# Patient Record
Sex: Female | Born: 1950
Health system: Southern US, Community
[De-identification: ages and names within clinical notes are randomized; demographics above are authoritative.]

## PROBLEM LIST (undated history)

## (undated) ENCOUNTER — Emergency Department (HOSPITAL_COMMUNITY): Admission: EM | Payer: Self-pay | Source: Home / Self Care

## (undated) DIAGNOSIS — I10 Essential (primary) hypertension: Secondary | ICD-10-CM

## (undated) DIAGNOSIS — H9190 Unspecified hearing loss, unspecified ear: Secondary | ICD-10-CM

## (undated) DIAGNOSIS — E559 Vitamin D deficiency, unspecified: Secondary | ICD-10-CM

## (undated) DIAGNOSIS — F32A Depression, unspecified: Secondary | ICD-10-CM

## (undated) DIAGNOSIS — F329 Major depressive disorder, single episode, unspecified: Secondary | ICD-10-CM

## (undated) DIAGNOSIS — C50919 Malignant neoplasm of unspecified site of unspecified female breast: Secondary | ICD-10-CM

## (undated) DIAGNOSIS — Z923 Personal history of irradiation: Secondary | ICD-10-CM

## (undated) DIAGNOSIS — J449 Chronic obstructive pulmonary disease, unspecified: Secondary | ICD-10-CM

## (undated) DIAGNOSIS — F0390 Unspecified dementia without behavioral disturbance: Secondary | ICD-10-CM

## (undated) DIAGNOSIS — F05 Delirium due to known physiological condition: Secondary | ICD-10-CM

## (undated) DIAGNOSIS — E785 Hyperlipidemia, unspecified: Secondary | ICD-10-CM

## (undated) DIAGNOSIS — F419 Anxiety disorder, unspecified: Secondary | ICD-10-CM

## (undated) DIAGNOSIS — E119 Type 2 diabetes mellitus without complications: Secondary | ICD-10-CM

## (undated) HISTORY — PX: INNER EAR SURGERY: SHX679

## (undated) HISTORY — PX: SPINE SURGERY: SHX786

## (undated) HISTORY — DX: Chronic obstructive pulmonary disease, unspecified: J44.9

## (undated) HISTORY — DX: Essential (primary) hypertension: I10

## (undated) HISTORY — DX: Malignant neoplasm of unspecified site of unspecified female breast: C50.919

## (undated) HISTORY — DX: Type 2 diabetes mellitus without complications: E11.9

## (undated) HISTORY — PX: BACK SURGERY: SHX140

## (undated) HISTORY — DX: Vitamin D deficiency, unspecified: E55.9

## (undated) HISTORY — PX: CARPAL TUNNEL RELEASE: SHX101

## (undated) HISTORY — PX: EYE SURGERY: SHX253

## (undated) HISTORY — PX: GALLBLADDER SURGERY: SHX652

## (undated) HISTORY — PX: BREAST SURGERY: SHX581

## (undated) HISTORY — DX: Hyperlipidemia, unspecified: E78.5

## (undated) HISTORY — PX: CERVICAL SPINE SURGERY: SHX589

---

## 2000-09-30 ENCOUNTER — Emergency Department (HOSPITAL_COMMUNITY): Admission: EM | Admit: 2000-09-30 | Discharge: 2000-10-01 | Payer: Self-pay | Admitting: Emergency Medicine

## 2002-01-24 ENCOUNTER — Emergency Department (HOSPITAL_COMMUNITY): Admission: EM | Admit: 2002-01-24 | Discharge: 2002-01-24 | Payer: Self-pay | Admitting: *Deleted

## 2002-02-26 ENCOUNTER — Inpatient Hospital Stay (HOSPITAL_COMMUNITY): Admission: RE | Admit: 2002-02-26 | Discharge: 2002-02-28 | Payer: Self-pay | Admitting: Neurosurgery

## 2002-02-26 ENCOUNTER — Encounter: Payer: Self-pay | Admitting: Neurosurgery

## 2002-09-19 ENCOUNTER — Emergency Department (HOSPITAL_COMMUNITY): Admission: EM | Admit: 2002-09-19 | Discharge: 2002-09-20 | Payer: Self-pay | Admitting: Internal Medicine

## 2002-09-20 ENCOUNTER — Encounter: Payer: Self-pay | Admitting: Internal Medicine

## 2002-09-21 ENCOUNTER — Inpatient Hospital Stay (HOSPITAL_COMMUNITY): Admission: AD | Admit: 2002-09-21 | Discharge: 2002-09-23 | Payer: Self-pay | Admitting: Emergency Medicine

## 2002-09-21 ENCOUNTER — Encounter: Payer: Self-pay | Admitting: *Deleted

## 2002-09-23 ENCOUNTER — Encounter: Payer: Self-pay | Admitting: *Deleted

## 2004-07-03 ENCOUNTER — Encounter: Admission: RE | Admit: 2004-07-03 | Discharge: 2004-07-03 | Payer: Self-pay | Admitting: General Surgery

## 2004-07-05 ENCOUNTER — Emergency Department (HOSPITAL_COMMUNITY): Admission: EM | Admit: 2004-07-05 | Discharge: 2004-07-05 | Payer: Self-pay | Admitting: Family Medicine

## 2004-08-02 ENCOUNTER — Emergency Department (HOSPITAL_COMMUNITY): Admission: EM | Admit: 2004-08-02 | Discharge: 2004-08-02 | Payer: Self-pay | Admitting: Emergency Medicine

## 2005-01-10 ENCOUNTER — Emergency Department (HOSPITAL_COMMUNITY): Admission: EM | Admit: 2005-01-10 | Discharge: 2005-01-10 | Payer: Self-pay | Admitting: Emergency Medicine

## 2005-03-04 ENCOUNTER — Encounter: Admission: RE | Admit: 2005-03-04 | Discharge: 2005-03-04 | Payer: Self-pay | Admitting: General Surgery

## 2005-03-07 ENCOUNTER — Encounter: Admission: RE | Admit: 2005-03-07 | Discharge: 2005-03-07 | Payer: Self-pay | Admitting: General Surgery

## 2005-03-15 ENCOUNTER — Encounter: Admission: RE | Admit: 2005-03-15 | Discharge: 2005-03-15 | Payer: Self-pay | Admitting: General Surgery

## 2005-03-15 ENCOUNTER — Encounter (INDEPENDENT_AMBULATORY_CARE_PROVIDER_SITE_OTHER): Payer: Self-pay | Admitting: *Deleted

## 2005-03-26 ENCOUNTER — Encounter: Admission: RE | Admit: 2005-03-26 | Discharge: 2005-03-26 | Payer: Self-pay | Admitting: General Surgery

## 2005-03-29 ENCOUNTER — Encounter: Admission: RE | Admit: 2005-03-29 | Discharge: 2005-03-29 | Payer: Self-pay | Admitting: General Surgery

## 2005-04-01 ENCOUNTER — Encounter (INDEPENDENT_AMBULATORY_CARE_PROVIDER_SITE_OTHER): Payer: Self-pay | Admitting: General Surgery

## 2005-04-01 ENCOUNTER — Ambulatory Visit (HOSPITAL_COMMUNITY): Admission: RE | Admit: 2005-04-01 | Discharge: 2005-04-01 | Payer: Self-pay | Admitting: General Surgery

## 2005-04-01 ENCOUNTER — Encounter (INDEPENDENT_AMBULATORY_CARE_PROVIDER_SITE_OTHER): Payer: Self-pay | Admitting: *Deleted

## 2005-04-01 ENCOUNTER — Encounter: Admission: RE | Admit: 2005-04-01 | Discharge: 2005-04-01 | Payer: Self-pay | Admitting: General Surgery

## 2005-04-01 ENCOUNTER — Ambulatory Visit (HOSPITAL_BASED_OUTPATIENT_CLINIC_OR_DEPARTMENT_OTHER): Admission: RE | Admit: 2005-04-01 | Discharge: 2005-04-01 | Payer: Self-pay | Admitting: General Surgery

## 2005-04-17 ENCOUNTER — Encounter (INDEPENDENT_AMBULATORY_CARE_PROVIDER_SITE_OTHER): Payer: Self-pay | Admitting: Specialist

## 2005-04-17 ENCOUNTER — Ambulatory Visit (HOSPITAL_BASED_OUTPATIENT_CLINIC_OR_DEPARTMENT_OTHER): Admission: RE | Admit: 2005-04-17 | Discharge: 2005-04-17 | Payer: Self-pay | Admitting: General Surgery

## 2005-04-17 ENCOUNTER — Ambulatory Visit (HOSPITAL_COMMUNITY): Admission: RE | Admit: 2005-04-17 | Discharge: 2005-04-17 | Payer: Self-pay | Admitting: General Surgery

## 2005-04-25 ENCOUNTER — Ambulatory Visit: Payer: Self-pay | Admitting: Oncology

## 2005-05-17 ENCOUNTER — Ambulatory Visit: Admission: RE | Admit: 2005-05-17 | Discharge: 2005-07-26 | Payer: Self-pay | Admitting: Psychiatry

## 2005-05-21 ENCOUNTER — Encounter: Admission: RE | Admit: 2005-05-21 | Discharge: 2005-05-21 | Payer: Self-pay | Admitting: Radiation Oncology

## 2005-12-26 ENCOUNTER — Ambulatory Visit (HOSPITAL_COMMUNITY): Admission: RE | Admit: 2005-12-26 | Discharge: 2005-12-26 | Payer: Self-pay | Admitting: Family Medicine

## 2005-12-26 ENCOUNTER — Encounter: Admission: RE | Admit: 2005-12-26 | Discharge: 2006-01-28 | Payer: Self-pay

## 2006-12-17 ENCOUNTER — Encounter: Admission: RE | Admit: 2006-12-17 | Discharge: 2006-12-17 | Payer: Self-pay | Admitting: Hematology and Oncology

## 2007-05-06 ENCOUNTER — Ambulatory Visit (HOSPITAL_COMMUNITY): Admission: RE | Admit: 2007-05-06 | Discharge: 2007-05-06 | Payer: Self-pay | Admitting: Neurosurgery

## 2007-06-02 ENCOUNTER — Encounter: Admission: RE | Admit: 2007-06-02 | Discharge: 2007-06-02 | Payer: Self-pay | Admitting: Neurosurgery

## 2007-12-18 ENCOUNTER — Encounter: Admission: RE | Admit: 2007-12-18 | Discharge: 2007-12-18 | Payer: Self-pay

## 2008-12-21 ENCOUNTER — Encounter: Admission: RE | Admit: 2008-12-21 | Discharge: 2008-12-21 | Payer: Self-pay | Admitting: Obstetrics and Gynecology

## 2010-01-10 ENCOUNTER — Encounter: Admission: RE | Admit: 2010-01-10 | Discharge: 2010-01-10 | Payer: Self-pay | Admitting: Obstetrics and Gynecology

## 2010-11-04 ENCOUNTER — Encounter: Payer: Self-pay | Admitting: Hematology and Oncology

## 2011-01-30 ENCOUNTER — Other Ambulatory Visit: Payer: Self-pay | Admitting: Obstetrics and Gynecology

## 2011-01-30 DIAGNOSIS — Z853 Personal history of malignant neoplasm of breast: Secondary | ICD-10-CM

## 2011-02-05 ENCOUNTER — Ambulatory Visit
Admission: RE | Admit: 2011-02-05 | Discharge: 2011-02-05 | Disposition: A | Discharge status: Home / Self Care | Payer: PRIVATE HEALTH INSURANCE | Source: Ambulatory Visit | Attending: Obstetrics and Gynecology | Admitting: Obstetrics and Gynecology

## 2011-02-05 DIAGNOSIS — Z853 Personal history of malignant neoplasm of breast: Secondary | ICD-10-CM

## 2011-03-01 NOTE — Op Note (Signed)
Jamie [name], Jamie [name]                 ACCOUNT NO.:  [id number]   MEDICAL RECORD NO.:  [id number]          PATIENT TYPE:  AMB   LOCATION:  DSC                          FACILITY:  [facility]   PHYSICIAN:  Rose Phi. Maple Hudson, M.D.   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   OPERATION:  Left partial mastectomy with needle localization and specimen  for mammogram.   SURGEON:  Francina Ames, M.D.   ANESTHESIA:  General.   OPERATIVE PROCEDURE:  Prior to coming to the operating room, two localizing  wires, one anterior and one posterior, had been placed to identify the deep  and anterior margins of the calcifications which had been DCIS on biopsy.   After suitable general anesthesia was induced, the patient was placed in the  supine position and the left breast prepped and draped in the usual fashion.  A curved incision between the two localizing wires, which came from a  lateral position, for the left breast was then outlined and then I exposed  both wires into the incision and with retraction excised the wires and  surrounding tissue.  Specimen was oriented for the pathologist.  It was  submitted to the radiologist for specimen mammogram.   The specimen mammogram confirmed the removal of the microcalcifications.   With good hemostasis, incision was closed in a single layer with  subcuticular 4-0 Monocryl and Steri-Strips.  Dressing applied.  The patient  was transferred to the recovery room in satisfactory condition having  tolerated the procedure well.       PRY/MEDQ  D:  04/01/2005  T:  04/01/2005  Job:  [id number]

## 2011-03-01 NOTE — H&P (Signed)
NAME:  Jamie Jamie [name], Jamie Jamie [name]                           ACCOUNT NO.:  [id number]   MEDICAL RECORD NO.:  [id number]                   PATIENT TYPE:  INP   LOCATION:  A321                                 FACILITY:  [facility]   PHYSICIAN:  Sarita Bottom, M.D.                  DATE OF BIRTH:  Aug 24, 1951   DATE OF ADMISSION:  09/21/2002  DATE OF DISCHARGE:                                HISTORY & PHYSICAL   PRIMARY PHYSICIAN:  Paulita Cradle, M.D., [facility]   CHIEF COMPLAINT:  My blood sugar is high.   HISTORY OF PRESENT ILLNESS:  The patient is Jamie [name] 60 year old lady with Jamie [name]  history of hypertension and diabetes mellitus.  She was recently started on  insulin about Jamie [name] week ago.  She said she was apparently well until three days  ago when she developed dysuria and also low-back pain.  She came to the  emergency room and she was given antibiotics for possible urinary tract  infection and also given Vicodin for pain and then discharged home; however,  she was supposed to see her primary M.D. today in his office and his blood  sugar was noted to be very high and his urine was also positive for ketones,  so the patient was therefore referred to [facility] for evaluation  for diabetic ketoacidosis.   REVIEW OF SYSTEMS:  GENERAL:  She admits to weakness.  Denies any fever.  RESPIRATORY:  She had noticed Jamie [name] cough productive of yellowish to whitish  phlegm.  CV:  Denies any chest pain or palpitations.  ABDOMEN:  Admits to  nausea and vomiting this morning.  CNS:  Denies any dizziness.  EXTREMITIES:  Denies any edema.  BACK:  Complains of severe back pain.   PAST MEDICAL HISTORY:  Significant for hypertension and diabetes mellitus.  She was started on insulin over Jamie [name] week ago, Jamie [name] few weeks ago.  She also has Jamie [name]  history of bursitis of the right shoulder involving the skin on the cervical  disk surgery.   MEDICATIONS:  1. Norvasc.  She does not know the dosage.  2. Cardura.  3.  Novolin 70/30 ten units three times Jamie [name] day.   ALLERGIES:  She has no known drug allergies.   FAMILY HISTORY:  Her mother has diabetes mellitus.   SOCIAL HISTORY:  She is married.  She has one child.  She smokes about one  pack of cigarettes per day.   PHYSICAL EXAMINATION:  VITAL SIGNS:  Blood glucose on examination is 366.  Blood pressure 135/79, heart rate 99, temperature 97.3.  GENERAL:  Jamie [name] middle-aged lady not in any apparent distress.  HEENT:  She is not pale.  She is anicteric.  Pupils are equal and reactive  to light and accommodation.  She has moist oral mucosa.  NECK:  Supple.  No lymphadenopathy, no thyromegaly.  CHEST:  She has  Jamie [name] few expiratory wheezes.  No crepitations were heard.  CARDIOVASCULAR:  Heart sounds 1 and 2 are normal with regular rhythm and  rate.  No murmurs were appreciated.  ABDOMEN:  Soft, nontender.  Bowel sounds were present.  No masses or  organomegaly.  CENTRAL NERVOUS SYSTEM:  She is alert and oriented x3.  She has no gross  neurological deficits.  EXTREMITIES:  She has no pedal edema.  BACK:  She has tenderness in the right lumbosacral region.   LABORATORY DATA:  Her accompanying lab tests show Jamie [name] WBC of 10.1, neutrophil  count of 76%, lymphocytes of 1.8, hemoglobin of 11.8, hematocrit of 36.6,  MCV of 85.7, and platelet count of 431.  The UA was positive for glucose and  ketones were positive by dipstick.  __________ was done when she came to the  emergency room on December 9.  It showed Jamie [name] sodium of 131, potassium of 4.0,  chloride of 100, CO2 of 31, BUN of 12, creatinine 11.6, glucose of 301, and  calcium of 8.7.  WBC was 10.8, neutrophil count was 77%, hematocrit of 35%.  The UA was negative for LE and negative for nitrites.   ASSESSMENT:  1. Uncontrolled diabetes mellitus to rule out ketoacidosis.  The patient     will be admitted.  She will be put on intravenous normal saline at 100 cc     per hour.  Jamie [name] chest x-ray, Jamie [name] BMET, Jamie [name] CBC, and UA will  be ordered.  The     patient will be resumed on NPH insulin at 20 units in the morning and 10     units in the evening.  She will also be put on Jamie [name] sliding scale insulin     q.4h. with regular insulin coverage.  She will be put on an 1800 ADA diet     and Jamie [name] hemoglobin A1c will be checked.  2. For the hypertension, the patient will resume her Norvasc at 10 mg p.o.     q.d.  She also will be put on Jamie [name] 2-g sodium diet and her blood pressure     will be monitored regularly.  3. For the low back pain, the patient will be given Demerol intravenously     q.6h. p.r.n.  4. For the nausea and vomiting, the patient will be given Phenergan 25 mg     intravenously q.6h. p.r.n.  5. For the symptoms of weakness and Jamie [name] cough probably due to Jamie [name] viral     syndrome, the patient will be given Tylenol 650 mg q.6h. p.r.n.   Further management and workup will depend on the patient's clinical course.                                               Sarita Bottom, M.D.    DW/MEDQ  D:  09/21/2002  T:  09/21/2002  Job:  [id number]

## 2011-03-01 NOTE — H&P (Signed)
[place]  Patient:    JAIYA, MOORADIAN Visit Number: [id number] MRN: [id number]          Service Type: SUR Location: 3000 3009 01 Attending Physician:  Danella Penton Dictated by:   Tanya Nones. Jeral Fruit, M.D. Admit Date:  02/26/2002                           History and Physical  HISTORY OF PRESENT ILLNESS:  Mrs. Nott is a lady who was seen by me as an emergency in my office because of neck pain with radiation down to both lower extremities and loss of control of both legs.  The patient has had neck pain for several days and she had been treated for some muscle sprain.  Now she realized that she is having pain going to the left upper extremity and when she looks up to the ceiling she feels electricity going down to both legs with numbness and tingling sensation.  The patient had an MRI and was sent to [place] to be seen.  I saw her yesterday afternoon with her husband.  She was crying because of the pain.  PAST MEDICAL HISTORY:  Surgery in both ears.  She is bilaterally deaf.  She also had cholecystectomy and C section.  MEDICATIONS:  She is taking medications for high blood pressure and diabetes.  SOCIAL HISTORY:  Does not drink and smokes a pack a day.  FAMILY HISTORY:  Mother is 28 with hypertension, stroke, and diabetes.  REVIEW OF SYSTEMS:  High blood pressure, diabetes, and bilateral hearing loss.  PHYSICAL EXAMINATION:  GENERAL:  The patient came with her husband.  She has difficulty walking because of the pain.  HEENT:  Normal.  NECK:  The neck flexed to 10 degrees.  She has minimal movement of the neck.  LUNGS:  There are some rhonchi bilaterally.  HEART:  Heart sounds normal.  ABDOMEN:  Normal.  EXTREMITIES:  Normal pulses.  NEUROLOGIC:  Mental status normal.  Cranial nerves normal.  Strength shows she has weakening of the triceps and the rest are normal.  Sensation normal. Reflexes are 2+ with a Babinski in the right  side.  Coordination shows she is unable to walk in a straight line.  LABORATORY DATA:  The MRI and the cervical spine were seen.  The cervical spine shows a step-off between 5-6 but no movement between flexion and extension.  The MRI showed multiple levels of herniated disk, but at the level 6-7 she has a high, large herniated disk with compression of the spinal cord and already changes in the cord itself.  CLINICAL IMPRESSION: 1. Cervical myelopathy secondary to a large herniated disk at the level of    C6-C7. 2. Spondylolisthesis between 5-6 which are normal between flexion-extension. 3. Multiple levels of herniated disk at the level of 3-4, 4-5, 5-6.  RECOMMENDATION:  She is going to be admitted for a one-level anterior cervical diskectomy.  She knows that there is always a high possibility that the other disks will get worse and she will require surgery.  Right now, we know that she is having a quite severe case of myelopathy; decompression of the C6-7 will be the way to help her.  The risks were explained and include paralysis, no improvement whatsoever, need of further surgery, stroke, damage to the vocal cords, damage to the esophagus, and all the risks associated with her diabetes and heavy smoking. Dictated by:  Tanya Nones. Jeral Fruit, M.D. Attending Physician:  Danella Penton DD:  02/26/02 TD:  02/27/02 Job: [id number]

## 2011-03-01 NOTE — Op Note (Signed)
[place]. [facility]  Patient:    Jamie [name], Jamie [name] Visit Number: [id number] MRN: [id number]          Service Type: SUR Location: 3000 3009 01 Attending Physician:  Danella Penton Dictated by:   Tanya Nones. Jeral Fruit, M.D. Proc. Date: 02/26/02 Admit Date:  02/26/2002 Discharge Date: 02/28/2002                             Operative Report  PREOPERATIVE DIAGNOSES: 1. C6-7 herniated disk with myelopathy. 2. Multiple herniated disks, C3-4, C4-5, C5-6.  POSTOPERATIVE DIAGNOSES: 1. C6-7 herniated disk with myelopathy. 2. Multiple herniated disks, C3-4, C4-5, C5-6.  PROCEDURES: 1. Anterior C6-7 diskectomy. 2. Decompression of the spinal cord. 3. Removal of large fragment going to the body of C6. 4. Foraminotomy. 5. Bone bank graft. 6. Plate. 7. Microscope.  SURGEON:  Tanya Nones. Jeral Fruit, M.D.  ASSISTANT:  Stefani Dama, M.D.  CLINICAL HISTORY:  The patient was admitted with neck pain, weakness in the arms, and difficulty with balance.  She has been reflexive with Babinski. X-rays showed multiple-level cervical disease with a large herniated disk at the level of C6-7 with changes of the spinal cord itself.  Surgery was advised, and the risks were explained in the history and physical.  DESCRIPTION OF PROCEDURE:  The patient was taken to the OR and intubation, which was done in a neutral position.  The left side of the neck was prepped with Betadine.  No traction was used.  No pillow under the shoulder.  Then a transverse incision through the skin, platysma, and down to the cervical spine was done.  X-rays showed that indeed we were at the level of C6-7.  Then we brought the microscope into the area and we opened the anterior ligament.  We removed quite a bit of degenerative disk.  The disk was extending beyond about 4 or 5 mm beyond the posterior aspect of the body of 6 and 7.  then we found the posterior ligament, which was opened.  Removal of a  large herniated disk was accomplished.  At the level of C6 we found a large piece, which was into the body of C6.  We had to undermine the body of C6, and we were able to remove a large piece of disk and end plate which was compressing the upper part of the disk space.  Having good decompression, we found that there was already some reddish discoloration in the left paramedian aspect of the dura mater.  Having removed the end plates with the drill, we put a piece of graft of 7 mm height.  This was followed by a plate using four screws.  Lateral C-spine showed good position of the graft and the plate.  From then on the area was irrigated and closed with Vicryl and a Steri-Strip. Dictated by:   Tanya Nones. Jeral Fruit, M.D. Attending Physician:  Danella Penton DD:  02/26/02 TD:  03/01/02 Job: [id number]

## 2011-03-01 NOTE — Discharge Summary (Signed)
NAME:  Jamie Jamie [name], Jamie Jamie [name]                           ACCOUNT NO.:  [id number]   MEDICAL RECORD NO.:  [id number]                   PATIENT TYPE:  INP   LOCATION:  A321                                 FACILITY:  [facility]   PHYSICIAN:  Sarita Bottom, M.D.                  DATE OF BIRTH:  27-Apr-1951   DATE OF ADMISSION:  09/21/2002  DATE OF DISCHARGE:  09/23/2002                                 DISCHARGE SUMMARY   HISTORY OF PRESENT ILLNESS:  The patient is Jamie [name] 60 year old lady with Jamie [name]  history of hypertension and diabetes mellitus.  She was referred by her  primary M.D. for management of diabetes ketoacidosis on the ninth of this  month because the patient's primary M.D. detected Jamie [name] high blood glucose and  ketonuria on clinic visit.  The patient also complained of Jamie [name] cough,  weakness, and back pain on admission.   PHYSICAL EXAMINATION:  GENERAL:  Middle aged lady not in any distress.  She  was not pale.  She was anicteric.  CHEST:  Clear.  HEART:  Sounds 1 and 2 were regular rhythm and rate.  BACK:  She had tenderness in the lumbosacral region.   LABORATORIES:  Her admission laboratory tests showed Jamie [name] sodium of 132,  potassium of 3.0, chloride of 100, CO2 of 25, BUN of 11, creatinine of 0.7,  glucose of 316 with an anion gap of 7.  Hemoglobin A1C was 9.1.   HOSPITAL COURSE:  The patient was admitted with an impression of  uncontrolled diabetes mellitus and she was put on insulin NPH which was  later titrated up to 30 units in the morning and 25 units in the evening.  She was put on Accu-Chek q.6h. with regular insulin coverage.  For the  patient's back pain patient was given IV Demerol p.r.n. 25 mg and she  complained of persistent back pain while she was on the ward so Jamie [name]  lumbosacral x-ray was done which came out as negative.  For the patient's  symptoms of weakness and cough which was probably attributed to viral  syndrome, patient was given Tylenol 650 mg p.r.n. q.6h.  The patient was  seen on  ward rounds today and it has been explained to her that the  lumbosacral x-ray is negative and she agrees to be discharged to go home to  follow up with the primary M.D.   DISCHARGE DIAGNOSES:  Uncontrolled diabetes mellitus which is now being  controlled.  The patient is to be started on NPH 30 units in the morning and  25 units in the evening.  For the patient's hypertension she is being  started on Norvasc 10 mg p.o. and Vasotec 5 mg p.o.  For the low back pain  patient will be put on Jamie [name] short course of Demerol 25 mg p.o. q.6h. for three  days.  For the viral syndrome patient will have Tylenol  650 mg q.6h. p.r.n.   CONDITION ON DISCHARGE:  Stable.    DISPOSITION:  The patient will be discharged home to follow up with primary  M.D., Dr. Birdena Jubilee at [facility].                                               Sarita Bottom, M.D.    DW/MEDQ  D:  09/23/2002  T:  09/24/2002  Job:  [id number]   cc:   Birdena Jubilee, M.D.

## 2011-03-01 NOTE — Op Note (Signed)
NAMESILVANA, Jamie [name]                 ACCOUNT NO.:  [id number]   MEDICAL RECORD NO.:  [id number]          PATIENT TYPE:  AMB   LOCATION:  DSC                          FACILITY:  [facility]   PHYSICIAN:  Rose Phi. Young, M.D.   DATE OF BIRTH:  05-31-1951   DATE OF PROCEDURE:  04/17/2005  DATE OF DISCHARGE:                                 OPERATIVE REPORT   PREOPERATIVE DIAGNOSIS:  Ductal carcinoma in situ of the left breast.   POSTOPERATIVE DIAGNOSIS:  Ductal carcinoma in situ of the left breast.   OPERATION:  Re-excision of left lumpectomy site for margin control.   SURGEON:  Dr. Francina Ames.   ANESTHESIA:  General.   OPERATIVE PROCEDURE:  This patient had undergone a previous left partial  mastectomy for extensive DCIS measuring 3 cm or so of the left breast. We  had a very close margin anteriorly and medially and she is scheduled for re-  excision.   After suitable general anesthesia was induced, the patient was placed in the  supine position with the arms extended on the arm board and the left breast  prepped and draped in the usual fashion.   The curved incision of the previous lumpectomy was in the upper outer  quadrant. I then outlined an incision to excise the scar in the superficial  margin which we then did. This entered the seroma cavity which I emptied. I  then excised the medial margin which was the other one in question. The deep  margin was also closed but we were on the fascia at the time.   We then thoroughly irrigated the cavity with saline and obtained good  hemostasis. I then closed it in a single layer of interrupted 4-0 nylon  sutures and then a dressing was applied and then an Ace wrap placed around  her.   She was then transferred to the recovery room in satisfactory condition  having tolerated the procedure well.       PRY/MEDQ  D:  04/17/2005  T:  04/17/2005  Job:  [id number]

## 2012-01-17 ENCOUNTER — Other Ambulatory Visit: Payer: Self-pay | Admitting: Family Medicine

## 2012-01-17 DIAGNOSIS — Z853 Personal history of malignant neoplasm of breast: Secondary | ICD-10-CM

## 2012-02-07 ENCOUNTER — Ambulatory Visit
Admission: RE | Admit: 2012-02-07 | Discharge: 2012-02-07 | Disposition: A | Discharge status: Home / Self Care | Payer: PRIVATE HEALTH INSURANCE | Source: Ambulatory Visit | Attending: Family Medicine | Admitting: Family Medicine

## 2012-02-07 DIAGNOSIS — Z853 Personal history of malignant neoplasm of breast: Secondary | ICD-10-CM

## 2012-08-26 ENCOUNTER — Other Ambulatory Visit: Payer: Self-pay | Admitting: Neurosurgery

## 2012-08-26 DIAGNOSIS — M5410 Radiculopathy, site unspecified: Secondary | ICD-10-CM

## 2012-08-26 DIAGNOSIS — M549 Dorsalgia, unspecified: Secondary | ICD-10-CM

## 2012-08-27 ENCOUNTER — Ambulatory Visit
Admission: RE | Admit: 2012-08-27 | Discharge: 2012-08-27 | Disposition: A | Discharge status: Home / Self Care | Payer: PRIVATE HEALTH INSURANCE | Source: Ambulatory Visit | Attending: Neurosurgery | Admitting: Neurosurgery

## 2012-08-27 ENCOUNTER — Other Ambulatory Visit: Payer: Self-pay | Admitting: Neurosurgery

## 2012-08-27 DIAGNOSIS — M549 Dorsalgia, unspecified: Secondary | ICD-10-CM

## 2012-08-27 DIAGNOSIS — M5410 Radiculopathy, site unspecified: Secondary | ICD-10-CM

## 2012-08-27 MED ORDER — IOHEXOL 180 MG/ML  SOLN
1.0000 mL | Freq: Once | INTRAMUSCULAR | Status: AC | PRN
  Administered 2012-08-27: 1 mL via EPIDURAL

## 2012-08-27 MED ORDER — METHYLPREDNISOLONE ACETATE 40 MG/ML INJ SUSP (RADIOLOG
120.0000 mg | Freq: Once | INTRAMUSCULAR | Status: AC
  Administered 2012-08-27: 120 mg via EPIDURAL

## 2012-12-28 ENCOUNTER — Other Ambulatory Visit: Payer: Self-pay | Admitting: Family Medicine

## 2012-12-28 DIAGNOSIS — R059 Cough, unspecified: Secondary | ICD-10-CM

## 2012-12-28 DIAGNOSIS — R05 Cough: Secondary | ICD-10-CM

## 2012-12-29 ENCOUNTER — Other Ambulatory Visit (HOSPITAL_COMMUNITY): Payer: PRIVATE HEALTH INSURANCE

## 2012-12-30 ENCOUNTER — Ambulatory Visit (HOSPITAL_COMMUNITY)
Admission: RE | Admit: 2012-12-30 | Discharge: 2012-12-30 | Disposition: A | Discharge status: Home / Self Care | Payer: Medicare Other | Source: Ambulatory Visit | Attending: Family Medicine | Admitting: Family Medicine

## 2012-12-30 DIAGNOSIS — F17200 Nicotine dependence, unspecified, uncomplicated: Secondary | ICD-10-CM | POA: Insufficient documentation

## 2012-12-30 DIAGNOSIS — R059 Cough, unspecified: Secondary | ICD-10-CM

## 2012-12-30 DIAGNOSIS — R05 Cough: Secondary | ICD-10-CM

## 2012-12-30 DIAGNOSIS — Z853 Personal history of malignant neoplasm of breast: Secondary | ICD-10-CM | POA: Insufficient documentation

## 2012-12-30 DIAGNOSIS — R911 Solitary pulmonary nodule: Secondary | ICD-10-CM | POA: Insufficient documentation

## 2012-12-31 ENCOUNTER — Telehealth: Payer: Self-pay | Admitting: Family Medicine

## 2012-12-31 ENCOUNTER — Other Ambulatory Visit: Payer: Self-pay | Admitting: Family Medicine

## 2012-12-31 DIAGNOSIS — R918 Other nonspecific abnormal finding of lung field: Secondary | ICD-10-CM

## 2012-12-31 NOTE — Telephone Encounter (Signed)
Patient is to be referred to pulmonary.

## 2012-12-31 NOTE — Progress Notes (Signed)
Called patient and informed her of CT results with her, findings of pulmonary nodules. Will refer to pulmonary for consultation in view of her being a smoker of many years and having a chronic cough.

## 2013-01-15 ENCOUNTER — Institutional Professional Consult (permissible substitution): Payer: Medicare Other | Admitting: Internal Medicine

## 2013-01-18 ENCOUNTER — Telehealth: Payer: Self-pay | Admitting: Family Medicine

## 2013-01-19 NOTE — Telephone Encounter (Signed)
Form signed by MMM today and faxed to [facility]

## 2013-02-01 ENCOUNTER — Encounter: Payer: Self-pay | Admitting: *Deleted

## 2013-02-02 ENCOUNTER — Encounter: Payer: Self-pay | Admitting: Internal Medicine

## 2013-02-02 ENCOUNTER — Ambulatory Visit (INDEPENDENT_AMBULATORY_CARE_PROVIDER_SITE_OTHER): Payer: Medicare Other | Admitting: Internal Medicine

## 2013-02-02 VITALS — BP 128/64 | HR 90 | Temp 97.9°F | Ht 62.0 in | Wt 117.0 lb

## 2013-02-02 DIAGNOSIS — R062 Wheezing: Secondary | ICD-10-CM

## 2013-02-02 DIAGNOSIS — J449 Chronic obstructive pulmonary disease, unspecified: Secondary | ICD-10-CM

## 2013-02-02 DIAGNOSIS — F17200 Nicotine dependence, unspecified, uncomplicated: Secondary | ICD-10-CM

## 2013-02-02 DIAGNOSIS — R911 Solitary pulmonary nodule: Secondary | ICD-10-CM

## 2013-02-02 MED ORDER — MOMETASONE FURO-FORMOTEROL FUM 100-5 MCG/ACT IN AERO: 2.0000 | INHALATION_SPRAY | Freq: Two times a day (BID) | RESPIRATORY_TRACT | Status: DC

## 2013-02-02 MED ORDER — PREDNISONE 10 MG PO TABS: ORAL_TABLET | ORAL | Status: DC

## 2013-02-02 MED ORDER — TIOTROPIUM BROMIDE MONOHYDRATE 18 MCG IN CAPS: 18.0000 ug | ORAL_CAPSULE | Freq: Every day | RESPIRATORY_TRACT | Status: DC

## 2013-02-02 NOTE — Progress Notes (Signed)
Subjective:    Patient ID: Jamie [name], female    DOB: 1950-11-12, 62 y.o.   MRN: [id number]  PCP is Redmond Baseman, MD Body mass index is 21.39 kg/(m^2).  reports that she has been smoking Cigarettes.  She has a 45 pack-year smoking history. She does not have any smokeless tobacco history on file.   HPI IOV 02/02/13   62 year old smoker with 45 pack smoking history and left breast cancer survivor [status post lumpectomy left breast followed by radiation therapy in 2006 and since then in complete remission with unclear followup details]  She reported to primary care physician Dr. Modesto Charon for routine physical over a month ago. At this point in time she complained of one year of insidious onset of chronic stable dyspnea, cough and associated sputum production that is white in color and wheezing. Symptoms were stable but moderate to severe in intensity and brought on with exertion for 1 flight of stairs and relieved by rest. There is no associated hemoptysis, weight loss, edema, orthopnea, paroxysmal nocturnal dyspnea or chest pain. COPD cat score is extremely high symptom burden at 31 [see below]. According to the history there was audible wheeze and this resulted in a CT scan of the chest that showed new-onset pulmonary nodules [although no prior CT for comparison] on 12/30/2012. The largest nodule is 8 mm in size in the right upper lobe anterior segment close to the midline and just behind the sternum.  she's been referred to pulmonary  Spirometry in our office 02/02/2013: Show very severe obstruction with FEV1 0.77 L/34% predicted and a ratio of 43.  CAT COPD Symptom & Quality of Life Score (GSK trademark) 0 is no burden. 5 is highest burden 02/02/2013 Baseline Untreated   Never Cough -> Cough all the time 5  No phlegm in chest -> Chest is full of phlegm 5  No chest tightness -> Chest feels very tight 5  No dyspnea for 1 flight stairs/hill -> Very dyspneic for 1 flight of stairs 3  No  limitations for ADL at home -> Very limited with ADL at home 0  Confident leaving home -> Not at all confident leaving home 5  Sleep soundly -> Do not sleep soundly because of lung condition 5  Lots of Energy -> No energy at all 3  TOTAL Score (max 40)  31       CT chest 12/30/12  CT CHEST WITHOUT CONTRAST  Technique: Multidetector CT imaging of the chest was performed  following the standard protocol without IV contrast.  Comparison: Chest radiographs dated 12/07/2012  Findings: Scattered bilateral pulmonary nodules, including:  --8 x 5 mm nodule in the medial right upper lobe (series 3/image  19)  --3 mm nodule in the left upper lobe (series 3/image 20)  --4 x 5 mm nodule in the right lower lobe (series 3/image 47)  Additional mild nodularity with scattered areas of bronchiectasis  and tree-in-bud opacity throughout all lobes.  Mild centrilobular emphysematous changes. Radiation changes in the  anterior left upper lobe. No pleural effusion or pneumothorax.  Visualized thyroid is unremarkable.  The heart is normal in size. No pericardial effusion. Coronary  atherosclerosis. Atherosclerotic calcifications of the aortic  arch.  No suspicious mediastinal or axillary lymphadenopathy.  Visualized upper abdomen is grossly unremarkable.  Mild degenerative changes of the visualized thoracolumbar spine.  Cervical spine fixation hardware.    IMPRESSION:  Scattered small bilateral pulmonary nodules measuring up to 8 x 5  mm in the right upper  lobe.  Given high risk for primary bronchogenic neoplasm, initial follow-  up CT chest is suggested in 3-6 months.  This recommendation follows the consensus statement: Guidelines for  Management of Small Pulmonary Nodules Detected on CT Scans: A  Statement from the Fleischner Society as published in Radiology  2005; 237:395-400.  Original Report Authenticated By: Charline Bills, M.D.    Past Medical History  Diagnosis Date  . HTN  (hypertension)   . Diabetes   . Breast cancer      Family History  Problem Relation Age of Onset  . Heart attack Mother   . Stroke Mother   . Stroke Sister   . Diabetes      sister x3, and mother  . Lung disease Father     worked in Primary school teacher     History   Social History  . Marital Status: Married    Spouse Name: N/A    Number of Children: N/A  . Years of Education: N/A   Occupational History  . Not on file.   Social History Main Topics  . Smoking status: Current Every Day Smoker -- 1.00 packs/day for 45 years    Types: Cigarettes  . Smokeless tobacco: Not on file  . Alcohol Use: No  . Drug Use: No  . Sexually Active: Not on file   Other Topics Concern  . Not on file   Social History Narrative  . No narrative on file    No outpatient prescriptions prior to visit.   No facility-administered medications prior to visit.       Review of Systems  Constitutional: Negative for fever and unexpected weight change.  HENT: Positive for ear pain. Negative for nosebleeds, congestion, sore throat, rhinorrhea, sneezing, trouble swallowing, dental problem, postnasal drip and sinus pressure.   Eyes: Negative for redness and itching.  Respiratory: Positive for cough and shortness of breath. Negative for chest tightness and wheezing.   Cardiovascular: Negative for palpitations and leg swelling.  Gastrointestinal: Negative for nausea and vomiting.  Genitourinary: Negative for dysuria.  Musculoskeletal: Negative for joint swelling.  Skin: Negative for rash.  Neurological: Negative for headaches.  Hematological: Does not bruise/bleed easily.  Psychiatric/Behavioral: Positive for dysphoric mood. The patient is nervous/anxious.        Objective:   Physical Exam  Vitals reviewed. Constitutional: She is oriented to person, place, and time. She appears well-developed and well-nourished. No distress.  Body mass index is 21.39 kg/(m^2).   HENT:  Head: Normocephalic and  atraumatic.  Right Ear: External ear normal.  Left Ear: External ear normal.  Mouth/Throat: Oropharynx is clear and moist. No oropharyngeal exudate.  Scar present left side of neck  Eyes: Conjunctivae and EOM are normal. Pupils are equal, round, and reactive to light. Right eye exhibits no discharge. Left eye exhibits no discharge. No scleral icterus.  Neck: Normal range of motion. Neck supple. No JVD present. No tracheal deviation present. No thyromegaly present.  Cardiovascular: Normal rate, regular rhythm, normal heart sounds and intact distal pulses.  Exam reveals no gallop and no friction rub.   No murmur heard. Pulmonary/Chest: Effort normal. No respiratory distress. She has wheezes. She has no rales. She exhibits no tenderness.  Has audible wheeze extensively bilaterally and posterior breathing but no accessory muscle use  Abdominal: Soft. Bowel sounds are normal. She exhibits no distension and no mass. There is no tenderness. There is no rebound and no guarding.  Musculoskeletal: Normal range of motion. She exhibits no edema  and no tenderness.  Lymphadenopathy:    She has no cervical adenopathy.  Neurological: She is alert and oriented to person, place, and time. She has normal reflexes. No cranial nerve deficit. She exhibits normal muscle tone. Coordination normal.  Skin: Skin is warm and dry. No rash noted. She is not diaphoretic. No erythema. No pallor.  Psychiatric: She has a normal mood and affect. Her behavior is normal. Judgment and thought content normal.          Assessment & Plan:

## 2013-02-02 NOTE — Patient Instructions (Addendum)
#  COPD  - you appear to have severe to very severe COPD Take prednisone 40 mg daily x 2 days, then 20mg  daily x 2 days, then 10mg  daily x 2 days, then 5mg  daily x 2 days and stop - Please start spiriva 1 puff daily - take sample, script and show technique - Please start Dulera 100/52 puff 2 times daily - Please start albuterol as needed 2 puff - At followup we'll do full pulmonary function test and discuss the role of pulmonary rehabilitation and alpha 1 genetic test  #Smoking - You absolutely need to quit smoking  # lung nodule - This might or might not be lung cancer, cannot tell for sure -Do CT scan of the chest without contrast in 3 months - We will discuss blood testing for lung cancer proteins at the time of followup both oncology immune and INDI (she has deferred testing at this visit 02/02/13)  #Followup - 1 month with full pulmonary function test and walk test in the office

## 2013-02-04 ENCOUNTER — Telehealth: Payer: Self-pay | Admitting: Family Medicine

## 2013-02-04 DIAGNOSIS — F17200 Nicotine dependence, unspecified, uncomplicated: Secondary | ICD-10-CM | POA: Insufficient documentation

## 2013-02-04 DIAGNOSIS — R911 Solitary pulmonary nodule: Secondary | ICD-10-CM | POA: Insufficient documentation

## 2013-02-04 DIAGNOSIS — J449 Chronic obstructive pulmonary disease, unspecified: Secondary | ICD-10-CM | POA: Insufficient documentation

## 2013-02-04 NOTE — Assessment & Plan Note (Signed)
I have explained to her the hazards of smoking in terms of lung cancer risk of progression of COPD cardiac risk.  3 minutes face-to-face counseling about the importance of needing to quit smoking

## 2013-02-04 NOTE — Assessment & Plan Note (Signed)
 #   lung nodule - 8 mm right upper lobe - This might or might not be lung cancer, cannot tell for sure -Do CT scan of the chest without contrast in 3 months - We will discuss blood testing for lung cancer proteins at the time of followup both oncology immune and INDI (she has deferred testing at this visit 02/02/13)

## 2013-02-04 NOTE — Assessment & Plan Note (Signed)
#  COPD  - you appear to have severe to very severe COPD Take prednisone 40 mg daily x 2 days, then 20mg  daily x 2 days, then 10mg  daily x 2 days, then 5mg  daily x 2 days and stop - Please start spiriva 1 puff daily - take sample, script and show technique - Please start Dulera 100/52 puff 2 times daily - Please start albuterol as needed 2 puff - At followup we'll do full pulmonary function test and discuss the role of pulmonary rehabilitation and alpha 1 genetic test   #Followup - 1 month with full pulmonary function test and walk test in the office

## 2013-02-05 NOTE — Telephone Encounter (Signed)
waiting on fax from company

## 2013-02-08 ENCOUNTER — Telehealth: Payer: Self-pay | Admitting: Family Medicine

## 2013-02-10 NOTE — Telephone Encounter (Signed)
RECEIVED FORM AND WILL HAVE DR Christell Constant SIGN AND FAX BACK

## 2013-02-10 NOTE — Telephone Encounter (Signed)
RECEIVED FORM AND VERIFIED SYRINGES WITH PT

## 2013-03-11 ENCOUNTER — Ambulatory Visit: Payer: Self-pay | Admitting: Family Medicine

## 2013-03-29 ENCOUNTER — Encounter: Payer: Self-pay | Admitting: Internal Medicine

## 2013-03-29 ENCOUNTER — Ambulatory Visit (INDEPENDENT_AMBULATORY_CARE_PROVIDER_SITE_OTHER): Payer: Medicare Other | Admitting: Internal Medicine

## 2013-03-29 ENCOUNTER — Other Ambulatory Visit: Payer: Medicare Other

## 2013-03-29 ENCOUNTER — Other Ambulatory Visit: Payer: Self-pay | Admitting: Nurse Practitioner

## 2013-03-29 ENCOUNTER — Telehealth: Payer: Self-pay | Admitting: Internal Medicine

## 2013-03-29 VITALS — BP 158/82 | HR 82 | Temp 98.3°F

## 2013-03-29 DIAGNOSIS — R911 Solitary pulmonary nodule: Secondary | ICD-10-CM

## 2013-03-29 DIAGNOSIS — R062 Wheezing: Secondary | ICD-10-CM

## 2013-03-29 DIAGNOSIS — B370 Candidal stomatitis: Secondary | ICD-10-CM

## 2013-03-29 DIAGNOSIS — J449 Chronic obstructive pulmonary disease, unspecified: Secondary | ICD-10-CM

## 2013-03-29 DIAGNOSIS — F17200 Nicotine dependence, unspecified, uncomplicated: Secondary | ICD-10-CM

## 2013-03-29 LAB — PULMONARY FUNCTION TEST

## 2013-03-29 MED ORDER — NYSTATIN 100000 UNIT/ML MT SUSP: OROMUCOSAL | Status: DC

## 2013-03-29 MED ORDER — BUDESONIDE-FORMOTEROL FUMARATE 160-4.5 MCG/ACT IN AERO: 2.0000 | INHALATION_SPRAY | Freq: Two times a day (BID) | RESPIRATORY_TRACT | Status: DC

## 2013-03-29 MED ORDER — AEROCHAMBER MV MISC: Status: DC

## 2013-03-29 NOTE — Patient Instructions (Addendum)
#  COPD - continue spiriva  - stop dulera due to thrush and cost - Please start symbicort 160/4.5 2 puff twice daily   - use spacer  - rinse mouht with listerine after use - USe albuterol as needed - REferring you to pulmonary rehabilitation at [place] or [place] - Check blood test for alpha  1 genetic test today  #Oral thrush - For Oral thrush: Take Suspension (swish and swallow): 500,000 units 4 times/day for 5 days; swish in the mouth and retain for as long as possible (several minutes) before swallowing  #Lung nodule = have your CT chest end July 2014  #Followupo Early August 2014 after CT chest COPD CAT score at followup

## 2013-03-29 NOTE — Progress Notes (Signed)
PFT done today. 

## 2013-03-29 NOTE — Progress Notes (Signed)
Subjective:    Patient ID: Jamie [name], female    DOB: [birth year], 62 y.o.   MRN: [id number]  HPI  IOV 02/02/13   62 year old smoker with 45 pack smoking history and left breast cancer survivor [status post lumpectomy left breast followed by radiation therapy in 2006 and since then in complete remission with unclear followup details]  She reported to primary care physician Dr. Modesto Charon for routine physical over a month ago. At this point in time she complained of one year of insidious onset of chronic stable dyspnea, cough and associated sputum production that is white in color and wheezing. Symptoms were stable but moderate to severe in intensity and brought on with exertion for 1 flight of stairs and relieved by rest. There is no associated hemoptysis, weight loss, edema, orthopnea, paroxysmal nocturnal dyspnea or chest pain. COPD cat score is extremely high symptom burden at 31 [see below]. According to the history there was audible wheeze and this resulted in a CT scan of the chest that showed new-onset pulmonary nodules [although no prior CT for comparison] on 12/30/2012. The largest nodule is 8 mm in size in the right upper lobe anterior segment close to the midline and just behind the sternum.  she's been referred to pulmonary  Spirometry in our office 02/02/2013: Show very severe obstruction with FEV1 0.77 L/34% predicted and a ratio of 43.        CT chest 12/30/12  CT CHEST WITHOUT CONTRAST  Technique: Multidetector CT imaging of the chest was performed  following the standard protocol without IV contrast.  Comparison: Chest radiographs dated 12/07/2012  Findings: Scattered bilateral pulmonary nodules, including:  --8 x 5 mm nodule in the medial right upper lobe (series 3/image  19)  --3 mm nodule in the left upper lobe (series 3/image 20)  --4 x 5 mm nodule in the right lower lobe (series 3/image 47)  Additional mild nodularity with scattered areas of bronchiectasis  and  tree-in-bud opacity throughout all lobes.  Mild centrilobular emphysematous changes. Radiation changes in the  anterior left upper lobe. No pleural effusion or pneumothorax.  Visualized thyroid is unremarkable.  The heart is normal in size. No pericardial effusion. Coronary  atherosclerosis. Atherosclerotic calcifications of the aortic  arch.  No suspicious mediastinal or axillary lymphadenopathy.  Visualized upper abdomen is grossly unremarkable.  Mild degenerative changes of the visualized thoracolumbar spine.  Cervical spine fixation hardware.    IMPRESSION:  Scattered small bilateral pulmonary nodules measuring up to 8 x 5  mm in the right upper lobe.  Given high risk for primary bronchogenic neoplasm, initial follow-  up CT chest is suggested in 3-6 months.  This recommendation follows the consensus statement: Guidelines for  Management of Small Pulmonary Nodules Detected on CT Scans: A  Statement from the Fleischner Society as published in Radiology  2005; 237:395-400.  Original Report Authenticated By: Charline Bills, M.D.    [facility] #COPD  - you appear to have severe to very severe COPD Take prednisone 40 mg daily x 2 days, then 20mg  daily x 2 days, then 10mg  daily x 2 days, then 5mg  daily x 2 days and stop - Please start spiriva 1 puff daily - take sample, script and show technique - Please start Dulera 100/52 puff 2 times daily - Please start albuterol as needed 2 puff - At followup we'll do full pulmonary function test and discuss the role of pulmonary rehabilitation and alpha 1 genetic test  #Smoking - You absolutely need to  quit smoking  # lung nodule - This might or might not be lung cancer, cannot tell for sure -Do CT scan of the chest without contrast in 3 months - We will discuss blood testing for lung cancer proteins at the time of followup both oncology immune and INDI (she has deferred testing at this visit 02/02/13)  #Followup - 1 month with full pulmonary  function test and walk test in the office   OV 03/29/2013 Followup for walking, lung nodule, COPD   - In terms of smoking: She continues to smoke. She is now suggest to quit but is unable to. She's not ready for to quit. Not interested in medications at this point  - Lung nodule  - CT scan 3rd  month is scheduled at 4th month on 05/10/13. So this point we do not have it  -COPD - Since starting Spiriva and Dulera, symptoms have improved significantly. Her COPD cat score is down to 12 compared to 31 prior to treatment. PFT 03/29/13: Show significant improvement and she is on Gold stage II COPD . fev1 1.38L/58%,POSt BD is 1.36L/57%, Ratio 58, FVC 2.3L/76%, Ratio 58, DLCO 13/62%, however, she is having significant cost issues with Dulera at $95 per month  - New issue  - She's complaining of some hoarseness on and off since starting the inhalers. She feels her symptoms she chokes on her throat. It is more early in the morning. On exam she has oral thrush   CAT COPD Symptom & Quality of Life Score (GSK trademark) 0 is no burden. 5 is highest burden 02/02/2013 Baseline Untreated  03/29/2013 Rx spiriva, dulera  Never Cough -> Cough all the time 5 3  No phlegm in chest -> Chest is full of phlegm 5 2  No chest tightness -> Chest feels very tight 5 2  No dyspnea for 1 flight stairs/hill -> Very dyspneic for 1 flight of stairs 3 2  No limitations for ADL at home -> Very limited with ADL at home 0 0  Confident leaving home -> Not at all confident leaving home 5 0  Sleep soundly -> Do not sleep soundly because of lung condition 5 0  Lots of Energy -> No energy at all 3 3  TOTAL Score (max 40)  31 12    Review of Systems  Constitutional: Negative for fever and unexpected weight change.  HENT: Negative for ear pain, nosebleeds, congestion, sore throat, rhinorrhea, sneezing, trouble swallowing, dental problem, postnasal drip and sinus pressure.   Eyes: Negative for redness and itching.  Respiratory:  Positive for shortness of breath. Negative for cough, chest tightness and wheezing.   Cardiovascular: Negative for palpitations and leg swelling.  Gastrointestinal: Negative for nausea and vomiting.  Genitourinary: Negative for dysuria.  Musculoskeletal: Negative for joint swelling.  Skin: Negative for rash.  Neurological: Negative for headaches.  Hematological: Does not bruise/bleed easily.  Psychiatric/Behavioral: Negative for dysphoric mood. The patient is not nervous/anxious.        Objective:   Physical Exam Vitals reviewed. Constitutional: She is oriented to person, place, and time. She appears well-developed and well-nourished. No distress.  There is no weight on file to calculate BMI.    HENT: Oral thrush present Head: Normocephalic and atraumatic.  Right Ear: External ear normal.  Left Ear: External ear normal.  Mouth/Throat: Oropharynx is clear and moist. No oropharyngeal exudate.  Scar present left side of neck  Eyes: Conjunctivae and EOM are normal. Pupils are equal, round, and reactive to light.  Right eye exhibits no discharge. Left eye exhibits no discharge. No scleral icterus.  Neck: Normal range of motion. Neck supple. No JVD present. No tracheal deviation present. No thyromegaly present.  Cardiovascular: Normal rate, regular rhythm, normal heart sounds and intact distal pulses.  Exam reveals no gallop and no friction rub.   No murmur heard. Pulmonary/Chest: Effort normal. No respiratory distress. WHEEZING RESOLVED She has no rales. She exhibits no tenderness.  Abdominal: Soft. Bowel sounds are normal. She exhibits no distension and no mass. There is no tenderness. There is no rebound and no guarding.  Musculoskeletal: Normal range of motion. She exhibits no edema and no tenderness.  Lymphadenopathy:    She has no cervical adenopathy.  Neurological: She is alert and oriented to person, place, and time. She has normal reflexes. No cranial nerve deficit. She exhibits  normal muscle tone. Coordination normal.  Skin: Skin is warm and dry. No rash noted. She is not diaphoretic. No erythema. No pallor.  Psychiatric: She has a normal mood and affect. Her behavior is normal. Judgment and thought content normal.           Assessment & Plan:

## 2013-03-29 NOTE — Telephone Encounter (Signed)
Rx has been sent in per MR's OV note. Pt is aware. Nothing further is needed. 

## 2013-03-30 DIAGNOSIS — B370 Candidal stomatitis: Secondary | ICD-10-CM | POA: Insufficient documentation

## 2013-03-30 NOTE — Assessment & Plan Note (Signed)
#   Oral thrush - For Oral thrush: Take Suspension (swish and swallow): 500,000 units 4 times/day for 5 days; swish in the mouth and retain for as long as possible (several minutes) before swallowing  

## 2013-03-30 NOTE — Assessment & Plan Note (Signed)
Strongly advised to quit. Currently she's not ready to quit. I hope pulmonary rehabilitation will motivate her to quit. We'll continue to monitor this

## 2013-03-30 NOTE — Assessment & Plan Note (Signed)
Await the third month scan end of July 2014.

## 2013-03-30 NOTE — Assessment & Plan Note (Signed)
#  COPD - Her symptoms have improved after starting Spiriva and Dulera. Gold stage COPD is also improved from stage III to stage II. She's having significant financial burden with Dulera but she is okay with Spiriva.  Plan - continue spiriva  - stop dulera due to thrush and cost - Please start symbicort 160/4.5 2 puff twice daily   - use spacer  - rinse mouht with listerine after use  - Learn technique - USe albuterol as needed - REferring you to pulmonary rehabilitation at [place] or [place] - Check blood test for alpha  1 genetic test today

## 2013-04-02 LAB — ALPHA-1 ANTITRYPSIN PHENOTYPE: A-1 Antitrypsin: 133 mg/dL (ref 83–199)

## 2013-04-13 ENCOUNTER — Other Ambulatory Visit: Payer: Medicare Other

## 2013-04-30 ENCOUNTER — Telehealth: Payer: Self-pay | Admitting: Internal Medicine

## 2013-04-30 NOTE — Telephone Encounter (Signed)
Alpha 1 test in June 2014 shows one normal gene M and another one abnormal S. However, the norma one compensates amd can protect her lung as long as she quits smoking. IF she does not quit, lungs can get real worse   Will discuss at fu  Dr. Kalman Shan, M.D., [facility].C.P Pulmonary and Critical Care Medicine Staff Physician [facility] Pager: [phone], If no answer or between  15:00h - 7:00h: call [phone]  04/30/2013 4:39 AM

## 2013-05-06 NOTE — Telephone Encounter (Signed)
Pt advised. Jamie [name], CMA  

## 2013-05-10 ENCOUNTER — Ambulatory Visit (INDEPENDENT_AMBULATORY_CARE_PROVIDER_SITE_OTHER)
Admission: RE | Admit: 2013-05-10 | Discharge: 2013-05-10 | Disposition: A | Discharge status: Home / Self Care | Payer: Medicare Other | Source: Ambulatory Visit | Attending: Internal Medicine | Admitting: Internal Medicine

## 2013-05-10 DIAGNOSIS — R911 Solitary pulmonary nodule: Secondary | ICD-10-CM

## 2013-05-14 ENCOUNTER — Encounter: Payer: Self-pay | Admitting: Family Medicine

## 2013-05-14 ENCOUNTER — Ambulatory Visit (INDEPENDENT_AMBULATORY_CARE_PROVIDER_SITE_OTHER): Payer: Medicare Other | Admitting: Family Medicine

## 2013-05-14 ENCOUNTER — Other Ambulatory Visit: Payer: Medicare Other

## 2013-05-14 VITALS — BP 133/79 | HR 79 | Temp 98.0°F | Ht 62.0 in | Wt 116.0 lb

## 2013-05-14 DIAGNOSIS — E785 Hyperlipidemia, unspecified: Secondary | ICD-10-CM

## 2013-05-14 DIAGNOSIS — E559 Vitamin D deficiency, unspecified: Secondary | ICD-10-CM

## 2013-05-14 DIAGNOSIS — I10 Essential (primary) hypertension: Secondary | ICD-10-CM

## 2013-05-14 DIAGNOSIS — E119 Type 2 diabetes mellitus without complications: Secondary | ICD-10-CM

## 2013-05-14 LAB — POCT GLYCOSYLATED HEMOGLOBIN (HGB A1C): Hemoglobin A1C: 6.7

## 2013-05-14 MED ORDER — AMLODIPINE BESYLATE 5 MG PO TABS: 5.0000 mg | ORAL_TABLET | Freq: Every day | ORAL | Status: DC

## 2013-05-14 MED ORDER — ATORVASTATIN CALCIUM 40 MG PO TABS: 40.0000 mg | ORAL_TABLET | Freq: Every day | ORAL | Status: DC

## 2013-05-14 MED ORDER — INSULIN NPH ISOPHANE & REGULAR (70-30) 100 UNIT/ML ~~LOC~~ SUSP: 30.0000 [IU] | Freq: Every day | SUBCUTANEOUS | Status: DC

## 2013-05-14 MED ORDER — LOSARTAN POTASSIUM 100 MG PO TABS: 100.0000 mg | ORAL_TABLET | Freq: Every day | ORAL | Status: DC

## 2013-05-14 NOTE — Patient Instructions (Addendum)
Smoking Cessation Quitting smoking is important to your health and has many advantages. However, it is not always easy to quit since nicotine is a very addictive drug. Often times, people try 3 times or more before being able to quit. This document explains the best ways for you to prepare to quit smoking. Quitting takes hard work and a lot of effort, but you can do it. ADVANTAGES OF QUITTING SMOKING  You will live longer, feel better, and live better.  Your body will feel the impact of quitting smoking almost immediately.  Within 20 minutes, blood pressure decreases. Your pulse returns to its normal level.  After 8 hours, carbon monoxide levels in the blood return to normal. Your oxygen level increases.  After 24 hours, the chance of having a heart attack starts to decrease. Your breath, hair, and body stop smelling like smoke.  After 48 hours, damaged nerve endings begin to recover. Your sense of taste and smell improve.  After 72 hours, the body is virtually free of nicotine. Your bronchial tubes relax and breathing becomes easier.  After 2 to 12 weeks, lungs can hold more air. Exercise becomes easier and circulation improves.  The risk of having a heart attack, stroke, cancer, or lung disease is greatly reduced.  After 1 year, the risk of coronary heart disease is cut in half.  After 5 years, the risk of stroke falls to the same as a nonsmoker.  After 10 years, the risk of lung cancer is cut in half and the risk of other cancers decreases significantly.  After 15 years, the risk of coronary heart disease drops, usually to the level of a nonsmoker.  If you are pregnant, quitting smoking will improve your chances of having a healthy baby.  The people you live with, especially any children, will be healthier.  You will have extra money to spend on things other than cigarettes. QUESTIONS TO THINK ABOUT BEFORE ATTEMPTING TO QUIT You may want to talk about your answers with your  caregiver.  Why do you want to quit?  If you tried to quit in the past, what helped and what did not?  What will be the most difficult situations for you after you quit? How will you plan to handle them?  Who can help you through the tough times? Your family? Friends? A caregiver?  What pleasures do you get from smoking? What ways can you still get pleasure if you quit? Here are some questions to ask your caregiver:  How can you help me to be successful at quitting?  What medicine do you think would be best for me and how should I take it?  What should I do if I need more help?  What is smoking withdrawal like? How can I get information on withdrawal? GET READY  Set a quit date.  Change your environment by getting rid of all cigarettes, ashtrays, matches, and lighters in your home, car, or work. Do not let people smoke in your home.  Review your past attempts to quit. Think about what worked and what did not. GET SUPPORT AND ENCOURAGEMENT You have a better chance of being successful if you have help. You can get support in many ways.  Tell your family, friends, and co-workers that you are going to quit and need their support. Ask them not to smoke around you.  Get individual, group, or telephone counseling and support. Programs are available at local hospitals and health centers. Call your local health department for   information about programs in your area.  Spiritual beliefs and practices may help some smokers quit.  Download a "quit meter" on your computer to keep track of quit statistics, such as how long you have gone without smoking, cigarettes not smoked, and money saved.  Get a self-help book about quitting smoking and staying off of tobacco. LEARN NEW SKILLS AND BEHAVIORS  Distract yourself from urges to smoke. Talk to someone, go for a walk, or occupy your time with a task.  Change your normal routine. Take a different route to work. Drink tea instead of coffee.  Eat breakfast in a different place.  Reduce your stress. Take a hot bath, exercise, or read a book.  Plan something enjoyable to do every day. Reward yourself for not smoking.  Explore interactive web-based programs that specialize in helping you quit. GET MEDICINE AND USE IT CORRECTLY Medicines can help you stop smoking and decrease the urge to smoke. Combining medicine with the above behavioral methods and support can greatly increase your chances of successfully quitting smoking.  Nicotine replacement therapy helps deliver nicotine to your body without the negative effects and risks of smoking. Nicotine replacement therapy includes nicotine gum, lozenges, inhalers, nasal sprays, and skin patches. Some may be available over-the-counter and others require a prescription.  Antidepressant medicine helps people abstain from smoking, but how this works is unknown. This medicine is available by prescription.  Nicotinic receptor partial agonist medicine simulates the effect of nicotine in your brain. This medicine is available by prescription. Ask your caregiver for advice about which medicines to use and how to use them based on your health history. Your caregiver will tell you what side effects to look out for if you choose to be on a medicine or therapy. Carefully read the information on the package. Do not use any other product containing nicotine while using a nicotine replacement product.  RELAPSE OR DIFFICULT SITUATIONS Most relapses occur within the first 3 months after quitting. Do not be discouraged if you start smoking again. Remember, most people try several times before finally quitting. You may have symptoms of withdrawal because your body is used to nicotine. You may crave cigarettes, be irritable, feel very hungry, cough often, get headaches, or have difficulty concentrating. The withdrawal symptoms are only temporary. They are strongest when you first quit, but they will go away within  10 14 days. To reduce the chances of relapse, try to:  Avoid drinking alcohol. Drinking lowers your chances of successfully quitting.  Reduce the amount of caffeine you consume. Once you quit smoking, the amount of caffeine in your body increases and can give you symptoms, such as a rapid heartbeat, sweating, and anxiety.  Avoid smokers because they can make you want to smoke.  Do not let weight gain distract you. Many smokers will gain weight when they quit, usually less than 10 pounds. Eat a healthy diet and stay active. You can always lose the weight gained after you quit.  Find ways to improve your mood other than smoking. FOR MORE INFORMATION  www.smokefree.gov  Document Released: 09/24/2001 Document Revised: 03/31/2012 Document Reviewed: 01/09/2012 [employer] Patient Information 2014 [employer].  

## 2013-05-14 NOTE — Progress Notes (Addendum)
Patient ID: Jamie [name], female   DOB: 1951/01/11, 62 y.o.   MRN: [id number]  APPEARANCE:  Patient in no acute distress.The patient appeared well nourished and normally developed. Acyanotic. Waist: VITAL SIGNS:BP 133/79  Pulse 79  Temp(Src) 98 F (36.7 C) (Oral)  Ht 5\' 2"  (1.575 m)  Wt 116 lb (52.617 kg)  BMI 21.21 kg/m2 WF Very talkative. SKIN: warm and  Dry without overt rashes, tattoos and scars  HEAD and Neck: without JVD, Head and scalp: normal Eyes:No scleral icterus. Fundi normal, eye movements normal. Ears: Auricle normal, canal normal, Tympanic membranes normal, insufflation normal.hearing reduced Nose: normal Throat: normal Neck & thyroid: normal  CHEST & LUNGS: Chest wall: normal Lungs: Clear  CVS: Reveals the PMI to be normally located. Regular rhythm, First and Second Heart sounds are normal,  absence of murmurs, rubs or gallops. Peripheral vasculature: Radial pulses: normal  ABDOMEN:  Appearance: normal Benign,, no organomegaly, no masses, no Abdominal Aortic enlargement. No Guarding , no rebound. No Bruits. Bowel sounds: normal  RECTAL: N/A GU: N/A  EXTREMETIES: nonedematous. Both Femoral and Pedal pulses are palpable   MUSCULOSKELETAL:  Spine: normal   NEUROLOGIC: oriented to time,place and person; nonfocal. Strength is normal Sensory is normal Reflexes are normal Hard of hearing  Assessment DM (diabetes mellitus) - Plan: CMP14+EGFR, POCT glycosylated hemoglobin (Hb A1C), insulin NPH-regular (NOVOLIN 70/30) (70-30) 100 UNIT/ML injection, DISCONTINUED: insulin NPH-regular (NOVOLIN 70/30) (70-30) 100 UNIT/ML injection  HLD (hyperlipidemia) - Plan: CMP14+EGFR, NMR, lipoprofile, atorvastatin (LIPITOR) 40 MG tablet  HTN (hypertension) - Plan: CMP14+EGFR, amLODipine (NORVASC) 5 MG tablet, losartan (COZAAR) 100 MG tablet  Unspecified vitamin D deficiency - Plan: Vitamin D 25 hydroxy   PLAN  Orders Placed This Encounter  Procedures  .  CMP14+EGFR  . NMR, lipoprofile  . Vitamin D 25 hydroxy  . POCT glycosylated hemoglobin (Hb A1C)    Meds ordered this encounter  Medications  . DISCONTD: insulin NPH-regular (NOVOLIN 70/30) (70-30) 100 UNIT/ML injection    Sig: 30 units in AM and 15 units in PM    Dispense:  10 mL  . amLODipine (NORVASC) 5 MG tablet    Sig: Take 1 tablet (5 mg total) by mouth daily.    Dispense:  30 tablet    Refill:  11  . atorvastatin (LIPITOR) 40 MG tablet    Sig: Take 1 tablet (40 mg total) by mouth daily.    Dispense:  30 tablet    Refill:  11  . insulin NPH-regular (NOVOLIN 70/30) (70-30) 100 UNIT/ML injection    Sig: Inject 30 Units into the skin daily with breakfast. 30 units in AM and 15 units in PM    Dispense:  10 mL    Refill:  11  . losartan (COZAAR) 100 MG tablet    Sig: Take 1 tablet (100 mg total) by mouth daily.    Dispense:  30 tablet    Refill:  11    Results for orders placed in visit on 05/14/13  POCT GLYCOSYLATED HEMOGLOBIN (HGB A1C)      Result Value Range   Hemoglobin A1C 6.7%      Smoking cessation counseled.  Return in about 6 weeks (around 06/25/2013) for recheck insulin adjustment.  Lyris Hitchman P. [place] Charon, M.D.

## 2013-05-14 NOTE — Progress Notes (Signed)
Patient ID: KHALEAH DUER, female   DOB: 11/02/1950, 62 y.o.   MRN: [id number] SUBJECTIVE: CC: Chief Complaint  Patient presents with  . Hypertension    f/u  . Hyperlipidemia  . Diabetes    HPI: Patient is here for follow up of Diabetes Mellitus:/hyperlipidemia/hypertension Symptoms evaluated Denies Nocturia ,Denies Urinary Frequency , denies Blurred vision ,deniesDizziness,denies.Dysuria,denies paresthesias, denies extremity pain or ulcers.Marland Kitchendenies chest pain. has had an annual eye exam. do check the feet. Does check CBGs. Average CBG:131in am episodes of hypoglycemia. 1 am to 1: 30 am sugar drops very low to 30s Does have an emergency hypoglycemic plan. admits toCompliance with medications. Denies Problems with medications.  Past Medical History  Diagnosis Date  . HTN (hypertension)   . Diabetes   . Breast cancer    Past Surgical History  Procedure Laterality Date  . Gallbladder surgery    . Breast surgery    . Inner ear surgery     History   Social History  . Marital Status: Married    Spouse Name: N/A    Number of Children: N/A  . Years of Education: N/A   Occupational History  . Not on file.   Social History Main Topics  . Smoking status: Current Every Day Smoker -- 1.00 packs/day for 45 years    Types: Cigarettes  . Smokeless tobacco: Never Used  . Alcohol Use: No  . Drug Use: No  . Sexually Active: Not on file   Other Topics Concern  . Not on file   Social History Narrative  . No narrative on file   Family History  Problem Relation Age of Onset  . Heart attack Mother   . Stroke Mother   . Stroke Sister   . Diabetes      sister x3, and mother  . Lung disease Father     worked in [employer]   Current Outpatient Prescriptions on File Prior to Visit  Medication Sig Dispense Refill  . amLODipine (NORVASC) 5 MG tablet Take 5 mg by mouth daily.      Marland Kitchen atorvastatin (LIPITOR) 40 MG tablet Take 40 mg by mouth daily.      . budesonide-formoterol  (SYMBICORT) 160-4.5 MCG/ACT inhaler Inhale 2 puffs into the lungs 2 (two) times daily.  1 Inhaler  6  . Cholecalciferol 4000 UNITS CAPS Take 1 capsule by mouth daily.      . insulin NPH-regular (NOVOLIN 70/30) (70-30) 100 UNIT/ML injection 30 units in AM and 25 units in PM      . losartan (COZAAR) 100 MG tablet Take 100 mg by mouth daily.      Marland Kitchen Spacer/Aero-Holding Chambers (AEROCHAMBER MV) inhaler Use as instructed  1 each  0  . tiotropium (SPIRIVA) 18 MCG inhalation capsule Place 1 capsule (18 mcg total) into inhaler and inhale daily.  30 capsule  6   No current facility-administered medications on file prior to visit.   Allergies  Allergen Reactions  . Soma (Carisoprodol)    Immunization History  Administered Date(s) Administered  . Influenza Split 08/14/2012  . Pneumococcal Polysaccharide 10/14/2002   Prior to Admission medications   Medication Sig Start Date End Date Taking? Authorizing Provider  amLODipine (NORVASC) 5 MG tablet Take 5 mg by mouth daily.   Yes Historical Provider, MD  atorvastatin (LIPITOR) 40 MG tablet Take 40 mg by mouth daily.   Yes Historical Provider, MD  budesonide-formoterol (SYMBICORT) 160-4.5 MCG/ACT inhaler Inhale 2 puffs into the lungs 2 (two) times  daily. 03/29/13  Yes Kalman Shan, MD  Cholecalciferol 4000 UNITS CAPS Take 1 capsule by mouth daily.   Yes Historical Provider, MD  insulin NPH-regular (NOVOLIN 70/30) (70-30) 100 UNIT/ML injection 30 units in AM and 25 units in PM   Yes Historical Provider, MD  losartan (COZAAR) 100 MG tablet Take 100 mg by mouth daily.   Yes Historical Provider, MD  Spacer/Aero-Holding Chambers (AEROCHAMBER MV) inhaler Use as instructed 03/29/13  Yes Kalman Shan, MD  tiotropium (SPIRIVA) 18 MCG inhalation capsule Place 1 capsule (18 mcg total) into inhaler and inhale daily. 02/02/13  Yes Kalman Shan, MD    ROS: As above in the HPI. All other systems are stable or negative.  OBJECTIVE: APPEARANCE:  Patient  in no acute distress.The patient appeared well nourished and normally developed. Acyanotic. Waist: VITAL SIGNS:  SKIN: warm and  Dry without overt rashes, tattoos and scars  HEAD and Neck: without JVD, Head and scalp: normal Eyes:No scleral icterus. Fundi normal, eye movements normal. Ears: Auricle normal, canal normal, Tympanic membranes normal, insufflation normal. Nose: normal Throat: normal Neck & thyroid: normal  CHEST & LUNGS: Chest wall: normal Lungs: Clear  CVS: Reveals the PMI to be normally located. Regular rhythm, First and Second Heart sounds are normal,  absence of murmurs, rubs or gallops. Peripheral vasculature: Radial pulses: normal Dorsal pedis pulses: normal Posterior pulses: normal  ABDOMEN:  Appearance: normal Benign, no organomegaly, no masses, no Abdominal Aortic enlargement. No Guarding , no rebound. No Bruits. Bowel sounds: normal  RECTAL: N/A GU: N/A  EXTREMETIES: nonedematous. Both Femoral and Pedal pulses are normal.  MUSCULOSKELETAL:  Spine: normal Joints: intact  NEUROLOGIC: oriented to time,place and person; nonfocal. Strength is normal Sensory is normal Reflexes are normal Cranial Nerves are normal.  ASSESSMENT:   PLAN:

## 2013-05-16 LAB — NMR, LIPOPROFILE
Cholesterol: 159 mg/dL (ref <200)
HDL Cholesterol by NMR: 49 mg/dL (ref >=40)
HDL Particle Number: 35.3 umol/L (ref >=30.5)
LDL Particle Number: 1578 nmol/L — ABNORMAL HIGH (ref <1000)
LDL Size: 20.5 nm — ABNORMAL LOW (ref >20.5)
LDLC SERPL CALC-MCNC: 96 mg/dL (ref <100)
LP-IR Score: 47 — ABNORMAL HIGH (ref <=45)
Small LDL Particle Number: 887 nmol/L — ABNORMAL HIGH (ref <=527)
Triglycerides by NMR: 72 mg/dL (ref <150)

## 2013-05-16 LAB — VITAMIN D 25 HYDROXY (VIT D DEFICIENCY, FRACTURES): Vit D, 25-Hydroxy: 35.4 ng/mL (ref 30.0–100.0)

## 2013-05-16 LAB — CMP14+EGFR
ALT: 16 IU/L (ref 0–32)
AST: 16 IU/L (ref 0–40)
Albumin/Globulin Ratio: 1.7 (ref 1.1–2.5)
Albumin: 4 g/dL (ref 3.6–4.8)
Alkaline Phosphatase: 74 IU/L (ref 39–117)
BUN/Creatinine Ratio: 17 (ref 11–26)
BUN: 18 mg/dL (ref 8–27)
CO2: 24 mmol/L (ref 18–29)
Calcium: 9.4 mg/dL (ref 8.6–10.2)
Chloride: 100 mmol/L (ref 97–108)
Creatinine, Ser: 1.05 mg/dL — ABNORMAL HIGH (ref 0.57–1.00)
GFR calc Af Amer: 67 mL/min/{1.73_m2} (ref >59)
GFR calc non Af Amer: 58 mL/min/{1.73_m2} — ABNORMAL LOW (ref >59)
Globulin, Total: 2.3 g/dL (ref 1.5–4.5)
Glucose: 161 mg/dL — ABNORMAL HIGH (ref 65–99)
Potassium: 4.1 mmol/L (ref 3.5–5.2)
Sodium: 137 mmol/L (ref 134–144)
Total Bilirubin: 0.4 mg/dL (ref 0.0–1.2)
Total Protein: 6.3 g/dL (ref 6.0–8.5)

## 2013-05-23 ENCOUNTER — Telehealth: Payer: Self-pay | Admitting: Internal Medicine

## 2013-05-23 NOTE — Telephone Encounter (Signed)
Stable 8mm nodule in July 2014. No change in 3 months. Seeing her 05/24/13. Will need oncimmune and indi blood tests. Will d/w her 05/24/13 fu   Dr. Kalman Shan, M.D., [facility].C.P Pulmonary and Critical Care Medicine Staff Physician [facility] Pager: [phone], If no answer or between  15:00h - 7:00h: call [phone]  05/23/2013 8:22 PM

## 2013-05-24 ENCOUNTER — Ambulatory Visit (INDEPENDENT_AMBULATORY_CARE_PROVIDER_SITE_OTHER): Payer: Medicare Other | Admitting: Internal Medicine

## 2013-05-24 ENCOUNTER — Encounter: Payer: Self-pay | Admitting: Internal Medicine

## 2013-05-24 VITALS — BP 119/68 | HR 70 | Temp 97.8°F | Ht 62.0 in | Wt 117.0 lb

## 2013-05-24 DIAGNOSIS — J449 Chronic obstructive pulmonary disease, unspecified: Secondary | ICD-10-CM

## 2013-05-24 DIAGNOSIS — B370 Candidal stomatitis: Secondary | ICD-10-CM

## 2013-05-24 DIAGNOSIS — J4489 Other specified chronic obstructive pulmonary disease: Secondary | ICD-10-CM

## 2013-05-24 DIAGNOSIS — F17200 Nicotine dependence, unspecified, uncomplicated: Secondary | ICD-10-CM

## 2013-05-24 DIAGNOSIS — R911 Solitary pulmonary nodule: Secondary | ICD-10-CM

## 2013-05-24 NOTE — Progress Notes (Signed)
Subjective:    Patient ID: Jamie [name], female    DOB: 05-01-1951, 62 y.o.   MRN: [id number]  HPI #Breast Cancer  -left breast cancer survivor [status post lumpectomy left breast followed by radiation therapy in 2006 and since then in complete remission with unclear followup details]   #SMoker -  reports that she has been smoking Cigarettes.  She has a 45 pack-year smoking history. She has never used smokeless tobacco.  #COPD SEVERE- MS  - Spirometry in our office 02/02/2013: Show very severe obstruction with FEV1 0.77 L/34% predicted and a ratio of 43. -  PFT 03/29/13: Show significant improvement and she is on Gold stage II COPD . fev1 1.38L/58%,POSt BD is 1.36L/57%, Ratio 58, FVC 2.3L/76%, Ratio 58, DLCO 13/62% - referred rehab April 2014   #Lung nodule -No change MArch 2014 and July 2014   -   -  8 mm in size in the right upper lobe anterior segment close to the midline and just behind the sternum.  Findings: Scattered bilateral pulmonary nodules, including:  --8 x 5 mm nodule in the medial right upper lobe (series 3/image  19)  --3 mm nodule in the left upper lobe (series 3/image 20)  --4 x 5 mm nodule in the right lower lobe (series 3/image 47)  Additional mild nodularity with scattered areas of bronchiectasis  and tree-in-bud opacity throughout all lobes.     OV 05/24/2013 FU for above. Presents with husband.   COPD : further improvement in symptoms after dulera changed to symbicort. Continues spiriva. CAT score is only 5. I did confirm with her that she undrestood CAT scoring correctly  SMoking: stil smoking.   Thrush: resolved after change of dulera to symbicort  Lung nodule: 3 month CT Chest July 2014 shows no change. She does not seem to comprehend intricacicies of serum biomarker testing for lung cancer so I will defer    CAT COPD Symptom & Quality of Life Score (GSK trademark) 0 is no burden. 5 is highest burden 02/02/2013 Baseline Untreated  03/29/2013 Rx  spiriva, dulera 05/24/2013 Rx spiriva and symbicort  Never Cough -> Cough all the time 5 3 1   No phlegm in chest -> Chest is full of phlegm 5 2 2   No chest tightness -> Chest feels very tight 5 2 0  No dyspnea for 1 flight stairs/hill -> Very dyspneic for 1 flight of stairs 3 2 1   No limitations for ADL at home -> Very limited with ADL at home 0 0 0  Confident leaving home -> Not at all confident leaving home 5 0 0  Sleep soundly -> Do not sleep soundly because of lung condition 5 0 1  Lots of Energy -> No energy at all 3 3 0  TOTAL Score (max 40)  31 12 5      Review of Systems  Constitutional: Negative for fever and unexpected weight change.  HENT: Negative for ear pain, nosebleeds, congestion, sore throat, rhinorrhea, sneezing, trouble swallowing, dental problem, postnasal drip and sinus pressure.   Eyes: Negative for redness and itching.  Respiratory: Negative for cough, chest tightness, shortness of breath and wheezing.   Cardiovascular: Negative for palpitations and leg swelling.  Gastrointestinal: Negative for nausea and vomiting.  Genitourinary: Negative for dysuria.  Musculoskeletal: Negative for joint swelling.  Skin: Negative for rash.  Neurological: Negative for headaches.  Hematological: Does not bruise/bleed easily.  Psychiatric/Behavioral: Negative for dysphoric mood. The patient is not nervous/anxious.    Current outpatient prescriptions:amLODipine (  NORVASC) 5 MG tablet, Take 1 tablet (5 mg total) by mouth daily., Disp: 30 tablet, Rfl: 11;  atorvastatin (LIPITOR) 40 MG tablet, Take 1 tablet (40 mg total) by mouth daily., Disp: 30 tablet, Rfl: 11;  budesonide-formoterol (SYMBICORT) 160-4.5 MCG/ACT inhaler, Inhale 2 puffs into the lungs 2 (two) times daily., Disp: 1 Inhaler, Rfl: 6 Cholecalciferol 4000 UNITS CAPS, Take 1 capsule by mouth daily., Disp: , Rfl: ;  insulin NPH-regular (NOVOLIN 70/30) (70-30) 100 UNIT/ML injection, Inject 30 Units into the skin daily with  breakfast. 30 units in AM and 15 units in PM, Disp: 10 mL, Rfl: 11;  losartan (COZAAR) 100 MG tablet, Take 1 tablet (100 mg total) by mouth daily., Disp: 30 tablet, Rfl: 11 Spacer/Aero-Holding Chambers (AEROCHAMBER MV) inhaler, Use as instructed, Disp: 1 each, Rfl: 0;  tiotropium (SPIRIVA) 18 MCG inhalation capsule, Place 1 capsule (18 mcg total) into inhaler and inhale daily., Disp: 30 capsule, Rfl: 6     Objective:   Physical Exam Filed Vitals:   05/24/13 1343  BP: 119/68  Pulse: 70  Temp: 97.8 F (36.6 C)  TempSrc: Oral  Height: 5\' 2"  (1.575 m)  Weight: 117 lb (53.071 kg)  SpO2: 96%    Constitutional: She is oriented to person, place, and time. She appears well-developed and well-nourished. No distress.    HENT: Oral thrush resolved Head: Normocephalic and atraumatic.  Right Ear: External ear normal.  Left Ear: External ear normal.  Mouth/Throat: Oropharynx is clear and moist. No oropharyngeal exudate.  Scar present left side of neck  Eyes: Conjunctivae and EOM are normal. Pupils are equal, round, and reactive to light. Right eye exhibits no discharge. Left eye exhibits no discharge. No scleral icterus.  Neck: Normal range of motion. Neck supple. No JVD present. No tracheal deviation present. No thyromegaly present.  Cardiovascular: Normal rate, regular rhythm, normal heart sounds and intact distal pulses.  Exam reveals no gallop and no friction rub.   No murmur heard. Pulmonary/Chest: Effort normal. No respiratory distress. WHEEZING RESOLVED She has no rales. She exhibits no tenderness.  Abdominal: Soft. Bowel sounds are normal. She exhibits no distension and no mass. There is no tenderness. There is no rebound and no guarding.  Musculoskeletal: Normal range of motion. She exhibits no edema and no tenderness.  Lymphadenopathy:    She has no cervical adenopathy.  Neurological: She is alert and oriented to person, place, and time. She has normal reflexes. No cranial nerve  deficit. She exhibits normal muscle tone. Coordination normal.  Skin: Skin is warm and dry. No rash noted. She is not diaphoretic. No erythema. No pallor.  Psychiatric: S? POOR IQ but does have capacity           Assessment & Plan:

## 2013-05-24 NOTE — Patient Instructions (Addendum)
#  COPD - disease is stable - continue spiriva  - - continue  symbicort 160/4.5 2 puff twice daily   - use spacer  - rinse mouht with listerine after use - USe albuterol as needed - REferring you to pulmonary rehabilitation at [facility] or [place]  #SMoking  - advised to quit   #Oral thrush - resolved  #Lung nodule - Right Upper Lobe 8mm - stable March 2014 and July 2014  - repeat CT chest Nov 2014   #Followupo Nov 2014 after CT chest COPD CAT score at followup

## 2013-05-29 NOTE — Assessment & Plan Note (Signed)
#  Oral thrush - resolved

## 2013-05-29 NOTE — Assessment & Plan Note (Signed)
#  COPD - disease is stable - continue spiriva  - - continue  symbicort 160/4.5 2 puff twice daily   - use spacer  - rinse mouht with listerine after use - USe albuterol as needed - REferring you to pulmonary rehabilitation at [place] or [place]

## 2013-05-29 NOTE — Assessment & Plan Note (Signed)
#  SMoking  - advised to quit

## 2013-05-29 NOTE — Assessment & Plan Note (Signed)
#  Lung nodule - Right Upper Lobe 8mm - stable March 2014 and July 2014  - repeat CT chest Nov 2014   #Followupo Nov 2014 after CT chest COPD CAT score at followup

## 2013-06-09 ENCOUNTER — Telehealth (HOSPITAL_COMMUNITY): Payer: Self-pay | Admitting: *Deleted

## 2013-06-09 NOTE — Telephone Encounter (Signed)
Patient was contacted regarding Pulmonary Rehab Referral,  She is having difficulty getting her medications due to "donut hole"  She lives in [place], [place], would have a co-pay per visit and she cannot afford.  She does not qualify for financial assistance because she does have insurance. Advised to call office for possible samples of medications.   Cathie Olden RN

## 2013-06-11 ENCOUNTER — Telehealth: Payer: Self-pay | Admitting: Family Medicine

## 2013-06-15 ENCOUNTER — Other Ambulatory Visit: Payer: Self-pay | Admitting: Family Medicine

## 2013-06-15 DIAGNOSIS — E119 Type 2 diabetes mellitus without complications: Secondary | ICD-10-CM

## 2013-06-15 MED ORDER — INSULIN NPH ISOPHANE & REGULAR (70-30) 100 UNIT/ML ~~LOC~~ SUSP: 30.0000 [IU] | Freq: Every day | SUBCUTANEOUS | Status: DC

## 2013-06-15 NOTE — Telephone Encounter (Signed)
Prescription renewed in EPIC. 

## 2013-06-15 NOTE — Telephone Encounter (Signed)
Pt notified that the other vial reordered per dr Modesto Charon

## 2013-06-16 ENCOUNTER — Telehealth: Payer: Self-pay | Admitting: Family Medicine

## 2013-06-18 ENCOUNTER — Other Ambulatory Visit: Payer: Self-pay | Admitting: Family Medicine

## 2013-06-18 DIAGNOSIS — E119 Type 2 diabetes mellitus without complications: Secondary | ICD-10-CM

## 2013-06-18 MED ORDER — INSULIN NPH ISOPHANE & REGULAR (70-30) 100 UNIT/ML ~~LOC~~ SUSP: 30.0000 [IU] | Freq: Every day | SUBCUTANEOUS | Status: DC

## 2013-06-18 NOTE — Telephone Encounter (Signed)
rx sent to pharm

## 2013-06-18 NOTE — Telephone Encounter (Signed)
Pt.notified

## 2013-06-25 ENCOUNTER — Ambulatory Visit: Payer: Medicare Other | Admitting: Family Medicine

## 2013-08-12 ENCOUNTER — Encounter: Payer: Self-pay | Admitting: Family Medicine

## 2013-08-12 ENCOUNTER — Ambulatory Visit (INDEPENDENT_AMBULATORY_CARE_PROVIDER_SITE_OTHER): Payer: Medicare Other

## 2013-08-12 ENCOUNTER — Ambulatory Visit (INDEPENDENT_AMBULATORY_CARE_PROVIDER_SITE_OTHER): Payer: Medicare Other | Admitting: Family Medicine

## 2013-08-12 VITALS — BP 140/92 | HR 97 | Temp 98.9°F | Ht 62.0 in | Wt 111.0 lb

## 2013-08-12 DIAGNOSIS — J441 Chronic obstructive pulmonary disease with (acute) exacerbation: Secondary | ICD-10-CM

## 2013-08-12 MED ORDER — SODIUM CHLORIDE 0.9 % IV SOLN: 125.0000 mg | Freq: Once | INTRAVENOUS | Status: DC

## 2013-08-12 MED ORDER — PREDNISONE 50 MG PO TABS: ORAL_TABLET | ORAL | Status: DC

## 2013-08-12 MED ORDER — METHYLPREDNISOLONE SODIUM SUCC 125 MG IJ SOLR
125.0000 mg | Freq: Once | INTRAMUSCULAR | Status: AC
  Administered 2013-08-12: 125 mg via INTRAMUSCULAR

## 2013-08-12 MED ORDER — DOXYCYCLINE HYCLATE 100 MG PO CAPS: 100.0000 mg | ORAL_CAPSULE | Freq: Two times a day (BID) | ORAL | Status: DC

## 2013-08-12 MED ORDER — IPRATROPIUM-ALBUTEROL 0.5-2.5 (3) MG/3ML IN SOLN
3.0000 mL | Freq: Once | RESPIRATORY_TRACT | Status: AC
  Administered 2013-08-12: 3 mL via RESPIRATORY_TRACT

## 2013-08-12 NOTE — Addendum Note (Signed)
Addended by: Gwenith Daily on: 08/12/2013 04:20 PM   Modules accepted: Orders

## 2013-08-12 NOTE — Progress Notes (Signed)
  Subjective:    Patient ID: Jamie [name], female    DOB: July 16, 1951, 62 y.o.   MRN: [id number]  HPI URI Symptoms Onset: 4-5 days  Description: rhinorrhea, nasal congestion, cough, wheezing  Modifying factors:  Hx/o COPD, 1/4 PPD smoker, hx/o lung nodule   Symptoms Nasal discharge: yes Fever: no Sore throat: no Cough: yes Wheezing: yes Ear pain: no GI symptoms: no Sick contacts: no  Red Flags  Stiff neck: no Dyspnea: minimal  Rash: no Swallowing difficulty: no  Sinusitis Risk Factors Headache/face pain: no Double sickening: no tooth pain: no  Allergy Risk Factors Sneezing: no Itchy scratchy throat: no Seasonal symptoms: no  Flu Risk Factors Headache: n muscle aches: no severe fatigue: no     Review of Systems  All other systems reviewed and are negative.       Objective:   Physical Exam  Constitutional:  Underweight, looks older than age   HENT:  Head: Normocephalic and atraumatic.  Right Ear: External ear normal.  Left Ear: External ear normal.  +nasal erythema, rhinorrhea bilaterally, + post oropharyngeal erythema    Eyes: Conjunctivae are normal. Pupils are equal, round, and reactive to light.  Neck: Normal range of motion.  Cardiovascular: Normal rate and regular rhythm.   Pulmonary/Chest: Effort normal. She has wheezes (significant wheezes diffusely ).  Abdominal: Soft. Bowel sounds are normal.  Musculoskeletal: Normal range of motion.  Neurological: She is alert.  Skin: Skin is warm.   [facility] reading (PRIMARY) by  Dr. Alvester Morin  Preliminary CXR read negative for any focal infiltrate. Noted emphysematous changes.                                          Assessment & Plan:  COPD exacerbation - Plan: methylPREDNISolone sodium succinate (SOLU-MEDROL) 130 mg in sodium chloride 0.9 % 50 mL IVPB, predniSONE (DELTASONE) 50 MG tablet, doxycycline (VIBRAMYCIN) 100 MG capsule, DG Chest 2 View  Likely viral induced COPD exacerbation.  Solumedrol  125mg  IM x1 Duoneb x1. Noted improvement in aeration s/p treatment.  Prednisone x 7 days  Doxycycline x 7 days.  Discussed smoking cessation.,  Also discussed infectious and resp red flags  Follow up as needed.

## 2013-08-25 ENCOUNTER — Ambulatory Visit (INDEPENDENT_AMBULATORY_CARE_PROVIDER_SITE_OTHER)
Admission: RE | Admit: 2013-08-25 | Discharge: 2013-08-25 | Disposition: A | Discharge status: Home / Self Care | Payer: Medicare Other | Source: Ambulatory Visit | Attending: Internal Medicine | Admitting: Internal Medicine

## 2013-08-25 DIAGNOSIS — R911 Solitary pulmonary nodule: Secondary | ICD-10-CM

## 2013-08-26 ENCOUNTER — Telehealth: Payer: Self-pay | Admitting: *Deleted

## 2013-08-27 ENCOUNTER — Ambulatory Visit: Payer: Medicare Other | Admitting: Internal Medicine

## 2013-08-30 ENCOUNTER — Encounter: Payer: Self-pay | Admitting: Internal Medicine

## 2013-08-30 ENCOUNTER — Ambulatory Visit (INDEPENDENT_AMBULATORY_CARE_PROVIDER_SITE_OTHER): Payer: Medicare Other | Admitting: Internal Medicine

## 2013-08-30 VITALS — BP 130/80 | HR 69 | Ht 62.0 in | Wt 113.0 lb

## 2013-08-30 DIAGNOSIS — F17200 Nicotine dependence, unspecified, uncomplicated: Secondary | ICD-10-CM

## 2013-08-30 DIAGNOSIS — I2510 Atherosclerotic heart disease of native coronary artery without angina pectoris: Secondary | ICD-10-CM

## 2013-08-30 DIAGNOSIS — R911 Solitary pulmonary nodule: Secondary | ICD-10-CM

## 2013-08-30 DIAGNOSIS — I2584 Coronary atherosclerosis due to calcified coronary lesion: Secondary | ICD-10-CM

## 2013-08-30 DIAGNOSIS — J449 Chronic obstructive pulmonary disease, unspecified: Secondary | ICD-10-CM

## 2013-08-30 NOTE — Progress Notes (Signed)
Subjective:    Patient ID: Jamie [name], female    DOB: [birth year], 62 y.o.   MRN: [id number]  HPI HPI #Breast Cancer  -left breast cancer survivor [status post lumpectomy left breast followed by radiation therapy in 2006 and since then in complete remission with unclear followup details]   #SMoker -  reports that she has been smoking Cigarettes.  She has a 45 pack-year smoking history. She has never used smokeless tobacco.  #COPD SEVERE- MS  - Spirometry in our office 02/02/2013: Show very severe obstruction with FEV1 0.77 L/34% predicted and a ratio of 43. -  PFT 03/29/13: Show significant improvement and she is on Gold stage II COPD . fev1 1.38L/58%,POSt BD is 1.36L/57%, Ratio 58, FVC 2.3L/76%, Ratio 58, DLCO 13/62% - referred rehab April 2014   #Lung nodule -No change MArch 2014 and July 2014   -   -  8 mm in size in the right upper lobe anterior segment close to the midline and just behind the sternum.  Findings: Scattered bilateral pulmonary nodules, including:  --8 x 5 mm nodule in the medial right upper lobe (series 3/image  19)  --3 mm nodule in the left upper lobe (series 3/image 20)  --4 x 5 mm nodule in the right lower lobe (series 3/image 47)  Additional mild nodularity with scattered areas of bronchiectasis  and tree-in-bud opacity throughout all lobes.     OV 08/30/2013   Smoking:  reports that she has been smoking Cigarettes.  She has a 11.25 pack-year smoking history. She has never used smokeless tobacco.  Nodule:   IMPRESSION: 08/25/13 1. Scattered mild bronchiectasis, mucoid impaction and  peribronchovascular ground-glass/ nodularity. Findings are unchanged  from baseline examination of 12/30/2012 and may be due to  mycobacterium avium complex (MAC) or post infectious scarring. Given  the patient's smoking history, additional followup could be  performed in 1 year to ensure continued stability. This  recommendation follows the consensus statement:  Guidelines for  Management of Small Pulmonary Nodules Detected on CT Scans: A  Statement from the Fleischner Society as published in Radiology  2005; 237:395-400.  2. Three-vessel coronary artery calcification.  Electronically Signed  By: Leanna Battles M.D.  On: 08/25/2013 13:34    COPD:   - Currently has a stable COPD. COPD cat score is 11. However she is not taking her inhalers for several months because of cost issues and running into the donut hole. This resulted in a COPD exacerbation a few weeks ago and this was treated with antibiotics and prednisone according to her history at the primary care office. She has been given samples of Spiriva and Symbicort and is currently in stable status. She is frustrated at the cost of her inhalers. She wants cheaper options.  CAT COPD Symptom & Quality of Life Score (GSK trademark) 0 is no burden. 5 is highest burden 02/02/2013 Baseline Untreated  03/29/2013 Rx spiriva, dulera 05/24/2013 Rx spiriva and symbicort 08/30/2013   Never Cough -> Cough all the time 5 3 1 2   No phlegm in chest -> Chest is full of phlegm 5 2 2 3   No chest tightness -> Chest feels very tight 5 2 0 1  No dyspnea for 1 flight stairs/hill -> Very dyspneic for 1 flight of stairs 3 2 1 2   No limitations for ADL at home -> Very limited with ADL at home 0 0 0 0  Confident leaving home -> Not at all confident leaving home 5 0 0 0  Sleep soundly -> Do not sleep soundly because of lung condition 5 0 1 1  Lots of Energy -> No energy at all 3 3 0 3  TOTAL Score (max 40)  31 12 5 11      Review of Systems  Constitutional: Negative for fever and unexpected weight change.  HENT: Negative for congestion, dental problem, ear pain, nosebleeds, postnasal drip, rhinorrhea, sinus pressure, sneezing, sore throat and trouble swallowing.   Eyes: Negative for redness and itching.  Respiratory: Positive for cough and shortness of breath. Negative for chest tightness and wheezing.    Cardiovascular: Negative for palpitations and leg swelling.  Gastrointestinal: Negative for nausea and vomiting.  Genitourinary: Negative for dysuria.  Musculoskeletal: Negative for joint swelling.  Skin: Negative for rash.  Neurological: Negative for headaches.  Hematological: Does not bruise/bleed easily.  Psychiatric/Behavioral: Negative for dysphoric mood. The patient is not nervous/anxious.    Current outpatient prescriptions:amLODipine (NORVASC) 5 MG tablet, Take 1 tablet (5 mg total) by mouth daily., Disp: 30 tablet, Rfl: 11;  atorvastatin (LIPITOR) 40 MG tablet, Take 1 tablet (40 mg total) by mouth daily., Disp: 30 tablet, Rfl: 11;  Cholecalciferol 4000 UNITS CAPS, Take 1 capsule by mouth daily., Disp: , Rfl:  insulin NPH-regular (NOVOLIN 70/30) (70-30) 100 UNIT/ML injection, Inject 30 Units into the skin daily with breakfast. 30 units in AM and 15 units in PM, Disp: 40 mL, Rfl: 11;  losartan (COZAAR) 100 MG tablet, Take 1 tablet (100 mg total) by mouth daily., Disp: 30 tablet, Rfl: 11;  Spacer/Aero-Holding Chambers (AEROCHAMBER MV) inhaler, Use as instructed, Disp: 1 each, Rfl: 0 budesonide-formoterol (SYMBICORT) 160-4.5 MCG/ACT inhaler, Inhale 2 puffs into the lungs 2 (two) times daily., Disp: 1 Inhaler, Rfl: 6;  tiotropium (SPIRIVA) 18 MCG inhalation capsule, Place 1 capsule (18 mcg total) into inhaler and inhale daily., Disp: 30 capsule, Rfl: 6     Objective:   Physical Exam   Constitutional: She is oriented to person, place, and time. She appears well-developed and well-nourished. No distress.    HENT:  ead: Normocephalic and atraumatic.  Right Ear: External ear normal.  Left Ear: External ear normal.  Mouth/Throat: Oropharynx is clear and moist. No oropharyngeal exudate.  Scar present left side of neck  Eyes: Conjunctivae and EOM are normal. Pupils are equal, round, and reactive to light. Right eye exhibits no discharge. Left eye exhibits no discharge. No scleral icterus.   Neck: Normal range of motion. Neck supple. No JVD present. No tracheal deviation present. No thyromegaly present.  Cardiovascular: Normal rate, regular rhythm, normal heart sounds and intact distal pulses.  Exam reveals no gallop and no friction rub.   No murmur heard. Pulmonary/Chest: Effort normal. No respiratory distress. WHEEZING RESOLVED She has no rales. She exhibits no tenderness.  Abdominal: Soft. Bowel sounds are normal. She exhibits no distension and no mass. There is no tenderness. There is no rebound and no guarding.  Musculoskeletal: Normal range of motion. She exhibits no edema and no tenderness.  Lymphadenopathy:    She has no cervical adenopathy.  Neurological: She is alert and oriented to person, place, and time. She has normal reflexes. No cranial nerve deficit. She exhibits normal muscle tone. Coordination normal.  Skin: Skin is warm and dry. No rash noted. She is not diaphoretic. No erythema. No pallor.  Psychiatric: S? POOR IQ but does have capacity            Assessment & Plan:

## 2013-08-30 NOTE — Patient Instructions (Signed)
#  COPD - disease is stable after recent bronchitis - due to cost issues stop spiriva and symbicort - START  - albuterol nebulizer 3-4 times daily as scheduled  -atrovent nebulizer 3-4 times daily as schedule  - QVAR , 1 puff twice daily - USe albuterol as needed - IF this is too costly, let me know - REferring you to pulmonary rehabilitation at [place] or [place]  #SMoking  - advised to quit again   #Lung nodule - Right Upper Lobe 8mm - stable March 2014 and July 2014 and Nov 2014  - repeat CT chest Nov 2015  #COronary Artery Calcification  - will address at next visit  #Followupo 4-5 months or sooner if needed CAT score at followup

## 2013-08-31 ENCOUNTER — Encounter: Payer: Self-pay | Admitting: Internal Medicine

## 2013-08-31 DIAGNOSIS — I2510 Atherosclerotic heart disease of native coronary artery without angina pectoris: Secondary | ICD-10-CM | POA: Insufficient documentation

## 2013-08-31 NOTE — Assessment & Plan Note (Signed)
#  SMoking  - advised to quit again

## 2013-08-31 NOTE — Assessment & Plan Note (Signed)
#  COronary Artery Calcification  - will address at next visit

## 2013-08-31 NOTE — Assessment & Plan Note (Signed)
#  COPD - disease is stable after recent bronchitis - due to cost issues stop spiriva and symbicort - START  - albuterol nebulizer 3-4 times daily as scheduled  -atrovent nebulizer 3-4 times daily as schedule  - QVAR , 1 puff twice daily - USe albuterol as needed - IF this is too costly, let me know - REferring you to pulmonary rehabilitation at [facility] or [place] #Followupo 4-5 months or sooner if needed CAT score at followup

## 2013-08-31 NOTE — Assessment & Plan Note (Signed)
#  Lung nodule - Right Upper Lobe 8mm - stable March 2014 and July 2014 and Nov 2014  - repeat CT chest Nov 2015

## 2013-08-31 NOTE — Telephone Encounter (Signed)
NA

## 2013-09-22 ENCOUNTER — Ambulatory Visit (INDEPENDENT_AMBULATORY_CARE_PROVIDER_SITE_OTHER): Payer: Medicare Other | Admitting: Family Medicine

## 2013-09-22 ENCOUNTER — Encounter: Payer: Self-pay | Admitting: Family Medicine

## 2013-09-22 VITALS — BP 122/77 | HR 81 | Temp 99.0°F | Wt 112.4 lb

## 2013-09-22 DIAGNOSIS — J209 Acute bronchitis, unspecified: Secondary | ICD-10-CM

## 2013-09-22 DIAGNOSIS — J441 Chronic obstructive pulmonary disease with (acute) exacerbation: Secondary | ICD-10-CM

## 2013-09-22 DIAGNOSIS — G8929 Other chronic pain: Secondary | ICD-10-CM

## 2013-09-22 DIAGNOSIS — H9209 Otalgia, unspecified ear: Secondary | ICD-10-CM

## 2013-09-22 DIAGNOSIS — H9201 Otalgia, right ear: Secondary | ICD-10-CM

## 2013-09-22 DIAGNOSIS — R1013 Epigastric pain: Secondary | ICD-10-CM

## 2013-09-22 MED ORDER — METHYLPREDNISOLONE (PAK) 4 MG PO TABS: ORAL_TABLET | ORAL | Status: DC

## 2013-09-22 MED ORDER — CIPROFLOXACIN-DEXAMETHASONE 0.3-0.1 % OT SUSP: 4.0000 [drp] | Freq: Two times a day (BID) | OTIC | Status: DC

## 2013-09-22 MED ORDER — SUCRALFATE 1 G PO TABS: 1.0000 g | ORAL_TABLET | Freq: Three times a day (TID) | ORAL | Status: DC

## 2013-09-22 MED ORDER — AZITHROMYCIN 250 MG PO TABS: ORAL_TABLET | ORAL | Status: DC

## 2013-09-22 NOTE — Patient Instructions (Signed)

## 2013-09-22 NOTE — Progress Notes (Signed)
   Subjective:    Patient ID: Jamie [name], female    DOB: 1951/02/19, 62 y.o.   MRN: [id number]  HPI This 62 y.o. female presents for evaluation of abdominal pain and discomfort in her epigastric region. She is having URI sx's and cough.  She is having some right ear discomfort.  She is having some GERD sx's and severe discomfort in her epigastric region.  Review of Systems No chest pain, SOB, HA, dizziness, vision change, N/V, diarrhea, constipation, dysuria, urinary urgency or frequency, myalgias, arthralgias or rash.     Objective:   Physical Exam  Vital signs noted  Well developed well nourished female.  HEENT - Head atraumatic Normocephalic                Eyes - PERRLA, Conjuctiva - clear Sclera- Clear EOMI                Ears - EAC's Wnl TM's Wnl Gross Hearing WNL                Nose - Nares patent                 Throat - oropharanx wnl Respiratory - Lungs CTA bilateral Cardiac - RRR S1 and S2 without murmur GI - Abdomen soft tender epigastric region and bowel sounds active x 4 Extremities - No edema. Neuro - Grossly intact.      Assessment & Plan:

## 2013-09-29 NOTE — Progress Notes (Signed)
   Subjective:    Patient ID: Jamie [name], female    DOB: [birth year], 62 y.o.   MRN: [id number]  HPI    Review of Systems     Objective:   Physical Exam        Assessment & Plan:  COPD exacerbation - Plan: methylPREDNIsolone (MEDROL DOSPACK) 4 MG tablet  Acute bronchitis - Plan: methylPREDNIsolone (MEDROL DOSPACK) 4 MG tablet, azithromycin (ZITHROMAX) 250 MG tablet  Otalgia of right ear - Plan: ciprofloxacin-dexamethasone (CIPRODEX) otic suspension  Abdominal pain, chronic, epigastric - Plan: sucralfate (CARAFATE) 1 G tablet  Deatra Canter FNP

## 2013-10-05 ENCOUNTER — Ambulatory Visit: Payer: Medicare Other | Admitting: Family Medicine

## 2013-10-13 ENCOUNTER — Ambulatory Visit: Payer: Medicare Other | Admitting: Family Medicine

## 2013-10-13 ENCOUNTER — Encounter: Payer: Self-pay | Admitting: Family Medicine

## 2013-10-13 ENCOUNTER — Ambulatory Visit (INDEPENDENT_AMBULATORY_CARE_PROVIDER_SITE_OTHER): Payer: Medicare Other | Admitting: Family Medicine

## 2013-10-13 VITALS — BP 127/72 | HR 78 | Temp 98.7°F | Ht 62.0 in | Wt 114.4 lb

## 2013-10-13 DIAGNOSIS — E119 Type 2 diabetes mellitus without complications: Secondary | ICD-10-CM | POA: Insufficient documentation

## 2013-10-13 DIAGNOSIS — I10 Essential (primary) hypertension: Secondary | ICD-10-CM

## 2013-10-13 DIAGNOSIS — IMO0001 Reserved for inherently not codable concepts without codable children: Secondary | ICD-10-CM

## 2013-10-13 DIAGNOSIS — I2510 Atherosclerotic heart disease of native coronary artery without angina pectoris: Secondary | ICD-10-CM

## 2013-10-13 DIAGNOSIS — E559 Vitamin D deficiency, unspecified: Secondary | ICD-10-CM

## 2013-10-13 DIAGNOSIS — R911 Solitary pulmonary nodule: Secondary | ICD-10-CM

## 2013-10-13 DIAGNOSIS — E782 Mixed hyperlipidemia: Secondary | ICD-10-CM | POA: Insufficient documentation

## 2013-10-13 DIAGNOSIS — F17200 Nicotine dependence, unspecified, uncomplicated: Secondary | ICD-10-CM

## 2013-10-13 DIAGNOSIS — I2584 Coronary atherosclerosis due to calcified coronary lesion: Secondary | ICD-10-CM

## 2013-10-13 DIAGNOSIS — E785 Hyperlipidemia, unspecified: Secondary | ICD-10-CM | POA: Insufficient documentation

## 2013-10-13 DIAGNOSIS — J449 Chronic obstructive pulmonary disease, unspecified: Secondary | ICD-10-CM

## 2013-10-13 DIAGNOSIS — E1169 Type 2 diabetes mellitus with other specified complication: Secondary | ICD-10-CM | POA: Insufficient documentation

## 2013-10-13 MED ORDER — AMLODIPINE BESYLATE 5 MG PO TABS: 5.0000 mg | ORAL_TABLET | Freq: Every day | ORAL | Status: DC

## 2013-10-13 MED ORDER — LOSARTAN POTASSIUM 100 MG PO TABS: 100.0000 mg | ORAL_TABLET | Freq: Every day | ORAL | Status: DC

## 2013-10-13 MED ORDER — ATORVASTATIN CALCIUM 40 MG PO TABS
40.0000 mg | ORAL_TABLET | Freq: Every day | ORAL | Status: DC
Start: 2013-10-13 — End: 2014-09-30

## 2013-10-13 MED ORDER — INSULIN NPH ISOPHANE & REGULAR (70-30) 100 UNIT/ML ~~LOC~~ SUSP: 30.0000 [IU] | Freq: Every day | SUBCUTANEOUS | Status: DC

## 2013-10-13 NOTE — Progress Notes (Signed)
Patient ID: Jamie [name], female   DOB: Mar 17, 1951, 62 y.o.   MRN: [id number] SUBJECTIVE: CC: Chief Complaint  Patient presents with  . Follow-up    needs refills needs written rx for 90 day  will need rx insulin sent to [facility]     HPI: Patient is here for follow up of Diabetes Mellitus/: Symptoms evaluated: Denies Nocturia ,Denies Urinary Frequency , denies Blurred vision ,deniesDizziness,denies.Dysuria,denies paresthesias, denies extremity pain or ulcers.Marland Kitchendenies chest pain. has had an annual eye exam. do check the feet. Does check CBGs. Average WUJ:[id number] but better at 150. Thinks that the care she has received is great and feels better here. Denies episodes of hypoglycemia. Does have an emergency hypoglycemic plan. admits toCompliance with medications. Denies Problems with medications.  Past Medical History  Diagnosis Date  . Breast cancer   . COPD (chronic obstructive pulmonary disease)   . HTN (hypertension)   . Hyperlipidemia   . Diabetes   . Vitamin D deficiency    Past Surgical History  Procedure Laterality Date  . Gallbladder surgery    . Breast surgery    . Inner ear surgery     History   Social History  . Marital Status: Married    Spouse Name: N/A    Number of Children: N/A  . Years of Education: N/A   Occupational History  . Not on file.   Social History Main Topics  . Smoking status: Current Every Day Smoker -- 0.25 packs/day for 45 years    Types: Cigarettes  . Smokeless tobacco: Never Used  . Alcohol Use: No  . Drug Use: No  . Sexual Activity: Not on file   Other Topics Concern  . Not on file   Social History Narrative  . No narrative on file   Family History  Problem Relation Age of Onset  . Heart attack Mother   . Stroke Mother   . Stroke Sister   . Diabetes      sister x3, and mother  . Lung disease Father     worked in [employer]   Current Outpatient Prescriptions on File Prior to Visit  Medication Sig Dispense Refill   . budesonide-formoterol (SYMBICORT) 160-4.5 MCG/ACT inhaler Inhale 2 puffs into the lungs 2 (two) times daily.  1 Inhaler  6  . Cholecalciferol 4000 UNITS CAPS Take 1 capsule by mouth daily.      . methylPREDNIsolone (MEDROL DOSPACK) 4 MG tablet follow package directions  21 tablet  0  . omeprazole (PRILOSEC OTC) 20 MG tablet Take 20 mg by mouth daily.      Marland Kitchen Spacer/Aero-Holding Chambers (AEROCHAMBER MV) inhaler Use as instructed  1 each  0  . sucralfate (CARAFATE) 1 G tablet Take 1 tablet (1 g total) by mouth 4 (four) times daily -  with meals and at bedtime.  56 tablet  0  . tiotropium (SPIRIVA) 18 MCG inhalation capsule Place 1 capsule (18 mcg total) into inhaler and inhale daily.  30 capsule  6   No current facility-administered medications on file prior to visit.   Allergies  Allergen Reactions  . Soma [Carisoprodol]    Immunization History  Administered Date(s) Administered  . Influenza Split 08/14/2012, 07/17/2013  . Pneumococcal Polysaccharide-23 10/14/2002   Prior to Admission medications   Medication Sig Start Date End Date Taking? Authorizing Provider  amLODipine (NORVASC) 5 MG tablet Take 1 tablet (5 mg total) by mouth daily. 05/14/13   Ileana Ladd, MD  atorvastatin (LIPITOR) 40  MG tablet Take 1 tablet (40 mg total) by mouth daily. 05/14/13   Ileana Ladd, MD  azithromycin (ZITHROMAX) 250 MG tablet Take 2 po first day and then one po qd x 4 days 09/22/13   Deatra Canter, FNP  budesonide-formoterol [facility]) 160-4.5 MCG/ACT inhaler Inhale 2 puffs into the lungs 2 (two) times daily. 03/29/13   Kalman Shan, MD  Cholecalciferol 4000 UNITS CAPS Take 1 capsule by mouth daily.    Historical Provider, MD  ciprofloxacin-dexamethasone (CIPRODEX) otic suspension Place 4 drops into the right ear 2 (two) times daily. 09/22/13   Deatra Canter, FNP  insulin NPH-regular (NOVOLIN 70/30) (70-30) 100 UNIT/ML injection Inject 30 Units into the skin daily with breakfast. 30 units in  AM and 15 units in PM 06/18/13   Deatra Canter, FNP  losartan (COZAAR) 100 MG tablet Take 1 tablet (100 mg total) by mouth daily. 05/14/13   Ileana Ladd, MD  methylPREDNIsolone (MEDROL DOSPACK) 4 MG tablet follow package directions 09/22/13   Deatra Canter, FNP  omeprazole (PRILOSEC OTC) 20 MG tablet Take 20 mg by mouth daily.    Historical Provider, MD  Spacer/Aero-Holding Chambers (AEROCHAMBER MV) inhaler Use as instructed 03/29/13   Kalman Shan, MD  sucralfate (CARAFATE) 1 G tablet Take 1 tablet (1 g total) by mouth 4 (four) times daily -  with meals and at bedtime. 09/22/13   Deatra Canter, FNP  tiotropium (SPIRIVA) 18 MCG inhalation capsule Place 1 capsule (18 mcg total) into inhaler and inhale daily. 02/02/13   Kalman Shan, MD     ROS: As above in the HPI. All other systems are stable or negative.  OBJECTIVE: APPEARANCE:  Patient in no acute distress.The patient appeared well nourished and normally developed. Acyanotic. Waist: VITAL SIGNS:BP 127/72  Pulse 78  Temp(Src) 98.7 F (37.1 C) (Oral)  Ht 5\' 2"  (1.575 m)  Wt 114 lb 6.4 oz (51.891 kg)  BMI 20.92 kg/m2 Talkative WF looks older than stated age.   SKIN: warm and  Dry without overt rashes, tattoos and scars  HEAD and Neck: without JVD, Head and scalp: normal Eyes:No scleral icterus. Fundi normal, eye movements normal. Ears: Auricle normal, canal normal, Tympanic membranes normal, insufflation normal. Nose: normal Throat: normal Neck & thyroid: normal  CHEST & LUNGS: Chest wall: normal Lungs: Clear  CVS: Reveals the PMI to be normally located. Regular rhythm, First and Second Heart sounds are normal,  absence of murmurs, rubs or gallops. Peripheral vasculature: Radial pulses: normal Dorsal pedis pulses: normal Posterior pulses: normal  ABDOMEN:  Appearance: normal Benign, no organomegaly, no masses, no Abdominal Aortic enlargement. No Guarding , no rebound. No Bruits. Bowel sounds:  normal  RECTAL: N/A GU: N/A  EXTREMETIES: nonedematous.  MUSCULOSKELETAL:  Spine: normal Joints: intact  NEUROLOGIC: oriented to time,place and person; nonfocal. Strength is normal Sensory is normal Reflexes are normal Cranial Nerves are normal.  ASSESSMENT: Diabetes - Plan: POCT glycosylated hemoglobin (Hb A1C), POCT UA - Microalbumin, CMP14+EGFR  Vitamin D deficiency - Plan: Vit D  25 hydroxy (rtn osteoporosis monitoring)  Smoking  Moderate COPD (chronic obstructive pulmonary disease)  Lung nodule, right upper lobe 8 mm March 2014  HTN (hypertension) - Plan: losartan (COZAAR) 100 MG tablet, amLODipine (NORVASC) 5 MG tablet, DISCONTINUED: amLODipine (NORVASC) 5 MG tablet  Coronary artery calcification  HLD (hyperlipidemia) - Plan: atorvastatin (LIPITOR) 40 MG tablet, CMP14+EGFR, NMR, lipoprofile  DM (diabetes mellitus) - Plan: insulin NPH-regular (NOVOLIN 70/30) (70-30) 100 UNIT/ML injection  PLAN: Rx for U-100 insulin syringes 1/2 ml ; 8 mm length';31G  Discussed need for smoking cessation. Discussed eye exam needed annually  Discussed daily footcare.       Dr Woodroe Mode Recommendations  For nutrition information, I recommend books:  1).Eat to Live by Dr Monico Hoar. 2).Prevent and Reverse Heart Disease by Dr Suzzette Righter. 3) Dr Katherina Right Book:  Program to Reverse Diabetes  Exercise recommendations are:  If unable to walk, then the patient can exercise in a chair 3 times a day. By flapping arms like a bird gently and raising legs outwards to the front.  If ambulatory, the patient can go for walks for 30 minutes 3 times a week. Then increase the intensity and duration as tolerated.  Goal is to try to attain exercise frequency to 5 times a week.  If applicable: Best to perform resistance exercises (machines or weights) 2 days a week and cardio type exercises 3 days per week.   Handouts in the AVS.as pertaining to diet DM and foot  care.  Orders Placed This Encounter  Procedures  . CMP14+EGFR  . NMR, lipoprofile  . Vit D  25 hydroxy (rtn osteoporosis monitoring)  . POCT glycosylated hemoglobin (Hb A1C)  . POCT UA - Microalbumin   Meds ordered this encounter  Medications  . Insulin Syringe-Needle U-100 (B-D INS SYR ULTRAFINE 1CC/30G) 30G X 1/2" 1 ML MISC    Sig: by Does not apply route. Use as instructed twice a day  . atorvastatin (LIPITOR) 40 MG tablet    Sig: Take 1 tablet (40 mg total) by mouth daily.    Dispense:  90 tablet    Refill:  3  . DISCONTD: amLODipine (NORVASC) 5 MG tablet    Sig: Take 1 tablet (5 mg total) by mouth daily.    Dispense:  90 tablet    Refill:  3  . losartan (COZAAR) 100 MG tablet    Sig: Take 1 tablet (100 mg total) by mouth daily.    Dispense:  90 tablet    Refill:  3  . insulin NPH-regular (NOVOLIN 70/30) (70-30) 100 UNIT/ML injection    Sig: Inject 30 Units into the skin daily with breakfast. 30 units in AM and 15 units in PM    Dispense:  40 mL    Refill:  11    Needs two vials instead of one a month    Order Specific Question:  Supervising Provider    Answer:  Ernestina Penna [[id number]]  . amLODipine (NORVASC) 5 MG tablet    Sig: Take 1 tablet (5 mg total) by mouth daily.    Dispense:  90 tablet    Refill:  3   Medications Discontinued During This Encounter  Medication Reason  . azithromycin (ZITHROMAX) 250 MG tablet Completed Course  . ciprofloxacin-dexamethasone (CIPRODEX) otic suspension Completed Course  . atorvastatin (LIPITOR) 40 MG tablet Reorder  . amLODipine (NORVASC) 5 MG tablet Reorder  . losartan (COZAAR) 100 MG tablet Reorder  . insulin NPH-regular (NOVOLIN 70/30) (70-30) 100 UNIT/ML injection Reorder  . amLODipine (NORVASC) 5 MG tablet Reorder   Return in about 4 months (around 02/10/2014) for Recheck medical problems.  Zeshan Sena P. [place]esto Charon, M.D.

## 2013-10-13 NOTE — Patient Instructions (Signed)
Smoking Cessation Quitting smoking is important to your health and has many advantages. However, it is not always easy to quit since nicotine is a very addictive drug. Often times, people try 3 times or more before being able to quit. This document explains the best ways for you to prepare to quit smoking. Quitting takes hard work and a lot of effort, but you can do it. ADVANTAGES OF QUITTING SMOKING  You will live longer, feel better, and live better.  Your body will feel the impact of quitting smoking almost immediately.  Within 20 minutes, blood pressure decreases. Your pulse returns to its normal level.  After 8 hours, carbon monoxide levels in the blood return to normal. Your oxygen level increases.  After 24 hours, the chance of having a heart attack starts to decrease. Your breath, hair, and body stop smelling like smoke.  After 48 hours, damaged nerve endings begin to recover. Your sense of taste and smell improve.  After 72 hours, the body is virtually free of nicotine. Your bronchial tubes relax and breathing becomes easier.  After 2 to 12 weeks, lungs can hold more air. Exercise becomes easier and circulation improves.  The risk of having a heart attack, stroke, cancer, or lung disease is greatly reduced.  After 1 year, the risk of coronary heart disease is cut in half.  After 5 years, the risk of stroke falls to the same as a nonsmoker.  After 10 years, the risk of lung cancer is cut in half and the risk of other cancers decreases significantly.  After 15 years, the risk of coronary heart disease drops, usually to the level of a nonsmoker.  If you are pregnant, quitting smoking will improve your chances of having a healthy baby.  The people you live with, especially any children, will be healthier.  You will have extra money to spend on things other than cigarettes. QUESTIONS TO THINK ABOUT BEFORE ATTEMPTING TO QUIT You may want to talk about your answers with your  caregiver.  Why do you want to quit?  If you tried to quit in the past, what helped and what did not?  What will be the most difficult situations for you after you quit? How will you plan to handle them?  Who can help you through the tough times? Your family? Friends? A caregiver?  What pleasures do you get from smoking? What ways can you still get pleasure if you quit? Here are some questions to ask your caregiver:  How can you help me to be successful at quitting?  What medicine do you think would be best for me and how should I take it?  What should I do if I need more help?  What is smoking withdrawal like? How can I get information on withdrawal? GET READY  Set a quit date.  Change your environment by getting rid of all cigarettes, ashtrays, matches, and lighters in your home, car, or work. Do not let people smoke in your home.  Review your past attempts to quit. Think about what worked and what did not. GET SUPPORT AND ENCOURAGEMENT You have a better chance of being successful if you have help. You can get support in many ways.  Tell your family, friends, and co-workers that you are going to quit and need their support. Ask them not to smoke around you.  Get individual, group, or telephone counseling and support. Programs are available at local hospitals and health centers. Call your local health department for   information about programs in your area.  Spiritual beliefs and practices may help some smokers quit.  Download a "quit meter" on your computer to keep track of quit statistics, such as how long you have gone without smoking, cigarettes not smoked, and money saved.  Get a self-help book about quitting smoking and staying off of tobacco. LEARN NEW SKILLS AND BEHAVIORS  Distract yourself from urges to smoke. Talk to someone, go for a walk, or occupy your time with a task.  Change your normal routine. Take a different route to work. Drink tea instead of coffee.  Eat breakfast in a different place.  Reduce your stress. Take a hot bath, exercise, or read a book.  Plan something enjoyable to do every day. Reward yourself for not smoking.  Explore interactive web-based programs that specialize in helping you quit. GET MEDICINE AND USE IT CORRECTLY Medicines can help you stop smoking and decrease the urge to smoke. Combining medicine with the above behavioral methods and support can greatly increase your chances of successfully quitting smoking.  Nicotine replacement therapy helps deliver nicotine to your body without the negative effects and risks of smoking. Nicotine replacement therapy includes nicotine gum, lozenges, inhalers, nasal sprays, and skin patches. Some may be available over-the-counter and others require a prescription.  Antidepressant medicine helps people abstain from smoking, but how this works is unknown. This medicine is available by prescription.  Nicotinic receptor partial agonist medicine simulates the effect of nicotine in your brain. This medicine is available by prescription. Ask your caregiver for advice about which medicines to use and how to use them based on your health history. Your caregiver will tell you what side effects to look out for if you choose to be on a medicine or therapy. Carefully read the information on the package. Do not use any other product containing nicotine while using a nicotine replacement product.  RELAPSE OR DIFFICULT SITUATIONS Most relapses occur within the first 3 months after quitting. Do not be discouraged if you start smoking again. Remember, most people try several times before finally quitting. You may have symptoms of withdrawal because your body is used to nicotine. You may crave cigarettes, be irritable, feel very hungry, cough often, get headaches, or have difficulty concentrating. The withdrawal symptoms are only temporary. They are strongest when you first quit, but they will go away within  10 14 days. To reduce the chances of relapse, try to:  Avoid drinking alcohol. Drinking lowers your chances of successfully quitting.  Reduce the amount of caffeine you consume. Once you quit smoking, the amount of caffeine in your body increases and can give you symptoms, such as a rapid heartbeat, sweating, and anxiety.  Avoid smokers because they can make you want to smoke.  Do not let weight gain distract you. Many smokers will gain weight when they quit, usually less than 10 pounds. Eat a healthy diet and stay active. You can always lose the weight gained after you quit.  Find ways to improve your mood other than smoking. FOR MORE INFORMATION  www.smokefree.gov  Document Released: 09/24/2001 Document Revised: 03/31/2012 Document Reviewed: 01/09/2012 ExitCare Patient Information 2014 [employer].        Dr Hadas Jessop's Recommendations  For nutrition information, I recommend books:  1).Eat to Live by Dr Joel Fuhrman. 2).Prevent and Reverse Heart Disease by Dr Caldwell Esselstyn. 3) Dr Neal Barnard's Book:  Program to Reverse Diabetes  Exercise recommendations are:  If unable to walk, then the patient can   exercise in a chair 3 times a day. By flapping arms like a bird gently and raising legs outwards to the front.  If ambulatory, the patient can go for walks for 30 minutes 3 times a week. Then increase the intensity and duration as tolerated.  Goal is to try to attain exercise frequency to 5 times a week.  If applicable: Best to perform resistance exercises (machines or weights) 2 days a week and cardio type exercises 3 days per week.  Diabetes and Foot Care Diabetes may cause you to have problems because of poor blood supply (circulation) to your feet and legs. This may cause the skin on your feet to become thinner, break easier, and heal more slowly. Your skin may become dry, and the skin may peel and crack. You may also have nerve damage in your legs and feet causing  decreased feeling in them. You may not notice minor injuries to your feet that could lead to infections or more serious problems. Taking care of your feet is one of the most important things you can do for yourself.  HOME CARE INSTRUCTIONS  Wear shoes at all times, even in the house. Do not go barefoot. Bare feet are easily injured.  Check your feet daily for blisters, cuts, and redness. If you cannot see the bottom of your feet, use a mirror or ask someone for help.  Wash your feet with warm water (do not use hot water) and mild soap. Then pat your feet and the areas between your toes until they are completely dry. Do not soak your feet as this can dry your skin.  Apply a moisturizing lotion or petroleum jelly (that does not contain alcohol and is unscented) to the skin on your feet and to dry, brittle toenails. Do not apply lotion between your toes.  Trim your toenails straight across. Do not dig under them or around the cuticle. File the edges of your nails with an emery board or nail file.  Do not cut corns or calluses or try to remove them with medicine.  Wear clean socks or stockings every day. Make sure they are not too tight. Do not wear knee-high stockings since they may decrease blood flow to your legs.  Wear shoes that fit properly and have enough cushioning. To break in new shoes, wear them for just a few hours a day. This prevents you from injuring your feet. Always look in your shoes before you put them on to be sure there are no objects inside.  Do not cross your legs. This may decrease the blood flow to your feet.  If you find a minor scrape, cut, or break in the skin on your feet, keep it and the skin around it clean and dry. These areas may be cleansed with mild soap and water. Do not cleanse the area with peroxide, alcohol, or iodine.  When you remove an adhesive bandage, be sure not to damage the skin around it.  If you have a wound, look at it several times a day to make  sure it is healing.  Do not use heating pads or hot water bottles. They may burn your skin. If you have lost feeling in your feet or legs, you may not know it is happening until it is too late.  Make sure your health care provider performs a complete foot exam at least annually or more often if you have foot problems. Report any cuts, sores, or bruises to your health care provider   immediately. SEEK MEDICAL CARE IF:   You have an injury that is not healing.  You have cuts or breaks in the skin.  You have an ingrown nail.  You notice redness on your legs or feet.  You feel burning or tingling in your legs or feet.  You have pain or cramps in your legs and feet.  Your legs or feet are numb.  Your feet always feel cold. SEEK IMMEDIATE MEDICAL CARE IF:   There is increasing redness, swelling, or pain in or around a wound.  There is a red line that goes up your leg.  Pus is coming from a wound.  You develop a fever or as directed by your health care provider.  You notice a bad smell coming from an ulcer or wound. Document Released: 09/27/2000 Document Revised: 06/02/2013 Document Reviewed: 03/09/2013 ExitCare Patient Information 2014 [employer].  

## 2013-12-21 ENCOUNTER — Other Ambulatory Visit: Payer: Self-pay

## 2013-12-21 DIAGNOSIS — Z1231 Encounter for screening mammogram for malignant neoplasm of breast: Secondary | ICD-10-CM

## 2013-12-21 DIAGNOSIS — Z9889 Other specified postprocedural states: Secondary | ICD-10-CM

## 2013-12-31 ENCOUNTER — Telehealth: Payer: Self-pay | Admitting: Family Medicine

## 2014-01-04 NOTE — Telephone Encounter (Signed)
No form yet, tried to reach patient, no answer, no voicemail

## 2014-01-12 ENCOUNTER — Ambulatory Visit
Admission: RE | Admit: 2014-01-12 | Discharge: 2014-01-12 | Disposition: A | Discharge status: Home / Self Care | Payer: Medicare HMO | Source: Ambulatory Visit

## 2014-01-12 DIAGNOSIS — Z9889 Other specified postprocedural states: Secondary | ICD-10-CM

## 2014-01-12 DIAGNOSIS — Z1231 Encounter for screening mammogram for malignant neoplasm of breast: Secondary | ICD-10-CM

## 2014-01-12 NOTE — Telephone Encounter (Signed)
Notified pt we still do not have form, she will call [employer] back

## 2014-02-10 ENCOUNTER — Encounter: Payer: Self-pay | Admitting: Family Medicine

## 2014-02-10 ENCOUNTER — Ambulatory Visit (INDEPENDENT_AMBULATORY_CARE_PROVIDER_SITE_OTHER): Payer: Commercial Managed Care - HMO | Admitting: Family Medicine

## 2014-02-10 VITALS — BP 130/81 | HR 80 | Temp 97.2°F | Ht 62.0 in | Wt 112.6 lb

## 2014-02-10 DIAGNOSIS — I2510 Atherosclerotic heart disease of native coronary artery without angina pectoris: Secondary | ICD-10-CM

## 2014-02-10 DIAGNOSIS — E559 Vitamin D deficiency, unspecified: Secondary | ICD-10-CM

## 2014-02-10 DIAGNOSIS — E785 Hyperlipidemia, unspecified: Secondary | ICD-10-CM

## 2014-02-10 DIAGNOSIS — R309 Painful micturition, unspecified: Secondary | ICD-10-CM

## 2014-02-10 DIAGNOSIS — F17200 Nicotine dependence, unspecified, uncomplicated: Secondary | ICD-10-CM

## 2014-02-10 DIAGNOSIS — R300 Dysuria: Secondary | ICD-10-CM

## 2014-02-10 DIAGNOSIS — J449 Chronic obstructive pulmonary disease, unspecified: Secondary | ICD-10-CM

## 2014-02-10 DIAGNOSIS — H9190 Unspecified hearing loss, unspecified ear: Secondary | ICD-10-CM | POA: Insufficient documentation

## 2014-02-10 DIAGNOSIS — I10 Essential (primary) hypertension: Secondary | ICD-10-CM

## 2014-02-10 DIAGNOSIS — R911 Solitary pulmonary nodule: Secondary | ICD-10-CM

## 2014-02-10 DIAGNOSIS — I2584 Coronary atherosclerosis due to calcified coronary lesion: Secondary | ICD-10-CM

## 2014-02-10 DIAGNOSIS — E119 Type 2 diabetes mellitus without complications: Secondary | ICD-10-CM

## 2014-02-10 LAB — POCT UA - MICROSCOPIC ONLY
Casts, Ur, LPF, POC: NEGATIVE
Crystals, Ur, HPF, POC: NEGATIVE
Mucus, UA: NEGATIVE
Yeast, UA: NEGATIVE

## 2014-02-10 LAB — POCT URINALYSIS DIPSTICK
Bilirubin, UA: NEGATIVE
Glucose, UA: NEGATIVE
Ketones, UA: NEGATIVE
Nitrite, UA: POSITIVE
Protein, UA: NEGATIVE
Spec Grav, UA: 1.01
Urobilinogen, UA: NEGATIVE
pH, UA: 7.5

## 2014-02-10 LAB — POCT GLYCOSYLATED HEMOGLOBIN (HGB A1C): Hemoglobin A1C: 6.7

## 2014-02-10 MED ORDER — CIPROFLOXACIN HCL 500 MG PO TABS
500.0000 mg | ORAL_TABLET | Freq: Two times a day (BID) | ORAL | Status: DC
Start: 2014-02-10 — End: 2014-02-13

## 2014-02-10 NOTE — Progress Notes (Signed)
Patient ID: Jamie [name], female   DOB: Feb 27, 1951, 63 y.o.   MRN: [id number] SUBJECTIVE: CC: Chief Complaint  Patient presents with  . Diabetes    3 month recheck  . Hypertension  . Hyperlipidemia  . Urinary Tract Infection    burns with voiding    HPI:  Patient is here for follow up of Diabetes Mellitus/HTN/HLD: Symptoms evaluated: Denies Nocturia ,Denies Urinary Frequency , denies Blurred vision ,deniesDizziness,denies.Dysuria,denies paresthesias, denies extremity pain or ulcers.Marland Kitchendenies chest pain. has had an annual eye exam. do check the feet. Does check CBGs. Average CBG:85 Denies episodes of hypoglycemia. Does have an emergency hypoglycemic plan. admits toCompliance with medications. Denies Problems with medications.  Dysuria/burns when she urinates. No fever, no chills, no back pain.    Past Medical History  Diagnosis Date  . Breast cancer   . COPD (chronic obstructive pulmonary disease)   . HTN (hypertension)   . Hyperlipidemia   . Diabetes   . Vitamin D deficiency    Past Surgical History  Procedure Laterality Date  . Gallbladder surgery    . Breast surgery    . Inner ear surgery     History   Social History  . Marital Status: Married    Spouse Name: N/A    Number of Children: N/A  . Years of Education: N/A   Occupational History  . Not on file.   Social History Main Topics  . Smoking status: Current Every Day Smoker -- 0.25 packs/day for 45 years    Types: Cigarettes  . Smokeless tobacco: Never Used  . Alcohol Use: No  . Drug Use: No  . Sexual Activity: Not on file   Other Topics Concern  . Not on file   Social History Narrative  . No narrative on file   Family History  Problem Relation Age of Onset  . Heart attack Mother   . Stroke Mother   . Stroke Sister   . Diabetes      sister x3, and mother  . Lung disease Father     worked in [place] Prescriptions on File Prior to Visit  Medication Sig Dispense  Refill  . amLODipine (NORVASC) 5 MG tablet Take 1 tablet (5 mg total) by mouth daily.  90 tablet  3  . atorvastatin (LIPITOR) 40 MG tablet Take 1 tablet (40 mg total) by mouth daily.  90 tablet  3  . budesonide-formoterol (SYMBICORT) 160-4.5 MCG/ACT inhaler Inhale 2 puffs into the lungs 2 (two) times daily.  1 Inhaler  6  . Cholecalciferol 4000 UNITS CAPS Take 1 capsule by mouth daily.      . insulin NPH-regular (NOVOLIN 70/30) (70-30) 100 UNIT/ML injection Inject 30 Units into the skin daily with breakfast. 30 units in AM and 15 units in PM  40 mL  11  . Insulin Syringe-Needle U-100 (B-D INS SYR ULTRAFINE 1CC/30G) 30G X 1/2" 1 ML MISC by Does not apply route. Use as instructed twice a day      . losartan (COZAAR) 100 MG tablet Take 1 tablet (100 mg total) by mouth daily.  90 tablet  3  . omeprazole (PRILOSEC OTC) 20 MG tablet Take 20 mg by mouth daily.      Marland Kitchen Spacer/Aero-Holding Chambers (AEROCHAMBER MV) inhaler Use as instructed  1 each  0  . sucralfate (CARAFATE) 1 G tablet Take 1 tablet (1 g total) by mouth 4 (four) times daily -  with meals and at bedtime.  56 tablet  0  . tiotropium (SPIRIVA) 18 MCG inhalation capsule Place 1 capsule (18 mcg total) into inhaler and inhale daily.  30 capsule  6   No current facility-administered medications on file prior to visit.   Allergies  Allergen Reactions  . Soma [Carisoprodol]    Immunization History  Administered Date(s) Administered  . Influenza Split 08/14/2012, 07/17/2013  . Pneumococcal Polysaccharide-23 10/14/2002   Prior to Admission medications   Medication Sig Start Date End Date Taking? Authorizing Provider  amLODipine (NORVASC) 5 MG tablet Take 1 tablet (5 mg total) by mouth daily. 10/13/13  Yes Vernie Shanks, MD  atorvastatin (LIPITOR) 40 MG tablet Take 1 tablet (40 mg total) by mouth daily. 10/13/13  Yes Vernie Shanks, MD  budesonide-formoterol [facility]) 160-4.5 MCG/ACT inhaler Inhale 2 puffs into the lungs 2 (two) times  daily. 03/29/13  Yes Brand Males, MD  Cholecalciferol 4000 UNITS CAPS Take 1 capsule by mouth daily.   Yes Historical Provider, MD  insulin NPH-regular (NOVOLIN 70/30) (70-30) 100 UNIT/ML injection Inject 30 Units into the skin daily with breakfast. 30 units in AM and 15 units in PM 10/13/13  Yes Vernie Shanks, MD  Insulin Syringe-Needle U-100 (B-D INS SYR ULTRAFINE 1CC/30G) 30G X 1/2" 1 ML MISC by Does not apply route. Use as instructed twice a day   Yes Historical Provider, MD  losartan (COZAAR) 100 MG tablet Take 1 tablet (100 mg total) by mouth daily. 10/13/13  Yes Vernie Shanks, MD  omeprazole (PRILOSEC OTC) 20 MG tablet Take 20 mg by mouth daily.   Yes Historical Provider, MD  Spacer/Aero-Holding Chambers (AEROCHAMBER MV) inhaler Use as instructed 03/29/13  Yes Brand Males, MD  sucralfate (CARAFATE) 1 G tablet Take 1 tablet (1 g total) by mouth 4 (four) times daily -  with meals and at bedtime. 09/22/13  Yes Lysbeth Penner, FNP  tiotropium (SPIRIVA) 18 MCG inhalation capsule Place 1 capsule (18 mcg total) into inhaler and inhale daily. 02/02/13  Yes Brand Males, MD     ROS: As above in the HPI. All other systems are stable or negative.  OBJECTIVE: APPEARANCE:  Patient in no acute distress.The patient appeared well nourished and normally developed. Acyanotic. Waist: VITAL SIGNS:BP 130/81  Pulse 80  Temp(Src) 97.2 F (36.2 C) (Oral)  Ht '5\' 2"'  (1.575 m)  Wt 112 lb 9.6 oz (51.075 kg)  BMI 20.59 kg/m2  WF  SKIN: warm and  Dry without overt rashes, tattoos and scars  HEAD and Neck: without JVD, Head and scalp: normal Eyes:No scleral icterus. Fundi normal, eye movements normal. Ears: Auricle normal, canal normal, Tympanic membranes normal, insufflation normal. Hard of hearing. Not new Nose: normal Throat: normal Neck & thyroid: normal  CHEST & LUNGS: Chest wall: normal Lungs: Clear  CVS: Reveals the PMI to be normally located. Regular rhythm, First and  Second Heart sounds are normal,  absence of murmurs, rubs or gallops. Peripheral vasculature: Radial pulses: normal Dorsal pedis pulses: normal Posterior pulses: normal  ABDOMEN:  Appearance: normal Benign, no organomegaly, no masses, no Abdominal Aortic enlargement. No Guarding , no rebound. No Bruits. Bowel sounds: normal  RECTAL: N/A GU: N/A  EXTREMETIES: nonedematous.  MUSCULOSKELETAL:  Spine: normal Joints: intact  NEUROLOGIC: oriented to time,place and person; nonfocal. Cranial Nerves are normal.  Results for orders placed in visit on 05/14/13  CMP14+EGFR      Result Value Ref Range   Glucose 161 (*) 65 - 99 mg/dL   BUN 18  8 - 27 mg/dL   Creatinine, Ser 1.05 (*) 0.57 - 1.00 mg/dL   GFR calc non Af Amer 58 (*) >59 mL/min/1.73   GFR calc Af Amer 67  >59 mL/min/1.73   BUN/Creatinine Ratio 17  11 - 26   Sodium 137  134 - 144 mmol/L   Potassium 4.1  3.5 - 5.2 mmol/L   Chloride 100  97 - 108 mmol/L   CO2 24  18 - 29 mmol/L   Calcium 9.4  8.6 - 10.2 mg/dL   Total Protein 6.3  6.0 - 8.5 g/dL   Albumin 4.0  3.6 - 4.8 g/dL   Globulin, Total 2.3  1.5 - 4.5 g/dL   Albumin/Globulin Ratio 1.7  1.1 - 2.5   Total Bilirubin 0.4  0.0 - 1.2 mg/dL   Alkaline Phosphatase 74  39 - 117 IU/L   AST 16  0 - 40 IU/L   ALT 16  0 - 32 IU/L  NMR, LIPOPROFILE      Result Value Ref Range   LDL Particle Number [id number] (*) <1000 nmol/L   LDLC SERPL CALC-MCNC 96  <100 mg/dL   HDL Cholesterol by NMR 49  >=40 mg/dL   Triglycerides by NMR 72  <150 mg/dL   Cholesterol 159  <200 mg/dL   HDL Particle Number 35.3  >=30.5 umol/L   Small LDL Particle Number [id number] (*) <=527 nmol/L   LDL Size 20.5 (*) >20.5 nm   LP-IR Score 47 (*) <=45  VITAMIN D 25 HYDROXY      Result Value Ref Range   Vit D, 25-Hydroxy 35.4  30.0 - 100.0 ng/mL  POCT GLYCOSYLATED HEMOGLOBIN (HGB A1C)      Result Value Ref Range   Hemoglobin A1C 6.7%      ASSESSMENT:  Voiding pain - Plan: POCT urinalysis dipstick, POCT UA -  Microscopic Only, Urine culture, ciprofloxacin (CIPRO) 500 MG tablet  Smoking  Moderate COPD (chronic obstructive pulmonary disease)  Lung nodule, right upper lobe 8 mm March 2014  Hyperlipidemia - Plan: Lipid panel  HTN (hypertension) - Plan: CMP14+EGFR  Diabetes - Plan: POCT glycosylated hemoglobin (Hb A1C), CMP14+EGFR  Coronary artery calcification  Vitamin D deficiency - Plan: Vit D  25 hydroxy (rtn osteoporosis monitoring)  Hard of hearing  PLAN: Dash diet in the AVS  Discussed the health maintenance.  She has an appointment with Dr Watt Climes ,GI for colonoscope. She has to check with insurance in regards to zostavax and TDAp. Daily foot checks. Annual eye exam Recommend that she works with the audiologist in regards to her need for hearing aids Orders Placed This Encounter  Procedures  . Urine culture  . CMP14+EGFR  . Lipid panel  . Vit D  25 hydroxy (rtn osteoporosis monitoring)  . POCT urinalysis dipstick  . POCT UA - Microscopic Only  . POCT glycosylated hemoglobin (Hb A1C)   Meds ordered this encounter  Medications  . ciprofloxacin (CIPRO) 500 MG tablet    Sig: Take 1 tablet (500 mg total) by mouth 2 (two) times daily.    Dispense:  6 tablet    Refill:  0   Medications Discontinued During This Encounter  Medication Reason  . methylPREDNIsolone (MEDROL DOSPACK) 4 MG tablet Completed Course   Return in about 3 months (around 05/12/2014) for Recheck medical problems; for papsmear , Physical..  Blakely Maranan P. Jacelyn Grip, M.D.

## 2014-02-10 NOTE — Patient Instructions (Signed)
DASH Diet  The DASH diet stands for "Dietary Approaches to Stop Hypertension." It is a healthy eating plan that has been shown to reduce high blood pressure (hypertension) in as little as 14 days, while also possibly providing other significant health benefits. These other health benefits include reducing the risk of breast cancer after menopause and reducing the risk of type 2 diabetes, heart disease, colon cancer, and stroke. Health benefits also include weight loss and slowing kidney failure in patients with chronic kidney disease.   DIET GUIDELINES  · Limit salt (sodium). Your diet should contain less than 1500 mg of sodium daily.  · Limit refined or processed carbohydrates. Your diet should include mostly whole grains. Desserts and added sugars should be used sparingly.  · Include small amounts of heart-healthy fats. These types of fats include nuts, oils, and tub margarine. Limit saturated and trans fats. These fats have been shown to be harmful in the body.  CHOOSING FOODS   The following food groups are based on a 2000 calorie diet. See your Registered Dietitian for individual calorie needs.  Grains and Grain Products (6 to 8 servings daily)  · Eat More Often: Whole-wheat bread, brown rice, whole-grain or wheat pasta, quinoa, popcorn without added fat or salt (air popped).  · Eat Less Often: White bread, white pasta, white rice, cornbread.  Vegetables (4 to 5 servings daily)  · Eat More Often: Fresh, frozen, and canned vegetables. Vegetables may be raw, steamed, roasted, or grilled with a minimal amount of fat.  · Eat Less Often/Avoid: Creamed or fried vegetables. Vegetables in a cheese sauce.  Fruit (4 to 5 servings daily)  · Eat More Often: All fresh, canned (in natural juice), or frozen fruits. Dried fruits without added sugar. One hundred percent fruit juice (½ cup [237 mL] daily).  · Eat Less Often: Dried fruits with added sugar. Canned fruit in light or heavy syrup.  Lean Meats, Fish, and Poultry (2  servings or less daily. One serving is 3 to 4 oz [85-114 g]).  · Eat More Often: Ninety percent or leaner ground beef, tenderloin, sirloin. Round cuts of beef, chicken breast, turkey breast. All fish. Grill, bake, or broil your meat. Nothing should be fried.  · Eat Less Often/Avoid: Fatty cuts of meat, turkey, or chicken leg, thigh, or wing. Fried cuts of meat or fish.  Dairy (2 to 3 servings)  · Eat More Often: Low-fat or fat-free milk, low-fat plain or light yogurt, reduced-fat or part-skim cheese.  · Eat Less Often/Avoid: Milk (whole, 2%). Whole milk yogurt. Full-fat cheeses.  Nuts, Seeds, and Legumes (4 to 5 servings per week)  · Eat More Often: All without added salt.  · Eat Less Often/Avoid: Salted nuts and seeds, canned beans with added salt.  Fats and Sweets (limited)  · Eat More Often: Vegetable oils, tub margarines without trans fats, sugar-free gelatin. Mayonnaise and salad dressings.  · Eat Less Often/Avoid: Coconut oils, palm oils, butter, stick margarine, cream, half and half, cookies, candy, pie.  FOR MORE INFORMATION  The Dash Diet Eating Plan: www.dashdiet.org  Document Released: 09/19/2011 Document Revised: 12/23/2011 Document Reviewed: 09/19/2011  ExitCare® Patient Information ©2014 [employer].

## 2014-02-11 LAB — CMP14+EGFR
ALT: 13 IU/L (ref 0–32)
AST: 15 IU/L (ref 0–40)
Albumin/Globulin Ratio: 2 (ref 1.1–2.5)
Albumin: 4.1 g/dL (ref 3.6–4.8)
Alkaline Phosphatase: 80 IU/L (ref 39–117)
BUN/Creatinine Ratio: 20 (ref 11–26)
BUN: 21 mg/dL (ref 8–27)
CO2: 24 mmol/L (ref 18–29)
Calcium: 9.5 mg/dL (ref 8.7–10.3)
Chloride: 100 mmol/L (ref 97–108)
Creatinine, Ser: 1.06 mg/dL — ABNORMAL HIGH (ref 0.57–1.00)
GFR calc Af Amer: 65 mL/min/{1.73_m2} (ref >59)
GFR calc non Af Amer: 57 mL/min/{1.73_m2} — ABNORMAL LOW (ref >59)
Globulin, Total: 2.1 g/dL (ref 1.5–4.5)
Glucose: 110 mg/dL — ABNORMAL HIGH (ref 65–99)
Potassium: 4.9 mmol/L (ref 3.5–5.2)
Sodium: 139 mmol/L (ref 134–144)
Total Bilirubin: 0.4 mg/dL (ref 0.0–1.2)
Total Protein: 6.2 g/dL (ref 6.0–8.5)

## 2014-02-11 LAB — LIPID PANEL
Chol/HDL Ratio: 2.4 ratio units (ref 0.0–4.4)
Cholesterol, Total: 140 mg/dL (ref 100–199)
HDL: 58 mg/dL (ref >39)
LDL Calculated: 69 mg/dL (ref 0–99)
Triglycerides: 65 mg/dL (ref 0–149)
VLDL Cholesterol Cal: 13 mg/dL (ref 5–40)

## 2014-02-11 LAB — VITAMIN D 25 HYDROXY (VIT D DEFICIENCY, FRACTURES): Vit D, 25-Hydroxy: 27.8 ng/mL — ABNORMAL LOW (ref 30.0–100.0)

## 2014-02-12 LAB — URINE CULTURE

## 2014-02-13 ENCOUNTER — Other Ambulatory Visit: Payer: Self-pay | Admitting: Family Medicine

## 2014-02-13 DIAGNOSIS — R309 Painful micturition, unspecified: Secondary | ICD-10-CM

## 2014-02-13 MED ORDER — CIPROFLOXACIN HCL 500 MG PO TABS: 500.0000 mg | ORAL_TABLET | Freq: Two times a day (BID) | ORAL | Status: DC

## 2014-02-13 NOTE — Progress Notes (Signed)
Quick Note:  Call Patient Labs that are abnormal:  Urine culture was positive for a significant infection.  The rest are at goal  Recommendations: Will need to use antibiotic for 1 week more to ensure the infection is cleared.ordered the extra in EPIC.   ______

## 2014-02-14 ENCOUNTER — Telehealth: Payer: Self-pay | Admitting: Family Medicine

## 2014-02-14 NOTE — Telephone Encounter (Signed)
Message copied by Waverly Ferrari on Mon Feb 14, 2014 10:50 AM ------      Message from: Vernie Shanks      Created: Sun Feb 13, 2014  9:18 PM       Call Patient      Labs that are abnormal:            Urine culture was positive for a significant infection.       The rest are at goal            Recommendations:      Will need to use antibiotic for 1 week more to ensure the infection is cleared.ordered the extra in EPIC.             ------

## 2014-04-12 ENCOUNTER — Telehealth: Payer: Self-pay | Admitting: Family Medicine

## 2014-05-12 ENCOUNTER — Ambulatory Visit: Payer: Commercial Managed Care - HMO | Admitting: Family

## 2014-07-11 ENCOUNTER — Ambulatory Visit (INDEPENDENT_AMBULATORY_CARE_PROVIDER_SITE_OTHER): Payer: Commercial Managed Care - HMO

## 2014-07-11 DIAGNOSIS — Z23 Encounter for immunization: Secondary | ICD-10-CM

## 2014-07-26 ENCOUNTER — Ambulatory Visit (INDEPENDENT_AMBULATORY_CARE_PROVIDER_SITE_OTHER): Payer: Commercial Managed Care - HMO | Admitting: Nurse Practitioner

## 2014-07-26 ENCOUNTER — Encounter: Payer: Self-pay | Admitting: Nurse Practitioner

## 2014-07-26 VITALS — BP 137/80 | HR 97 | Temp 98.0°F | Ht 62.0 in | Wt 108.0 lb

## 2014-07-26 DIAGNOSIS — J069 Acute upper respiratory infection, unspecified: Secondary | ICD-10-CM

## 2014-07-26 MED ORDER — BUDESONIDE-FORMOTEROL FUMARATE 160-4.5 MCG/ACT IN AERO: 2.0000 | INHALATION_SPRAY | Freq: Two times a day (BID) | RESPIRATORY_TRACT | Status: DC

## 2014-07-26 MED ORDER — AZITHROMYCIN 250 MG PO TABS: ORAL_TABLET | ORAL | Status: DC

## 2014-07-26 NOTE — Progress Notes (Signed)
   Subjective:    Patient ID: Jamie [name], female    DOB: 20-Sep-1951, 63 y.o.   MRN: [id number]  HPI Patient in today c/o cough, congestion and sneezing- started 3 days ago- OTC meds have been no help.    Review of Systems  Constitutional: Negative for fever, chills, appetite change and fatigue.  HENT: Positive for congestion, postnasal drip, rhinorrhea and sneezing. Negative for sore throat.   Respiratory: Positive for cough.   Gastrointestinal: Negative.   Neurological: Negative.   Psychiatric/Behavioral: Negative.   All other systems reviewed and are negative.      Objective:   Physical Exam  Constitutional: She is oriented to person, place, and time. She appears well-developed and well-nourished. No distress.  HENT:  Right Ear: Hearing, tympanic membrane, external ear and ear canal normal.  Left Ear: Hearing, tympanic membrane, external ear and ear canal normal.  Nose: Mucosal edema and rhinorrhea present. Right sinus exhibits maxillary sinus tenderness. Right sinus exhibits no frontal sinus tenderness. Left sinus exhibits maxillary sinus tenderness.  Mouth/Throat: Uvula is midline, oropharynx is clear and moist and mucous membranes are normal.  Neck: Normal range of motion.  Pulmonary/Chest: Effort normal and breath sounds normal.  Tight cough  Lymphadenopathy:    She has no cervical adenopathy.  Neurological: She is alert and oriented to person, place, and time.  Skin: Skin is warm and dry.  Psychiatric: She has a normal mood and affect. Her behavior is normal. Judgment and thought content normal.   BP 137/80  Pulse 97  Temp(Src) 98 F (36.7 C) (Oral)  Ht 5\' 2"  (1.575 m)  Wt 108 lb (48.988 kg)  BMI 19.75 kg/m2        Assessment & Plan:   1. Upper respiratory infection with cough and congestion    Meds ordered this encounter  Medications  . budesonide-formoterol (SYMBICORT) 160-4.5 MCG/ACT inhaler    Sig: Inhale 2 puffs into the lungs 2 (two) times daily.     Dispense:  1 Inhaler    Refill:  6  . azithromycin (ZITHROMAX Z-PAK) 250 MG tablet    Sig: As directed    Dispense:  6 each    Refill:  0    Order Specific Question:  Supervising Provider    Answer:  Chipper Herb [[id number]]   1. Take meds as prescribed 2. Use a cool mist humidifier especially during the winter months and when heat has been humid. 3. Use saline nose sprays frequently 4. Saline irrigations of the nose can be very helpful if done frequently.  * 4X daily for 1 week*  * Use of a nettie pot can be helpful with this. Follow directions with this* 5. Drink plenty of fluids 6. Keep thermostat turn down low 7.For any cough or congestion  Use plain Mucinex- regular strength or max strength is fine   * Children- consult with Pharmacist for dosing 8. For fever or aces or pains- take tylenol or ibuprofen appropriate for age and weight.  * for fevers greater than 101 orally you may alternate ibuprofen and tylenol every  3 hours.   Mary-Margaret Hassell Done, FNP

## 2014-07-26 NOTE — Addendum Note (Signed)
Addended by: Chevis Pretty on: 07/26/2014 05:34 PM   Modules accepted: Orders

## 2014-07-26 NOTE — Patient Instructions (Signed)

## 2014-07-28 LAB — HM DIABETES EYE EXAM

## 2014-08-16 ENCOUNTER — Encounter: Payer: Self-pay | Admitting: *Deleted

## 2014-08-18 ENCOUNTER — Telehealth: Payer: Self-pay | Admitting: Family Medicine

## 2014-08-18 NOTE — Telephone Encounter (Signed)
Pt given appt with miller 09/30/14 @9am 

## 2014-09-30 ENCOUNTER — Encounter: Payer: Self-pay | Admitting: Family Medicine

## 2014-09-30 ENCOUNTER — Ambulatory Visit (INDEPENDENT_AMBULATORY_CARE_PROVIDER_SITE_OTHER): Payer: Commercial Managed Care - HMO | Admitting: Family Medicine

## 2014-09-30 ENCOUNTER — Other Ambulatory Visit: Payer: Self-pay | Admitting: *Deleted

## 2014-09-30 VITALS — BP 135/78 | HR 84 | Temp 97.2°F | Ht 62.0 in | Wt 106.4 lb

## 2014-09-30 DIAGNOSIS — I10 Essential (primary) hypertension: Secondary | ICD-10-CM

## 2014-09-30 DIAGNOSIS — G8929 Other chronic pain: Secondary | ICD-10-CM

## 2014-09-30 DIAGNOSIS — E118 Type 2 diabetes mellitus with unspecified complications: Secondary | ICD-10-CM

## 2014-09-30 DIAGNOSIS — E1065 Type 1 diabetes mellitus with hyperglycemia: Secondary | ICD-10-CM

## 2014-09-30 DIAGNOSIS — E1069 Type 1 diabetes mellitus with other specified complication: Secondary | ICD-10-CM

## 2014-09-30 DIAGNOSIS — E108 Type 1 diabetes mellitus with unspecified complications: Secondary | ICD-10-CM

## 2014-09-30 DIAGNOSIS — IMO0002 Reserved for concepts with insufficient information to code with codable children: Secondary | ICD-10-CM

## 2014-09-30 DIAGNOSIS — E785 Hyperlipidemia, unspecified: Secondary | ICD-10-CM

## 2014-09-30 DIAGNOSIS — R1013 Epigastric pain: Secondary | ICD-10-CM

## 2014-09-30 LAB — POCT GLYCOSYLATED HEMOGLOBIN (HGB A1C): HEMOGLOBIN A1C: 6.9

## 2014-09-30 MED ORDER — BUDESONIDE-FORMOTEROL FUMARATE 160-4.5 MCG/ACT IN AERO: 2.0000 | INHALATION_SPRAY | Freq: Two times a day (BID) | RESPIRATORY_TRACT | Status: DC

## 2014-09-30 MED ORDER — LOSARTAN POTASSIUM 100 MG PO TABS: 100.0000 mg | ORAL_TABLET | Freq: Every day | ORAL | Status: DC

## 2014-09-30 MED ORDER — ACCU-CHEK AVIVA PLUS W/DEVICE KIT: 1.0000 | PACK | Freq: Two times a day (BID) | Status: DC

## 2014-09-30 MED ORDER — ATORVASTATIN CALCIUM 40 MG PO TABS: 40.0000 mg | ORAL_TABLET | Freq: Every day | ORAL | Status: DC

## 2014-09-30 MED ORDER — AMLODIPINE BESYLATE 5 MG PO TABS: 5.0000 mg | ORAL_TABLET | Freq: Every day | ORAL | Status: DC

## 2014-09-30 MED ORDER — UMECLIDINIUM BROMIDE 62.5 MCG/INH IN AEPB: 1.0000 | INHALATION_SPRAY | RESPIRATORY_TRACT | Status: DC

## 2014-09-30 MED ORDER — ACCU-CHEK FASTCLIX LANCETS MISC: 1.0000 | Freq: Two times a day (BID) | Status: DC

## 2014-09-30 MED ORDER — SUCRALFATE 1 G PO TABS: 1.0000 g | ORAL_TABLET | Freq: Three times a day (TID) | ORAL | Status: DC

## 2014-09-30 MED ORDER — GLUCOSE BLOOD VI STRP: ORAL_STRIP | Status: DC

## 2014-09-30 MED ORDER — TIOTROPIUM BROMIDE MONOHYDRATE 18 MCG IN CAPS: 18.0000 ug | ORAL_CAPSULE | Freq: Every day | RESPIRATORY_TRACT | Status: DC

## 2014-09-30 MED ORDER — INSULIN NPH ISOPHANE & REGULAR (70-30) 100 UNIT/ML ~~LOC~~ SUSP: 30.0000 [IU] | Freq: Every day | SUBCUTANEOUS | Status: DC

## 2014-09-30 MED ORDER — OMEPRAZOLE MAGNESIUM 20 MG PO TBEC: 20.0000 mg | DELAYED_RELEASE_TABLET | Freq: Every day | ORAL | Status: DC

## 2014-09-30 MED ORDER — "INSULIN SYRINGE-NEEDLE U-100 30G X 1/2"" 1 ML MISC": 1.0000 | Freq: Two times a day (BID) | Status: DC

## 2014-09-30 MED ORDER — CHOLECALCIFEROL 100 MCG (4000 UT) PO CAPS: 1.0000 | ORAL_CAPSULE | Freq: Every day | ORAL | Status: DC

## 2014-09-30 NOTE — Progress Notes (Signed)
   Subjective:    Patient ID: Jamie [name], female    DOB: 05/15/1951, 63 y.o.   MRN: [id number]  HPI 63 year old female with COPD, diabetes, hyperlipidemia, and hypertension. She is hard of hearing and could probably benefit from an aide but if one speaks loudly in her right ear communication is doable. She's had no recent exacerbations of her COPD. She does complain of some low blood sugars at night and currently takes insulin 7030, 30 units in a.m. and 15 units at 9 PM. She only uses Symbicort for her COPD having run out of Spiriva. There is also mention of a right lung nodule that is followed with serial CT scans by a pulmonologist in [facility] and she is scheduled for follow-up yearly    Review of Systems  Constitutional: Negative.   HENT: Negative.   Eyes: Negative.   Respiratory: Positive for shortness of breath.   Cardiovascular: Negative.   Gastrointestinal: Negative.   Endocrine: Negative.   Genitourinary: Negative.   Hematological: Negative.   Psychiatric/Behavioral: Negative.        Objective:   Physical Exam  Constitutional: She is oriented to person, place, and time. She appears well-developed.  Slight stature (at risk for osteoporosis)  Neck: Normal range of motion.  Cardiovascular: Normal rate and regular rhythm.   Pulmonary/Chest: Effort normal. She has wheezes.  Abdominal: Soft.  Neurological: She is alert and oriented to person, place, and time. She has normal reflexes.   BP 135/78 mmHg  Pulse 84  Temp(Src) 97.2 F (36.2 C) (Oral)  Ht 5\' 2"  (1.575 m)  Wt 106 lb 6.4 oz (48.263 kg)  BMI 19.46 kg/m2       Assessment & Plan:  1. Essential hypertension   2. HLD (hyperlipidemia) Check lipids at next visit in 4 months  3. Abdominal pain, chronic, epigastric No complaints today  4.Diabetes Suggest reducing nighttime dose of 7030 from 15 to 10 units  Wardell Honour MD

## 2014-12-12 ENCOUNTER — Encounter: Payer: Self-pay | Admitting: Family

## 2014-12-12 ENCOUNTER — Ambulatory Visit (INDEPENDENT_AMBULATORY_CARE_PROVIDER_SITE_OTHER): Payer: Commercial Managed Care - HMO

## 2014-12-12 ENCOUNTER — Ambulatory Visit (INDEPENDENT_AMBULATORY_CARE_PROVIDER_SITE_OTHER): Payer: Commercial Managed Care - HMO | Admitting: Family

## 2014-12-12 VITALS — BP 157/102 | HR 122 | Temp 97.3°F | Ht 62.0 in | Wt 103.2 lb

## 2014-12-12 DIAGNOSIS — J449 Chronic obstructive pulmonary disease, unspecified: Secondary | ICD-10-CM | POA: Diagnosis not present

## 2014-12-12 DIAGNOSIS — R062 Wheezing: Secondary | ICD-10-CM | POA: Diagnosis not present

## 2014-12-12 DIAGNOSIS — M545 Low back pain: Secondary | ICD-10-CM

## 2014-12-12 MED ORDER — ALBUTEROL SULFATE HFA 108 (90 BASE) MCG/ACT IN AERS: 2.0000 | INHALATION_SPRAY | Freq: Four times a day (QID) | RESPIRATORY_TRACT | Status: DC | PRN

## 2014-12-12 MED ORDER — HYDROCODONE-ACETAMINOPHEN 5-325 MG PO TABS: 1.0000 | ORAL_TABLET | Freq: Four times a day (QID) | ORAL | Status: DC | PRN

## 2014-12-12 MED ORDER — KETOROLAC TROMETHAMINE 30 MG/ML IJ SOLN
30.0000 mg | Freq: Once | INTRAMUSCULAR | Status: AC
  Administered 2014-12-12: 30 mg via INTRAMUSCULAR

## 2014-12-12 MED ORDER — METHYLPREDNISOLONE ACETATE 80 MG/ML IJ SUSP
80.0000 mg | Freq: Once | INTRAMUSCULAR | Status: AC
  Administered 2014-12-12: 80 mg via INTRAMUSCULAR

## 2014-12-12 NOTE — Patient Instructions (Addendum)
Back Pain, Adult Low back pain is very common. About 1 in 5 people have back pain.The cause of low back pain is rarely dangerous. The pain often gets better over time.About half of people with a sudden onset of back pain feel better in just 2 weeks. About 8 in 10 people feel better by 6 weeks.  CAUSES Some common causes of back pain include:  Strain of the muscles or ligaments supporting the spine.  Wear and tear (degeneration) of the spinal discs.  Arthritis.  Direct injury to the back. DIAGNOSIS Most of the time, the direct cause of low back pain is not known.However, back pain can be treated effectively even when the exact cause of the pain is unknown.Answering your caregiver's questions about your overall health and symptoms is one of the most accurate ways to make sure the cause of your pain is not dangerous. If your caregiver needs more information, he or she may order lab work or imaging tests (X-rays or MRIs).However, even if imaging tests show changes in your back, this usually does not require surgery. HOME CARE INSTRUCTIONS For many people, back pain returns.Since low back pain is rarely dangerous, it is often a condition that people can learn to manageon their own.   Remain active. It is stressful on the back to sit or stand in one place. Do not sit, drive, or stand in one place for more than 30 minutes at a time. Take short walks on level surfaces as soon as pain allows.Try to increase the length of time you walk each day.  Do not stay in bed.Resting more than 1 or 2 days can delay your recovery.  Do not avoid exercise or work.Your body is made to move.It is not dangerous to be active, even though your back may hurt.Your back will likely heal faster if you return to being active before your pain is gone.  Pay attention to your body when you bend and lift. Many people have less discomfortwhen lifting if they bend their knees, keep the load close to their bodies,and  avoid twisting. Often, the most comfortable positions are those that put less stress on your recovering back.  Find a comfortable position to sleep. Use a firm mattress and lie on your side with your knees slightly bent. If you lie on your back, put a pillow under your knees.  Only take over-the-counter or prescription medicines as directed by your caregiver. Over-the-counter medicines to reduce pain and inflammation are often the most helpful.Your caregiver may prescribe muscle relaxant drugs.These medicines help dull your pain so you can more quickly return to your normal activities and healthy exercise.  Put ice on the injured area.  Put ice in a plastic bag.  Place a towel between your skin and the bag.  Leave the ice on for 15-20 minutes, 03-04 times a day for the first 2 to 3 days. After that, ice and heat may be alternated to reduce pain and spasms.  Ask your caregiver about trying back exercises and gentle massage. This may be of some benefit.  Avoid feeling anxious or stressed.Stress increases muscle tension and can worsen back pain.It is important to recognize when you are anxious or stressed and learn ways to manage it.Exercise is a great option. SEEK MEDICAL CARE IF:  You have pain that is not relieved with rest or medicine.  You have pain that does not improve in 1 week.  You have new symptoms.  You are generally not feeling well. SEEK   IMMEDIATE MEDICAL CARE IF:   You have pain that radiates from your back into your legs.  You develop new bowel or bladder control problems.  You have unusual weakness or numbness in your arms or legs.  You develop nausea or vomiting.  You develop abdominal pain.  You feel faint. Document Released: 09/30/2005 Document Revised: 03/31/2012 Document Reviewed: 02/01/2014 [facility] Patient Information 2015 [place], [place]. This information is not intended to replace advice given to you by your health care provider. Make sure you  discuss any questions you have with your health care provider. Chronic Obstructive Pulmonary Disease Chronic obstructive pulmonary disease (COPD) is a common lung condition in which airflow from the lungs is limited. COPD is a general term that can be used to describe many different lung problems that limit airflow, including both chronic bronchitis and emphysema. If you have COPD, your lung function will probably never return to normal, but there are measures you can take to improve lung function and make yourself feel better.  CAUSES   Smoking (common).   Exposure to secondhand smoke.   Genetic problems.  Chronic inflammatory lung diseases or recurrent infections. SYMPTOMS   Shortness of breath, especially with physical activity.   Deep, persistent (chronic) cough with a large amount of thick mucus.   Wheezing.   Rapid breaths (tachypnea).   Gray or bluish discoloration (cyanosis) of the skin, especially in fingers, toes, or lips.   Fatigue.   Weight loss.   Frequent infections or episodes when breathing symptoms become much worse (exacerbations).   Chest tightness. DIAGNOSIS  Your health care provider will take a medical history and perform a physical examination to make the initial diagnosis. Additional tests for COPD may include:   Lung (pulmonary) function tests.  Chest X-ray.  CT scan.  Blood tests. TREATMENT  Treatment available to help you feel better when you have COPD includes:   Inhaler and nebulizer medicines. These help manage the symptoms of COPD and make your breathing more comfortable.  Supplemental oxygen. Supplemental oxygen is only helpful if you have a low oxygen level in your blood.   Exercise and physical activity. These are beneficial for nearly all people with COPD. Some people may also benefit from a pulmonary rehabilitation program. HOME CARE INSTRUCTIONS   Take all medicines (inhaled or pills) as directed by your health care  provider.  Avoid over-the-counter medicines or cough syrups that dry up your airway (such as antihistamines) and slow down the elimination of secretions unless instructed otherwise by your health care provider.   If you are a smoker, the most important thing that you can do is stop smoking. Continuing to smoke will cause further lung damage and breathing trouble. Ask your health care provider for help with quitting smoking. He or she can direct you to community resources or hospitals that provide support.  Avoid exposure to irritants such as smoke, chemicals, and fumes that aggravate your breathing.  Use oxygen therapy and pulmonary rehabilitation if directed by your health care provider. If you require home oxygen therapy, ask your health care provider whether you should purchase a pulse oximeter to measure your oxygen level at home.   Avoid contact with individuals who have a contagious illness.  Avoid extreme temperature and humidity changes.  Eat healthy foods. Eating smaller, more frequent meals and resting before meals may help you maintain your strength.  Stay active, but balance activity with periods of rest. Exercise and physical activity will help you maintain your ability to  do things you want to do.  Preventing infection and hospitalization is very important when you have COPD. Make sure to receive all the vaccines your health care provider recommends, especially the pneumococcal and influenza vaccines. Ask your health care provider whether you need a pneumonia vaccine.  Learn and use relaxation techniques to manage stress.  Learn and use controlled breathing techniques as directed by your health care provider. Controlled breathing techniques include:   Pursed lip breathing. Start by breathing in (inhaling) through your nose for 1 second. Then, purse your lips as if you were going to whistle and breathe out (exhale) through the pursed lips for 2 seconds.   Diaphragmatic  breathing. Start by putting one hand on your abdomen just above your waist. Inhale slowly through your nose. The hand on your abdomen should move out. Then purse your lips and exhale slowly. You should be able to feel the hand on your abdomen moving in as you exhale.   Learn and use controlled coughing to clear mucus from your lungs. Controlled coughing is a series of short, progressive coughs. The steps of controlled coughing are:  1. Lean your head slightly forward.  2. Breathe in deeply using diaphragmatic breathing.  3. Try to hold your breath for 3 seconds.  4. Keep your mouth slightly open while coughing twice.  5. Spit any mucus out into a tissue.  6. Rest and repeat the steps once or twice as needed. SEEK MEDICAL CARE IF:   You are coughing up more mucus than usual.   There is a change in the color or thickness of your mucus.   Your breathing is more labored than usual.   Your breathing is faster than usual.  SEEK IMMEDIATE MEDICAL CARE IF:   You have shortness of breath while you are resting.   You have shortness of breath that prevents you from:  Being able to talk.   Performing your usual physical activities.   You have chest pain lasting longer than 5 minutes.   Your skin color is more cyanotic than usual.  You measure low oxygen saturations for longer than 5 minutes with a pulse oximeter. MAKE SURE YOU:   Understand these instructions.  Will watch your condition.  Will get help right away if you are not doing well or get worse. Document Released: 07/10/2005 Document Revised: 02/14/2014 Document Reviewed: 05/27/2013 [facility] Patient Information 2015 [place], [place]. This information is not intended to replace advice given to you by your health care provider. Make sure you discuss any questions you have with your health care provider.

## 2014-12-12 NOTE — Progress Notes (Signed)
Subjective:    Patient ID: Jamie [name], female    DOB: Mar 30, 1951, 64 y.o.   MRN: [id number]  Back Pain This is a new problem. The current episode started 1 to 4 weeks ago (Friday). The problem occurs constantly. The problem has been waxing and waning since onset. The pain is present in the lumbar spine. The quality of the pain is described as aching and shooting. Radiates to: Right groin. The pain is at a severity of 10/10. The pain is severe. The symptoms are aggravated by sitting. Associated symptoms include tingling and weakness. Pertinent negatives include no bladder incontinence, bowel incontinence, dysuria, headaches or pelvic pain. She has tried bed rest, NSAIDs and heat for the symptoms. The treatment provided mild relief.   Pt was dx with a budging disc in 2011 and was given "shots in my back" and it helped. I just need some pain medication until I can get in and see my neruosurgeron.    Review of Systems  HENT: Negative.   Eyes: Negative.   Respiratory: Negative.  Negative for shortness of breath.   Cardiovascular: Negative.  Negative for palpitations.  Gastrointestinal: Negative.  Negative for bowel incontinence.  Endocrine: Negative.   Genitourinary: Negative.  Negative for bladder incontinence, dysuria and pelvic pain.  Musculoskeletal: Positive for back pain.  Neurological: Positive for tingling and weakness. Negative for headaches.  Hematological: Negative.   Psychiatric/Behavioral: Negative.   All other systems reviewed and are negative.      Objective:   Physical Exam  Constitutional: She is oriented to person, place, and time. She appears well-developed and well-nourished. No distress.  HENT:  Head: Normocephalic and atraumatic.  Right Ear: External ear normal.  Left Ear: External ear normal.  Nose: Nose normal.  Mouth/Throat: Oropharynx is clear and moist.  Eyes: Pupils are equal, round, and reactive to light.  Neck: Normal range of motion. Neck supple. No  thyromegaly present.  Cardiovascular: Normal rate, regular rhythm, normal heart sounds and intact distal pulses.   No murmur heard. Pulmonary/Chest: Effort normal. No respiratory distress. She has wheezes.  Abdominal: Soft. Bowel sounds are normal. She exhibits no distension. There is no tenderness.  Musculoskeletal: Normal range of motion. She exhibits no edema or tenderness.  Neurological: She is alert and oriented to person, place, and time. She has normal reflexes. No cranial nerve deficit.  Skin: Skin is warm and dry.  Psychiatric: She has a normal mood and affect. Her behavior is normal. Judgment and thought content normal.  Vitals reviewed.  BP 157/102 mmHg  Pulse 122  Temp(Src) 97.3 F (36.3 C) (Oral)  Ht 5\' 2"  (1.575 m)  Wt 103 lb 3.2 oz (46.811 kg)  BMI 18.87 kg/m2  SpO2 94%   Chest X-ray- WNL Preliminary reading by Evelina Dun, FNP [facility]      Assessment & Plan:  1. Right low back pain, with sciatica presence unspecified -Rest -Pt to make appt to see nuerosurgeron - ketorolac (TORADOL) 30 MG/ML injection 30 mg; Inject 1 mL (30 mg total) into the muscle once. - methylPREDNISolone acetate (DEPO-MEDROL) injection 80 mg; Inject 1 mL (80 mg total) into the muscle once. - HYDROcodone-acetaminophen (NORCO/VICODIN) 5-325 MG per tablet; Take 1 tablet by mouth every 6 (six) hours as needed for moderate pain.  Dispense: 60 tablet; Refill: 0  2. Wheezing -Use inhalers as scheduled _Smoking cessation discussed - DG Chest 2 View; Future - albuterol (PROVENTIL HFA;VENTOLIN HFA) 108 (90 BASE) MCG/ACT inhaler; Inhale 2 puffs into the lungs  every 6 (six) hours as needed for wheezing or shortness of breath.  Dispense: 1 Inhaler; Refill: Davis, FNP

## 2014-12-14 ENCOUNTER — Telehealth: Payer: Self-pay

## 2014-12-14 DIAGNOSIS — M545 Low back pain: Secondary | ICD-10-CM

## 2014-12-14 NOTE — Telephone Encounter (Signed)
Wants a referral to Dr Joya Salm for her back

## 2014-12-19 ENCOUNTER — Encounter: Payer: Self-pay | Admitting: Family

## 2014-12-19 ENCOUNTER — Ambulatory Visit (INDEPENDENT_AMBULATORY_CARE_PROVIDER_SITE_OTHER): Payer: Commercial Managed Care - HMO | Admitting: Family

## 2014-12-19 VITALS — BP 172/89 | HR 105 | Temp 99.3°F | Ht 62.0 in | Wt 104.2 lb

## 2014-12-19 DIAGNOSIS — N390 Urinary tract infection, site not specified: Secondary | ICD-10-CM

## 2014-12-19 DIAGNOSIS — J449 Chronic obstructive pulmonary disease, unspecified: Secondary | ICD-10-CM | POA: Diagnosis not present

## 2014-12-19 DIAGNOSIS — R1031 Right lower quadrant pain: Secondary | ICD-10-CM | POA: Diagnosis not present

## 2014-12-19 DIAGNOSIS — R319 Hematuria, unspecified: Secondary | ICD-10-CM

## 2014-12-19 LAB — POCT URINALYSIS DIPSTICK
Bilirubin, UA: NEGATIVE
Ketones, UA: NEGATIVE
NITRITE UA: POSITIVE
Spec Grav, UA: 1.02
UROBILINOGEN UA: NEGATIVE
pH, UA: 5

## 2014-12-19 MED ORDER — CIPROFLOXACIN HCL 500 MG PO TABS
500.0000 mg | ORAL_TABLET | Freq: Two times a day (BID) | ORAL | Status: DC
Start: 2014-12-19 — End: 2015-02-13

## 2014-12-19 NOTE — Addendum Note (Signed)
Addended by: Earlene Plater on: 12/19/2014 04:35 PM   Modules accepted: Orders

## 2014-12-19 NOTE — Addendum Note (Signed)
Addended by: Earlene Plater on: 12/19/2014 04:36 PM   Modules accepted: Orders

## 2014-12-19 NOTE — Progress Notes (Addendum)
   Subjective:    Patient ID: Jamie [name], female    DOB: 01-Oct-1951, 64 y.o.   MRN: [id number]  Pt presents to office for back pain and right lower side pain.  Abdominal Pain This is a recurrent problem. The current episode started 1 to 4 weeks ago. The onset quality is gradual. The problem occurs constantly. The problem has been waxing and waning. The pain is located in the RLQ. The pain is at a severity of 10/10. The pain is moderate. The quality of the pain is aching and cramping. The abdominal pain radiates to the back. Associated symptoms include nausea. Pertinent negatives include no constipation, diarrhea, frequency, headaches or vomiting. Associated symptoms comments: Hesitancy  and urgency . She has tried acetaminophen Ethlyn Gallery) for the symptoms. The treatment provided mild relief.      Review of Systems  Constitutional: Negative.   HENT: Negative.   Eyes: Negative.   Respiratory: Negative.  Negative for shortness of breath.   Cardiovascular: Negative.  Negative for palpitations.  Gastrointestinal: Positive for nausea and abdominal pain. Negative for vomiting, diarrhea and constipation.  Endocrine: Negative.   Genitourinary: Negative.  Negative for frequency.  Musculoskeletal: Negative.   Neurological: Negative.  Negative for headaches.  Hematological: Negative.   Psychiatric/Behavioral: Negative.   All other systems reviewed and are negative.      Objective:   Physical Exam  Constitutional: She is oriented to person, place, and time. She appears well-developed and well-nourished. No distress.  Eyes: Pupils are equal, round, and reactive to light.  Neck: Normal range of motion. Neck supple. No thyromegaly present.  Cardiovascular: Normal rate, regular rhythm, normal heart sounds and intact distal pulses.   No murmur heard. Pulmonary/Chest: Effort normal. No respiratory distress. She has wheezes.  Coarse breathing sounds and SOB on exertion   Abdominal: Soft. Bowel  sounds are normal. She exhibits no distension. There is tenderness (RLQ and LLQ).  Musculoskeletal: Normal range of motion. She exhibits no edema or tenderness.   CVA tenderness   Neurological: She is alert and oriented to person, place, and time. She has normal reflexes. No cranial nerve deficit.  Skin: Skin is warm and dry.  Psychiatric: She has a normal mood and affect. Her behavior is normal. Judgment and thought content normal.  Vitals reviewed.     BP 172/89 mmHg  Pulse 105  Temp(Src) 99.3 F (37.4 C) (Oral)  Ht $R'5\' 2"'Gd$  (1.575 m)  Wt 104 lb 3.2 oz (47.265 kg)  BMI 19.05 kg/m2     Assessment & Plan:  1. Right lower quadrant abdominal pain - POCT urinalysis dipstick - Urine culture  2. Moderate COPD (chronic obstructive pulmonary disease) - CMP14+EGFR - Ambulatory referral to Pulmonology  3. Urinary tract infection with hematuria, site unspecified -Force fluids AZO over the counter X2 days RTO 10 days Culture pending - ciprofloxacin (CIPRO) 500 MG tablet; Take 1 tablet (500 mg total) by mouth 2 (two) times daily.  Dispense: 20 tablet; Refill: 0 - Urine culture  Evelina Dun, FNP

## 2014-12-19 NOTE — Patient Instructions (Addendum)
Chronic Obstructive Pulmonary Disease Chronic obstructive pulmonary disease (COPD) is a common lung condition in which airflow from the lungs is limited. COPD is a general term that can be used to describe many different lung problems that limit airflow, including both chronic bronchitis and emphysema. If you have COPD, your lung function will probably never return to normal, but there are measures you can take to improve lung function and make yourself feel better.  CAUSES   Smoking (common).   Exposure to secondhand smoke.   Genetic problems.  Chronic inflammatory lung diseases or recurrent infections. SYMPTOMS   Shortness of breath, especially with physical activity.   Deep, persistent (chronic) cough with a large amount of thick mucus.   Wheezing.   Rapid breaths (tachypnea).   Gray or bluish discoloration (cyanosis) of the skin, especially in fingers, toes, or lips.   Fatigue.   Weight loss.   Frequent infections or episodes when breathing symptoms become much worse (exacerbations).   Chest tightness. DIAGNOSIS  Your health care provider will take a medical history and perform a physical examination to make the initial diagnosis. Additional tests for COPD may include:   Lung (pulmonary) function tests.  Chest X-ray.  CT scan.  Blood tests. TREATMENT  Treatment available to help you feel better when you have COPD includes:   Inhaler and nebulizer medicines. These help manage the symptoms of COPD and make your breathing more comfortable.  Supplemental oxygen. Supplemental oxygen is only helpful if you have a low oxygen level in your blood.   Exercise and physical activity. These are beneficial for nearly all people with COPD. Some people may also benefit from a pulmonary rehabilitation program. HOME CARE INSTRUCTIONS   Take all medicines (inhaled or pills) as directed by your health care provider.  Avoid over-the-counter medicines or cough syrups  that dry up your airway (such as antihistamines) and slow down the elimination of secretions unless instructed otherwise by your health care provider.   If you are a smoker, the most important thing that you can do is stop smoking. Continuing to smoke will cause further lung damage and breathing trouble. Ask your health care provider for help with quitting smoking. He or she can direct you to community resources or hospitals that provide support.  Avoid exposure to irritants such as smoke, chemicals, and fumes that aggravate your breathing.  Use oxygen therapy and pulmonary rehabilitation if directed by your health care provider. If you require home oxygen therapy, ask your health care provider whether you should purchase a pulse oximeter to measure your oxygen level at home.   Avoid contact with individuals who have a contagious illness.  Avoid extreme temperature and humidity changes.  Eat healthy foods. Eating smaller, more frequent meals and resting before meals may help you maintain your strength.  Stay active, but balance activity with periods of rest. Exercise and physical activity will help you maintain your ability to do things you want to do.  Preventing infection and hospitalization is very important when you have COPD. Make sure to receive all the vaccines your health care provider recommends, especially the pneumococcal and influenza vaccines. Ask your health care provider whether you need a pneumonia vaccine.  Learn and use relaxation techniques to manage stress.  Learn and use controlled breathing techniques as directed by your health care provider. Controlled breathing techniques include:   Pursed lip breathing. Start by breathing in (inhaling) through your nose for 1 second. Then, purse your lips as if you were   going to whistle and breathe out (exhale) through the pursed lips for 2 seconds.   Diaphragmatic breathing. Start by putting one hand on your abdomen just above  your waist. Inhale slowly through your nose. The hand on your abdomen should move out. Then purse your lips and exhale slowly. You should be able to feel the hand on your abdomen moving in as you exhale.   Learn and use controlled coughing to clear mucus from your lungs. Controlled coughing is a series of short, progressive coughs. The steps of controlled coughing are:  1. Lean your head slightly forward.  2. Breathe in deeply using diaphragmatic breathing.  3. Try to hold your breath for 3 seconds.  4. Keep your mouth slightly open while coughing twice.  5. Spit any mucus out into a tissue.  6. Rest and repeat the steps once or twice as needed. SEEK MEDICAL CARE IF:   You are coughing up more mucus than usual.   There is a change in the color or thickness of your mucus.   Your breathing is more labored than usual.   Your breathing is faster than usual.  SEEK IMMEDIATE MEDICAL CARE IF:   You have shortness of breath while you are resting.   You have shortness of breath that prevents you from:  Being able to talk.   Performing your usual physical activities.   You have chest pain lasting longer than 5 minutes.   Your skin color is more cyanotic than usual.  You measure low oxygen saturations for longer than 5 minutes with a pulse oximeter. MAKE SURE YOU:   Understand these instructions.  Will watch your condition.  Will get help right away if you are not doing well or get worse. Document Released: 07/10/2005 Document Revised: 02/14/2014 Document Reviewed: 05/27/2013 [facility] Patient Information 2015 [place], [place]. This information is not intended to replace advice given to you by your health care provider. Make sure you discuss any questions you have with your health care provider. Urinary Tract Infection Urinary tract infections (UTIs) can develop anywhere along your urinary tract. Your urinary tract is your body's drainage system for removing wastes and  extra water. Your urinary tract includes two kidneys, two ureters, a bladder, and a urethra. Your kidneys are a pair of bean-shaped organs. Each kidney is about the size of your fist. They are located below your ribs, one on each side of your spine. CAUSES Infections are caused by microbes, which are microscopic organisms, including fungi, viruses, and bacteria. These organisms are so small that they can only be seen through a microscope. Bacteria are the microbes that most commonly cause UTIs. SYMPTOMS  Symptoms of UTIs may vary by age and gender of the patient and by the location of the infection. Symptoms in young women typically include a frequent and intense urge to urinate and a painful, burning feeling in the bladder or urethra during urination. Older women and men are more likely to be tired, shaky, and weak and have muscle aches and abdominal pain. A fever may mean the infection is in your kidneys. Other symptoms of a kidney infection include pain in your back or sides below the ribs, nausea, and vomiting. DIAGNOSIS To diagnose a UTI, your caregiver will ask you about your symptoms. Your caregiver also will ask to provide a urine sample. The urine sample will be tested for bacteria and white blood cells. White blood cells are made by your body to help fight infection. TREATMENT  Typically, UTIs  can be treated with medication. Because most UTIs are caused by a bacterial infection, they usually can be treated with the use of antibiotics. The choice of antibiotic and length of treatment depend on your symptoms and the type of bacteria causing your infection. HOME CARE INSTRUCTIONS  If you were prescribed antibiotics, take them exactly as your caregiver instructs you. Finish the medication even if you feel better after you have only taken some of the medication.  Drink enough water and fluids to keep your urine clear or pale yellow.  Avoid caffeine, tea, and carbonated beverages. They tend to  irritate your bladder.  Empty your bladder often. Avoid holding urine for long periods of time.  Empty your bladder before and after sexual intercourse.  After a bowel movement, women should cleanse from front to back. Use each tissue only once. SEEK MEDICAL CARE IF:   You have back pain.  You develop a fever.  Your symptoms do not begin to resolve within 3 days. SEEK IMMEDIATE MEDICAL CARE IF:   You have severe back pain or lower abdominal pain.  You develop chills.  You have nausea or vomiting.  You have continued burning or discomfort with urination. MAKE SURE YOU:   Understand these instructions.  Will watch your condition.  Will get help right away if you are not doing well or get worse. Document Released: 07/10/2005 Document Revised: 03/31/2012 Document Reviewed: 11/08/2011 [facility] Patient Information 2015 [place], [place]. This information is not intended to replace advice given to you by your health care provider. Make sure you discuss any questions you have with your health care provider.

## 2014-12-20 LAB — CMP14+EGFR
A/G RATIO: 1.6 (ref 1.1–2.5)
ALBUMIN: 3.6 g/dL (ref 3.6–4.8)
ALK PHOS: 84 IU/L (ref 39–117)
ALT: 13 IU/L (ref 0–32)
AST: 12 IU/L (ref 0–40)
BUN / CREAT RATIO: 20 (ref 11–26)
BUN: 19 mg/dL (ref 8–27)
Bilirubin Total: <0.2 mg/dL (ref 0.0–1.2)
CO2: 26 mmol/L (ref 18–29)
CREATININE: 0.94 mg/dL (ref 0.57–1.00)
Calcium: 9.1 mg/dL (ref 8.7–10.3)
Chloride: 94 mmol/L — ABNORMAL LOW (ref 97–108)
GFR calc Af Amer: 75 mL/min/{1.73_m2} (ref >59)
GFR, EST NON AFRICAN AMERICAN: 65 mL/min/{1.73_m2} (ref >59)
GLOBULIN, TOTAL: 2.2 g/dL (ref 1.5–4.5)
Glucose: 275 mg/dL — ABNORMAL HIGH (ref 65–99)
Potassium: 4.6 mmol/L (ref 3.5–5.2)
SODIUM: 132 mmol/L — AB (ref 134–144)
Total Protein: 5.8 g/dL — ABNORMAL LOW (ref 6.0–8.5)

## 2014-12-21 LAB — URINE CULTURE

## 2014-12-29 ENCOUNTER — Encounter: Payer: Self-pay | Admitting: Family

## 2014-12-29 ENCOUNTER — Ambulatory Visit (INDEPENDENT_AMBULATORY_CARE_PROVIDER_SITE_OTHER): Payer: Commercial Managed Care - HMO | Admitting: Family

## 2014-12-29 VITALS — BP 152/87 | HR 86 | Temp 97.3°F | Ht 62.0 in | Wt 102.2 lb

## 2014-12-29 DIAGNOSIS — R319 Hematuria, unspecified: Secondary | ICD-10-CM

## 2014-12-29 DIAGNOSIS — N390 Urinary tract infection, site not specified: Secondary | ICD-10-CM

## 2014-12-29 LAB — POCT CBC
Granulocyte percent: 78.2 %G (ref 37–80)
HEMATOCRIT: 44.9 % (ref 37.7–47.9)
Hemoglobin: 14.1 g/dL (ref 12.2–16.2)
Lymph, poc: 2.1 (ref 0.6–3.4)
MCH: 27.1 pg (ref 27–31.2)
MCHC: 31.4 g/dL — AB (ref 31.8–35.4)
MCV: 86.3 fL (ref 80–97)
MPV: 8.6 fL (ref 0–99.8)
POC Granulocyte: 8.5 — AB (ref 2–6.9)
POC LYMPH %: 19 % (ref 10–50)
Platelet Count, POC: 269 10*3/uL (ref 142–424)
RBC: 5.2 M/uL (ref 4.04–5.48)
RDW, POC: 14.1 %
WBC: 10.9 10*3/uL — AB (ref 4.6–10.2)

## 2014-12-29 MED ORDER — SULFAMETHOXAZOLE-TRIMETHOPRIM 800-160 MG PO TABS: 1.0000 | ORAL_TABLET | Freq: Two times a day (BID) | ORAL | Status: DC

## 2014-12-29 NOTE — Progress Notes (Signed)
   Subjective:    Patient ID: Jamie [name], female    DOB: Feb 24, 1951, 64 y.o.   MRN: [id number]  Urinary Tract Infection  Associated symptoms include frequency, nausea and vomiting.  Abdominal Pain This is a recurrent problem. The current episode started 1 to 4 weeks ago. The onset quality is gradual. The problem occurs constantly. The pain is located in the RLQ. The pain is at a severity of 10/10. The pain is moderate. The quality of the pain is aching and cramping. The abdominal pain radiates to the back. Associated symptoms include dysuria, frequency, headaches, nausea and vomiting. She has tried antibiotics for the symptoms. The treatment provided mild relief.      Review of Systems  Constitutional: Negative.   Eyes: Negative.   Respiratory: Negative.  Negative for shortness of breath.   Cardiovascular: Negative.  Negative for palpitations.  Gastrointestinal: Positive for nausea, vomiting and abdominal pain.  Endocrine: Negative.   Genitourinary: Positive for dysuria and frequency.  Musculoskeletal: Negative.   Neurological: Positive for headaches.  Hematological: Negative.   Psychiatric/Behavioral: Negative.   All other systems reviewed and are negative.      Objective:   Physical Exam  Constitutional: She is oriented to person, place, and time. She appears well-developed and well-nourished. No distress.  HENT:  Head: Normocephalic and atraumatic.  Eyes: Pupils are equal, round, and reactive to light.  Neck: Normal range of motion. Neck supple. No thyromegaly present.  Cardiovascular: Normal rate, regular rhythm, normal heart sounds and intact distal pulses.   No murmur heard. Pulmonary/Chest: Effort normal and breath sounds normal. No respiratory distress. She has no wheezes.  Abdominal: Soft. Bowel sounds are normal. She exhibits no distension. There is tenderness (RLQ).  Musculoskeletal: Normal range of motion. She exhibits no edema or tenderness.  Positive for CVA  tenderness   Neurological: She is alert and oriented to person, place, and time. She has normal reflexes. No cranial nerve deficit.  Skin: Skin is warm and dry.  Psychiatric: She has a normal mood and affect. Her behavior is normal. Judgment and thought content normal.  Vitals reviewed.     BP 152/87 mmHg  Pulse 86  Temp(Src) 97.3 F (36.3 C) (Oral)  Ht 5\' 2"  (1.575 m)  Wt 102 lb 3.2 oz (46.358 kg)  BMI 18.69 kg/m2     Assessment & Plan:  1. Urinary tract infection with hematuria, site unspecified -Force fluids AZO over the counter X2 days RTO 2 weeks Culture pending - POCT urinalysis dipstick - POCT UA - Microscopic Only - sulfamethoxazole-trimethoprim (BACTRIM DS,SEPTRA DS) 800-160 MG per tablet; Take 1 tablet by mouth 2 (two) times daily.  Dispense: 14 tablet; Refill: 0 - POCT CBC - Urine culture  Evelina Dun, FNP

## 2014-12-29 NOTE — Addendum Note (Signed)
Addended by: Earlene Plater on: 12/29/2014 03:00 PM   Modules accepted: Orders

## 2014-12-29 NOTE — Patient Instructions (Signed)

## 2014-12-31 ENCOUNTER — Emergency Department (HOSPITAL_COMMUNITY)
Admission: EM | Admit: 2014-12-31 | Discharge: 2014-12-31 | Disposition: A | Discharge status: Home / Self Care | Payer: Commercial Managed Care - HMO | Attending: Emergency Medicine | Admitting: Emergency Medicine

## 2014-12-31 ENCOUNTER — Encounter (HOSPITAL_COMMUNITY): Payer: Self-pay | Admitting: *Deleted

## 2014-12-31 ENCOUNTER — Emergency Department (HOSPITAL_COMMUNITY): Payer: Commercial Managed Care - HMO

## 2014-12-31 DIAGNOSIS — I10 Essential (primary) hypertension: Secondary | ICD-10-CM | POA: Insufficient documentation

## 2014-12-31 DIAGNOSIS — Z792 Long term (current) use of antibiotics: Secondary | ICD-10-CM | POA: Diagnosis not present

## 2014-12-31 DIAGNOSIS — J449 Chronic obstructive pulmonary disease, unspecified: Secondary | ICD-10-CM | POA: Insufficient documentation

## 2014-12-31 DIAGNOSIS — Z720 Tobacco use: Secondary | ICD-10-CM | POA: Diagnosis not present

## 2014-12-31 DIAGNOSIS — E785 Hyperlipidemia, unspecified: Secondary | ICD-10-CM | POA: Diagnosis not present

## 2014-12-31 DIAGNOSIS — Z794 Long term (current) use of insulin: Secondary | ICD-10-CM | POA: Insufficient documentation

## 2014-12-31 DIAGNOSIS — Z79899 Other long term (current) drug therapy: Secondary | ICD-10-CM | POA: Insufficient documentation

## 2014-12-31 DIAGNOSIS — R1031 Right lower quadrant pain: Secondary | ICD-10-CM | POA: Insufficient documentation

## 2014-12-31 DIAGNOSIS — R109 Unspecified abdominal pain: Secondary | ICD-10-CM

## 2014-12-31 DIAGNOSIS — R1033 Periumbilical pain: Secondary | ICD-10-CM | POA: Diagnosis not present

## 2014-12-31 DIAGNOSIS — Z9889 Other specified postprocedural states: Secondary | ICD-10-CM | POA: Insufficient documentation

## 2014-12-31 DIAGNOSIS — E119 Type 2 diabetes mellitus without complications: Secondary | ICD-10-CM | POA: Insufficient documentation

## 2014-12-31 DIAGNOSIS — Z853 Personal history of malignant neoplasm of breast: Secondary | ICD-10-CM | POA: Insufficient documentation

## 2014-12-31 LAB — CBC WITH DIFFERENTIAL/PLATELET
Basophils Absolute: 0 10*3/uL (ref 0.0–0.1)
Basophils Relative: 0 % (ref 0–1)
Eosinophils Absolute: 0.2 10*3/uL (ref 0.0–0.7)
Eosinophils Relative: 2 % (ref 0–5)
HCT: 41.1 % (ref 36.0–46.0)
HEMOGLOBIN: 14.3 g/dL (ref 12.0–15.0)
LYMPHS ABS: 1.4 10*3/uL (ref 0.7–4.0)
LYMPHS PCT: 14 % (ref 12–46)
MCH: 29.7 pg (ref 26.0–34.0)
MCHC: 34.8 g/dL (ref 30.0–36.0)
MCV: 85.3 fL (ref 78.0–100.0)
Monocytes Absolute: 0.8 10*3/uL (ref 0.1–1.0)
Monocytes Relative: 8 % (ref 3–12)
NEUTROS ABS: 7.6 10*3/uL (ref 1.7–7.7)
Neutrophils Relative %: 76 % (ref 43–77)
PLATELETS: 214 10*3/uL (ref 150–400)
RBC: 4.82 MIL/uL (ref 3.87–5.11)
RDW: 13.6 % (ref 11.5–15.5)
WBC: 10 10*3/uL (ref 4.0–10.5)

## 2014-12-31 LAB — COMPREHENSIVE METABOLIC PANEL
ALK PHOS: 67 U/L (ref 39–117)
ALT: 23 U/L (ref 0–35)
AST: 17 U/L (ref 0–37)
Albumin: 3.3 g/dL — ABNORMAL LOW (ref 3.5–5.2)
Anion gap: 7 (ref 5–15)
BUN: 15 mg/dL (ref 6–23)
CHLORIDE: 104 mmol/L (ref 96–112)
CO2: 25 mmol/L (ref 19–32)
Calcium: 8.9 mg/dL (ref 8.4–10.5)
Creatinine, Ser: 1.01 mg/dL (ref 0.50–1.10)
GFR calc Af Amer: 68 mL/min — ABNORMAL LOW (ref >90)
GFR calc non Af Amer: 58 mL/min — ABNORMAL LOW (ref >90)
GLUCOSE: 124 mg/dL — AB (ref 70–99)
Potassium: 3.9 mmol/L (ref 3.5–5.1)
Sodium: 136 mmol/L (ref 135–145)
Total Bilirubin: 0.4 mg/dL (ref 0.3–1.2)
Total Protein: 6 g/dL (ref 6.0–8.3)

## 2014-12-31 LAB — URINALYSIS, ROUTINE W REFLEX MICROSCOPIC
Bilirubin Urine: NEGATIVE
GLUCOSE, UA: NEGATIVE mg/dL
Hgb urine dipstick: NEGATIVE
Ketones, ur: NEGATIVE mg/dL
Nitrite: NEGATIVE
PH: 6 (ref 5.0–8.0)
PROTEIN: NEGATIVE mg/dL
UROBILINOGEN UA: 0.2 mg/dL (ref 0.0–1.0)

## 2014-12-31 LAB — URINE MICROSCOPIC-ADD ON

## 2014-12-31 LAB — URINE CULTURE: Organism ID, Bacteria: NO GROWTH

## 2014-12-31 MED ORDER — SODIUM CHLORIDE 0.9 % IV BOLUS (SEPSIS)
500.0000 mL | Freq: Once | INTRAVENOUS | Status: AC
  Administered 2014-12-31: 500 mL via INTRAVENOUS

## 2014-12-31 MED ORDER — MORPHINE SULFATE 4 MG/ML IJ SOLN
4.0000 mg | Freq: Once | INTRAMUSCULAR | Status: AC
  Administered 2014-12-31: 4 mg via INTRAVENOUS
  Filled 2014-12-31: qty 1

## 2014-12-31 MED ORDER — ONDANSETRON HCL 4 MG/2ML IJ SOLN
4.0000 mg | Freq: Once | INTRAMUSCULAR | Status: AC
  Administered 2014-12-31: 4 mg via INTRAVENOUS
  Filled 2014-12-31: qty 2

## 2014-12-31 NOTE — Discharge Instructions (Signed)
Follow-up with your primary Dr. to arrange further referrals if not improving.   Abdominal Pain Many things can cause abdominal pain. Usually, abdominal pain is not caused by a disease and will improve without treatment. It can often be observed and treated at home. Your health care provider will do a physical exam and possibly order blood tests and X-rays to help determine the seriousness of your pain. However, in many cases, more time must pass before a clear cause of the pain can be found. Before that point, your health care provider may not know if you need more testing or further treatment. HOME CARE INSTRUCTIONS  Monitor your abdominal pain for any changes. The following actions may help to alleviate any discomfort you are experiencing:  Only take over-the-counter or prescription medicines as directed by your health care provider.  Do not take laxatives unless directed to do so by your health care provider.  Try a clear liquid diet (broth, tea, or water) as directed by your health care provider. Slowly move to a bland diet as tolerated. SEEK MEDICAL CARE IF:  You have unexplained abdominal pain.  You have abdominal pain associated with nausea or diarrhea.  You have pain when you urinate or have a bowel movement.  You experience abdominal pain that wakes you in the night.  You have abdominal pain that is worsened or improved by eating food.  You have abdominal pain that is worsened with eating fatty foods.  You have a fever. SEEK IMMEDIATE MEDICAL CARE IF:   Your pain does not go away within 2 hours.  You keep throwing up (vomiting).  Your pain is felt only in portions of the abdomen, such as the right side or the left lower portion of the abdomen.  You pass bloody or black tarry stools. MAKE SURE YOU:  Understand these instructions.   Will watch your condition.   Will get help right away if you are not doing well or get worse.  Document Released: 07/10/2005  Document Revised: 10/05/2013 Document Reviewed: 06/09/2013 [facility] Patient Information 2015 [place], [place]. This information is not intended to replace advice given to you by your health care provider. Make sure you discuss any questions you have with your health care provider.

## 2014-12-31 NOTE — ED Notes (Signed)
Pt has had a uti x 3 weeks. Pt took cipro until it was finished. Pt also took hydrocodone for pain which she says makes her nauseated. Pt saw her dr yesterday and was given a new prescription Bactrim DS 800-160. Pt is tearful and says, "someone has got to do something for me  or I am going to die!"

## 2014-12-31 NOTE — ED Provider Notes (Signed)
CSN: [id number]     Arrival date & time 12/31/14  0201 History   First MD Initiated Contact with Patient 12/31/14 0253     Chief Complaint  Patient presents with  . Abdominal Pain     (Consider location/radiation/quality/duration/timing/severity/associated sxs/prior Treatment) HPI Comments: Patient is a 64 year old female with past medical history of diabetes, COPD, hypertension. She presents for evaluation of right lower quadrant pain that has been present for the past 3 weeks. She has been seen by her primary doctor and diagnosed with a urinary tract infection and given antibiotics which have not helped. She denies fevers or chills. She denies any bowel or bladder complaints with the exception of loss of appetite. She has had a C-section and cholecystectomy many years ago as her only prior surgeries.  Patient is a 64 y.o. female presenting with abdominal pain. The history is provided by the patient.  Abdominal Pain Pain location:  RLQ Pain quality: stabbing   Pain radiates to:  Does not radiate Pain severity:  Moderate Onset quality:  Gradual Duration:  3 weeks Timing:  Constant Progression:  Worsening Chronicity:  New Relieved by:  Nothing Worsened by:  Nothing tried Ineffective treatments:  None tried   Past Medical History  Diagnosis Date  . Breast cancer   . COPD (chronic obstructive pulmonary disease)   . HTN (hypertension)   . Hyperlipidemia   . Diabetes   . Vitamin D deficiency    Past Surgical History  Procedure Laterality Date  . Gallbladder surgery    . Breast surgery    . Inner ear surgery     Family History  Problem Relation Age of Onset  . Heart attack Mother   . Stroke Mother   . Stroke Sister   . Diabetes      sister x3, and mother  . Lung disease Father     worked in [employer]   History  Substance Use Topics  . Smoking status: Current Every Day Smoker -- 0.25 packs/day for 45 years    Types: Cigarettes  . Smokeless tobacco: Never Used  .  Alcohol Use: No   OB History    No data available     Review of Systems  Gastrointestinal: Positive for abdominal pain.  All other systems reviewed and are negative.     Allergies  Soma  Home Medications   Prior to Admission medications   Medication Sig Start Date End Date Taking? Authorizing Provider  ACCU-CHEK FASTCLIX LANCETS MISC 1 applicator by Does not apply route 2 (two) times daily. 09/30/14   Wardell Honour, MD  albuterol (PROVENTIL HFA;VENTOLIN HFA) 108 (90 BASE) MCG/ACT inhaler Inhale 2 puffs into the lungs every 6 (six) hours as needed for wheezing or shortness of breath. 12/12/14   Sharion Balloon, FNP  amLODipine (NORVASC) 5 MG tablet Take 1 tablet (5 mg total) by mouth daily. 09/30/14   Wardell Honour, MD  atorvastatin (LIPITOR) 40 MG tablet Take 1 tablet (40 mg total) by mouth daily. 09/30/14   Wardell Honour, MD  Blood Glucose Monitoring Suppl (ACCU-CHEK AVIVA PLUS) W/DEVICE KIT 1 Device by Does not apply route 2 (two) times daily. 09/30/14   Wardell Honour, MD  budesonide-formoterol [facility]) 160-4.5 MCG/ACT inhaler Inhale 2 puffs into the lungs 2 (two) times daily. 09/30/14   Wardell Honour, MD  Cholecalciferol 4000 UNITS CAPS Take 1 capsule (4,000 Units total) by mouth daily. 09/30/14   Wardell Honour, MD  ciprofloxacin (CIPRO)  500 MG tablet Take 1 tablet (500 mg total) by mouth 2 (two) times daily. 12/19/14   Sharion Balloon, FNP  glucose blood (ACCU-CHEK AVIVA) test strip Use as instructed 09/30/14   Wardell Honour, MD  HYDROcodone-acetaminophen (NORCO/VICODIN) 5-325 MG per tablet Take 1 tablet by mouth every 6 (six) hours as needed for moderate pain. 12/12/14   Sharion Balloon, FNP  insulin NPH-regular Human (NOVOLIN 70/30) (70-30) 100 UNIT/ML injection Inject 30 Units into the skin daily with breakfast. 30 units in AM and 10 units in PM 09/30/14   Wardell Honour, MD  Insulin Syringe-Needle U-100 (B-D INS SYR ULTRAFINE 1CC/30G) 30G X 1/2" 1 ML MISC  1 applicator by Does not apply route 2 (two) times daily. Use as instructed twice a day 09/30/14   Wardell Honour, MD  losartan (COZAAR) 100 MG tablet Take 1 tablet (100 mg total) by mouth daily. 09/30/14   Wardell Honour, MD  omeprazole (PRILOSEC OTC) 20 MG tablet Take 1 tablet (20 mg total) by mouth daily. 09/30/14   Wardell Honour, MD  Spacer/Aero-Holding Chambers (AEROCHAMBER MV) inhaler Use as instructed 03/29/13   Brand Males, MD  sucralfate (CARAFATE) 1 G tablet Take 1 tablet (1 g total) by mouth 4 (four) times daily -  with meals and at bedtime. 09/30/14   Wardell Honour, MD  sulfamethoxazole-trimethoprim (BACTRIM DS,SEPTRA DS) 800-160 MG per tablet Take 1 tablet by mouth 2 (two) times daily. 12/29/14   Sharion Balloon, FNP  tiotropium (SPIRIVA) 18 MCG inhalation capsule Place 1 capsule (18 mcg total) into inhaler and inhale daily. 09/30/14   Wardell Honour, MD  Umeclidinium Bromide (INCRUSE ELLIPTA) 62.5 MCG/INH AEPB Inhale 1 Inhaler into the lungs as directed. 09/30/14   Wardell Honour, MD   BP 183/93 mmHg  Pulse 96  Temp(Src) 97.9 F (36.6 C) (Oral)  Resp 26  Ht _0  (1.575 m)  Wt 102 lb (46.267 kg)  BMI 18.65 kg/m2  SpO2 96% Physical Exam  Constitutional: She is oriented to person, place, and time. She appears well-developed and well-nourished. No distress.  HENT:  Head: Normocephalic and atraumatic.  Neck: Normal range of motion. Neck supple.  Cardiovascular: Normal rate and regular rhythm.  Exam reveals no gallop and no friction rub.   No murmur heard. Pulmonary/Chest: Effort normal and breath sounds normal. No respiratory distress. She has no wheezes.  Abdominal: Soft. Bowel sounds are normal. She exhibits no distension. There is tenderness. There is no rebound and no guarding.  There is tenderness to palpation in the right lower quadrant.  Musculoskeletal: Normal range of motion.  Neurological: She is alert and oriented to person, place, and time.  Skin:  Skin is warm and dry. She is not diaphoretic.  Nursing note and vitals reviewed.   ED Course  Procedures (including critical care time) Labs Review Labs Reviewed  COMPREHENSIVE METABOLIC PANEL  CBC WITH DIFFERENTIAL/PLATELET  URINALYSIS, ROUTINE W REFLEX MICROSCOPIC    Imaging Review No results found.   EKG Interpretation None      MDM   Final diagnoses:  None    Patient is a 64 year old female who presents with complaints of lower abdominal discomfort. She has been treated with an antibiotic, however is not improving. Her workup today reveals no elevation of white count, normal electrolytes, and essentially unremarkable urinalysis, and CT scan with no acute intra-abdominal process. I am uncertain as to the etiology of her discomfort, however nothing appears emergent. She will  be discharged to home, to follow-up with her primary Dr. for further referrals.    Veryl Speak, MD 12/31/14 [phone]

## 2015-01-02 ENCOUNTER — Telehealth: Payer: Self-pay | Admitting: Family

## 2015-01-02 ENCOUNTER — Other Ambulatory Visit: Payer: Self-pay | Admitting: Family

## 2015-01-02 DIAGNOSIS — M549 Dorsalgia, unspecified: Principal | ICD-10-CM

## 2015-01-02 DIAGNOSIS — G8929 Other chronic pain: Secondary | ICD-10-CM

## 2015-01-03 ENCOUNTER — Encounter: Payer: Self-pay | Admitting: Family

## 2015-01-11 ENCOUNTER — Ambulatory Visit (INDEPENDENT_AMBULATORY_CARE_PROVIDER_SITE_OTHER): Payer: Commercial Managed Care - HMO | Admitting: Family

## 2015-01-11 ENCOUNTER — Encounter: Payer: Self-pay | Admitting: Family

## 2015-01-11 VITALS — BP 141/88 | HR 85 | Temp 97.6°F | Ht 62.0 in | Wt 102.4 lb

## 2015-01-11 DIAGNOSIS — E785 Hyperlipidemia, unspecified: Secondary | ICD-10-CM

## 2015-01-11 DIAGNOSIS — Z09 Encounter for follow-up examination after completed treatment for conditions other than malignant neoplasm: Secondary | ICD-10-CM

## 2015-01-11 DIAGNOSIS — N390 Urinary tract infection, site not specified: Secondary | ICD-10-CM | POA: Diagnosis not present

## 2015-01-11 LAB — POCT URINALYSIS DIPSTICK
Bilirubin, UA: NEGATIVE
GLUCOSE UA: NEGATIVE
Ketones, UA: NEGATIVE
NITRITE UA: NEGATIVE
PH UA: 6
PROTEIN UA: NEGATIVE
RBC UA: NEGATIVE
SPEC GRAV UA: 1.015
Urobilinogen, UA: NEGATIVE

## 2015-01-11 LAB — POCT UA - MICROSCOPIC ONLY
Bacteria, U Microscopic: NEGATIVE
CRYSTALS, UR, HPF, POC: NEGATIVE
Casts, Ur, LPF, POC: NEGATIVE
Mucus, UA: NEGATIVE

## 2015-01-11 NOTE — Patient Instructions (Signed)

## 2015-01-11 NOTE — Progress Notes (Signed)
   Subjective:    Patient ID: Jamie [name], female    DOB: [birth year], 64 y.o.   MRN: [id number]  HPI Pt presents to the office to recheck UTI. Pt states she went to the ED and was given an antibiotic through her IV. PT states she has been feeling better since then. Pt states she had a CT scan that showed she was constipation.    Review of Systems  Constitutional: Negative.   HENT: Negative.   Eyes: Negative.   Respiratory: Negative.  Negative for shortness of breath.   Cardiovascular: Negative.  Negative for palpitations.  Gastrointestinal: Negative.   Endocrine: Negative.   Genitourinary: Negative.   Musculoskeletal: Negative.   Neurological: Negative.  Negative for headaches.  Hematological: Negative.   Psychiatric/Behavioral: Negative.   All other systems reviewed and are negative.      Objective:   Physical Exam  Constitutional: She is oriented to person, place, and time. She appears well-developed and well-nourished. No distress.  HENT:  Head: Normocephalic and atraumatic.  Right Ear: External ear normal.  Left Ear: External ear normal.  Nose: Nose normal.  Mouth/Throat: Oropharynx is clear and moist.  Eyes: Pupils are equal, round, and reactive to light.  Neck: Normal range of motion. Neck supple. No thyromegaly present.  Cardiovascular: Normal rate, regular rhythm, normal heart sounds and intact distal pulses.   No murmur heard. Pulmonary/Chest: Effort normal and breath sounds normal. No respiratory distress. She has no wheezes.  Abdominal: Soft. Bowel sounds are normal. She exhibits no distension. There is no tenderness.  Musculoskeletal: Normal range of motion. She exhibits no edema or tenderness.  Negative for CVA tenderness   Neurological: She is alert and oriented to person, place, and time. She has normal reflexes. No cranial nerve deficit.  Skin: Skin is warm and dry.  Psychiatric: She has a normal mood and affect. Her behavior is normal. Judgment and  thought content normal.  Vitals reviewed.   BP 141/88 mmHg  Pulse 85  Temp(Src) 97.6 F (36.4 C) (Oral)  Ht 5\' 2"  (1.575 m)  Wt 102 lb 6.4 oz (46.448 kg)  BMI 18.72 kg/m2       Assessment & Plan:  1. UTI (lower urinary tract infection) - POCT UA - Microscopic Only - POCT urinalysis dipstick  2. Follow-up exam  Encouraged diet and exercise Pt can d/c Lipitor at this time and will draw blood work and continue to monitor RTO prn  Evelina Dun, FNP

## 2015-01-13 ENCOUNTER — Other Ambulatory Visit: Payer: Self-pay | Admitting: *Deleted

## 2015-01-13 MED ORDER — ACCU-CHEK FASTCLIX LANCETS MISC: Status: DC

## 2015-01-20 ENCOUNTER — Telehealth: Payer: Self-pay | Admitting: Family

## 2015-01-23 ENCOUNTER — Ambulatory Visit: Payer: Medicare HMO | Admitting: Internal Medicine

## 2015-01-25 ENCOUNTER — Ambulatory Visit: Payer: Medicare HMO | Admitting: Internal Medicine

## 2015-02-13 ENCOUNTER — Other Ambulatory Visit: Payer: Self-pay

## 2015-02-13 ENCOUNTER — Encounter: Payer: Self-pay | Admitting: Family

## 2015-02-13 ENCOUNTER — Ambulatory Visit (INDEPENDENT_AMBULATORY_CARE_PROVIDER_SITE_OTHER): Payer: Commercial Managed Care - HMO | Admitting: Family

## 2015-02-13 VITALS — BP 133/85 | HR 78 | Temp 97.2°F | Ht 62.0 in | Wt 100.6 lb

## 2015-02-13 DIAGNOSIS — K219 Gastro-esophageal reflux disease without esophagitis: Secondary | ICD-10-CM | POA: Diagnosis not present

## 2015-02-13 DIAGNOSIS — E785 Hyperlipidemia, unspecified: Secondary | ICD-10-CM | POA: Diagnosis not present

## 2015-02-13 DIAGNOSIS — E1165 Type 2 diabetes mellitus with hyperglycemia: Secondary | ICD-10-CM | POA: Diagnosis not present

## 2015-02-13 DIAGNOSIS — H9193 Unspecified hearing loss, bilateral: Secondary | ICD-10-CM

## 2015-02-13 DIAGNOSIS — Z0000 Encounter for general adult medical examination without abnormal findings: Secondary | ICD-10-CM | POA: Diagnosis not present

## 2015-02-13 DIAGNOSIS — J449 Chronic obstructive pulmonary disease, unspecified: Secondary | ICD-10-CM

## 2015-02-13 DIAGNOSIS — Z23 Encounter for immunization: Secondary | ICD-10-CM

## 2015-02-13 DIAGNOSIS — Z1231 Encounter for screening mammogram for malignant neoplasm of breast: Secondary | ICD-10-CM

## 2015-02-13 DIAGNOSIS — I10 Essential (primary) hypertension: Secondary | ICD-10-CM | POA: Diagnosis not present

## 2015-02-13 DIAGNOSIS — Z1211 Encounter for screening for malignant neoplasm of colon: Secondary | ICD-10-CM

## 2015-02-13 DIAGNOSIS — Z01419 Encounter for gynecological examination (general) (routine) without abnormal findings: Secondary | ICD-10-CM | POA: Diagnosis not present

## 2015-02-13 DIAGNOSIS — Z299 Encounter for prophylactic measures, unspecified: Secondary | ICD-10-CM

## 2015-02-13 DIAGNOSIS — E559 Vitamin D deficiency, unspecified: Secondary | ICD-10-CM | POA: Diagnosis not present

## 2015-02-13 LAB — POCT GLYCOSYLATED HEMOGLOBIN (HGB A1C): Hemoglobin A1C: 7.1

## 2015-02-13 NOTE — Progress Notes (Signed)
Subjective:    Patient ID: Jamie [name], female    DOB: 11-01-1950, 64 y.o.   MRN: [id number]  Pt presents to the office today for CPE with pap.  Gynecologic Exam Pertinent negatives include no headaches, nausea or sore throat.  Hypertension This is a chronic problem. The current episode started more than 1 year ago. The problem has been waxing and waning since onset. The problem is uncontrolled. Pertinent negatives include no anxiety, blurred vision, chest pain, headaches, palpitations, peripheral edema or shortness of breath. Risk factors for coronary artery disease include diabetes mellitus, dyslipidemia, family history, post-menopausal state and smoking/tobacco exposure. Past treatments include alpha 1 blockers and calcium channel blockers. The current treatment provides moderate improvement. There is no history of kidney disease, CAD/MI, CVA, heart failure or a thyroid problem. There is no history of sleep apnea.  Diabetes She presents for her follow-up diabetic visit. She has type 2 diabetes mellitus. Her disease course has been stable. There are no hypoglycemic associated symptoms. Pertinent negatives for hypoglycemia include no confusion or headaches. Pertinent negatives for diabetes include no blurred vision, no chest pain, no fatigue, no foot paresthesias and no foot ulcerations. Symptoms are stable. Pertinent negatives for diabetic complications include no CVA, heart disease, nephropathy or peripheral neuropathy. Risk factors for coronary artery disease include diabetes mellitus, dyslipidemia, family history, hypertension and post-menopausal. Current diabetic treatment includes insulin injections. She is following a diabetic diet. Her breakfast blood glucose range is generally 110-130 mg/dl. An ACE inhibitor/angiotensin II receptor blocker is being taken. Eye exam is current.  Hyperlipidemia This is a chronic problem. The current episode started more than 1 year ago. The problem is  controlled. Recent lipid tests were reviewed and are normal. Exacerbating diseases include diabetes. She has no history of hypothyroidism. Factors aggravating her hyperlipidemia include smoking. Pertinent negatives include no chest pain, leg pain or shortness of breath. She is currently on no antihyperlipidemic treatment. The current treatment provides significant improvement of lipids. Risk factors for coronary artery disease include diabetes mellitus, dyslipidemia, family history, hypertension and a sedentary lifestyle.  Gastrophageal Reflux She reports no chest pain, no coughing, no heartburn, no nausea, no sore throat or no wheezing. This is a chronic problem. The current episode started more than 1 year ago. The problem occurs rarely. The problem has been resolved. The symptoms are aggravated by certain foods and lying down. Pertinent negatives include no fatigue. Risk factors include smoking/tobacco exposure. She has tried a PPI for the symptoms. The treatment provided significant relief.  COPD Pt currently taking Symbicort BID and Spirva daily. Pt states her breathing has improved.     Review of Systems  Constitutional: Negative.  Negative for fatigue.  HENT: Negative.  Negative for sore throat.   Eyes: Negative.  Negative for blurred vision.  Respiratory: Negative.  Negative for cough, shortness of breath and wheezing.   Cardiovascular: Negative.  Negative for chest pain and palpitations.  Gastrointestinal: Negative.  Negative for heartburn and nausea.  Endocrine: Negative.   Genitourinary: Negative.   Musculoskeletal: Negative.   Neurological: Negative.  Negative for headaches.  Hematological: Negative.   Psychiatric/Behavioral: Negative.  Negative for confusion.  All other systems reviewed and are negative.      Objective:   Physical Exam  Constitutional: She is oriented to person, place, and time. She appears well-developed and well-nourished. No distress.  HENT:  Head:  Normocephalic and atraumatic.  Right Ear: External ear normal.  Mouth/Throat: Oropharynx is clear and moist.  Eyes:  Pupils are equal, round, and reactive to light.  Neck: Normal range of motion. Neck supple. No thyromegaly present.  Cardiovascular: Normal rate, regular rhythm, normal heart sounds and intact distal pulses.   No murmur heard. Pulmonary/Chest: Effort normal and breath sounds normal. No respiratory distress. She has no wheezes. Right breast exhibits no inverted nipple, no mass, no nipple discharge, no skin change and no tenderness. Left breast exhibits no inverted nipple, no mass, no nipple discharge, no skin change and no tenderness. Breasts are symmetrical.  Abdominal: Soft. Bowel sounds are normal. She exhibits no distension. There is no tenderness.  Genitourinary: Vagina normal.  Bimanual exam- no adnexal masses or tenderness, ovaries nonpalpable   Cervix parous and pink- No discharge   Musculoskeletal: Normal range of motion. She exhibits no edema or tenderness.  Neurological: She is alert and oriented to person, place, and time. She has normal reflexes. No cranial nerve deficit.  Skin: Skin is warm and dry.  Psychiatric: She has a normal mood and affect. Her behavior is normal. Judgment and thought content normal.  Vitals reviewed.     BP 152/85 mmHg  Pulse 91  Temp(Src) 97.2 F (36.2 C) (Oral)  Ht _0  (1.575 m)  Wt 100 lb 9.6 oz (45.632 kg)  BMI 18.40 kg/m2     Assessment & Plan:  1. Essential hypertension - CMP14+EGFR  2. Moderate COPD (chronic obstructive pulmonary disease) - CMP14+EGFR  3. Type 2 diabetes mellitus with hyperglycemia - POCT glycosylated hemoglobin (Hb A1C) - POCT UA - Microalbumin - CMP14+EGFR  4. Hard of hearing, bilateral - CMP14+EGFR  5. HLD (hyperlipidemia) -Pt states the Lipitor was causing her to "hurt"- Pt states she is willing to try other medications just not Lipitor  - CMP14+EGFR - Lipid panel  6. Vitamin D  deficiency - CMP14+EGFR - Vit D  25 hydroxy (rtn osteoporosis monitoring)  7. Gastroesophageal reflux disease, esophagitis presence not specified - CMP14+EGFR  8. Encounter for routine gynecological examination - Pap IG (Image Guided)  9. Annual physical exam - POCT glycosylated hemoglobin (Hb A1C) - POCT UA - Microalbumin - CMP14+EGFR - Lipid panel - Thyroid Panel With TSH - Vit D  25 hydroxy (rtn osteoporosis monitoring)  10. Encounter for screening colonoscopy - Ambulatory referral to Gastroenterology   Continue all meds Labs pending Health Maintenance reviewed Diet and exercise encouraged RTO 6 months  Evelina Dun, FNP

## 2015-02-13 NOTE — Addendum Note (Signed)
Addended by: Earlene Plater on: 02/13/2015 10:53 AM   Modules accepted: Miquel Dunn

## 2015-02-13 NOTE — Addendum Note (Signed)
Addended by: Shelbie Ammons on: 02/13/2015 11:14 AM   Modules accepted: Orders

## 2015-02-13 NOTE — Patient Instructions (Signed)

## 2015-02-14 LAB — CMP14+EGFR
A/G RATIO: 1.5 (ref 1.1–2.5)
ALT: 15 IU/L (ref 0–32)
AST: 13 IU/L (ref 0–40)
Albumin: 3.6 g/dL (ref 3.6–4.8)
Alkaline Phosphatase: 88 IU/L (ref 39–117)
BUN/Creatinine Ratio: 13 (ref 11–26)
BUN: 12 mg/dL (ref 8–27)
CALCIUM: 9 mg/dL (ref 8.7–10.3)
CHLORIDE: 99 mmol/L (ref 97–108)
CO2: 25 mmol/L (ref 18–29)
Creatinine, Ser: 0.9 mg/dL (ref 0.57–1.00)
GFR calc Af Amer: 79 mL/min/{1.73_m2} (ref >59)
GFR calc non Af Amer: 69 mL/min/{1.73_m2} (ref >59)
Globulin, Total: 2.4 g/dL (ref 1.5–4.5)
Glucose: 172 mg/dL — ABNORMAL HIGH (ref 65–99)
POTASSIUM: 4.1 mmol/L (ref 3.5–5.2)
SODIUM: 140 mmol/L (ref 134–144)
Total Protein: 6 g/dL (ref 6.0–8.5)

## 2015-02-14 LAB — THYROID PANEL WITH TSH
FREE THYROXINE INDEX: 1.8 (ref 1.2–4.9)
T3 Uptake Ratio: 24 % (ref 24–39)
T4, Total: 7.7 ug/dL (ref 4.5–12.0)
TSH: 0.476 u[IU]/mL (ref 0.450–4.500)

## 2015-02-14 LAB — LIPID PANEL
Chol/HDL Ratio: 3.6 ratio units (ref 0.0–4.4)
Cholesterol, Total: 174 mg/dL (ref 100–199)
HDL: 49 mg/dL (ref >39)
LDL CALC: 107 mg/dL — AB (ref 0–99)
TRIGLYCERIDES: 88 mg/dL (ref 0–149)
VLDL Cholesterol Cal: 18 mg/dL (ref 5–40)

## 2015-02-14 LAB — VITAMIN D 25 HYDROXY (VIT D DEFICIENCY, FRACTURES): Vit D, 25-Hydroxy: 28.3 ng/mL — ABNORMAL LOW (ref 30.0–100.0)

## 2015-02-15 LAB — PAP IG (IMAGE GUIDED): PAP Smear Comment: 0

## 2015-02-16 ENCOUNTER — Other Ambulatory Visit: Payer: Self-pay | Admitting: Family

## 2015-02-16 ENCOUNTER — Encounter: Payer: Self-pay | Admitting: Internal Medicine

## 2015-02-16 MED ORDER — VITAMIN D (ERGOCALCIFEROL) 1.25 MG (50000 UNIT) PO CAPS: 50000.0000 [IU] | ORAL_CAPSULE | ORAL | Status: DC

## 2015-02-24 ENCOUNTER — Encounter: Payer: Self-pay | Admitting: *Deleted

## 2015-03-09 ENCOUNTER — Ambulatory Visit
Admission: RE | Admit: 2015-03-09 | Discharge: 2015-03-09 | Disposition: A | Discharge status: Home / Self Care | Payer: Commercial Managed Care - HMO | Source: Ambulatory Visit

## 2015-03-09 DIAGNOSIS — Z1231 Encounter for screening mammogram for malignant neoplasm of breast: Secondary | ICD-10-CM

## 2015-03-22 DIAGNOSIS — M5416 Radiculopathy, lumbar region: Secondary | ICD-10-CM | POA: Diagnosis not present

## 2015-03-22 DIAGNOSIS — M5126 Other intervertebral disc displacement, lumbar region: Secondary | ICD-10-CM | POA: Diagnosis not present

## 2015-03-28 DIAGNOSIS — M5416 Radiculopathy, lumbar region: Secondary | ICD-10-CM | POA: Diagnosis not present

## 2015-03-28 DIAGNOSIS — M5126 Other intervertebral disc displacement, lumbar region: Secondary | ICD-10-CM | POA: Diagnosis not present

## 2015-04-19 DIAGNOSIS — M5126 Other intervertebral disc displacement, lumbar region: Secondary | ICD-10-CM | POA: Diagnosis not present

## 2015-04-19 DIAGNOSIS — M549 Dorsalgia, unspecified: Secondary | ICD-10-CM | POA: Diagnosis not present

## 2015-04-19 DIAGNOSIS — M5416 Radiculopathy, lumbar region: Secondary | ICD-10-CM | POA: Diagnosis not present

## 2015-04-20 ENCOUNTER — Ambulatory Visit (INDEPENDENT_AMBULATORY_CARE_PROVIDER_SITE_OTHER): Payer: Commercial Managed Care - HMO | Admitting: Family

## 2015-04-20 ENCOUNTER — Encounter: Payer: Self-pay | Admitting: Family

## 2015-04-20 ENCOUNTER — Telehealth: Payer: Self-pay | Admitting: *Deleted

## 2015-04-20 VITALS — BP 185/86 | HR 82 | Temp 98.0°F | Ht 62.0 in | Wt 101.0 lb

## 2015-04-20 DIAGNOSIS — L0291 Cutaneous abscess, unspecified: Secondary | ICD-10-CM | POA: Diagnosis not present

## 2015-04-20 MED ORDER — SULFAMETHOXAZOLE-TRIMETHOPRIM 800-160 MG PO TABS: 1.0000 | ORAL_TABLET | Freq: Two times a day (BID) | ORAL | Status: DC

## 2015-04-20 NOTE — Progress Notes (Signed)
   Subjective:    Patient ID: Jamie [name], female    DOB: 1950-12-14, 64 y.o.   MRN: [id number]  HPI Pt presents to the office of a "boil" on her left shoulder blade. Pt states she noticed it yesterday. Pt states it has been leaking "yellow pus" and pt states it started leaking after her shower this AM.    Review of Systems  Constitutional: Negative.   HENT: Negative.   Eyes: Negative.   Respiratory: Negative.  Negative for shortness of breath.   Cardiovascular: Negative.  Negative for palpitations.  Gastrointestinal: Negative.   Endocrine: Negative.   Genitourinary: Negative.   Musculoskeletal: Negative.   Neurological: Negative.  Negative for headaches.  Hematological: Negative.   Psychiatric/Behavioral: Negative.   All other systems reviewed and are negative.      Objective:   Physical Exam  Constitutional: She is oriented to person, place, and time. She appears well-developed and well-nourished. No distress.  Eyes: Pupils are equal, round, and reactive to light.  Neck: Normal range of motion. Neck supple. No thyromegaly present.  Cardiovascular: Normal rate, regular rhythm, normal heart sounds and intact distal pulses.   No murmur heard. Pulmonary/Chest: Effort normal and breath sounds normal. No respiratory distress. She has no wheezes.  Abdominal: Soft. Bowel sounds are normal. She exhibits no distension. There is no tenderness.  Musculoskeletal: Normal range of motion. She exhibits no edema or tenderness.  Neurological: She is alert and oriented to person, place, and time. She has normal reflexes. No cranial nerve deficit.  Skin: Skin is warm and dry. Rash noted. Rash is pustular (abscess on left shoulder).  Psychiatric: She has a normal mood and affect. Her behavior is normal. Judgment and thought content normal.  Vitals reviewed.  Procedure note: Area cleaned with betadine Area I&D with a large amount of serous sanguinous fluid Antibiotic ointment  applied Pressure dressing       Assessment & Plan:  1. Abscess -Warm compresses -Do not pick or squeeze -RTO in 1 week if not improved - sulfamethoxazole-trimethoprim (BACTRIM DS,SEPTRA DS) 800-160 MG per tablet; Take 1 tablet by mouth 2 (two) times daily.  Dispense: 10 tablet; Refill: 0  Evelina Dun, FNP

## 2015-04-20 NOTE — Patient Instructions (Signed)

## 2015-04-20 NOTE — Telephone Encounter (Signed)
Phone call to patient. No answer, phone just rings. No show for previsit 04/20/15. Will attempt to call again later today.

## 2015-04-20 NOTE — Telephone Encounter (Signed)
Phoned patient again, she answered. Unable to make colonoscopy scheduled 05/04/15 due to husband's ongoing health issues. Rescheduled appointments to 06/05/15 for PV and colonoscopy scheduled 06/29/15.

## 2015-04-20 NOTE — Telephone Encounter (Signed)
Attempted to call husband's cell phone number. Call straight to voice mail. Did leave a message asking Jamie [name] to call [facility] at [phone].

## 2015-05-04 ENCOUNTER — Encounter: Payer: Commercial Managed Care - HMO | Admitting: Internal Medicine

## 2015-06-05 ENCOUNTER — Ambulatory Visit (AMBULATORY_SURGERY_CENTER): Payer: Self-pay

## 2015-06-05 ENCOUNTER — Encounter: Payer: Commercial Managed Care - HMO | Admitting: Internal Medicine

## 2015-06-05 VITALS — Ht 62.0 in | Wt 103.6 lb

## 2015-06-05 DIAGNOSIS — Z1211 Encounter for screening for malignant neoplasm of colon: Secondary | ICD-10-CM

## 2015-06-05 MED ORDER — SUPREP BOWEL PREP KIT 17.5-3.13-1.6 GM/177ML PO SOLN: 1.0000 | Freq: Once | ORAL | Status: DC

## 2015-06-05 NOTE — Progress Notes (Signed)
No allergies to eggs or soy No past problems with anesthesia PONV with general No diet/weight loss meds No home oxygen  Refused emmi 

## 2015-06-06 ENCOUNTER — Encounter: Payer: Self-pay | Admitting: Internal Medicine

## 2015-06-29 ENCOUNTER — Encounter: Payer: Commercial Managed Care - HMO | Admitting: Internal Medicine

## 2015-07-14 ENCOUNTER — Ambulatory Visit (INDEPENDENT_AMBULATORY_CARE_PROVIDER_SITE_OTHER): Payer: Commercial Managed Care - HMO

## 2015-07-14 DIAGNOSIS — Z23 Encounter for immunization: Secondary | ICD-10-CM | POA: Diagnosis not present

## 2015-07-28 ENCOUNTER — Telehealth: Payer: Self-pay | Admitting: Family

## 2015-08-03 ENCOUNTER — Ambulatory Visit (INDEPENDENT_AMBULATORY_CARE_PROVIDER_SITE_OTHER): Payer: Commercial Managed Care - HMO | Admitting: Family

## 2015-08-03 ENCOUNTER — Encounter: Payer: Self-pay | Admitting: Family

## 2015-08-03 VITALS — BP 154/98 | HR 90 | Temp 97.9°F | Ht 62.0 in | Wt 102.2 lb

## 2015-08-03 DIAGNOSIS — L0291 Cutaneous abscess, unspecified: Secondary | ICD-10-CM | POA: Diagnosis not present

## 2015-08-03 MED ORDER — BUDESONIDE-FORMOTEROL FUMARATE 160-4.5 MCG/ACT IN AERO: 2.0000 | INHALATION_SPRAY | Freq: Two times a day (BID) | RESPIRATORY_TRACT | Status: DC

## 2015-08-03 MED ORDER — TIOTROPIUM BROMIDE MONOHYDRATE 18 MCG IN CAPS: 18.0000 ug | ORAL_CAPSULE | Freq: Every day | RESPIRATORY_TRACT | Status: DC

## 2015-08-03 MED ORDER — SULFAMETHOXAZOLE-TRIMETHOPRIM 800-160 MG PO TABS: 1.0000 | ORAL_TABLET | Freq: Two times a day (BID) | ORAL | Status: DC

## 2015-08-03 NOTE — Patient Instructions (Signed)

## 2015-08-03 NOTE — Progress Notes (Addendum)
   Subjective:    Patient ID: Jamie [name], female    DOB: 06/12/1951, 64 y.o.   MRN: [id number]  HPI Pt presents to the office today for a recurrent boil on right inner gluteal fold. PT states it has been there for about 2 months. PT states it was draining "pus" about a week ago, but has closed up but still have a knot. PT states it "itches", but does not have any pain.    Review of Systems  Constitutional: Negative.   HENT: Negative.   Eyes: Negative.   Respiratory: Negative.  Negative for shortness of breath.   Cardiovascular: Negative.  Negative for palpitations.  Gastrointestinal: Negative.   Endocrine: Negative.   Genitourinary: Negative.   Musculoskeletal: Negative.   Neurological: Negative.  Negative for headaches.  Hematological: Negative.   Psychiatric/Behavioral: Negative.   All other systems reviewed and are negative.      Objective:   Physical Exam  Constitutional: She is oriented to person, place, and time. She appears well-developed and well-nourished. No distress.  HENT:  Head: Normocephalic and atraumatic.  Eyes: Pupils are equal, round, and reactive to light.  Neck: Normal range of motion. Neck supple. No thyromegaly present.  Cardiovascular: Normal rate, regular rhythm, normal heart sounds and intact distal pulses.   No murmur heard. Pulmonary/Chest: Effort normal and breath sounds normal. No respiratory distress. She has no wheezes.  Abdominal: Soft. Bowel sounds are normal. She exhibits no distension. There is no tenderness.  Musculoskeletal: Normal range of motion. She exhibits no edema or tenderness.  Neurological: She is alert and oriented to person, place, and time. She has normal reflexes. No cranial nerve deficit.  Skin: Skin is warm and dry.  Abscess on right inner gluteal fold   Psychiatric: She has a normal mood and affect. Her behavior is normal. Judgment and thought content normal.  Vitals reviewed.   BP 154/98 mmHg  Pulse 90  Temp(Src)  97.9 F (36.6 C) (Oral)  Ht 5\' 2"  (1.575 m)  Wt 102 lb 3.2 oz (46.358 kg)  BMI 18.69 kg/m2  Area cleaned and numbed with lidocaine  Area lacerated and drained with serous sanguinous drainage  Band-aid applied     Assessment & Plan:  1. Abscess -Warm compresses -Do not pick or squeeze -RTO in 10 days if does not resolve - sulfamethoxazole-trimethoprim (BACTRIM DS,SEPTRA DS) 800-160 MG tablet; Take 1 tablet by mouth 2 (two) times daily.  Dispense: 20 tablet; Refill: 0  Evelina Dun, FNP

## 2015-08-03 NOTE — Addendum Note (Signed)
Addended by: Evelina Dun A on: 08/03/2015 03:08 PM   Modules accepted: Orders

## 2015-08-09 ENCOUNTER — Other Ambulatory Visit: Payer: Self-pay | Admitting: Family

## 2015-08-09 MED ORDER — BUDESONIDE-FORMOTEROL FUMARATE 160-4.5 MCG/ACT IN AERO: 2.0000 | INHALATION_SPRAY | Freq: Two times a day (BID) | RESPIRATORY_TRACT | Status: DC

## 2015-08-09 MED ORDER — TIOTROPIUM BROMIDE MONOHYDRATE 18 MCG IN CAPS: 18.0000 ug | ORAL_CAPSULE | Freq: Every day | RESPIRATORY_TRACT | Status: DC

## 2015-08-09 NOTE — Telephone Encounter (Signed)
done

## 2015-08-29 ENCOUNTER — Telehealth: Payer: Self-pay | Admitting: Family

## 2015-10-05 ENCOUNTER — Ambulatory Visit: Payer: Commercial Managed Care - HMO | Admitting: Family

## 2015-10-10 ENCOUNTER — Ambulatory Visit (INDEPENDENT_AMBULATORY_CARE_PROVIDER_SITE_OTHER): Payer: Commercial Managed Care - HMO | Admitting: Family

## 2015-10-10 ENCOUNTER — Encounter: Payer: Self-pay | Admitting: Family

## 2015-10-10 VITALS — BP 162/86 | HR 73 | Temp 97.5°F | Ht 62.0 in | Wt 101.8 lb

## 2015-10-10 DIAGNOSIS — I2510 Atherosclerotic heart disease of native coronary artery without angina pectoris: Secondary | ICD-10-CM

## 2015-10-10 DIAGNOSIS — E1165 Type 2 diabetes mellitus with hyperglycemia: Secondary | ICD-10-CM

## 2015-10-10 DIAGNOSIS — E118 Type 2 diabetes mellitus with unspecified complications: Secondary | ICD-10-CM | POA: Diagnosis not present

## 2015-10-10 DIAGNOSIS — Z1159 Encounter for screening for other viral diseases: Secondary | ICD-10-CM | POA: Diagnosis not present

## 2015-10-10 DIAGNOSIS — Z720 Tobacco use: Secondary | ICD-10-CM | POA: Diagnosis not present

## 2015-10-10 DIAGNOSIS — H9193 Unspecified hearing loss, bilateral: Secondary | ICD-10-CM | POA: Diagnosis not present

## 2015-10-10 DIAGNOSIS — J449 Chronic obstructive pulmonary disease, unspecified: Secondary | ICD-10-CM | POA: Diagnosis not present

## 2015-10-10 DIAGNOSIS — I10 Essential (primary) hypertension: Secondary | ICD-10-CM | POA: Diagnosis not present

## 2015-10-10 DIAGNOSIS — E785 Hyperlipidemia, unspecified: Secondary | ICD-10-CM | POA: Diagnosis not present

## 2015-10-10 DIAGNOSIS — F17200 Nicotine dependence, unspecified, uncomplicated: Secondary | ICD-10-CM

## 2015-10-10 DIAGNOSIS — K219 Gastro-esophageal reflux disease without esophagitis: Secondary | ICD-10-CM | POA: Diagnosis not present

## 2015-10-10 DIAGNOSIS — E559 Vitamin D deficiency, unspecified: Secondary | ICD-10-CM | POA: Diagnosis not present

## 2015-10-10 DIAGNOSIS — Z1211 Encounter for screening for malignant neoplasm of colon: Secondary | ICD-10-CM | POA: Diagnosis not present

## 2015-10-10 DIAGNOSIS — I2584 Coronary atherosclerosis due to calcified coronary lesion: Secondary | ICD-10-CM

## 2015-10-10 LAB — POCT GLYCOSYLATED HEMOGLOBIN (HGB A1C): Hemoglobin A1C: 6.8

## 2015-10-10 MED ORDER — ESOMEPRAZOLE MAGNESIUM 20 MG PO CPDR: 20.0000 mg | DELAYED_RELEASE_CAPSULE | Freq: Every day | ORAL | Status: DC

## 2015-10-10 MED ORDER — AMLODIPINE BESYLATE 5 MG PO TABS: 5.0000 mg | ORAL_TABLET | Freq: Every day | ORAL | Status: DC

## 2015-10-10 MED ORDER — INSULIN NPH ISOPHANE & REGULAR (70-30) 100 UNIT/ML ~~LOC~~ SUSP: 30.0000 [IU] | Freq: Every day | SUBCUTANEOUS | Status: DC

## 2015-10-10 MED ORDER — LOSARTAN POTASSIUM 100 MG PO TABS: 100.0000 mg | ORAL_TABLET | Freq: Every day | ORAL | Status: DC

## 2015-10-10 MED ORDER — BUDESONIDE-FORMOTEROL FUMARATE 160-4.5 MCG/ACT IN AERO: 2.0000 | INHALATION_SPRAY | Freq: Two times a day (BID) | RESPIRATORY_TRACT | Status: DC

## 2015-10-10 MED ORDER — TIOTROPIUM BROMIDE MONOHYDRATE 18 MCG IN CAPS: 18.0000 ug | ORAL_CAPSULE | Freq: Every day | RESPIRATORY_TRACT | Status: DC

## 2015-10-10 MED ORDER — "INSULIN SYRINGE 31G X 5/16"" 0.5 ML MISC": Status: DC

## 2015-10-10 NOTE — Addendum Note (Signed)
Addended by: Earlene Plater on: 10/10/2015 10:32 AM   Modules accepted: Miquel Dunn

## 2015-10-10 NOTE — Patient Instructions (Signed)
Gastroesophageal Reflux Disease, Adult Normally, food travels down the esophagus and stays in the stomach to be digested. However, when a person has gastroesophageal reflux disease (GERD), food and stomach acid move back up into the esophagus. When this happens, the esophagus becomes sore and inflamed. Over time, GERD can create small holes (ulcers) in the lining of the esophagus.  CAUSES This condition is caused by a problem with the muscle between the esophagus and the stomach (lower esophageal sphincter, or LES). Normally, the LES muscle closes after food passes through the esophagus to the stomach. When the LES is weakened or abnormal, it does not close properly, and that allows food and stomach acid to go back up into the esophagus. The LES can be weakened by certain dietary substances, medicines, and medical conditions, including:  Tobacco use.  Pregnancy.  Having a hiatal hernia.  Heavy alcohol use.  Certain foods and beverages, such as coffee, chocolate, onions, and peppermint. RISK FACTORS This condition is more likely to develop in:  People who have an increased body weight.  People who have connective tissue disorders.  People who use NSAID medicines. SYMPTOMS Symptoms of this condition include:  Heartburn.  Difficult or painful swallowing.  The feeling of having a lump in the throat.  Abitter taste in the mouth.  Bad breath.  Having a large amount of saliva.  Having an upset or bloated stomach.  Belching.  Chest pain.  Shortness of breath or wheezing.  Ongoing (chronic) cough or a night-time cough.  Wearing away of tooth enamel.  Weight loss. Different conditions can cause chest pain. Make sure to see your health care provider if you experience chest pain. DIAGNOSIS Your health care provider will take a medical history and perform a physical exam. To determine if you have mild or severe GERD, your health care provider may also monitor how you respond  to treatment. You may also have other tests, including:  An endoscopy toexamine your stomach and esophagus with a small camera.  A test thatmeasures the acidity level in your esophagus.  A test thatmeasures how much pressure is on your esophagus.  A barium swallow or modified barium swallow to show the shape, size, and functioning of your esophagus. TREATMENT The goal of treatment is to help relieve your symptoms and to prevent complications. Treatment for this condition may vary depending on how severe your symptoms are. Your health care provider may recommend:  Changes to your diet.  Medicine.  Surgery. HOME CARE INSTRUCTIONS Diet  Follow a diet as recommended by your health care provider. This may involve avoiding foods and drinks such as:  Coffee and tea (with or without caffeine).  Drinks that containalcohol.  Energy drinks and sports drinks.  Carbonated drinks or sodas.  Chocolate and cocoa.  Peppermint and mint flavorings.  Garlic and onions.  Horseradish.  Spicy and acidic foods, including peppers, chili powder, curry powder, vinegar, hot sauces, and barbecue sauce.  Citrus fruit juices and citrus fruits, such as oranges, lemons, and limes.  Tomato-based foods, such as red sauce, chili, salsa, and pizza with red sauce.  Fried and fatty foods, such as donuts, french fries, potato chips, and high-fat dressings.  High-fat meats, such as hot dogs and fatty cuts of red and white meats, such as rib eye steak, sausage, ham, and bacon.  High-fat dairy items, such as whole milk, butter, and cream cheese.  Eat small, frequent meals instead of large meals.  Avoid drinking large amounts of liquid with your   meals.  Avoid eating meals during the 2-3 hours before bedtime.  Avoid lying down right after you eat.  Do not exercise right after you eat. General Instructions  Pay attention to any changes in your symptoms.  Take over-the-counter and prescription  medicines only as told by your health care provider. Do not take aspirin, ibuprofen, or other NSAIDs unless your health care provider told you to do so.  Do not use any tobacco products, including cigarettes, chewing tobacco, and e-cigarettes. If you need help quitting, ask your health care provider.  Wear loose-fitting clothing. Do not wear anything tight around your waist that causes pressure on your abdomen.  Raise (elevate) the head of your bed 6 inches (15cm).  Try to reduce your stress, such as with yoga or meditation. If you need help reducing stress, ask your health care provider.  If you are overweight, reduce your weight to an amount that is healthy for you. Ask your health care provider for guidance about a safe weight loss goal.  Keep all follow-up visits as told by your health care provider. This is important. SEEK MEDICAL CARE IF:  You have new symptoms.  You have unexplained weight loss.  You have difficulty swallowing, or it hurts to swallow.  You have wheezing or a persistent cough.  Your symptoms do not improve with treatment.  You have a hoarse voice. SEEK IMMEDIATE MEDICAL CARE IF:  You have pain in your arms, neck, jaw, teeth, or back.  You feel sweaty, dizzy, or light-headed.  You have chest pain or shortness of breath.  You vomit and your vomit looks like blood or coffee grounds.  You faint.  Your stool is bloody or black.  You cannot swallow, drink, or eat.   This information is not intended to replace advice given to you by your health care provider. Make sure you discuss any questions you have with your health care provider.   Document Released: 07/10/2005 Document Revised: 06/21/2015 Document Reviewed: 01/25/2015 Elsevier Interactive Patient Education 2016 [place] Maintenance, Female Adopting a healthy lifestyle and getting preventive care can go a long way to promote health and wellness. Talk with your health care provider about  what schedule of regular examinations is right for you. This is a good chance for you to check in with your provider about disease prevention and staying healthy. In between checkups, there are plenty of things you can do on your own. Experts have done a lot of research about which lifestyle changes and preventive measures are most likely to keep you healthy. Ask your health care provider for more information. WEIGHT AND DIET  Eat a healthy diet  Be sure to include plenty of vegetables, fruits, low-fat dairy products, and lean protein.  Do not eat a lot of foods high in solid fats, added sugars, or salt.  Get regular exercise. This is one of the most important things you can do for your health.  Most adults should exercise for at least 150 minutes each week. The exercise should increase your heart rate and make you sweat (moderate-intensity exercise).  Most adults should also do strengthening exercises at least twice a week. This is in addition to the moderate-intensity exercise.  Maintain a healthy weight  Body mass index (BMI) is a measurement that can be used to identify possible weight problems. It estimates body fat based on height and weight. Your health care provider can help determine your BMI and help you achieve or maintain a healthy weight.  For females 56 years of age and older:   A BMI below 18.5 is considered underweight.  A BMI of 18.5 to 24.9 is normal.  A BMI of 25 to 29.9 is considered overweight.  A BMI of 30 and above is considered obese.  Watch levels of cholesterol and blood lipids  You should start having your blood tested for lipids and cholesterol at 63 years of age, then have this test every 5 years.  You may need to have your cholesterol levels checked more often if:  Your lipid or cholesterol levels are high.  You are older than 63 years of age.  You are at high risk for heart disease.  CANCER SCREENING   Lung Cancer  Lung cancer screening is  recommended for adults 2-60 years old who are at high risk for lung cancer because of a history of smoking.  A yearly low-dose CT scan of the lungs is recommended for people who:  Currently smoke.  Have quit within the past 15 years.  Have at least a 30-pack-year history of smoking. A pack year is smoking an average of one pack of cigarettes a day for 1 year.  Yearly screening should continue until it has been 15 years since you quit.  Yearly screening should stop if you develop a health problem that would prevent you from having lung cancer treatment.  Breast Cancer  Practice breast self-awareness. This means understanding how your breasts normally appear and feel.  It also means doing regular breast self-exams. Let your health care provider know about any changes, no matter how small.  If you are in your 20s or 30s, you should have a clinical breast exam (CBE) by a health care provider every 1-3 years as part of a regular health exam.  If you are 17 or older, have a CBE every year. Also consider having a breast X-ray (mammogram) every year.  If you have a family history of breast cancer, talk to your health care provider about genetic screening.  If you are at high risk for breast cancer, talk to your health care provider about having an MRI and a mammogram every year.  Breast cancer gene (BRCA) assessment is recommended for women who have family members with BRCA-related cancers. BRCA-related cancers include:  Breast.  Ovarian.  Tubal.  Peritoneal cancers.  Results of the assessment will determine the need for genetic counseling and BRCA1 and BRCA2 testing. Cervical Cancer Your health care provider may recommend that you be screened regularly for cancer of the pelvic organs (ovaries, uterus, and vagina). This screening involves a pelvic examination, including checking for microscopic changes to the surface of your cervix (Pap test). You may be encouraged to have this  screening done every 3 years, beginning at age 73.  For women ages 23-65, health care providers may recommend pelvic exams and Pap testing every 3 years, or they may recommend the Pap and pelvic exam, combined with testing for human papilloma virus (HPV), every 5 years. Some types of HPV increase your risk of cervical cancer. Testing for HPV may also be done on women of any age with unclear Pap test results.  Other health care providers may not recommend any screening for nonpregnant women who are considered low risk for pelvic cancer and who do not have symptoms. Ask your health care provider if a screening pelvic exam is right for you.  If you have had past treatment for cervical cancer or a condition that could lead to cancer, you  need Pap tests and screening for cancer for at least 20 years after your treatment. If Pap tests have been discontinued, your risk factors (such as having a new sexual partner) need to be reassessed to determine if screening should resume. Some women have medical problems that increase the chance of getting cervical cancer. In these cases, your health care provider may recommend more frequent screening and Pap tests. Colorectal Cancer  This type of cancer can be detected and often prevented.  Routine colorectal cancer screening usually begins at 63 years of age and continues through 63 years of age.  Your health care provider may recommend screening at an earlier age if you have risk factors for colon cancer.  Your health care provider may also recommend using home test kits to check for hidden blood in the stool.  A small camera at the end of a tube can be used to examine your colon directly (sigmoidoscopy or colonoscopy). This is done to check for the earliest forms of colorectal cancer.  Routine screening usually begins at age 74.  Direct examination of the colon should be repeated every 5-10 years through 63 years of age. However, you may need to be screened  more often if early forms of precancerous polyps or small growths are found. Skin Cancer  Check your skin from head to toe regularly.  Tell your health care provider about any new moles or changes in moles, especially if there is a change in a mole's shape or color.  Also tell your health care provider if you have a mole that is larger than the size of a pencil eraser.  Always use sunscreen. Apply sunscreen liberally and repeatedly throughout the day.  Protect yourself by wearing long sleeves, pants, a wide-brimmed hat, and sunglasses whenever you are outside. HEART DISEASE, DIABETES, AND HIGH BLOOD PRESSURE   High blood pressure causes heart disease and increases the risk of stroke. High blood pressure is more likely to develop in:  People who have blood pressure in the high end of the normal range (130-139/85-89 mm Hg).  People who are overweight or obese.  People who are African American.  If you are 7-56 years of age, have your blood pressure checked every 3-5 years. If you are 74 years of age or older, have your blood pressure checked every year. You should have your blood pressure measured twice--once when you are at a hospital or clinic, and once when you are not at a hospital or clinic. Record the average of the two measurements. To check your blood pressure when you are not at a hospital or clinic, you can use:  An automated blood pressure machine at a pharmacy.  A home blood pressure monitor.  If you are between 11 years and 16 years old, ask your health care provider if you should take aspirin to prevent strokes.  Have regular diabetes screenings. This involves taking a blood sample to check your fasting blood sugar level.  If you are at a normal weight and have a low risk for diabetes, have this test once every three years after 63 years of age.  If you are overweight and have a high risk for diabetes, consider being tested at a younger age or more often. PREVENTING  INFECTION  Hepatitis B  If you have a higher risk for hepatitis B, you should be screened for this virus. You are considered at high risk for hepatitis B if:  You were born in a country where hepatitis B  is common. Ask your health care provider which countries are considered high risk.  Your parents were born in a high-risk country, and you have not been immunized against hepatitis B (hepatitis B vaccine).  You have HIV or AIDS.  You use needles to inject street drugs.  You live with someone who has hepatitis B.  You have had sex with someone who has hepatitis B.  You get hemodialysis treatment.  You take certain medicines for conditions, including cancer, organ transplantation, and autoimmune conditions. Hepatitis C  Blood testing is recommended for:  Everyone born from [birth year] through 1964.  Anyone with known risk factors for hepatitis C. Sexually transmitted infections (STIs)  You should be screened for sexually transmitted infections (STIs) including gonorrhea and chlamydia if:  You are sexually active and are younger than 63 years of age.  You are older than 63 years of age and your health care provider tells you that you are at risk for this type of infection.  Your sexual activity has changed since you were last screened and you are at an increased risk for chlamydia or gonorrhea. Ask your health care provider if you are at risk.  If you do not have HIV, but are at risk, it may be recommended that you take a prescription medicine daily to prevent HIV infection. This is called pre-exposure prophylaxis (PrEP). You are considered at risk if:  You are sexually active and do not regularly use condoms or know the HIV status of your partner(s).  You take drugs by injection.  You are sexually active with a partner who has HIV. Talk with your health care provider about whether you are at high risk of being infected with HIV. If you choose to begin PrEP, you should first be  tested for HIV. You should then be tested every 3 months for as long as you are taking PrEP.  PREGNANCY   If you are premenopausal and you may become pregnant, ask your health care provider about preconception counseling.  If you may become pregnant, take 400 to 800 micrograms (mcg) of folic acid every day.  If you want to prevent pregnancy, talk to your health care provider about birth control (contraception). OSTEOPOROSIS AND MENOPAUSE   Osteoporosis is a disease in which the bones lose minerals and strength with aging. This can result in serious bone fractures. Your risk for osteoporosis can be identified using a bone density scan.  If you are 26 years of age or older, or if you are at risk for osteoporosis and fractures, ask your health care provider if you should be screened.  Ask your health care provider whether you should take a calcium or vitamin D supplement to lower your risk for osteoporosis.  Menopause may have certain physical symptoms and risks.  Hormone replacement therapy may reduce some of these symptoms and risks. Talk to your health care provider about whether hormone replacement therapy is right for you.  HOME CARE INSTRUCTIONS   Schedule regular health, dental, and eye exams.  Stay current with your immunizations.   Do not use any tobacco products including cigarettes, chewing tobacco, or electronic cigarettes.  If you are pregnant, do not drink alcohol.  If you are breastfeeding, limit how much and how often you drink alcohol.  Limit alcohol intake to no more than 1 drink per day for nonpregnant women. One drink equals 12 ounces of beer, 5 ounces of wine, or 1 ounces of hard liquor.  Do not use street drugs.  Do not share needles.  Ask your health care provider for help if you need support or information about quitting drugs.  Tell your health care provider if you often feel depressed.  Tell your health care provider if you have ever been abused or  do not feel safe at home.   This information is not intended to replace advice given to you by your health care provider. Make sure you discuss any questions you have with your health care provider.   Document Released: 04/15/2011 Document Revised: 10/21/2014 Document Reviewed: 09/01/2013 Elsevier Interactive Patient Education [employer].

## 2015-10-10 NOTE — Progress Notes (Signed)
Subjective:    Patient ID: Jamie [name], female    DOB: [birth year], 64 y.o.   MRN: [id number]  Pt presents to the office today for chronic follow up.  Diabetes She presents for her follow-up diabetic visit. She has type 2 diabetes mellitus. Her disease course has been stable. There are no hypoglycemic associated symptoms. Pertinent negatives for diabetes include no fatigue, no foot paresthesias and no foot ulcerations. Symptoms are stable. Pertinent negatives for diabetic complications include no CVA, heart disease, nephropathy or peripheral neuropathy. Risk factors for coronary artery disease include diabetes mellitus, dyslipidemia, family history, hypertension and post-menopausal. Current diabetic treatment includes insulin injections. She is following a diabetic diet. Her breakfast blood glucose range is generally 90-110 mg/dl. An ACE inhibitor/angiotensin II receptor blocker is being taken. Eye exam is current.  Hypertension This is a chronic problem. The current episode started more than 1 year ago. The problem has been waxing and waning since onset. The problem is uncontrolled. Pertinent negatives include no anxiety, palpitations, peripheral edema or shortness of breath. Risk factors for coronary artery disease include diabetes mellitus, dyslipidemia, family history, post-menopausal state and smoking/tobacco exposure. Past treatments include alpha 1 blockers and calcium channel blockers. The current treatment provides moderate improvement. There is no history of kidney disease, CAD/MI, CVA, heart failure or a thyroid problem. There is no history of sleep apnea.  Hyperlipidemia This is a chronic problem. The current episode started more than 1 year ago. The problem is controlled. Recent lipid tests were reviewed and are normal. Exacerbating diseases include diabetes. She has no history of hypothyroidism. Factors aggravating her hyperlipidemia include smoking. Pertinent negatives include no leg  pain or shortness of breath. She is currently on no antihyperlipidemic treatment. The current treatment provides significant improvement of lipids. Risk factors for coronary artery disease include diabetes mellitus, dyslipidemia, family history, hypertension and a sedentary lifestyle.  Gastroesophageal Reflux She complains of coughing, heartburn and nausea. She reports no wheezing. This is a chronic problem. The current episode started more than 1 year ago. The problem occurs frequently. The problem has been resolved. The symptoms are aggravated by certain foods and lying down. Pertinent negatives include no fatigue. Risk factors include smoking/tobacco exposure. She has tried a PPI (Prilosec ) for the symptoms. The treatment provided mild relief.      Review of Systems  Constitutional: Negative.  Negative for fatigue.  HENT: Negative.   Eyes: Negative.   Respiratory: Positive for cough. Negative for shortness of breath and wheezing.   Cardiovascular: Negative.  Negative for palpitations.  Gastrointestinal: Positive for heartburn and nausea.  Endocrine: Negative.   Genitourinary: Negative.   Musculoskeletal: Negative.   Neurological: Negative.   Hematological: Negative.   Psychiatric/Behavioral: Negative.   All other systems reviewed and are negative.      Objective:   Physical Exam  Constitutional: She is oriented to person, place, and time. She appears well-developed and well-nourished. No distress.  HENT:  Head: Normocephalic and atraumatic.  Right Ear: External ear normal.  Left Ear: External ear normal.  Mouth/Throat: Oropharynx is clear and moist.  Eyes: Pupils are equal, round, and reactive to light.  Neck: Normal range of motion. Neck supple. No thyromegaly present.  Cardiovascular: Normal rate, regular rhythm, normal heart sounds and intact distal pulses.   No murmur heard. Pulmonary/Chest: Effort normal and breath sounds normal. No respiratory distress. She has no  wheezes.  Abdominal: Soft. Bowel sounds are normal. She exhibits no distension. There is no tenderness.  Musculoskeletal: Normal range of motion. She exhibits no edema or tenderness.  Neurological: She is alert and oriented to person, place, and time. She has normal reflexes. No cranial nerve deficit.  Skin: Skin is warm and dry.  Psychiatric: She has a normal mood and affect. Her behavior is normal. Judgment and thought content normal.  Vitals reviewed.     BP 162/86 mmHg  Pulse 73  Temp(Src) 97.5 F (36.4 C) (Oral)  Ht '5\' 2"'  (1.575 m)  Wt 101 lb 12.8 oz (46.176 kg)  BMI 18.61 kg/m2     Assessment & Plan:  1. Essential hypertension - CMP14+EGFR - losartan (COZAAR) 100 MG tablet; Take 1 tablet (100 mg total) by mouth daily.  Dispense: 90 tablet; Refill: 3 - amLODipine (NORVASC) 5 MG tablet; Take 1 tablet (5 mg total) by mouth daily.  Dispense: 90 tablet; Refill: 3  2. HLD (hyperlipidemia) - CMP14+EGFR - Lipid panel  3. Smoking - CMP14+EGFR  4. Vitamin D deficiency  - CMP14+EGFR  5. Hard of hearing, bilateral - CMP14+EGFR  6. Type 2 diabetes mellitus with hyperglycemia, without long-term current use of insulin (HCC)  - POCT glycosylated hemoglobin (Hb A1C) - CMP14+EGFR - Insulin Syringe-Needle U-100 (INSULIN SYRINGE .5CC/31GX5/16") 31G X 5/16" 0.5 ML MISC; BID  Dispense: 100 each; Refill: 6 - insulin NPH-regular Human (NOVOLIN 70/30) (70-30) 100 UNIT/ML injection; Inject 30 Units into the skin daily with breakfast. 30 units in AM and 10 units in PM  Dispense: 40 mL; Refill: 11  7. Gastroesophageal reflux disease, esophagitis presence not specified -Pt started on Nexium today - CMP14+EGFR - esomeprazole (NEXIUM) 20 MG capsule; Take 1 capsule (20 mg total) by mouth daily at 12 noon.  Dispense: 90 capsule; Refill: 1  8. Moderate COPD (chronic obstructive pulmonary disease) (HCC)  - CMP14+EGFR - budesonide-formoterol (SYMBICORT) 160-4.5 MCG/ACT inhaler; Inhale 2  puffs into the lungs 2 (two) times daily.  Dispense: 1 Inhaler; Refill: 6 - tiotropium (SPIRIVA HANDIHALER) 18 MCG inhalation capsule; Place 1 capsule (18 mcg total) into inhaler and inhale daily.  Dispense: 30 capsule; Refill: 6  9. Coronary artery calcification  - CMP14+EGFR  10. Colon cancer screening  - CMP14+EGFR - Fecal occult blood, imunochemical; Future  11. Need for hepatitis C screening test  - CMP14+EGFR - Hepatitis C antibody  12. Type 2 diabetes mellitus with complication, without long-term current use of insulin (HCC)  - insulin NPH-regular Human (NOVOLIN 70/30) (70-30) 100 UNIT/ML injection; Inject 30 Units into the skin daily with breakfast. 30 units in AM and 10 units in PM  Dispense: 40 mL; Refill: 11   Continue all meds Labs pending Health Maintenance reviewed Diet and exercise encouraged RTO 3 months   Evelina Dun, FNP

## 2015-10-11 LAB — CMP14+EGFR
ALBUMIN: 3.8 g/dL (ref 3.6–4.8)
ALT: 14 IU/L (ref 0–32)
AST: 17 IU/L (ref 0–40)
Albumin/Globulin Ratio: 1.9 (ref 1.1–2.5)
Alkaline Phosphatase: 83 IU/L (ref 39–117)
BUN / CREAT RATIO: 17 (ref 11–26)
BUN: 16 mg/dL (ref 8–27)
Bilirubin Total: 0.5 mg/dL (ref 0.0–1.2)
CALCIUM: 9.7 mg/dL (ref 8.7–10.3)
CO2: 23 mmol/L (ref 18–29)
CREATININE: 0.96 mg/dL (ref 0.57–1.00)
Chloride: 94 mmol/L — ABNORMAL LOW (ref 96–106)
GFR calc Af Amer: 73 mL/min/{1.73_m2} (ref >59)
GFR calc non Af Amer: 63 mL/min/{1.73_m2} (ref >59)
GLOBULIN, TOTAL: 2 g/dL (ref 1.5–4.5)
Glucose: 115 mg/dL — ABNORMAL HIGH (ref 65–99)
Potassium: 4.4 mmol/L (ref 3.5–5.2)
Sodium: 136 mmol/L (ref 134–144)
Total Protein: 5.8 g/dL — ABNORMAL LOW (ref 6.0–8.5)

## 2015-10-11 LAB — LIPID PANEL
Chol/HDL Ratio: 3.4 ratio units (ref 0.0–4.4)
Cholesterol, Total: 206 mg/dL — ABNORMAL HIGH (ref 100–199)
HDL: 61 mg/dL (ref >39)
LDL CALC: 129 mg/dL — AB (ref 0–99)
TRIGLYCERIDES: 79 mg/dL (ref 0–149)
VLDL CHOLESTEROL CAL: 16 mg/dL (ref 5–40)

## 2015-10-11 LAB — HEPATITIS C ANTIBODY: Hep C Virus Ab: <0.1 s/co ratio (ref 0.0–0.9)

## 2015-10-30 ENCOUNTER — Telehealth: Payer: Self-pay | Admitting: Family

## 2015-10-30 NOTE — Telephone Encounter (Signed)
scheduled

## 2015-11-29 ENCOUNTER — Telehealth: Payer: Self-pay | Admitting: Family

## 2015-11-30 DIAGNOSIS — H2513 Age-related nuclear cataract, bilateral: Secondary | ICD-10-CM | POA: Diagnosis not present

## 2015-12-07 DIAGNOSIS — H2511 Age-related nuclear cataract, right eye: Secondary | ICD-10-CM | POA: Diagnosis not present

## 2015-12-14 LAB — HM DIABETES EYE EXAM

## 2015-12-28 DIAGNOSIS — H2512 Age-related nuclear cataract, left eye: Secondary | ICD-10-CM | POA: Diagnosis not present

## 2016-01-11 ENCOUNTER — Ambulatory Visit: Payer: Commercial Managed Care - HMO | Admitting: Family

## 2016-01-18 IMAGING — CT CT RENAL STONE PROTOCOL
3 of 4 series · 8 of 46 positions shown, 15 images · non-contrast
Comparison: None.

CLINICAL DATA: Dull constant right lower quadrant and umbilical
pain for 2 weeks.

EXAM:
CT ABDOMEN AND PELVIS WITHOUT CONTRAST
TECHNIQUE: Multidetector CT imaging of the abdomen and pelvis was performed
following the standard protocol without IV contrast.

[Series 3: mpr coronal 3.0mm · coronal · 0.62mm/px · 3 of 81 slices shown, 4 images]
[im 27/81  soft-tissue]
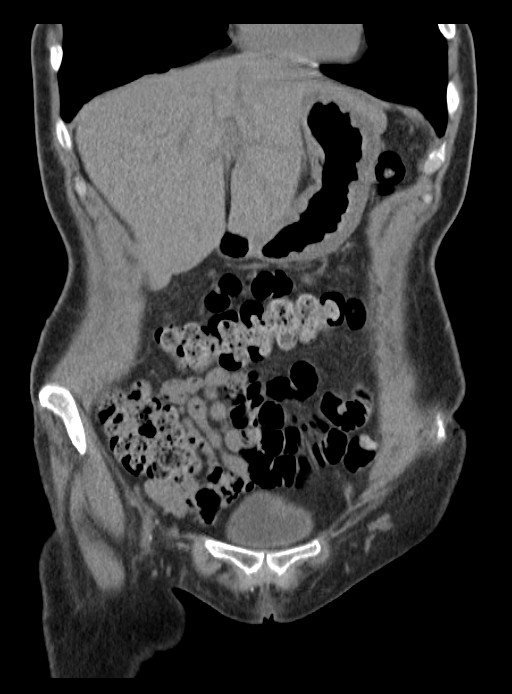
[im 36/81  soft-tissue]
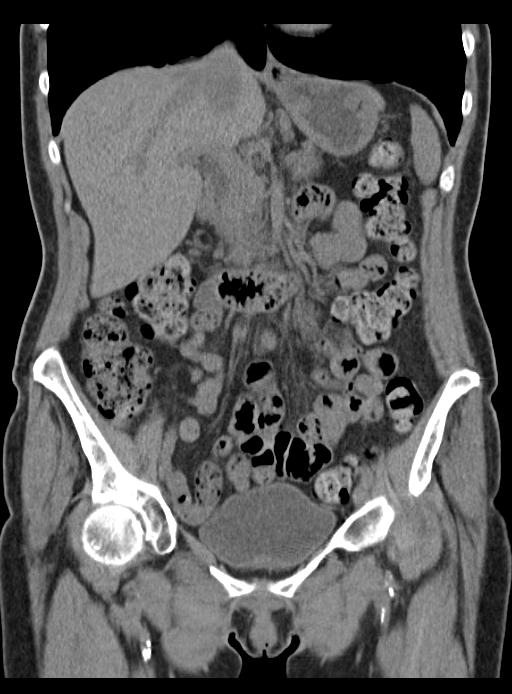
[im 36/81  bone]
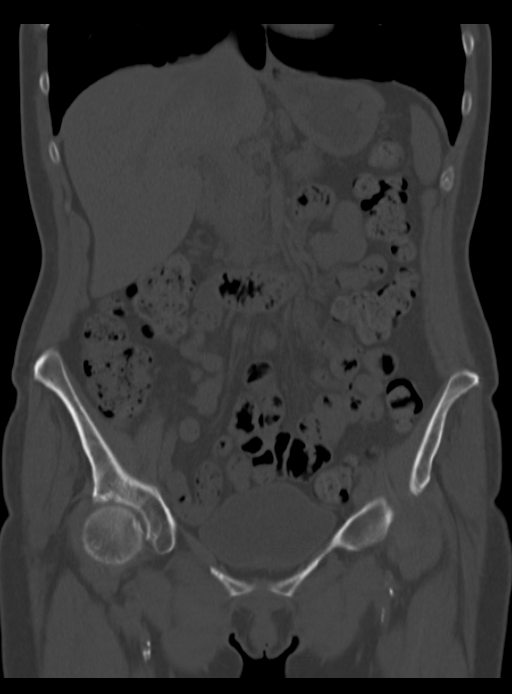
[im 45/81  soft-tissue]
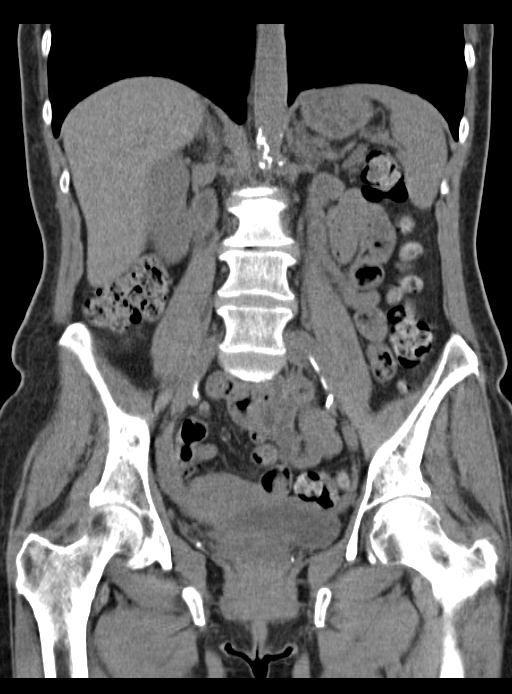

[Series 4: mpr sagittal 3.0mm · sagittal · 0.49mm/px · 1 of 104 slices shown, 2 images]
[im 35/104  soft-tissue]
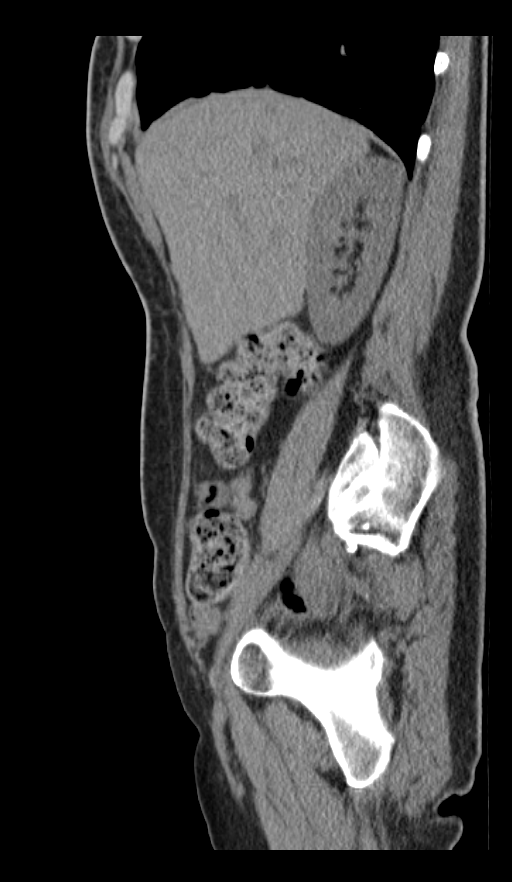
[im 35/104  bone]
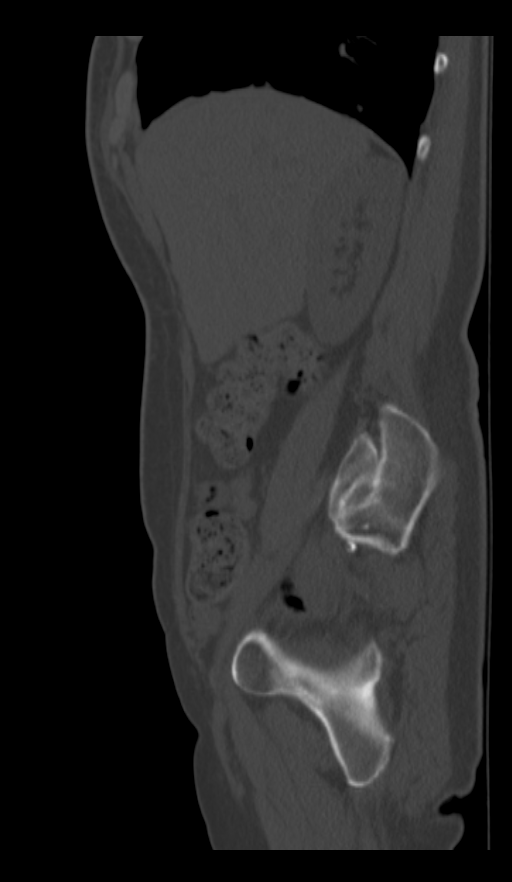

[Series 6: lung 5.0 b60f · axial · 0.58mm/px · z∈[+1294,+1340]mm · 4 of 17 slices shown, 9 images]
[im 4/17  soft-tissue]
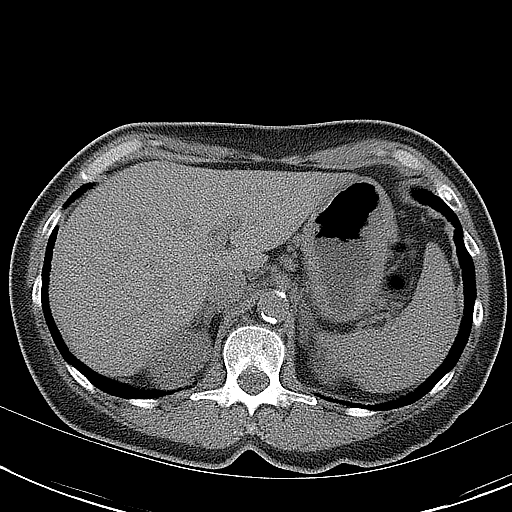
[im 4/17  lung]
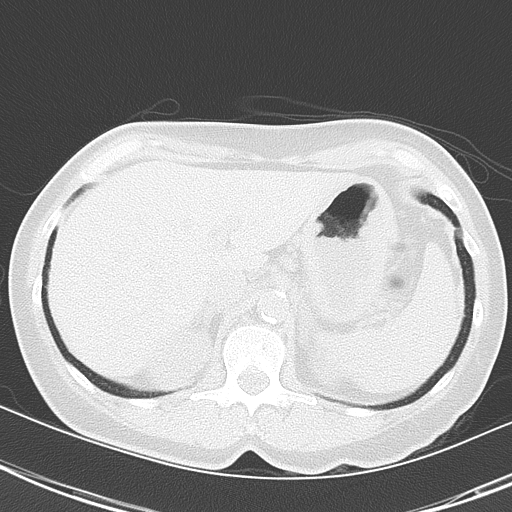
[im 4/17  bone]
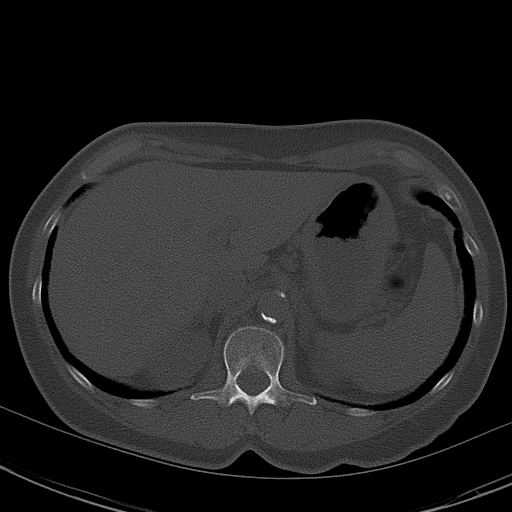
[im 7/17  soft-tissue]
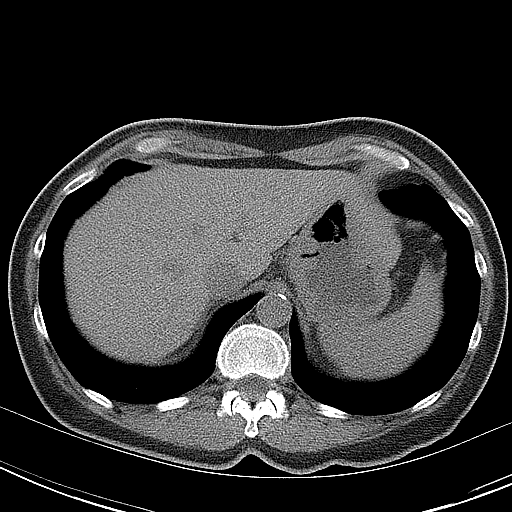
[im 7/17  lung]
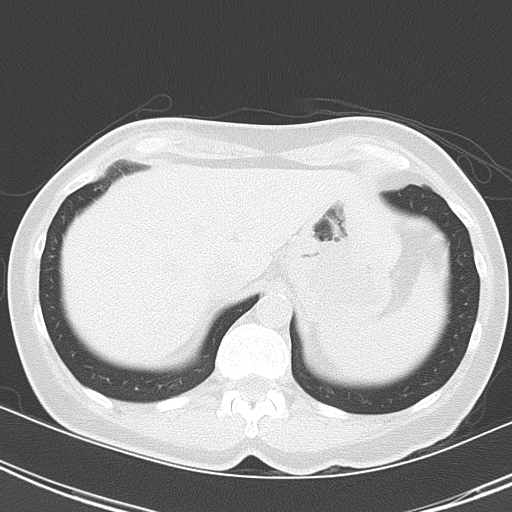
[im 10/17  soft-tissue]
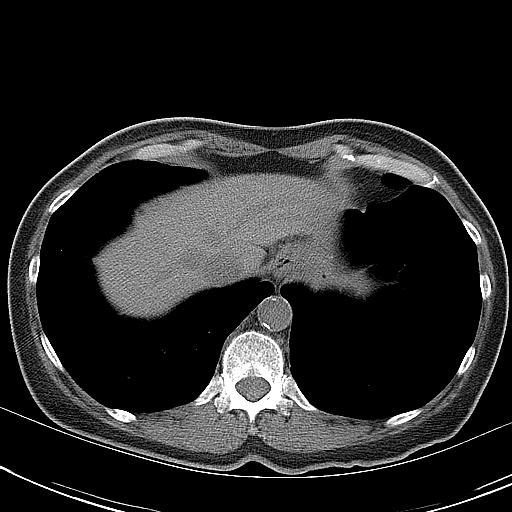
[im 10/17  lung]
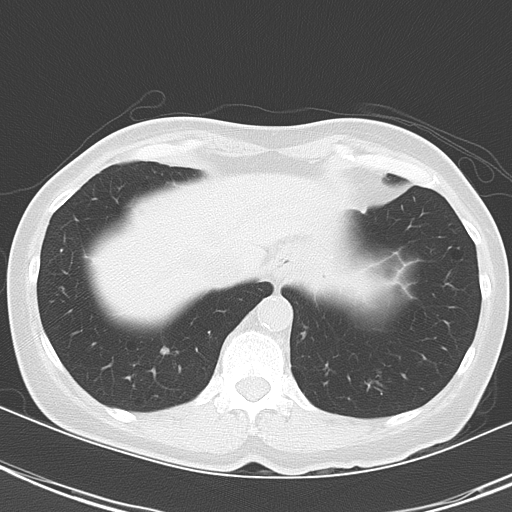
[im 13/17  soft-tissue]
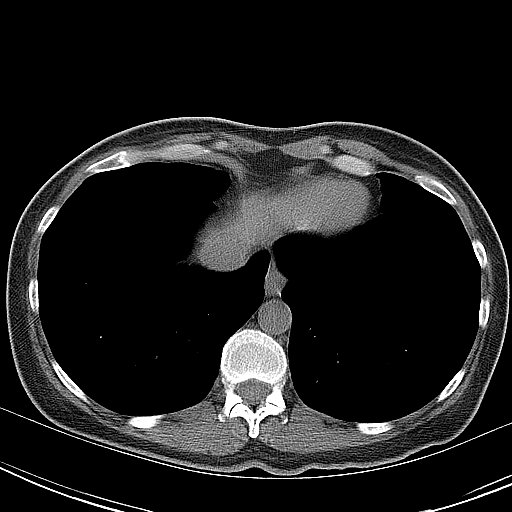
[im 13/17  lung]
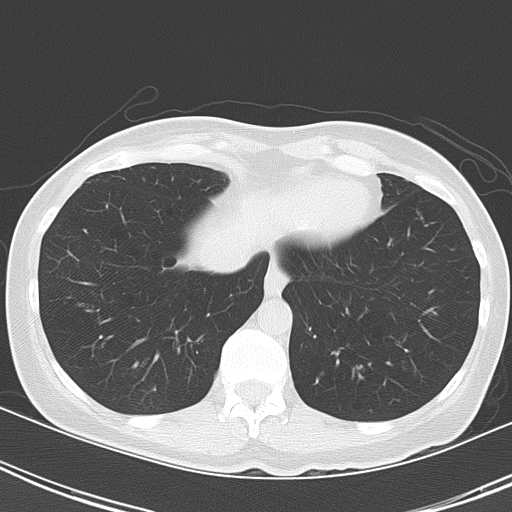

[8 of 46 positions shown; findings below may reference images not displayed]

FINDINGS: There are no urinary calculi. There is no hydronephrosis or ureteral
dilatation. There are unremarkable unenhanced appearances of the
liver, spleen, pancreas, adrenals and kidneys. Bowel appears
unremarkable. Uterus and adnexal regions appear unremarkable. The
abdominal aorta is normal in caliber with dense atherosclerotic
calcifications. No significant abnormality is evident in the lung
bases. No acute inflammatory changes are evident in the abdomen or
pelvis. There is no ascites. There is no adenopathy. There is
moderate degenerative disc disease in the lumbar spine, greatest at
L2-3.
IMPRESSION: No acute findings in the abdomen or pelvis.

## 2016-01-30 ENCOUNTER — Ambulatory Visit (INDEPENDENT_AMBULATORY_CARE_PROVIDER_SITE_OTHER): Payer: Commercial Managed Care - HMO | Admitting: Family

## 2016-01-30 ENCOUNTER — Encounter: Payer: Self-pay | Admitting: Family

## 2016-01-30 VITALS — BP 130/80 | HR 82 | Temp 97.7°F | Ht 62.0 in | Wt 95.4 lb

## 2016-01-30 DIAGNOSIS — E559 Vitamin D deficiency, unspecified: Secondary | ICD-10-CM | POA: Diagnosis not present

## 2016-01-30 DIAGNOSIS — Z114 Encounter for screening for human immunodeficiency virus [HIV]: Secondary | ICD-10-CM

## 2016-01-30 DIAGNOSIS — I10 Essential (primary) hypertension: Secondary | ICD-10-CM | POA: Diagnosis not present

## 2016-01-30 DIAGNOSIS — J449 Chronic obstructive pulmonary disease, unspecified: Secondary | ICD-10-CM

## 2016-01-30 DIAGNOSIS — I2510 Atherosclerotic heart disease of native coronary artery without angina pectoris: Secondary | ICD-10-CM | POA: Diagnosis not present

## 2016-01-30 DIAGNOSIS — E785 Hyperlipidemia, unspecified: Secondary | ICD-10-CM | POA: Diagnosis not present

## 2016-01-30 DIAGNOSIS — E1165 Type 2 diabetes mellitus with hyperglycemia: Secondary | ICD-10-CM | POA: Diagnosis not present

## 2016-01-30 DIAGNOSIS — Z1211 Encounter for screening for malignant neoplasm of colon: Secondary | ICD-10-CM

## 2016-01-30 DIAGNOSIS — Z720 Tobacco use: Secondary | ICD-10-CM

## 2016-01-30 DIAGNOSIS — K219 Gastro-esophageal reflux disease without esophagitis: Secondary | ICD-10-CM | POA: Diagnosis not present

## 2016-01-30 DIAGNOSIS — F17200 Nicotine dependence, unspecified, uncomplicated: Secondary | ICD-10-CM

## 2016-01-30 DIAGNOSIS — I2584 Coronary atherosclerosis due to calcified coronary lesion: Secondary | ICD-10-CM

## 2016-01-30 LAB — BAYER DCA HB A1C WAIVED: HB A1C: 8 % — AB (ref <7.0)

## 2016-01-30 MED ORDER — ASPIRIN EC 81 MG PO TBEC
81.0000 mg | DELAYED_RELEASE_TABLET | Freq: Every day | ORAL | Status: DC
Start: 2016-01-30 — End: 2018-09-08

## 2016-01-30 MED ORDER — UMECLIDINIUM BROMIDE 62.5 MCG/INH IN AEPB: 1.0000 | INHALATION_SPRAY | Freq: Every day | RESPIRATORY_TRACT | Status: DC

## 2016-01-30 MED ORDER — GLUCOSE BLOOD VI STRP: ORAL_STRIP | Status: DC

## 2016-01-30 NOTE — Addendum Note (Signed)
Addended by: Evelina Dun A on: 01/30/2016 10:33 AM   Modules accepted: Orders, SmartSet

## 2016-01-30 NOTE — Addendum Note (Signed)
Addended by: Evelina Dun A on: 01/30/2016 11:00 AM   Modules accepted: Orders, SmartSet

## 2016-01-30 NOTE — Patient Instructions (Signed)
Health Maintenance, Female Adopting a healthy lifestyle and getting preventive care can go a long way to promote health and wellness. Talk with your health care provider about what schedule of regular examinations is right for you. This is a good chance for you to check in with your provider about disease prevention and staying healthy. In between checkups, there are plenty of things you can do on your own. Experts have done a lot of research about which lifestyle changes and preventive measures are most likely to keep you healthy. Ask your health care provider for more information. WEIGHT AND DIET  Eat a healthy diet  Be sure to include plenty of vegetables, fruits, low-fat dairy products, and lean protein.  Do not eat a lot of foods high in solid fats, added sugars, or salt.  Get regular exercise. This is one of the most important things you can do for your health.  Most adults should exercise for at least 150 minutes each week. The exercise should increase your heart rate and make you sweat (moderate-intensity exercise).  Most adults should also do strengthening exercises at least twice a week. This is in addition to the moderate-intensity exercise.  Maintain a healthy weight  Body mass index (BMI) is a measurement that can be used to identify possible weight problems. It estimates body fat based on height and weight. Your health care provider can help determine your BMI and help you achieve or maintain a healthy weight.  For females 20 years of age and older:   A BMI below 18.5 is considered underweight.  A BMI of 18.5 to 24.9 is normal.  A BMI of 25 to 29.9 is considered overweight.  A BMI of 30 and above is considered obese.  Watch levels of cholesterol and blood lipids  You should start having your blood tested for lipids and cholesterol at 64 years of age, then have this test every 5 years.  You may need to have your cholesterol levels checked more often if:  Your lipid  or cholesterol levels are high.  You are older than 64 years of age.  You are at high risk for heart disease.  CANCER SCREENING   Lung Cancer  Lung cancer screening is recommended for adults 55-80 years old who are at high risk for lung cancer because of a history of smoking.  A yearly low-dose CT scan of the lungs is recommended for people who:  Currently smoke.  Have quit within the past 15 years.  Have at least a 30-pack-year history of smoking. A pack year is smoking an average of one pack of cigarettes a day for 1 year.  Yearly screening should continue until it has been 15 years since you quit.  Yearly screening should stop if you develop a health problem that would prevent you from having lung cancer treatment.  Breast Cancer  Practice breast self-awareness. This means understanding how your breasts normally appear and feel.  It also means doing regular breast self-exams. Let your health care provider know about any changes, no matter how small.  If you are in your 20s or 30s, you should have a clinical breast exam (CBE) by a health care provider every 1-3 years as part of a regular health exam.  If you are 40 or older, have a CBE every year. Also consider having a breast X-ray (mammogram) every year.  If you have a family history of breast cancer, talk to your health care provider about genetic screening.  If you   are at high risk for breast cancer, talk to your health care provider about having an MRI and a mammogram every year.  Breast cancer gene (BRCA) assessment is recommended for women who have family members with BRCA-related cancers. BRCA-related cancers include:  Breast.  Ovarian.  Tubal.  Peritoneal cancers.  Results of the assessment will determine the need for genetic counseling and BRCA1 and BRCA2 testing. Cervical Cancer Your health care provider may recommend that you be screened regularly for cancer of the pelvic organs (ovaries, uterus, and  vagina). This screening involves a pelvic examination, including checking for microscopic changes to the surface of your cervix (Pap test). You may be encouraged to have this screening done every 3 years, beginning at age 21.  For women ages 30-65, health care providers may recommend pelvic exams and Pap testing every 3 years, or they may recommend the Pap and pelvic exam, combined with testing for human papilloma virus (HPV), every 5 years. Some types of HPV increase your risk of cervical cancer. Testing for HPV may also be done on women of any age with unclear Pap test results.  Other health care providers may not recommend any screening for nonpregnant women who are considered low risk for pelvic cancer and who do not have symptoms. Ask your health care provider if a screening pelvic exam is right for you.  If you have had past treatment for cervical cancer or a condition that could lead to cancer, you need Pap tests and screening for cancer for at least 20 years after your treatment. If Pap tests have been discontinued, your risk factors (such as having a new sexual partner) need to be reassessed to determine if screening should resume. Some women have medical problems that increase the chance of getting cervical cancer. In these cases, your health care provider may recommend more frequent screening and Pap tests. Colorectal Cancer  This type of cancer can be detected and often prevented.  Routine colorectal cancer screening usually begins at 64 years of age and continues through 64 years of age.  Your health care provider may recommend screening at an earlier age if you have risk factors for colon cancer.  Your health care provider may also recommend using home test kits to check for hidden blood in the stool.  A small camera at the end of a tube can be used to examine your colon directly (sigmoidoscopy or colonoscopy). This is done to check for the earliest forms of colorectal  cancer.  Routine screening usually begins at age 50.  Direct examination of the colon should be repeated every 5-10 years through 64 years of age. However, you may need to be screened more often if early forms of precancerous polyps or small growths are found. Skin Cancer  Check your skin from head to toe regularly.  Tell your health care provider about any new moles or changes in moles, especially if there is a change in a mole's shape or color.  Also tell your health care provider if you have a mole that is larger than the size of a pencil eraser.  Always use sunscreen. Apply sunscreen liberally and repeatedly throughout the day.  Protect yourself by wearing long sleeves, pants, a wide-brimmed hat, and sunglasses whenever you are outside. HEART DISEASE, DIABETES, AND HIGH BLOOD PRESSURE   High blood pressure causes heart disease and increases the risk of stroke. High blood pressure is more likely to develop in:  People who have blood pressure in the high end   of the normal range (130-139/85-89 mm Hg).  People who are overweight or obese.  People who are African American.  If you are 38-23 years of age, have your blood pressure checked every 3-5 years. If you are 61 years of age or older, have your blood pressure checked every year. You should have your blood pressure measured twice--once when you are at a hospital or clinic, and once when you are not at a hospital or clinic. Record the average of the two measurements. To check your blood pressure when you are not at a hospital or clinic, you can use:  An automated blood pressure machine at a pharmacy.  A home blood pressure monitor.  If you are between 45 years and 39 years old, ask your health care provider if you should take aspirin to prevent strokes.  Have regular diabetes screenings. This involves taking a blood sample to check your fasting blood sugar level.  If you are at a normal weight and have a low risk for diabetes,  have this test once every three years after 64 years of age.  If you are overweight and have a high risk for diabetes, consider being tested at a younger age or more often. PREVENTING INFECTION  Hepatitis B  If you have a higher risk for hepatitis B, you should be screened for this virus. You are considered at high risk for hepatitis B if:  You were born in a country where hepatitis B is common. Ask your health care provider which countries are considered high risk.  Your parents were born in a high-risk country, and you have not been immunized against hepatitis B (hepatitis B vaccine).  You have HIV or AIDS.  You use needles to inject street drugs.  You live with someone who has hepatitis B.  You have had sex with someone who has hepatitis B.  You get hemodialysis treatment.  You take certain medicines for conditions, including cancer, organ transplantation, and autoimmune conditions. Hepatitis C  Blood testing is recommended for:  Everyone born from [birth year] through 1964.  Anyone with known risk factors for hepatitis C. Sexually transmitted infections (STIs)  You should be screened for sexually transmitted infections (STIs) including gonorrhea and chlamydia if:  You are sexually active and are younger than 64 years of age.  You are older than 64 years of age and your health care provider tells you that you are at risk for this type of infection.  Your sexual activity has changed since you were last screened and you are at an increased risk for chlamydia or gonorrhea. Ask your health care provider if you are at risk.  If you do not have HIV, but are at risk, it may be recommended that you take a prescription medicine daily to prevent HIV infection. This is called pre-exposure prophylaxis (PrEP). You are considered at risk if:  You are sexually active and do not regularly use condoms or know the HIV status of your partner(s).  You take drugs by injection.  You are sexually  active with a partner who has HIV. Talk with your health care provider about whether you are at high risk of being infected with HIV. If you choose to begin PrEP, you should first be tested for HIV. You should then be tested every 3 months for as long as you are taking PrEP.  PREGNANCY   If you are premenopausal and you may become pregnant, ask your health care provider about preconception counseling.  If you may  become pregnant, take 400 to 800 micrograms (mcg) of folic acid every day.  If you want to prevent pregnancy, talk to your health care provider about birth control (contraception). OSTEOPOROSIS AND MENOPAUSE   Osteoporosis is a disease in which the bones lose minerals and strength with aging. This can result in serious bone fractures. Your risk for osteoporosis can be identified using a bone density scan.  If you are 61 years of age or older, or if you are at risk for osteoporosis and fractures, ask your health care provider if you should be screened.  Ask your health care provider whether you should take a calcium or vitamin D supplement to lower your risk for osteoporosis.  Menopause may have certain physical symptoms and risks.  Hormone replacement therapy may reduce some of these symptoms and risks. Talk to your health care provider about whether hormone replacement therapy is right for you.  HOME CARE INSTRUCTIONS   Schedule regular health, dental, and eye exams.  Stay current with your immunizations.   Do not use any tobacco products including cigarettes, chewing tobacco, or electronic cigarettes.  If you are pregnant, do not drink alcohol.  If you are breastfeeding, limit how much and how often you drink alcohol.  Limit alcohol intake to no more than 1 drink per day for nonpregnant women. One drink equals 12 ounces of beer, 5 ounces of wine, or 1 ounces of hard liquor.  Do not use street drugs.  Do not share needles.  Ask your health care provider for help if  you need support or information about quitting drugs.  Tell your health care provider if you often feel depressed.  Tell your health care provider if you have ever been abused or do not feel safe at home.   This information is not intended to replace advice given to you by your health care provider. Make sure you discuss any questions you have with your health care provider.   Document Released: 04/15/2011 Document Revised: 10/21/2014 Document Reviewed: 09/01/2013 Elsevier Interactive Patient Education [employer].

## 2016-01-30 NOTE — Progress Notes (Signed)
Subjective:    Patient ID: Jamie [name], female    DOB: 11/13/1950, 65 y.o.   MRN: [id number]  Pt presents to the office today for chronic follow up. Of diabetes, hypertension and gastric reflux.  Has not started taking nexium as suggested at prior visit. Symptoms of reflux have resolved without treatment.  Dietary intake has increased and weight has remained stable.   Hypertension This is a chronic problem. The current episode started more than 1 year ago. The problem has been waxing and waning since onset. The problem is uncontrolled. Associated symptoms include sweats. Pertinent negatives include no anxiety, blurred vision, chest pain, palpitations, peripheral edema or shortness of breath. Risk factors for coronary artery disease include diabetes mellitus, dyslipidemia, family history, post-menopausal state and smoking/tobacco exposure. Past treatments include alpha 1 blockers and calcium channel blockers. The current treatment provides moderate improvement. There is no history of angina, kidney disease, CAD/MI, CVA, heart failure or a thyroid problem. There is no history of sleep apnea.  Diabetes She presents for her follow-up diabetic visit. She has type 2 diabetes mellitus. Her disease course has been stable. Hypoglycemia symptoms include dizziness and sweats. Pertinent negatives for hypoglycemia include no nervousness/anxiousness. Pertinent negatives for diabetes include no blurred vision, no chest pain, no fatigue, no foot paresthesias, no foot ulcerations and no weight loss. Symptoms are stable. Pertinent negatives for diabetic complications include no CVA, heart disease, nephropathy or peripheral neuropathy. Risk factors for coronary artery disease include diabetes mellitus, dyslipidemia, family history, hypertension and post-menopausal. Current diabetic treatment includes insulin injections. She is compliant with treatment most of the time. Her weight is stable. When asked about meal planning,  she reported none. Her breakfast blood glucose range is generally 70-90 mg/dl. Her highest blood glucose is 180-200 mg/dl. An ACE inhibitor/angiotensin II receptor blocker is being taken. Eye exam is current.  Hyperlipidemia This is a chronic problem. The current episode started more than 1 year ago. The problem is controlled. Recent lipid tests were reviewed and are normal. Exacerbating diseases include diabetes. She has no history of hypothyroidism. Factors aggravating her hyperlipidemia include smoking. Pertinent negatives include no chest pain, leg pain or shortness of breath. She is currently on no antihyperlipidemic treatment. The current treatment provides significant improvement of lipids. Compliance problems include adherence to diet.  Risk factors for coronary artery disease include diabetes mellitus, dyslipidemia, family history, hypertension and a sedentary lifestyle.  Gastroesophageal Reflux She complains of coughing. She reports no abdominal pain, no belching, no chest pain, no heartburn or no wheezing. This is a chronic problem. The current episode started more than 1 year ago. The problem occurs rarely. The problem has been rapidly improving. The heartburn is located in the LUQ. The heartburn does not wake her from sleep. The heartburn does not limit her activity. The symptoms are aggravated by certain foods and lying down. Pertinent negatives include no fatigue or weight loss. Risk factors include smoking/tobacco exposure. She has tried a PPI (Prilosec ) for the symptoms. The treatment provided mild relief.  COPD PT currently taking Symbicort BID. Pt states she has not been using Spiriva because of cost. Pt states she has cut her smoking to a "pack every three days compared to a pack a day".     Review of Systems  Constitutional: Negative.  Negative for weight loss and fatigue.  HENT: Negative.   Eyes: Negative.  Negative for blurred vision.  Respiratory: Positive for cough. Negative  for shortness of breath and wheezing.  Cardiovascular: Negative.  Negative for chest pain and palpitations.  Gastrointestinal: Negative for heartburn and abdominal pain.  Endocrine: Negative.   Genitourinary: Negative.   Musculoskeletal: Negative.   Neurological: Positive for dizziness. Negative for numbness.  Hematological: Negative.   Psychiatric/Behavioral: Negative.  The patient is not nervous/anxious.   All other systems reviewed and are negative.      Objective:   Physical Exam  Constitutional: She is oriented to person, place, and time. She appears well-developed and well-nourished. No distress.  HENT:  Head: Normocephalic and atraumatic.  Right Ear: External ear normal.  Left Ear: External ear normal.  Mouth/Throat: Oropharynx is clear and moist.  Eyes: Pupils are equal, round, and reactive to light.  Neck: Normal range of motion. Neck supple. No thyromegaly present.  Cardiovascular: Normal rate, regular rhythm, normal heart sounds and intact distal pulses.   No murmur heard. Pulmonary/Chest: Effort normal and breath sounds normal. No respiratory distress. She has no wheezes.  Abdominal: Soft. Bowel sounds are normal. She exhibits no distension. There is no tenderness.  Musculoskeletal: Normal range of motion. She exhibits no edema or tenderness.  Neurological: She is alert and oriented to person, place, and time. She has normal reflexes. No cranial nerve deficit.  Skin: Skin is warm and dry.  Psychiatric: She has a normal mood and affect. Her behavior is normal. Judgment and thought content normal.  Vitals reviewed.     BP 130/80 mmHg  Pulse 82  Temp(Src) 97.7 F (36.5 C) (Oral)  Ht _0  (1.575 m)  Wt 95 lb 6.4 oz (43.273 kg)  BMI 17.44 kg/m2     Assessment & Plan:  1. Coronary artery calcification - CMP14+EGFR  2. Essential hypertension - CMP14+EGFR  3. Moderate COPD (chronic obstructive pulmonary disease) (HCC) -PT started on Incruse today because she  states she can not afford her Spiriva  - CMP14+EGFR - umeclidinium bromide (INCRUSE ELLIPTA) 62.5 MCG/INH AEPB; Inhale 1 puff into the lungs daily.  Dispense: 30 each; Refill: 6  4. Gastroesophageal reflux disease, esophagitis presence not specified - CMP14+EGFR  5. Type 2 diabetes mellitus with hyperglycemia, without long-term current use of insulin (HCC) - CMP14+EGFR - Microalbumin / creatinine urine ratio - aspirin EC 81 MG tablet; Take 1 tablet (81 mg total) by mouth daily.  Dispense: 90 tablet; Refill: 1 - Bayer DCA Hb A1c Waived  6. HLD (hyperlipidemia) - CMP14+EGFR - Lipid panel - aspirin EC 81 MG tablet; Take 1 tablet (81 mg total) by mouth daily.  Dispense: 90 tablet; Refill: 1  7. Vitamin D deficiency - CMP14+EGFR  8. Smoking - CMP14+EGFR - aspirin EC 81 MG tablet; Take 1 tablet (81 mg total) by mouth daily.  Dispense: 90 tablet; Refill: 1  9. Colon cancer screening - Fecal occult blood, imunochemical; Future  10. Encounter for screening for HIV - HIV antibody   Continue all meds Labs pending Health Maintenance reviewed Diet and exercise encouraged RTO 3 months  Evelina Dun, FNP

## 2016-01-31 LAB — CMP14+EGFR
ALBUMIN: 3.7 g/dL (ref 3.6–4.8)
ALK PHOS: 86 IU/L (ref 39–117)
ALT: 10 IU/L (ref 0–32)
AST: 15 IU/L (ref 0–40)
Albumin/Globulin Ratio: 1.4 (ref 1.2–2.2)
BUN/Creatinine Ratio: 16 (ref 12–28)
BUN: 14 mg/dL (ref 8–27)
Bilirubin Total: 0.5 mg/dL (ref 0.0–1.2)
CALCIUM: 9.2 mg/dL (ref 8.7–10.3)
CO2: 20 mmol/L (ref 18–29)
Chloride: 97 mmol/L (ref 96–106)
Creatinine, Ser: 0.86 mg/dL (ref 0.57–1.00)
GFR calc Af Amer: 83 mL/min/{1.73_m2} (ref >59)
GFR, EST NON AFRICAN AMERICAN: 72 mL/min/{1.73_m2} (ref >59)
GLOBULIN, TOTAL: 2.6 g/dL (ref 1.5–4.5)
Glucose: 167 mg/dL — ABNORMAL HIGH (ref 65–99)
Potassium: 4.6 mmol/L (ref 3.5–5.2)
Sodium: 141 mmol/L (ref 134–144)
Total Protein: 6.3 g/dL (ref 6.0–8.5)

## 2016-01-31 LAB — LIPID PANEL
CHOL/HDL RATIO: 3.1 ratio (ref 0.0–4.4)
Cholesterol, Total: 181 mg/dL (ref 100–199)
HDL: 59 mg/dL (ref >39)
LDL CALC: 108 mg/dL — AB (ref 0–99)
TRIGLYCERIDES: 68 mg/dL (ref 0–149)
VLDL CHOLESTEROL CAL: 14 mg/dL (ref 5–40)

## 2016-01-31 LAB — HIV ANTIBODY (ROUTINE TESTING W REFLEX): HIV SCREEN 4TH GENERATION: NONREACTIVE

## 2016-01-31 LAB — MICROALBUMIN / CREATININE URINE RATIO
Creatinine, Urine: 188.9 mg/dL
MICROALB/CREAT RATIO: 9.3 mg/g creat (ref 0.0–30.0)
Microalbumin, Urine: 17.6 ug/mL

## 2016-01-31 LAB — BAYER DCA HB A1C WAIVED

## 2016-02-01 ENCOUNTER — Other Ambulatory Visit: Payer: Self-pay | Admitting: Family

## 2016-02-01 MED ORDER — DULAGLUTIDE 0.75 MG/0.5ML ~~LOC~~ SOAJ: 0.7500 mg | SUBCUTANEOUS | Status: DC

## 2016-02-12 SURGERY — LAPAROTOMY, EXPLORATORY
Anesthesia: General

## 2016-05-02 ENCOUNTER — Ambulatory Visit (INDEPENDENT_AMBULATORY_CARE_PROVIDER_SITE_OTHER): Payer: Commercial Managed Care - HMO | Admitting: Family

## 2016-05-02 ENCOUNTER — Encounter: Payer: Self-pay | Admitting: Family

## 2016-05-02 VITALS — BP 131/88 | HR 86 | Temp 97.5°F | Ht 62.0 in | Wt 92.2 lb

## 2016-05-02 DIAGNOSIS — R636 Underweight: Secondary | ICD-10-CM | POA: Diagnosis not present

## 2016-05-02 DIAGNOSIS — F17200 Nicotine dependence, unspecified, uncomplicated: Secondary | ICD-10-CM

## 2016-05-02 DIAGNOSIS — Z1239 Encounter for other screening for malignant neoplasm of breast: Secondary | ICD-10-CM

## 2016-05-02 DIAGNOSIS — E559 Vitamin D deficiency, unspecified: Secondary | ICD-10-CM

## 2016-05-02 DIAGNOSIS — E785 Hyperlipidemia, unspecified: Secondary | ICD-10-CM

## 2016-05-02 DIAGNOSIS — I10 Essential (primary) hypertension: Secondary | ICD-10-CM | POA: Diagnosis not present

## 2016-05-02 DIAGNOSIS — E1165 Type 2 diabetes mellitus with hyperglycemia: Secondary | ICD-10-CM | POA: Diagnosis not present

## 2016-05-02 DIAGNOSIS — I2584 Coronary atherosclerosis due to calcified coronary lesion: Secondary | ICD-10-CM

## 2016-05-02 DIAGNOSIS — I2510 Atherosclerotic heart disease of native coronary artery without angina pectoris: Secondary | ICD-10-CM | POA: Diagnosis not present

## 2016-05-02 DIAGNOSIS — H9193 Unspecified hearing loss, bilateral: Secondary | ICD-10-CM

## 2016-05-02 DIAGNOSIS — J449 Chronic obstructive pulmonary disease, unspecified: Secondary | ICD-10-CM

## 2016-05-02 DIAGNOSIS — K219 Gastro-esophageal reflux disease without esophagitis: Secondary | ICD-10-CM

## 2016-05-02 DIAGNOSIS — Z1211 Encounter for screening for malignant neoplasm of colon: Secondary | ICD-10-CM

## 2016-05-02 DIAGNOSIS — Z780 Asymptomatic menopausal state: Secondary | ICD-10-CM

## 2016-05-02 DIAGNOSIS — Z720 Tobacco use: Secondary | ICD-10-CM | POA: Diagnosis not present

## 2016-05-02 LAB — BAYER DCA HB A1C WAIVED: HB A1C: 6.9 % (ref <7.0)

## 2016-05-02 NOTE — Progress Notes (Addendum)
Subjective:    Patient ID: Jamie [name], female    DOB: Mar 11, 1951, 65 y.o.   MRN: [id number]  Pt presents to the office today for chronic follow up. PT complaining of hypoglycemia episodes  since starting Trulicity.  Hypertension This is a chronic problem. The current episode started more than 1 year ago. The problem has been resolved since onset. The problem is controlled. Associated symptoms include shortness of breath and sweats. Pertinent negatives include no anxiety, blurred vision, chest pain, palpitations or peripheral edema. Risk factors for coronary artery disease include diabetes mellitus, dyslipidemia, family history, post-menopausal state and smoking/tobacco exposure. Past treatments include alpha 1 blockers and calcium channel blockers. The current treatment provides moderate improvement. There is no history of angina, kidney disease, CAD/MI, CVA, heart failure or a thyroid problem. There is no history of sleep apnea.  Diabetes She presents for her follow-up diabetic visit. She has type 2 diabetes mellitus. Her disease course has been stable. Hypoglycemia symptoms include dizziness, sleepiness and sweats. Pertinent negatives for hypoglycemia include no nervousness/anxiousness. Pertinent negatives for diabetes include no blurred vision, no chest pain, no fatigue, no foot paresthesias, no foot ulcerations and no weight loss. Symptoms are stable. Pertinent negatives for diabetic complications include no CVA, heart disease, nephropathy or peripheral neuropathy. Risk factors for coronary artery disease include diabetes mellitus, dyslipidemia, family history, hypertension and post-menopausal. Current diabetic treatment includes insulin injections and oral agent (dual therapy). She is compliant with treatment most of the time. Her weight is stable. When asked about meal planning, she reported none. Her breakfast blood glucose range is generally 110-130 mg/dl. Her highest blood glucose is 180-200  mg/dl. An ACE inhibitor/angiotensin II receptor blocker is being taken. Eye exam is current.  Hyperlipidemia This is a chronic problem. The current episode started more than 1 year ago. The problem is uncontrolled. Recent lipid tests were reviewed and are high. Exacerbating diseases include diabetes. She has no history of hypothyroidism. Factors aggravating her hyperlipidemia include smoking. Associated symptoms include shortness of breath. Pertinent negatives include no chest pain or leg pain. Current antihyperlipidemic treatment includes statins and diet change. The current treatment provides mild improvement of lipids. Compliance problems include adherence to diet.  Risk factors for coronary artery disease include diabetes mellitus, dyslipidemia, family history, hypertension and a sedentary lifestyle.  Gastroesophageal Reflux She complains of coughing. She reports no abdominal pain, no belching, no chest pain, no heartburn or no wheezing. This is a chronic problem. The current episode started more than 1 year ago. The problem occurs rarely. The problem has been resolved. The heartburn is located in the LUQ. The heartburn does not wake her from sleep. The heartburn does not limit her activity. The symptoms are aggravated by certain foods and lying down. Pertinent negatives include no fatigue or weight loss. Risk factors include smoking/tobacco exposure. She has tried a PPI (Prilosec ) for the symptoms. The treatment provided mild relief.  COPD PT currently taking Symbicort BID. Pt states she has not been using Spiriva because of cost. Pt states she has cut her smoking to a "pack every two days compared to a pack a day".     Review of Systems  Constitutional: Negative.  Negative for weight loss and fatigue.  HENT: Negative.   Eyes: Negative.  Negative for blurred vision.  Respiratory: Positive for cough and shortness of breath. Negative for wheezing.   Cardiovascular: Negative.  Negative for chest  pain and palpitations.  Gastrointestinal: Negative for heartburn and abdominal pain.  Endocrine: Negative.   Genitourinary: Negative.   Musculoskeletal: Negative.   Neurological: Positive for dizziness. Negative for numbness.  Hematological: Negative.   Psychiatric/Behavioral: Negative.  The patient is not nervous/anxious.   All other systems reviewed and are negative.      Objective:   Physical Exam  Constitutional: She is oriented to person, place, and time. She appears well-developed and well-nourished. No distress.  HENT:  Head: Normocephalic and atraumatic.  Right Ear: External ear normal.  Left Ear: External ear normal.  Nose: Nose normal.  Mouth/Throat: Oropharynx is clear and moist.  Eyes: Pupils are equal, round, and reactive to light.  Neck: Normal range of motion. Neck supple. No thyromegaly present.  Cardiovascular: Normal rate, regular rhythm, normal heart sounds and intact distal pulses.   No murmur heard. Pulmonary/Chest: Effort normal. No respiratory distress. She has wheezes.  Abdominal: Soft. Bowel sounds are normal. She exhibits no distension. There is no tenderness.  Musculoskeletal: Normal range of motion. She exhibits no edema or tenderness.  Neurological: She is alert and oriented to person, place, and time. She has normal reflexes. No cranial nerve deficit.  Skin: Skin is warm and dry.  Psychiatric: She has a normal mood and affect. Her behavior is normal. Judgment and thought content normal.  Vitals reviewed.     BP 131/88 mmHg  Pulse 86  Temp(Src) 97.5 F (36.4 C) (Oral)  Ht 5' 2" (1.575 m)  Wt 92 lb 3.2 oz (41.822 kg)  BMI 16.86 kg/m2     Assessment & Plan:  1. Coronary artery calcification - CMP14+EGFR  2. Moderate COPD (chronic obstructive pulmonary disease) (HCC) - CMP14+EGFR  3. Essential hypertension - CMP14+EGFR  4. Gastroesophageal reflux disease, esophagitis presence not specified - CMP14+EGFR  5. Type 2 diabetes mellitus  with hyperglycemia, without long-term current use of insulin (HCC) - Bayer DCA Hb A1c Waived - CMP14+EGFR  6. Vitamin D deficiency - CMP14+EGFR  7. Smoking - CMP14+EGFR  8. HLD (hyperlipidemia) - CMP14+EGFR - Lipid panel  9. Hard of hearing, bilateral - CMP14+EGFR  10. Underweight - CMP14+EGFR  11. Breast cancer screening - [facility] MAMMOGRAPHY - CMP14+EGFR  12. Post-menopausal - CMP14+EGFR - DG [facility] DEXA  13. Colon cancer screening - Fecal occult blood, imunochemical; Future  *Pt told to ]ddecrease insulin to 20 Units in am and 5 units in evening Continue all meds Labs pending Health Maintenance reviewed Diet and exercise encouraged RTO 3 months  Evelina Dun, FNP

## 2016-05-02 NOTE — Addendum Note (Signed)
Addended by: Evelina Dun A on: 05/02/2016 04:39 PM   Modules accepted: SmartSet

## 2016-05-02 NOTE — Patient Instructions (Signed)
Diabetes Mellitus and Food It is important for you to manage your blood sugar (glucose) level. Your blood glucose level can be greatly affected by what you eat. Eating healthier foods in the appropriate amounts throughout the day at about the same time each day will help you control your blood glucose level. It can also help slow or prevent worsening of your diabetes mellitus. Healthy eating may even help you improve the level of your blood pressure and reach or maintain a healthy weight.  General recommendations for healthful eating and cooking habits include:  Eating meals and snacks regularly. Avoid going long periods of time without eating to lose weight.  Eating a diet that consists mainly of plant-based foods, such as fruits, vegetables, nuts, legumes, and whole grains.  Using low-heat cooking methods, such as baking, instead of high-heat cooking methods, such as deep frying. Work with your dietitian to make sure you understand how to use the Nutrition Facts information on food labels. HOW CAN FOOD AFFECT ME? Carbohydrates Carbohydrates affect your blood glucose level more than any other type of food. Your dietitian will help you determine how many carbohydrates to eat at each meal and teach you how to count carbohydrates. Counting carbohydrates is important to keep your blood glucose at a healthy level, especially if you are using insulin or taking certain medicines for diabetes mellitus. Alcohol Alcohol can cause sudden decreases in blood glucose (hypoglycemia), especially if you use insulin or take certain medicines for diabetes mellitus. Hypoglycemia can be a life-threatening condition. Symptoms of hypoglycemia (sleepiness, dizziness, and disorientation) are similar to symptoms of having too much alcohol.  If your health care provider has given you approval to drink alcohol, do so in moderation and use the following guidelines:  Women should not have more than one drink per day, and men  should not have more than two drinks per day. One drink is equal to:  12 oz of beer.  5 oz of wine.  1 oz of hard liquor.  Do not drink on an empty stomach.  Keep yourself hydrated. Have water, diet soda, or unsweetened iced tea.  Regular soda, juice, and other mixers might contain a lot of carbohydrates and should be counted. WHAT FOODS ARE NOT RECOMMENDED? As you make food choices, it is important to remember that all foods are not the same. Some foods have fewer nutrients per serving than other foods, even though they might have the same number of calories or carbohydrates. It is difficult to get your body what it needs when you eat foods with fewer nutrients. Examples of foods that you should avoid that are high in calories and carbohydrates but low in nutrients include:  Trans fats (most processed foods list trans fats on the Nutrition Facts label).  Regular soda.  Juice.  Candy.  Sweets, such as cake, pie, doughnuts, and cookies.  Fried foods. WHAT FOODS CAN I EAT? Eat nutrient-rich foods, which will nourish your body and keep you healthy. The food you should eat also will depend on several factors, including:  The calories you need.  The medicines you take.  Your weight.  Your blood glucose level.  Your blood pressure level.  Your cholesterol level. You should eat a variety of foods, including:  Protein.  Lean cuts of meat.  Proteins low in saturated fats, such as fish, egg whites, and beans. Avoid processed meats.  Fruits and vegetables.  Fruits and vegetables that may help control blood glucose levels, such as apples, mangoes, and   yams.  Dairy products.  Choose fat-free or low-fat dairy products, such as milk, yogurt, and cheese.  Grains, bread, pasta, and rice.  Choose whole grain products, such as multigrain bread, whole oats, and brown rice. These foods may help control blood pressure.  Fats.  Foods containing healthful fats, such as nuts,  avocado, olive oil, canola oil, and fish. DOES EVERYONE WITH DIABETES MELLITUS HAVE THE SAME MEAL PLAN? Because every person with diabetes mellitus is different, there is not one meal plan that works for everyone. It is very important that you meet with a dietitian who will help you create a meal plan that is just right for you.   This information is not intended to replace advice given to you by your health care provider. Make sure you discuss any questions you have with your health care provider.   Document Released: 06/27/2005 Document Revised: 10/21/2014 Document Reviewed: 08/27/2013 Elsevier Interactive Patient Education 2016 [employer]  

## 2016-05-03 LAB — LIPID PANEL
CHOLESTEROL TOTAL: 180 mg/dL (ref 100–199)
Chol/HDL Ratio: 2.8 ratio units (ref 0.0–4.4)
HDL: 64 mg/dL (ref >39)
LDL Calculated: 102 mg/dL — ABNORMAL HIGH (ref 0–99)
TRIGLYCERIDES: 71 mg/dL (ref 0–149)
VLDL CHOLESTEROL CAL: 14 mg/dL (ref 5–40)

## 2016-05-03 LAB — CMP14+EGFR
A/G RATIO: 1.5 (ref 1.2–2.2)
ALBUMIN: 3.8 g/dL (ref 3.6–4.8)
ALK PHOS: 92 IU/L (ref 39–117)
ALT: 10 IU/L (ref 0–32)
AST: 14 IU/L (ref 0–40)
BILIRUBIN TOTAL: 0.4 mg/dL (ref 0.0–1.2)
BUN / CREAT RATIO: 15 (ref 12–28)
BUN: 15 mg/dL (ref 8–27)
CHLORIDE: 99 mmol/L (ref 96–106)
CO2: 23 mmol/L (ref 18–29)
Calcium: 9.4 mg/dL (ref 8.7–10.3)
Creatinine, Ser: 1 mg/dL (ref 0.57–1.00)
GFR calc Af Amer: 69 mL/min/{1.73_m2} (ref >59)
GFR calc non Af Amer: 60 mL/min/{1.73_m2} (ref >59)
GLUCOSE: 130 mg/dL — AB (ref 65–99)
Globulin, Total: 2.6 g/dL (ref 1.5–4.5)
POTASSIUM: 4.5 mmol/L (ref 3.5–5.2)
Sodium: 137 mmol/L (ref 134–144)
Total Protein: 6.4 g/dL (ref 6.0–8.5)

## 2016-05-31 ENCOUNTER — Telehealth: Payer: Self-pay | Admitting: Family

## 2016-05-31 DIAGNOSIS — G8929 Other chronic pain: Secondary | ICD-10-CM

## 2016-05-31 DIAGNOSIS — M549 Dorsalgia, unspecified: Principal | ICD-10-CM

## 2016-06-01 NOTE — Telephone Encounter (Signed)
Please advise 

## 2016-06-03 NOTE — Telephone Encounter (Signed)
I have placed referral. If patient has been seen by them she can call and try to make appt

## 2016-06-03 NOTE — Telephone Encounter (Signed)
Patient aware.

## 2016-06-13 DIAGNOSIS — M5126 Other intervertebral disc displacement, lumbar region: Secondary | ICD-10-CM | POA: Diagnosis not present

## 2016-06-13 DIAGNOSIS — M5416 Radiculopathy, lumbar region: Secondary | ICD-10-CM | POA: Diagnosis not present

## 2016-07-08 ENCOUNTER — Telehealth: Payer: Self-pay | Admitting: Family

## 2016-07-08 NOTE — Telephone Encounter (Signed)
Patient is calling about her insulin but is going to call [facility]

## 2016-07-15 ENCOUNTER — Ambulatory Visit (INDEPENDENT_AMBULATORY_CARE_PROVIDER_SITE_OTHER): Payer: Commercial Managed Care - HMO

## 2016-07-15 DIAGNOSIS — Z23 Encounter for immunization: Secondary | ICD-10-CM | POA: Diagnosis not present

## 2016-08-13 ENCOUNTER — Other Ambulatory Visit: Payer: Self-pay | Admitting: Family

## 2016-08-13 DIAGNOSIS — E1165 Type 2 diabetes mellitus with hyperglycemia: Secondary | ICD-10-CM

## 2016-09-24 ENCOUNTER — Ambulatory Visit (INDEPENDENT_AMBULATORY_CARE_PROVIDER_SITE_OTHER): Payer: Commercial Managed Care - HMO

## 2016-09-24 ENCOUNTER — Ambulatory Visit (INDEPENDENT_AMBULATORY_CARE_PROVIDER_SITE_OTHER): Payer: Commercial Managed Care - HMO | Admitting: Family

## 2016-09-24 ENCOUNTER — Encounter: Payer: Self-pay | Admitting: Family

## 2016-09-24 VITALS — BP 159/89 | HR 77 | Temp 98.7°F | Ht 62.0 in | Wt 88.0 lb

## 2016-09-24 DIAGNOSIS — E785 Hyperlipidemia, unspecified: Secondary | ICD-10-CM | POA: Diagnosis not present

## 2016-09-24 DIAGNOSIS — I2510 Atherosclerotic heart disease of native coronary artery without angina pectoris: Secondary | ICD-10-CM

## 2016-09-24 DIAGNOSIS — H9193 Unspecified hearing loss, bilateral: Secondary | ICD-10-CM

## 2016-09-24 DIAGNOSIS — Z780 Asymptomatic menopausal state: Secondary | ICD-10-CM | POA: Diagnosis not present

## 2016-09-24 DIAGNOSIS — J449 Chronic obstructive pulmonary disease, unspecified: Secondary | ICD-10-CM | POA: Diagnosis not present

## 2016-09-24 DIAGNOSIS — F17200 Nicotine dependence, unspecified, uncomplicated: Secondary | ICD-10-CM

## 2016-09-24 DIAGNOSIS — I10 Essential (primary) hypertension: Secondary | ICD-10-CM

## 2016-09-24 DIAGNOSIS — Z1211 Encounter for screening for malignant neoplasm of colon: Secondary | ICD-10-CM | POA: Diagnosis not present

## 2016-09-24 DIAGNOSIS — K219 Gastro-esophageal reflux disease without esophagitis: Secondary | ICD-10-CM | POA: Diagnosis not present

## 2016-09-24 DIAGNOSIS — E1165 Type 2 diabetes mellitus with hyperglycemia: Secondary | ICD-10-CM | POA: Diagnosis not present

## 2016-09-24 DIAGNOSIS — R636 Underweight: Secondary | ICD-10-CM

## 2016-09-24 DIAGNOSIS — I2584 Coronary atherosclerosis due to calcified coronary lesion: Secondary | ICD-10-CM

## 2016-09-24 DIAGNOSIS — E559 Vitamin D deficiency, unspecified: Secondary | ICD-10-CM

## 2016-09-24 LAB — CMP14+EGFR
ALK PHOS: 80 IU/L (ref 39–117)
ALT: 15 IU/L (ref 0–32)
AST: 15 IU/L (ref 0–40)
Albumin/Globulin Ratio: 1.6 (ref 1.2–2.2)
Albumin: 3.7 g/dL (ref 3.6–4.8)
BUN/Creatinine Ratio: 16 (ref 12–28)
BUN: 15 mg/dL (ref 8–27)
Bilirubin Total: 0.6 mg/dL (ref 0.0–1.2)
CO2: 23 mmol/L (ref 18–29)
CREATININE: 0.95 mg/dL (ref 0.57–1.00)
Calcium: 9.4 mg/dL (ref 8.7–10.3)
Chloride: 99 mmol/L (ref 96–106)
GFR calc Af Amer: 73 mL/min/{1.73_m2} (ref >59)
GFR calc non Af Amer: 63 mL/min/{1.73_m2} (ref >59)
GLUCOSE: 142 mg/dL — AB (ref 65–99)
Globulin, Total: 2.3 g/dL (ref 1.5–4.5)
Potassium: 4.5 mmol/L (ref 3.5–5.2)
Sodium: 139 mmol/L (ref 134–144)
Total Protein: 6 g/dL (ref 6.0–8.5)

## 2016-09-24 LAB — LIPID PANEL
CHOLESTEROL TOTAL: 206 mg/dL — AB (ref 100–199)
Chol/HDL Ratio: 3 ratio units (ref 0.0–4.4)
HDL: 68 mg/dL (ref >39)
LDL CALC: 121 mg/dL — AB (ref 0–99)
TRIGLYCERIDES: 86 mg/dL (ref 0–149)
VLDL CHOLESTEROL CAL: 17 mg/dL (ref 5–40)

## 2016-09-24 LAB — BAYER DCA HB A1C WAIVED: HB A1C (BAYER DCA - WAIVED): 6.6 % (ref <7.0)

## 2016-09-24 MED ORDER — ESOMEPRAZOLE MAGNESIUM 20 MG PO CPDR: 20.0000 mg | DELAYED_RELEASE_CAPSULE | Freq: Every day | ORAL | 1 refills | Status: DC

## 2016-09-24 NOTE — Progress Notes (Signed)
Subjective:    Patient ID: Jamie [name], female    DOB: 06-03-1951, 65 y.o.   MRN: [id number]  Pt presents to the office today for chronic follow up. PT states she is having a hard time affording her diabetic medications. Hypertension  This is a chronic problem. The current episode started more than 1 year ago. The problem has been waxing and waning since onset. The problem is uncontrolled. Pertinent negatives include no anxiety, blurred vision, chest pain, palpitations, peripheral edema, shortness of breath or sweats. Risk factors for coronary artery disease include diabetes mellitus, dyslipidemia, family history, post-menopausal state and smoking/tobacco exposure. Past treatments include alpha 1 blockers and calcium channel blockers. The current treatment provides moderate improvement. There is no history of angina, kidney disease, CAD/MI, CVA, heart failure or a thyroid problem. There is no history of sleep apnea.  Diabetes  She presents for her follow-up diabetic visit. She has type 2 diabetes mellitus. Her disease course has been stable. Pertinent negatives for hypoglycemia include no nervousness/anxiousness or sweats. Pertinent negatives for diabetes include no blurred vision, no chest pain, no fatigue, no foot paresthesias, no foot ulcerations and no weight loss. Hypoglycemia complications include nocturnal hypoglycemia. Symptoms are stable. Pertinent negatives for diabetic complications include no CVA, heart disease, nephropathy or peripheral neuropathy. Risk factors for coronary artery disease include diabetes mellitus, dyslipidemia, family history, hypertension and post-menopausal. Current diabetic treatment includes insulin injections and oral agent (dual therapy). She is compliant with treatment most of the time. Her weight is stable. When asked about meal planning, she reported none. Her breakfast blood glucose range is generally 110-130 mg/dl. Her highest blood glucose is 180-200 mg/dl. An  ACE inhibitor/angiotensin II receptor blocker is being taken. Eye exam is current.  Hyperlipidemia  This is a chronic problem. The current episode started more than 1 year ago. The problem is uncontrolled. Recent lipid tests were reviewed and are high. Exacerbating diseases include diabetes. She has no history of hypothyroidism. Factors aggravating her hyperlipidemia include smoking. Pertinent negatives include no chest pain, leg pain or shortness of breath. Current antihyperlipidemic treatment includes statins and diet change. The current treatment provides mild improvement of lipids. Compliance problems include adherence to diet.  Risk factors for coronary artery disease include diabetes mellitus, dyslipidemia, family history, hypertension and a sedentary lifestyle.  Gastroesophageal Reflux  She complains of coughing. She reports no abdominal pain, no belching, no chest pain, no heartburn or no wheezing. This is a chronic problem. The current episode started more than 1 year ago. The problem occurs rarely. The problem has been resolved. The heartburn is located in the LUQ. The heartburn does not wake her from sleep. The heartburn does not limit her activity. The symptoms are aggravated by certain foods and lying down. Pertinent negatives include no fatigue or weight loss. Risk factors include smoking/tobacco exposure. She has tried a PPI (Prilosec ) for the symptoms. The treatment provided mild relief.  COPD PT currently taking Symbicort as needed.Pt states she has cut her smoking to a "pack every two days compared to a pack a day".     Review of Systems  Constitutional: Negative.  Negative for fatigue and weight loss.  HENT: Negative.   Eyes: Negative.  Negative for blurred vision.  Respiratory: Positive for cough. Negative for shortness of breath and wheezing.   Cardiovascular: Negative.  Negative for chest pain and palpitations.  Gastrointestinal: Negative for abdominal pain and heartburn.    Endocrine: Negative.   Genitourinary: Negative.  Musculoskeletal: Negative.   Neurological: Negative for numbness.  Hematological: Negative.   Psychiatric/Behavioral: Negative.  The patient is not nervous/anxious.   All other systems reviewed and are negative.      Objective:   Physical Exam  Constitutional: She is oriented to person, place, and time. She appears well-developed and well-nourished. No distress.  HENT:  Head: Normocephalic and atraumatic.  Right Ear: External ear normal.  Left Ear: External ear normal.  Nose: Nose normal.  Mouth/Throat: Oropharynx is clear and moist.  Hard of hearing   Eyes: Pupils are equal, round, and reactive to light.  Neck: Normal range of motion. Neck supple. No thyromegaly present.  Cardiovascular: Normal rate, regular rhythm, normal heart sounds and intact distal pulses.   No murmur heard. Pulmonary/Chest: Effort normal. No respiratory distress. She has wheezes.  Abdominal: Soft. Bowel sounds are normal. She exhibits no distension. There is no tenderness.  Musculoskeletal: Normal range of motion. She exhibits no edema or tenderness.  Neurological: She is alert and oriented to person, place, and time. She has normal reflexes. No cranial nerve deficit.  Skin: Skin is warm and dry.  Psychiatric: She has a normal mood and affect. Her behavior is normal. Judgment and thought content normal.  Vitals reviewed.     BP (!) 169/99   Pulse 78   Temp 98.7 F (37.1 C) (Oral)   Ht '5\' 2"'  (1.575 m)   Wt 88 lb (39.9 kg)   BMI 16.10 kg/m      Assessment & Plan:  , 1. Coronary artery calcification - CMP14+EGFR  2. Essential hypertension - CMP14+EGFR  3. Moderate COPD (chronic obstructive pulmonary disease) (HCC) - CMP14+EGFR  4. Gastroesophageal reflux disease, esophagitis presence not specified - esomeprazole (NEXIUM) 20 MG capsule; Take 1 capsule (20 mg total) by mouth daily at 12 noon.  Dispense: 90 capsule; Refill: 1 -  CMP14+EGFR  5. Type 2 diabetes mellitus with hyperglycemia, without long-term current use of insulin (HCC)  - Bayer DCA Hb A1c Waived - CMP14+EGFR  6. Bilateral hearing loss, unspecified hearing loss type - CMP14+EGFR  7. Vitamin D deficiency  - CMP14+EGFR  8. Underweight - CMP14+EGFR  9. Smoking  - CMP14+EGFR  10. Hyperlipidemia, unspecified hyperlipidemia type - CMP14+EGFR - Lipid panel  11. Colon cancer screening - CMP14+EGFR - Fecal occult blood, imunochemical; Future  12. Post-menopausal - CMP14+EGFR - DG [facility] DEXA   Continue all meds Labs pending Health Maintenance reviewed Diet and exercise encouraged RTO 3 months  Evelina Dun, FNP

## 2016-09-24 NOTE — Patient Instructions (Signed)

## 2016-09-25 ENCOUNTER — Other Ambulatory Visit: Payer: Self-pay | Admitting: Family

## 2016-09-25 MED ORDER — PRAVASTATIN SODIUM 20 MG PO TABS: 20.0000 mg | ORAL_TABLET | Freq: Every day | ORAL | 1 refills | Status: DC

## 2016-09-26 ENCOUNTER — Telehealth: Payer: Self-pay | Admitting: *Deleted

## 2016-09-26 MED ORDER — OMEPRAZOLE 20 MG PO CPDR: 20.0000 mg | DELAYED_RELEASE_CAPSULE | Freq: Every day | ORAL | 3 refills | Status: DC

## 2016-09-26 NOTE — Telephone Encounter (Signed)
Insurance will not cover nexium 20mg  but will cover omeprazole can we please change. If approved please send to [facility]

## 2016-09-26 NOTE — Telephone Encounter (Signed)
Prescription sent to pharmacy.

## 2016-10-03 ENCOUNTER — Ambulatory Visit (INDEPENDENT_AMBULATORY_CARE_PROVIDER_SITE_OTHER): Payer: Commercial Managed Care - HMO | Admitting: Family

## 2016-10-03 ENCOUNTER — Other Ambulatory Visit: Payer: Commercial Managed Care - HMO

## 2016-10-03 ENCOUNTER — Encounter: Payer: Self-pay | Admitting: Family

## 2016-10-03 VITALS — BP 151/82 | HR 80 | Temp 98.1°F | Ht 62.0 in | Wt 89.2 lb

## 2016-10-03 DIAGNOSIS — I10 Essential (primary) hypertension: Secondary | ICD-10-CM | POA: Diagnosis not present

## 2016-10-03 DIAGNOSIS — Z01419 Encounter for gynecological examination (general) (routine) without abnormal findings: Secondary | ICD-10-CM | POA: Diagnosis not present

## 2016-10-03 DIAGNOSIS — E1165 Type 2 diabetes mellitus with hyperglycemia: Secondary | ICD-10-CM | POA: Diagnosis not present

## 2016-10-03 DIAGNOSIS — M5441 Lumbago with sciatica, right side: Secondary | ICD-10-CM

## 2016-10-03 DIAGNOSIS — K219 Gastro-esophageal reflux disease without esophagitis: Secondary | ICD-10-CM | POA: Diagnosis not present

## 2016-10-03 DIAGNOSIS — J449 Chronic obstructive pulmonary disease, unspecified: Secondary | ICD-10-CM | POA: Diagnosis not present

## 2016-10-03 DIAGNOSIS — G8929 Other chronic pain: Secondary | ICD-10-CM | POA: Diagnosis not present

## 2016-10-03 DIAGNOSIS — E118 Type 2 diabetes mellitus with unspecified complications: Secondary | ICD-10-CM

## 2016-10-03 DIAGNOSIS — Z1211 Encounter for screening for malignant neoplasm of colon: Secondary | ICD-10-CM | POA: Diagnosis not present

## 2016-10-03 DIAGNOSIS — E785 Hyperlipidemia, unspecified: Secondary | ICD-10-CM | POA: Diagnosis not present

## 2016-10-03 DIAGNOSIS — M549 Dorsalgia, unspecified: Secondary | ICD-10-CM

## 2016-10-03 MED ORDER — INSULIN NPH ISOPHANE & REGULAR (70-30) 100 UNIT/ML ~~LOC~~ SUSP: 30.0000 [IU] | Freq: Every day | SUBCUTANEOUS | 11 refills | Status: DC

## 2016-10-03 MED ORDER — DULAGLUTIDE 0.75 MG/0.5ML ~~LOC~~ SOAJ: 0.7500 mg | SUBCUTANEOUS | 1 refills | Status: DC

## 2016-10-03 MED ORDER — TRAMADOL HCL 50 MG PO TABS: 50.0000 mg | ORAL_TABLET | Freq: Two times a day (BID) | ORAL | 3 refills | Status: DC | PRN

## 2016-10-03 NOTE — Progress Notes (Signed)
Subjective:    Patient ID: Jamie [name], female    DOB: May 09, 1951, 64 y.o.   MRN: [id number]  PT presents to the office today for pap. Pt was seen on 09/24/16 and had a chronic follow up and lab work. PT states her GERD is improved since starting her omeprazole. She was also started on pravastatin for her cholesterol. States she is doing well with this at this time. Pt's BP is elevated today. States she is stressed today.  Gynecologic Exam  The patient's pertinent negatives include no genital lesions, genital odor, missed menses or vaginal discharge. This is a chronic problem. The current episode started more than 1 year ago. Associated symptoms include back pain.  Back Pain  This is a chronic problem. The problem occurs intermittently. The problem has been waxing and waning since onset. The pain is present in the lumbar spine. The quality of the pain is described as aching. The pain radiates to the right thigh. The pain is at a severity of 10/10. The pain is moderate. The symptoms are aggravated by bending. Risk factors include history of osteoporosis. She has tried home exercises, ice and NSAIDs for the symptoms. The treatment provided mild relief.  COPD PT continues to smoke. States she is using her Symbicort BID.     Review of Systems  Genitourinary: Negative for missed menses and vaginal discharge.  Musculoskeletal: Positive for back pain.  All other systems reviewed and are negative.      Objective:   Physical Exam  Constitutional: She is oriented to person, place, and time. She appears well-developed and well-nourished. No distress.  HENT:  Head: Normocephalic and atraumatic.  Right Ear: External ear normal.  Left Ear: External ear normal.  Nose: Nose normal.  Mouth/Throat: Oropharynx is clear and moist.  Eyes: Pupils are equal, round, and reactive to light.  Neck: Normal range of motion. Neck supple. No thyromegaly present.  Cardiovascular: Normal rate, regular rhythm,  normal heart sounds and intact distal pulses.   No murmur heard. Pulmonary/Chest: Effort normal. No respiratory distress. She has wheezes. Right breast exhibits no inverted nipple, no mass, no nipple discharge, no skin change and no tenderness. Left breast exhibits no inverted nipple, no mass, no nipple discharge, no skin change and no tenderness. Breasts are symmetrical.  Abdominal: Soft. Bowel sounds are normal. She exhibits no distension. There is no tenderness.  Genitourinary: Vagina normal.  Genitourinary Comments: Bimanual exam- no adnexal masses or tenderness, ovaries nonpalpable   Cervix parous and pink- No discharge   Musculoskeletal: Normal range of motion. She exhibits no edema or tenderness.  Neurological: She is alert and oriented to person, place, and time.  Skin: Skin is warm and dry.  Psychiatric: She has a normal mood and affect. Her behavior is normal. Judgment and thought content normal.  Vitals reviewed.   BP (!) 151/82   Pulse 80   Temp 98.1 F (36.7 C) (Oral)   Ht 5\' 2"  (1.575 m)   Wt 89 lb 4 oz (40.5 kg)   BMI 16.32 kg/m        Assessment & Plan:  1. Moderate COPD (chronic obstructive pulmonary disease) ([place])  2. Essential hypertension  3. Hyperlipidemia, unspecified hyperlipidemia type  4. Gastroesophageal reflux disease, esophagitis presence not specified  5. Chronic bilateral low back pain with right-sided sciatica - traMADol (ULTRAM) 50 MG tablet; Take 1-2 tablets (50-100 mg total) by mouth every 12 (twelve) hours as needed.  Dispense: 120 tablet; Refill: 3  6.  Encounter for gynecological examination without abnormal finding - Pap IG (Image Guided)   Continue all meds Labs pending Health Maintenance reviewed Diet and exercise encouraged RTO 3 months   Evelina Dun, FNP

## 2016-10-03 NOTE — Patient Instructions (Signed)
Chronic Back Pain When back pain lasts longer than 3 months, it is called chronic back pain.The cause of your back pain may not be known. Some common causes include:  Wear and tear (degenerative disease) of the bones, ligaments, or disks in your back.  Inflammation and stiffness in your back (arthritis). People who have chronic back pain often go through certain periods in which the pain is more intense (flare-ups). Many people can learn to manage the pain with home care. Follow these instructions at home: Pay attention to any changes in your symptoms. Take these actions to help with your pain: Activity   Avoid bending and activities that make the problem worse.  Do not sit or stand in one place for long periods of time.  Take brief periods of rest throughout the day. This will reduce your pain. Resting in a lying or standing position is usually better than sitting to rest.  When you are resting for longer periods, mix in some mild activity or stretching between periods of rest. This will help to prevent stiffness and pain.  Get regular exercise. Ask your health care provider what activities are safe for you.  Do not lift anything that is heavier than 10 lb (4.5 kg). Always use proper lifting technique, which includes:  Bending your knees.  Keeping the load close to your body.  Avoiding twisting. Managing pain   If directed, apply ice to the painful area. Your health care provider may recommend applying ice during the first 24-48 hours after a flare-up begins.  Put ice in a plastic bag.  Place a towel between your skin and the bag.  Leave the ice on for 20 minutes, 2-3 times per day.  After icing, apply heat to the affected area as often as told by your health care provider. Use the heat source that your health care provider recommends, such as a moist heat pack or a heating pad.  Place a towel between your skin and the heat source.  Leave the heat on for 20-30  minutes.  Remove the heat if your skin turns bright red. This is especially important if you are unable to feel pain, heat, or cold. You may have a greater risk of getting burned.  Try soaking in a warm tub.  Take over-the-counter and prescription medicines only as told by your health care provider.  Keep all follow-up visits as told by your health care provider. This is important. Contact a health care provider if:  You have pain that is not relieved with rest or medicine. Get help right away if:  You have weakness or numbness in one or both of your legs or feet.  You have trouble controlling your bladder or your bowels.  You have nausea or vomiting.  You have pain in your abdomen.  You have shortness of breath or you faint. This information is not intended to replace advice given to you by your health care provider. Make sure you discuss any questions you have with your health care provider. Document Released: 11/07/2004 Document Revised: 02/08/2016 Document Reviewed: 03/20/2015 Elsevier Interactive Patient Education  2017 [employer]  

## 2016-10-03 NOTE — Addendum Note (Signed)
Addended by: Earlene Plater on: 10/03/2016 04:19 PM   Modules accepted: Orders

## 2016-10-04 LAB — FECAL OCCULT BLOOD, IMMUNOCHEMICAL: FECAL OCCULT BLD: NEGATIVE

## 2016-10-05 LAB — PAP IG (IMAGE GUIDED): PAP SMEAR COMMENT: 0

## 2016-10-10 ENCOUNTER — Encounter: Payer: Self-pay | Admitting: *Deleted

## 2016-10-26 ENCOUNTER — Other Ambulatory Visit: Payer: Self-pay | Admitting: Family

## 2016-10-26 DIAGNOSIS — I10 Essential (primary) hypertension: Secondary | ICD-10-CM

## 2016-10-29 ENCOUNTER — Telehealth: Payer: Self-pay

## 2016-10-30 NOTE — Telephone Encounter (Signed)
For what?

## 2016-11-07 NOTE — Telephone Encounter (Signed)
No response from patient in over 10 days and no voice mail to leave a message.

## 2016-11-25 DIAGNOSIS — M5126 Other intervertebral disc displacement, lumbar region: Secondary | ICD-10-CM | POA: Diagnosis not present

## 2016-11-25 DIAGNOSIS — M5416 Radiculopathy, lumbar region: Secondary | ICD-10-CM | POA: Diagnosis not present

## 2016-12-12 DIAGNOSIS — M5416 Radiculopathy, lumbar region: Secondary | ICD-10-CM | POA: Diagnosis not present

## 2016-12-25 ENCOUNTER — Ambulatory Visit: Payer: Commercial Managed Care - HMO | Admitting: Family

## 2016-12-25 DIAGNOSIS — M5126 Other intervertebral disc displacement, lumbar region: Secondary | ICD-10-CM | POA: Diagnosis not present

## 2016-12-25 DIAGNOSIS — M5416 Radiculopathy, lumbar region: Secondary | ICD-10-CM | POA: Diagnosis not present

## 2016-12-25 DIAGNOSIS — M48061 Spinal stenosis, lumbar region without neurogenic claudication: Secondary | ICD-10-CM | POA: Diagnosis not present

## 2016-12-27 ENCOUNTER — Ambulatory Visit: Payer: Commercial Managed Care - HMO | Admitting: Family

## 2016-12-31 ENCOUNTER — Other Ambulatory Visit: Payer: Self-pay | Admitting: Family

## 2016-12-31 DIAGNOSIS — E1165 Type 2 diabetes mellitus with hyperglycemia: Secondary | ICD-10-CM

## 2017-01-08 DIAGNOSIS — I10 Essential (primary) hypertension: Secondary | ICD-10-CM | POA: Diagnosis not present

## 2017-01-08 DIAGNOSIS — Z681 Body mass index (BMI) 19 or less, adult: Secondary | ICD-10-CM | POA: Diagnosis not present

## 2017-01-08 DIAGNOSIS — M5126 Other intervertebral disc displacement, lumbar region: Secondary | ICD-10-CM | POA: Diagnosis not present

## 2017-01-08 DIAGNOSIS — M5416 Radiculopathy, lumbar region: Secondary | ICD-10-CM | POA: Diagnosis not present

## 2017-01-09 ENCOUNTER — Other Ambulatory Visit: Payer: Self-pay | Admitting: Neurological Surgery

## 2017-01-28 ENCOUNTER — Encounter (HOSPITAL_COMMUNITY): Payer: Self-pay

## 2017-01-28 ENCOUNTER — Encounter (HOSPITAL_COMMUNITY)
Admission: RE | Admit: 2017-01-28 | Discharge: 2017-01-28 | Disposition: A | Discharge status: Home / Self Care | Payer: Medicare HMO | Source: Ambulatory Visit | Attending: Neurological Surgery | Admitting: Neurological Surgery

## 2017-01-28 DIAGNOSIS — Z01812 Encounter for preprocedural laboratory examination: Secondary | ICD-10-CM | POA: Diagnosis not present

## 2017-01-28 DIAGNOSIS — I10 Essential (primary) hypertension: Secondary | ICD-10-CM | POA: Insufficient documentation

## 2017-01-28 DIAGNOSIS — Z01810 Encounter for preprocedural cardiovascular examination: Secondary | ICD-10-CM | POA: Insufficient documentation

## 2017-01-28 LAB — SURGICAL PCR SCREEN
MRSA, PCR: NEGATIVE
STAPHYLOCOCCUS AUREUS: NEGATIVE

## 2017-01-28 LAB — CBC
HEMATOCRIT: 42.3 % (ref 36.0–46.0)
HEMOGLOBIN: 13.6 g/dL (ref 12.0–15.0)
MCH: 28.6 pg (ref 26.0–34.0)
MCHC: 32.2 g/dL (ref 30.0–36.0)
MCV: 88.9 fL (ref 78.0–100.0)
Platelets: 199 10*3/uL (ref 150–400)
RBC: 4.76 MIL/uL (ref 3.87–5.11)
RDW: 14.2 % (ref 11.5–15.5)
WBC: 9.5 10*3/uL (ref 4.0–10.5)

## 2017-01-28 LAB — BASIC METABOLIC PANEL
ANION GAP: 6 (ref 5–15)
BUN: 21 mg/dL — AB (ref 6–20)
CALCIUM: 8.9 mg/dL (ref 8.9–10.3)
CO2: 27 mmol/L (ref 22–32)
Chloride: 106 mmol/L (ref 101–111)
Creatinine, Ser: 1.01 mg/dL — ABNORMAL HIGH (ref 0.44–1.00)
GFR calc Af Amer: >60 mL/min (ref >60)
GFR calc non Af Amer: 58 mL/min — ABNORMAL LOW (ref >60)
GLUCOSE: 254 mg/dL — AB (ref 65–99)
Potassium: 4.1 mmol/L (ref 3.5–5.1)
Sodium: 139 mmol/L (ref 135–145)

## 2017-01-28 LAB — GLUCOSE, CAPILLARY: GLUCOSE-CAPILLARY: 245 mg/dL — AB (ref 65–99)

## 2017-01-28 LAB — TYPE AND SCREEN
ABO/RH(D): A POS
Antibody Screen: NEGATIVE

## 2017-01-28 LAB — ABO/RH: ABO/RH(D): A POS

## 2017-01-28 NOTE — Pre-Procedure Instructions (Addendum)
Jamie [name]  01/28/2017      [place] [place] [address] Phone: [phone] Fax: [phone]  [facility] [address] [place] [address] [place]  [address] [place] [address] Phone: [phone] Fax: [facility] [place], [place] [address] Phone: [phone] Fax: [phone]    Your procedure is scheduled on April 23.  Report to [facility] Admitting at 5:30 A.M.  Call this number if you have problems the morning of surgery:  [phone]   Remember:  Do not eat food or drink liquids after midnight.  Take these medicines the morning of surgery with A SIP OF WATER :amLODipine (NORVASC) , omeprazole (PRILOSEC), albuterol, SYMBICORT (bring inhalers with you)   STOP ASPIRIN, HERBAL MEDICATIONS, NSAIDS-aleve, naproxen, ibuprofen 7 days prior to surgery.      How to Manage Your Diabetes Before and After Surgery  Why is it important to control my blood sugar before and after surgery? . Improving blood sugar levels before and after surgery helps healing and can limit problems. . A way of improving blood sugar control is eating a healthy diet by: o  Eating less sugar and carbohydrates o  Increasing activity/exercise o  Talking with your doctor about reaching your blood sugar goals . High blood sugars (greater than 180 mg/dL) can raise your risk of infections and slow your recovery, so you will need to focus on controlling your diabetes during the weeks before surgery. . Make sure that the doctor who takes care of your diabetes knows about your planned surgery including the date and location.  How do I manage my blood sugar before surgery? . Check your blood sugar at least 4 times a day, starting 2 days before surgery, to make sure that the level is not too high or low. o Check your blood sugar  the morning of your surgery when you wake up and every 2 hours until you get to the Short Stay unit. . If your blood sugar is less than 70 mg/dL, you will need to treat for low blood sugar: o Do not take insulin. o Treat a low blood sugar (less than 70 mg/dL) with  cup of clear juice (cranberry or apple), 4 glucose tablets, OR glucose gel. o Recheck blood sugar in 15 minutes after treatment (to make sure it is greater than 70 mg/dL). If your blood sugar is not greater than 70 mg/dL on recheck, call [phone] for further instructions. . Report your blood sugar to the short stay nurse when you get to Short Stay.  . If you are admitted to the hospital after surgery: o Your blood sugar will be checked by the staff and you will probably be given insulin after surgery (instead of oral diabetes medicines) to make sure you have good blood sugar levels. o The goal for blood sugar control after surgery is 80-180 mg/dL.     WHAT DO I DO ABOUT MY DIABETES MEDICATION?   Marland Kitchen Do not take oral diabetes medicines (pills) the morning of surgery.  . THE NIGHT BEFORE SURGERY, take __7_ units of __Novolin 70/30___insulin.       . THE MORNING OF SURGERY do not take insulin  . The day of surgery, do not take other diabetes injectables, including Byetta (exenatide), Bydureon (exenatide ER), Victoza (liraglutide), or Trulicity (dulaglutide).  Marland Kitchen  If your CBG is greater than 220 mg/dL, you may take  of your sliding scale (correction) dose of insulin.  t  Reviewed and Endorsed by [facility] Patient Education Committee, August 2015    Do not wear jewelry, make-up or nail polish.  Do not wear lotions, powders, or perfumes, or deoderant.  Do not shave 48 hours prior to surgery.  Men may shave face and neck.  Do not bring valuables to the hospital.  [facility] is not responsible for any belongings or valuables.  Contacts, dentures or bridgework may not be worn into surgery.  Leave your suitcase in the car.   After surgery it may be brought to your room.  For patients admitted to the hospital, discharge time will be determined by your treatment team.  Patients discharged the day of surgery will not be allowed to drive home.   Name and phone number of your driver:    Special instructions:  Preparing for surgery  Please read over the following fact sheets that you were given. Pain Booklet and Surgical Site Infection Prevention

## 2017-01-29 ENCOUNTER — Other Ambulatory Visit (HOSPITAL_COMMUNITY): Payer: Commercial Managed Care - HMO

## 2017-01-29 LAB — HEMOGLOBIN A1C
HEMOGLOBIN A1C: 7.2 % — AB (ref 4.8–5.6)
MEAN PLASMA GLUCOSE: 160 mg/dL

## 2017-02-02 NOTE — Anesthesia Preprocedure Evaluation (Signed)
Anesthesia Evaluation  Patient identified by MRN, date of birth, ID band Patient awake    Reviewed: Allergy & Precautions, H&P , Patient's Chart, lab work & pertinent test results, reviewed documented beta blocker date and time   Airway Mallampati: II  TM Distance: >3 FB Neck ROM: full    Dental no notable dental hx.    Pulmonary Current Smoker,    Pulmonary exam normal breath sounds clear to auscultation       Cardiovascular hypertension,  Rhythm:regular Rate:Normal     Neuro/Psych    GI/Hepatic   Endo/Other  diabetes  Renal/GU      Musculoskeletal   Abdominal   Peds  Hematology   Anesthesia Other Findings   Reproductive/Obstetrics                             Anesthesia Physical Anesthesia Plan  ASA: II  Anesthesia Plan: General   Post-op Pain Management:    Induction: Intravenous  Airway Management Planned: Oral ETT  Additional Equipment:   Intra-op Plan:   Post-operative Plan: Extubation in OR  Informed Consent: I have reviewed the patients History and Physical, chart, labs and discussed the procedure including the risks, benefits and alternatives for the proposed anesthesia with the patient or authorized representative who has indicated his/her understanding and acceptance.   Dental Advisory Given  Plan Discussed with: CRNA and Surgeon  Anesthesia Plan Comments: (  )        Anesthesia Quick Evaluation

## 2017-02-03 ENCOUNTER — Inpatient Hospital Stay (HOSPITAL_COMMUNITY): Payer: Medicare HMO | Admitting: Emergency Medicine

## 2017-02-03 ENCOUNTER — Inpatient Hospital Stay (HOSPITAL_COMMUNITY)
Admission: RE | Admit: 2017-02-03 | Discharge: 2017-02-05 | DRG: 455 | Disposition: A | Discharge status: Home / Self Care | Payer: Medicare HMO | Source: Ambulatory Visit | Attending: Internal Medicine | Admitting: Internal Medicine

## 2017-02-03 ENCOUNTER — Encounter (HOSPITAL_COMMUNITY)
Admission: RE | Disposition: A | Discharge status: Home / Self Care | Payer: Self-pay | Source: Ambulatory Visit | Attending: Neurological Surgery

## 2017-02-03 ENCOUNTER — Inpatient Hospital Stay (HOSPITAL_COMMUNITY): Payer: Medicare HMO | Admitting: Certified Registered Nurse Anesthetist

## 2017-02-03 ENCOUNTER — Encounter (HOSPITAL_COMMUNITY): Payer: Self-pay | Admitting: *Deleted

## 2017-02-03 ENCOUNTER — Inpatient Hospital Stay (HOSPITAL_COMMUNITY): Payer: Medicare HMO

## 2017-02-03 DIAGNOSIS — R269 Unspecified abnormalities of gait and mobility: Secondary | ICD-10-CM | POA: Diagnosis not present

## 2017-02-03 DIAGNOSIS — Z9111 Patient's noncompliance with dietary regimen: Secondary | ICD-10-CM

## 2017-02-03 DIAGNOSIS — M5116 Intervertebral disc disorders with radiculopathy, lumbar region: Secondary | ICD-10-CM | POA: Diagnosis not present

## 2017-02-03 DIAGNOSIS — Z853 Personal history of malignant neoplasm of breast: Secondary | ICD-10-CM | POA: Diagnosis not present

## 2017-02-03 DIAGNOSIS — M48062 Spinal stenosis, lumbar region with neurogenic claudication: Principal | ICD-10-CM | POA: Diagnosis present

## 2017-02-03 DIAGNOSIS — E11649 Type 2 diabetes mellitus with hypoglycemia without coma: Secondary | ICD-10-CM | POA: Diagnosis not present

## 2017-02-03 DIAGNOSIS — E782 Mixed hyperlipidemia: Secondary | ICD-10-CM | POA: Diagnosis present

## 2017-02-03 DIAGNOSIS — E119 Type 2 diabetes mellitus without complications: Secondary | ICD-10-CM

## 2017-02-03 DIAGNOSIS — Z79899 Other long term (current) drug therapy: Secondary | ICD-10-CM

## 2017-02-03 DIAGNOSIS — E785 Hyperlipidemia, unspecified: Secondary | ICD-10-CM | POA: Diagnosis present

## 2017-02-03 DIAGNOSIS — Z794 Long term (current) use of insulin: Secondary | ICD-10-CM

## 2017-02-03 DIAGNOSIS — E1159 Type 2 diabetes mellitus with other circulatory complications: Secondary | ICD-10-CM

## 2017-02-03 DIAGNOSIS — E1169 Type 2 diabetes mellitus with other specified complication: Secondary | ICD-10-CM | POA: Diagnosis present

## 2017-02-03 DIAGNOSIS — F17210 Nicotine dependence, cigarettes, uncomplicated: Secondary | ICD-10-CM | POA: Diagnosis present

## 2017-02-03 DIAGNOSIS — M47816 Spondylosis without myelopathy or radiculopathy, lumbar region: Secondary | ICD-10-CM | POA: Diagnosis not present

## 2017-02-03 DIAGNOSIS — H9190 Unspecified hearing loss, unspecified ear: Secondary | ICD-10-CM | POA: Diagnosis present

## 2017-02-03 DIAGNOSIS — M5416 Radiculopathy, lumbar region: Secondary | ICD-10-CM | POA: Diagnosis present

## 2017-02-03 DIAGNOSIS — M4726 Other spondylosis with radiculopathy, lumbar region: Secondary | ICD-10-CM | POA: Diagnosis not present

## 2017-02-03 DIAGNOSIS — Z7982 Long term (current) use of aspirin: Secondary | ICD-10-CM | POA: Diagnosis not present

## 2017-02-03 DIAGNOSIS — Z419 Encounter for procedure for purposes other than remedying health state, unspecified: Secondary | ICD-10-CM

## 2017-02-03 DIAGNOSIS — I10 Essential (primary) hypertension: Secondary | ICD-10-CM | POA: Diagnosis present

## 2017-02-03 DIAGNOSIS — M5126 Other intervertebral disc displacement, lumbar region: Secondary | ICD-10-CM | POA: Diagnosis not present

## 2017-02-03 DIAGNOSIS — E118 Type 2 diabetes mellitus with unspecified complications: Secondary | ICD-10-CM

## 2017-02-03 DIAGNOSIS — M4326 Fusion of spine, lumbar region: Secondary | ICD-10-CM | POA: Diagnosis not present

## 2017-02-03 DIAGNOSIS — E559 Vitamin D deficiency, unspecified: Secondary | ICD-10-CM | POA: Diagnosis not present

## 2017-02-03 DIAGNOSIS — E784 Other hyperlipidemia: Secondary | ICD-10-CM

## 2017-02-03 DIAGNOSIS — M79604 Pain in right leg: Secondary | ICD-10-CM | POA: Diagnosis present

## 2017-02-03 DIAGNOSIS — K219 Gastro-esophageal reflux disease without esophagitis: Secondary | ICD-10-CM | POA: Diagnosis not present

## 2017-02-03 DIAGNOSIS — J449 Chronic obstructive pulmonary disease, unspecified: Secondary | ICD-10-CM | POA: Diagnosis present

## 2017-02-03 DIAGNOSIS — F17200 Nicotine dependence, unspecified, uncomplicated: Secondary | ICD-10-CM | POA: Diagnosis present

## 2017-02-03 DIAGNOSIS — M48061 Spinal stenosis, lumbar region without neurogenic claudication: Secondary | ICD-10-CM | POA: Diagnosis not present

## 2017-02-03 DIAGNOSIS — F17201 Nicotine dependence, unspecified, in remission: Secondary | ICD-10-CM | POA: Diagnosis not present

## 2017-02-03 DIAGNOSIS — R911 Solitary pulmonary nodule: Secondary | ICD-10-CM | POA: Diagnosis not present

## 2017-02-03 LAB — COMPREHENSIVE METABOLIC PANEL
ALBUMIN: 3.2 g/dL — AB (ref 3.5–5.0)
ALK PHOS: 54 U/L (ref 38–126)
ALT: 22 U/L (ref 14–54)
AST: 22 U/L (ref 15–41)
Anion gap: 9 (ref 5–15)
BILIRUBIN TOTAL: 0.5 mg/dL (ref 0.3–1.2)
BUN: 16 mg/dL (ref 6–20)
CALCIUM: 8.5 mg/dL — AB (ref 8.9–10.3)
CO2: 24 mmol/L (ref 22–32)
Chloride: 103 mmol/L (ref 101–111)
Creatinine, Ser: 0.87 mg/dL (ref 0.44–1.00)
GFR calc Af Amer: >60 mL/min (ref >60)
GFR calc non Af Amer: >60 mL/min (ref >60)
GLUCOSE: 263 mg/dL — AB (ref 65–99)
Potassium: 4.5 mmol/L (ref 3.5–5.1)
SODIUM: 136 mmol/L (ref 135–145)
TOTAL PROTEIN: 5.1 g/dL — AB (ref 6.5–8.1)

## 2017-02-03 LAB — GLUCOSE, CAPILLARY
GLUCOSE-CAPILLARY: 127 mg/dL — AB (ref 65–99)
GLUCOSE-CAPILLARY: 324 mg/dL — AB (ref 65–99)
Glucose-Capillary: 113 mg/dL — ABNORMAL HIGH (ref 65–99)
Glucose-Capillary: 102 mg/dL — ABNORMAL HIGH (ref 65–99)

## 2017-02-03 LAB — CBC
HEMATOCRIT: 34.4 % — AB (ref 36.0–46.0)
HEMOGLOBIN: 10.9 g/dL — AB (ref 12.0–15.0)
MCH: 28.1 pg (ref 26.0–34.0)
MCHC: 31.7 g/dL (ref 30.0–36.0)
MCV: 88.7 fL (ref 78.0–100.0)
Platelets: 187 10*3/uL (ref 150–400)
RBC: 3.88 MIL/uL (ref 3.87–5.11)
RDW: 14.3 % (ref 11.5–15.5)
WBC: 8.9 10*3/uL (ref 4.0–10.5)

## 2017-02-03 SURGERY — POSTERIOR LUMBAR FUSION 2 LEVEL
Anesthesia: General | Site: Back

## 2017-02-03 MED ORDER — SUGAMMADEX SODIUM 200 MG/2ML IV SOLN
INTRAVENOUS | Status: DC | PRN
  Administered 2017-02-03: 75 mg via INTRAVENOUS

## 2017-02-03 MED ORDER — THROMBIN 5000 UNITS EX SOLR
CUTANEOUS | Status: AC
  Filled 2017-02-03: qty 5000

## 2017-02-03 MED ORDER — CHLORHEXIDINE GLUCONATE CLOTH 2 % EX PADS: 6.0000 | MEDICATED_PAD | Freq: Once | CUTANEOUS | Status: DC

## 2017-02-03 MED ORDER — DOCUSATE SODIUM 100 MG PO CAPS
100.0000 mg | ORAL_CAPSULE | Freq: Two times a day (BID) | ORAL | Status: DC
Start: 2017-02-03 — End: 2017-02-05
  Administered 2017-02-03 – 2017-02-04 (×2): 100 mg via ORAL
  Filled 2017-02-03 (×2): qty 1

## 2017-02-03 MED ORDER — INSULIN ASPART 100 UNIT/ML ~~LOC~~ SOLN
0.0000 [IU] | Freq: Every day | SUBCUTANEOUS | Status: DC
  Administered 2017-02-03: 4 [IU] via SUBCUTANEOUS

## 2017-02-03 MED ORDER — MENTHOL 3 MG MT LOZG: 1.0000 | LOZENGE | OROMUCOSAL | Status: DC | PRN

## 2017-02-03 MED ORDER — EPHEDRINE SULFATE 50 MG/ML IJ SOLN
INTRAMUSCULAR | Status: DC | PRN
  Administered 2017-02-03 (×2): 5 mg via INTRAVENOUS

## 2017-02-03 MED ORDER — LIDOCAINE-EPINEPHRINE 1 %-1:100000 IJ SOLN
INTRAMUSCULAR | Status: DC | PRN
  Administered 2017-02-03: 5 mL

## 2017-02-03 MED ORDER — AMLODIPINE BESYLATE 5 MG PO TABS
5.0000 mg | ORAL_TABLET | Freq: Every day | ORAL | Status: DC
  Filled 2017-02-03 (×2): qty 1

## 2017-02-03 MED ORDER — ONDANSETRON HCL 4 MG PO TABS
4.0000 mg | ORAL_TABLET | Freq: Four times a day (QID) | ORAL | Status: DC | PRN
  Filled 2017-02-03: qty 1

## 2017-02-03 MED ORDER — UMECLIDINIUM BROMIDE 62.5 MCG/INH IN AEPB
1.0000 | INHALATION_SPRAY | Freq: Every day | RESPIRATORY_TRACT | Status: DC
  Filled 2017-02-03: qty 7

## 2017-02-03 MED ORDER — THROMBIN 5000 UNITS EX SOLR
OROMUCOSAL | Status: DC | PRN
  Administered 2017-02-03 (×2): via TOPICAL

## 2017-02-03 MED ORDER — SODIUM CHLORIDE 0.9 % IV SOLN: 250.0000 mL | INTRAVENOUS | Status: DC

## 2017-02-03 MED ORDER — ALUM & MAG HYDROXIDE-SIMETH 200-200-20 MG/5ML PO SUSP: 30.0000 mL | Freq: Four times a day (QID) | ORAL | Status: DC | PRN

## 2017-02-03 MED ORDER — PRAVASTATIN SODIUM 40 MG PO TABS
20.0000 mg | ORAL_TABLET | Freq: Every day | ORAL | Status: DC
  Administered 2017-02-03 – 2017-02-04 (×2): 20 mg via ORAL
  Filled 2017-02-03 (×2): qty 1

## 2017-02-03 MED ORDER — SUCCINYLCHOLINE CHLORIDE 200 MG/10ML IV SOSY
PREFILLED_SYRINGE | INTRAVENOUS | Status: AC
  Filled 2017-02-03: qty 10

## 2017-02-03 MED ORDER — BISACODYL 10 MG RE SUPP: 10.0000 mg | Freq: Every day | RECTAL | Status: DC | PRN

## 2017-02-03 MED ORDER — SODIUM CHLORIDE 0.9% FLUSH: 3.0000 mL | INTRAVENOUS | Status: DC | PRN

## 2017-02-03 MED ORDER — DEXAMETHASONE SODIUM PHOSPHATE 10 MG/ML IJ SOLN
INTRAMUSCULAR | Status: AC
  Filled 2017-02-03: qty 1

## 2017-02-03 MED ORDER — PANTOPRAZOLE SODIUM 40 MG PO TBEC
40.0000 mg | DELAYED_RELEASE_TABLET | Freq: Every day | ORAL | Status: DC
  Administered 2017-02-04: 40 mg via ORAL
  Filled 2017-02-03: qty 1

## 2017-02-03 MED ORDER — 0.9 % SODIUM CHLORIDE (POUR BTL) OPTIME
TOPICAL | Status: DC | PRN
  Administered 2017-02-03: 1000 mL

## 2017-02-03 MED ORDER — ONDANSETRON HCL 4 MG/2ML IJ SOLN
INTRAMUSCULAR | Status: DC | PRN
  Administered 2017-02-03: 4 mg via INTRAVENOUS

## 2017-02-03 MED ORDER — ALBUTEROL SULFATE (2.5 MG/3ML) 0.083% IN NEBU
INHALATION_SOLUTION | RESPIRATORY_TRACT | Status: AC
  Administered 2017-02-03: 2.5 mg via RESPIRATORY_TRACT
  Filled 2017-02-03: qty 3

## 2017-02-03 MED ORDER — LIDOCAINE 2% (20 MG/ML) 5 ML SYRINGE
INTRAMUSCULAR | Status: AC
  Filled 2017-02-03: qty 5

## 2017-02-03 MED ORDER — FENTANYL CITRATE (PF) 250 MCG/5ML IJ SOLN
INTRAMUSCULAR | Status: AC
  Filled 2017-02-03: qty 5

## 2017-02-03 MED ORDER — SODIUM CHLORIDE 0.9% FLUSH
3.0000 mL | Freq: Two times a day (BID) | INTRAVENOUS | Status: DC
  Administered 2017-02-03 – 2017-02-04 (×3): 3 mL via INTRAVENOUS

## 2017-02-03 MED ORDER — ALBUTEROL SULFATE (2.5 MG/3ML) 0.083% IN NEBU
3.0000 mL | INHALATION_SOLUTION | Freq: Four times a day (QID) | RESPIRATORY_TRACT | Status: DC | PRN
Start: 2017-02-03 — End: 2017-02-05

## 2017-02-03 MED ORDER — MIDAZOLAM HCL 2 MG/2ML IJ SOLN
INTRAMUSCULAR | Status: AC
  Filled 2017-02-03: qty 2

## 2017-02-03 MED ORDER — PHENYLEPHRINE HCL 10 MG/ML IJ SOLN
INTRAMUSCULAR | Status: DC | PRN
  Administered 2017-02-03 (×2): 80 ug via INTRAVENOUS

## 2017-02-03 MED ORDER — LIDOCAINE-EPINEPHRINE 1 %-1:100000 IJ SOLN
INTRAMUSCULAR | Status: AC
  Filled 2017-02-03: qty 1

## 2017-02-03 MED ORDER — ONDANSETRON HCL 4 MG/2ML IJ SOLN
INTRAMUSCULAR | Status: AC
  Filled 2017-02-03: qty 2

## 2017-02-03 MED ORDER — SODIUM CHLORIDE 0.9 % IV SOLN: INTRAVENOUS | Status: DC

## 2017-02-03 MED ORDER — ALBUMIN HUMAN 5 % IV SOLN
INTRAVENOUS | Status: DC | PRN
  Administered 2017-02-03: 13:00:00 via INTRAVENOUS

## 2017-02-03 MED ORDER — SODIUM CHLORIDE 0.9 % IR SOLN
Status: DC | PRN
  Administered 2017-02-03: 11:00:00

## 2017-02-03 MED ORDER — ALBUTEROL SULFATE (2.5 MG/3ML) 0.083% IN NEBU
2.5000 mg | INHALATION_SOLUTION | Freq: Four times a day (QID) | RESPIRATORY_TRACT | Status: AC
  Administered 2017-02-03: 2.5 mg via RESPIRATORY_TRACT
  Filled 2017-02-03 (×2): qty 3

## 2017-02-03 MED ORDER — TRAMADOL HCL 50 MG PO TABS: 50.0000 mg | ORAL_TABLET | Freq: Two times a day (BID) | ORAL | Status: DC | PRN

## 2017-02-03 MED ORDER — PROPOFOL 10 MG/ML IV BOLUS
INTRAVENOUS | Status: AC
  Filled 2017-02-03: qty 40

## 2017-02-03 MED ORDER — OXYCODONE HCL 5 MG PO TABS
5.0000 mg | ORAL_TABLET | ORAL | Status: DC | PRN
  Administered 2017-02-03 – 2017-02-04 (×3): 10 mg via ORAL
  Administered 2017-02-04 (×2): 5 mg via ORAL
  Administered 2017-02-04 – 2017-02-05 (×3): 10 mg via ORAL
  Administered 2017-02-05: 5 mg via ORAL
  Filled 2017-02-03 (×3): qty 1
  Filled 2017-02-03 (×6): qty 2

## 2017-02-03 MED ORDER — PRAVASTATIN SODIUM 20 MG PO TABS
20.0000 mg | ORAL_TABLET | Freq: Every day | ORAL | Status: DC
  Filled 2017-02-03: qty 1

## 2017-02-03 MED ORDER — PANTOPRAZOLE SODIUM 40 MG PO TBEC
40.0000 mg | DELAYED_RELEASE_TABLET | Freq: Every day | ORAL | Status: DC
Start: 2017-02-03 — End: 2017-02-03

## 2017-02-03 MED ORDER — BUPIVACAINE HCL (PF) 0.5 % IJ SOLN
INTRAMUSCULAR | Status: DC | PRN
  Administered 2017-02-03: 5 mL
  Administered 2017-02-03: 20 mL

## 2017-02-03 MED ORDER — ROCURONIUM BROMIDE 10 MG/ML (PF) SYRINGE
PREFILLED_SYRINGE | INTRAVENOUS | Status: AC
  Filled 2017-02-03: qty 5

## 2017-02-03 MED ORDER — ACETAMINOPHEN 650 MG RE SUPP: 650.0000 mg | RECTAL | Status: DC | PRN

## 2017-02-03 MED ORDER — FENTANYL CITRATE (PF) 100 MCG/2ML IJ SOLN
25.0000 ug | INTRAMUSCULAR | Status: DC | PRN
  Administered 2017-02-03: 25 ug via INTRAVENOUS

## 2017-02-03 MED ORDER — CEFAZOLIN SODIUM-DEXTROSE 2-4 GM/100ML-% IV SOLN
2.0000 g | Freq: Three times a day (TID) | INTRAVENOUS | Status: AC
  Administered 2017-02-03 – 2017-02-04 (×2): 2 g via INTRAVENOUS
  Filled 2017-02-03 (×2): qty 100

## 2017-02-03 MED ORDER — PROPOFOL 10 MG/ML IV BOLUS
INTRAVENOUS | Status: DC | PRN
  Administered 2017-02-03: 70 mg via INTRAVENOUS

## 2017-02-03 MED ORDER — INSULIN ASPART 100 UNIT/ML ~~LOC~~ SOLN
0.0000 [IU] | Freq: Three times a day (TID) | SUBCUTANEOUS | Status: DC
  Administered 2017-02-04: 8 [IU] via SUBCUTANEOUS

## 2017-02-03 MED ORDER — PHENYLEPHRINE HCL 10 MG/ML IJ SOLN
INTRAVENOUS | Status: DC | PRN
  Administered 2017-02-03: 25 ug/min via INTRAVENOUS

## 2017-02-03 MED ORDER — HYDROMORPHONE HCL 1 MG/ML IJ SOLN: 0.5000 mg | INTRAMUSCULAR | Status: DC | PRN

## 2017-02-03 MED ORDER — LIDOCAINE HCL (CARDIAC) 20 MG/ML IV SOLN
INTRAVENOUS | Status: DC | PRN
  Administered 2017-02-03: 100 mg via INTRAVENOUS

## 2017-02-03 MED ORDER — THROMBIN 20000 UNITS EX SOLR
CUTANEOUS | Status: DC | PRN
  Administered 2017-02-03: 11:00:00 via TOPICAL

## 2017-02-03 MED ORDER — AMLODIPINE BESYLATE 5 MG PO TABS: 5.0000 mg | ORAL_TABLET | Freq: Every day | ORAL | Status: DC

## 2017-02-03 MED ORDER — FENTANYL CITRATE (PF) 100 MCG/2ML IJ SOLN
INTRAMUSCULAR | Status: AC
  Filled 2017-02-03: qty 2

## 2017-02-03 MED ORDER — FLEET ENEMA 7-19 GM/118ML RE ENEM: 1.0000 | ENEMA | Freq: Once | RECTAL | Status: DC | PRN

## 2017-02-03 MED ORDER — BUPIVACAINE HCL (PF) 0.5 % IJ SOLN
INTRAMUSCULAR | Status: AC
  Filled 2017-02-03: qty 30

## 2017-02-03 MED ORDER — CEFAZOLIN SODIUM-DEXTROSE 2-4 GM/100ML-% IV SOLN
2.0000 g | INTRAVENOUS | Status: AC
  Administered 2017-02-03: 2 g via INTRAVENOUS
  Filled 2017-02-03: qty 100

## 2017-02-03 MED ORDER — POLYETHYLENE GLYCOL 3350 17 G PO PACK: 17.0000 g | PACK | Freq: Every day | ORAL | Status: DC | PRN

## 2017-02-03 MED ORDER — THROMBIN 20000 UNITS EX SOLR
CUTANEOUS | Status: AC
  Filled 2017-02-03: qty 20000

## 2017-02-03 MED ORDER — ONDANSETRON HCL 4 MG/2ML IJ SOLN
4.0000 mg | Freq: Four times a day (QID) | INTRAMUSCULAR | Status: DC | PRN
  Administered 2017-02-03 – 2017-02-04 (×2): 4 mg via INTRAVENOUS
  Filled 2017-02-03 (×2): qty 2

## 2017-02-03 MED ORDER — ROCURONIUM BROMIDE 100 MG/10ML IV SOLN
INTRAVENOUS | Status: DC | PRN
  Administered 2017-02-03: 10 mg via INTRAVENOUS
  Administered 2017-02-03: 50 mg via INTRAVENOUS

## 2017-02-03 MED ORDER — LOSARTAN POTASSIUM 50 MG PO TABS
100.0000 mg | ORAL_TABLET | Freq: Every day | ORAL | Status: DC
  Administered 2017-02-03 – 2017-02-04 (×2): 100 mg via ORAL
  Filled 2017-02-03 (×2): qty 2

## 2017-02-03 MED ORDER — PHENOL 1.4 % MT LIQD: 1.0000 | OROMUCOSAL | Status: DC | PRN

## 2017-02-03 MED ORDER — ALBUTEROL SULFATE (2.5 MG/3ML) 0.083% IN NEBU
2.5000 mg | INHALATION_SOLUTION | RESPIRATORY_TRACT | Status: AC
  Administered 2017-02-03: 2.5 mg via RESPIRATORY_TRACT

## 2017-02-03 MED ORDER — INSULIN ASPART 100 UNIT/ML ~~LOC~~ SOLN: 0.0000 [IU] | Freq: Three times a day (TID) | SUBCUTANEOUS | Status: DC

## 2017-02-03 MED ORDER — MIDAZOLAM HCL 2 MG/2ML IJ SOLN
INTRAMUSCULAR | Status: DC | PRN
Start: 2017-02-03 — End: 2017-02-03
  Administered 2017-02-03: 2 mg via INTRAVENOUS

## 2017-02-03 MED ORDER — DEXAMETHASONE SODIUM PHOSPHATE 10 MG/ML IJ SOLN
INTRAMUSCULAR | Status: DC | PRN
  Administered 2017-02-03: 8 mg via INTRAVENOUS

## 2017-02-03 MED ORDER — ACETAMINOPHEN 325 MG PO TABS
650.0000 mg | ORAL_TABLET | ORAL | Status: DC | PRN
  Administered 2017-02-05: 650 mg via ORAL
  Filled 2017-02-03: qty 2

## 2017-02-03 MED ORDER — LACTATED RINGERS IV SOLN
INTRAVENOUS | Status: DC | PRN
  Administered 2017-02-03 (×3): via INTRAVENOUS

## 2017-02-03 MED ORDER — SENNA 8.6 MG PO TABS
1.0000 | ORAL_TABLET | Freq: Two times a day (BID) | ORAL | Status: DC
  Administered 2017-02-03 – 2017-02-04 (×2): 8.6 mg via ORAL
  Filled 2017-02-03 (×3): qty 1

## 2017-02-03 MED ORDER — INSULIN ASPART 100 UNIT/ML ~~LOC~~ SOLN
0.0000 [IU] | Freq: Three times a day (TID) | SUBCUTANEOUS | Status: DC
  Administered 2017-02-03: 2 [IU] via SUBCUTANEOUS

## 2017-02-03 MED ORDER — MOMETASONE FURO-FORMOTEROL FUM 200-5 MCG/ACT IN AERO
2.0000 | INHALATION_SPRAY | Freq: Two times a day (BID) | RESPIRATORY_TRACT | Status: DC
  Administered 2017-02-04: 2 via RESPIRATORY_TRACT
  Filled 2017-02-03: qty 8.8

## 2017-02-03 MED ORDER — DULAGLUTIDE 0.75 MG/0.5ML ~~LOC~~ SOAJ: 0.7500 mg | SUBCUTANEOUS | Status: DC

## 2017-02-03 MED ORDER — FENTANYL CITRATE (PF) 100 MCG/2ML IJ SOLN
INTRAMUSCULAR | Status: DC | PRN
  Administered 2017-02-03: 25 ug via INTRAVENOUS
  Administered 2017-02-03: 50 ug via INTRAVENOUS
  Administered 2017-02-03: 150 ug via INTRAVENOUS
  Administered 2017-02-03: 25 ug via INTRAVENOUS

## 2017-02-03 MED ORDER — LOSARTAN POTASSIUM 50 MG PO TABS: 100.0000 mg | ORAL_TABLET | Freq: Every evening | ORAL | Status: DC

## 2017-02-03 SURGICAL SUPPLY — 74 items
ADH SKN CLS APL DERMABOND .7 (GAUZE/BANDAGES/DRESSINGS) ×1
APL SRG 60D 8 XTD TIP BNDBL (TIP)
BAG DECANTER FOR FLEXI CONT (MISCELLANEOUS) ×2 IMPLANT
BASKET BONE COLLECTION (BASKET) ×2 IMPLANT
BLADE CLIPPER SURG (BLADE) IMPLANT
BONE EQUIVA 10CC (Bone Implant) ×1 IMPLANT
BUR MATCHSTICK NEURO 3.0 LAGG (BURR) ×2 IMPLANT
CANISTER SUCT 3000ML PPV (MISCELLANEOUS) ×2 IMPLANT
CARTRIDGE OIL MAESTRO DRILL (MISCELLANEOUS) ×1 IMPLANT
CATH FOLEY 2WAY SLVR  5CC 12FR (CATHETERS) ×1
CATH FOLEY 2WAY SLVR 5CC 12FR (CATHETERS) IMPLANT
CONT SPEC 4OZ CLIKSEAL STRL BL (MISCELLANEOUS) ×2 IMPLANT
COVER BACK TABLE 60X90IN (DRAPES) ×2 IMPLANT
DECANTER SPIKE VIAL GLASS SM (MISCELLANEOUS) ×2 IMPLANT
DERMABOND ADVANCED (GAUZE/BANDAGES/DRESSINGS) ×1
DERMABOND ADVANCED .7 DNX12 (GAUZE/BANDAGES/DRESSINGS) ×1 IMPLANT
DEVICE DISSECT PLASMABLAD 3.0S (MISCELLANEOUS) ×1 IMPLANT
DIFFUSER DRILL AIR PNEUMATIC (MISCELLANEOUS) ×2 IMPLANT
DRAPE C-ARM 42X72 X-RAY (DRAPES) ×4 IMPLANT
DRAPE HALF SHEET 40X57 (DRAPES) ×1 IMPLANT
DRAPE LAPAROTOMY 100X72X124 (DRAPES) ×2 IMPLANT
DRAPE POUCH INSTRU U-SHP 10X18 (DRAPES) ×2 IMPLANT
DRSG OPSITE POSTOP 4X8 (GAUZE/BANDAGES/DRESSINGS) ×1 IMPLANT
DURAPREP 26ML APPLICATOR (WOUND CARE) ×2 IMPLANT
DURASEAL APPLICATOR TIP (TIP) IMPLANT
DURASEAL SPINE SEALANT 3ML (MISCELLANEOUS) IMPLANT
ELECT REM PT RETURN 9FT ADLT (ELECTROSURGICAL) ×2
ELECTRODE REM PT RTRN 9FT ADLT (ELECTROSURGICAL) ×1 IMPLANT
GAUZE SPONGE 4X4 12PLY STRL (GAUZE/BANDAGES/DRESSINGS) ×1 IMPLANT
GAUZE SPONGE 4X4 16PLY XRAY LF (GAUZE/BANDAGES/DRESSINGS) ×1 IMPLANT
GLOVE BIO SURGEON STRL SZ8 (GLOVE) ×1 IMPLANT
GLOVE BIO SURGEON STRL SZ8.5 (GLOVE) ×1 IMPLANT
GLOVE BIOGEL PI IND STRL 6.5 (GLOVE) IMPLANT
GLOVE BIOGEL PI IND STRL 8.5 (GLOVE) ×2 IMPLANT
GLOVE BIOGEL PI INDICATOR 6.5 (GLOVE) ×1
GLOVE BIOGEL PI INDICATOR 8.5 (GLOVE) ×2
GLOVE ECLIPSE 8.5 STRL (GLOVE) ×4 IMPLANT
GLOVE SURG SS PI 6.5 STRL IVOR (GLOVE) ×1 IMPLANT
GOWN STRL REUS W/ TWL LRG LVL3 (GOWN DISPOSABLE) IMPLANT
GOWN STRL REUS W/ TWL XL LVL3 (GOWN DISPOSABLE) IMPLANT
GOWN STRL REUS W/TWL 2XL LVL3 (GOWN DISPOSABLE) ×4 IMPLANT
GOWN STRL REUS W/TWL LRG LVL3 (GOWN DISPOSABLE)
GOWN STRL REUS W/TWL XL LVL3 (GOWN DISPOSABLE) ×2
HEMOSTAT POWDER KIT SURGIFOAM (HEMOSTASIS) ×2 IMPLANT
KIT BASIN OR (CUSTOM PROCEDURE TRAY) ×2 IMPLANT
KIT ROOM TURNOVER OR (KITS) ×2 IMPLANT
MILL MEDIUM DISP (BLADE) ×1 IMPLANT
NDL SPNL 18GX3.5 QUINCKE PK (NEEDLE) IMPLANT
NEEDLE HYPO 22GX1.5 SAFETY (NEEDLE) ×2 IMPLANT
NEEDLE SPNL 18GX3.5 QUINCKE PK (NEEDLE) IMPLANT
NS IRRIG 1000ML POUR BTL (IV SOLUTION) ×2 IMPLANT
OIL CARTRIDGE MAESTRO DRILL (MISCELLANEOUS) ×2
PACK LAMINECTOMY NEURO (CUSTOM PROCEDURE TRAY) ×2 IMPLANT
PAD ARMBOARD 7.5X6 YLW CONV (MISCELLANEOUS) ×6 IMPLANT
PATTIES SURGICAL .5 X1 (DISPOSABLE) ×2 IMPLANT
PATTIES SURGICAL 1X1 (DISPOSABLE) ×1 IMPLANT
PLASMABLADE 3.0S (MISCELLANEOUS) ×4
ROD TI CVD 5.5X65MM (Rod) ×2 IMPLANT
SCREW VITALITY PA 6.5X45MM (Screw) ×6 IMPLANT
SPACER ZYSTON STRT 8X25X10WXX8 (Spacer) ×4 IMPLANT
SPONGE LAP 4X18 X RAY DECT (DISPOSABLE) IMPLANT
SPONGE SURGIFOAM ABS GEL 100 (HEMOSTASIS) ×2 IMPLANT
SUT PROLENE 6 0 BV (SUTURE) IMPLANT
SUT VIC AB 1 CT1 18XBRD ANBCTR (SUTURE) ×1 IMPLANT
SUT VIC AB 1 CT1 8-18 (SUTURE) ×2
SUT VIC AB 2-0 CP2 18 (SUTURE) ×3 IMPLANT
SUT VIC AB 3-0 SH 8-18 (SUTURE) ×3 IMPLANT
SYR 3ML LL SCALE MARK (SYRINGE) ×8 IMPLANT
SYR 5ML LL (SYRINGE) IMPLANT
TOP CLOSURE 5.5-6.0 SHEAR TOP (Neuro Prosthesis/Implant) ×6 IMPLANT
TOWEL GREEN STERILE (TOWEL DISPOSABLE) ×2 IMPLANT
TOWEL GREEN STERILE FF (TOWEL DISPOSABLE) ×2 IMPLANT
TRAY FOLEY W/METER SILVER 16FR (SET/KITS/TRAYS/PACK) ×2 IMPLANT
WATER STERILE IRR 1000ML POUR (IV SOLUTION) ×2 IMPLANT

## 2017-02-03 NOTE — Consult Note (Signed)
Jamie [name]  Jamie [name] INO:[id number] DOB: [birth year] DOA: 02/03/2017 PCP: Evelina Dun, FNP   Requesting physician: elsner Date of [name]: 02/03/17 Reason for [name]: medical management  Impression/Recommendations Active Problems:   Moderate COPD (chronic obstructive pulmonary disease) (HCC)   Smoking   HTN (hypertension)   DM (diabetes mellitus) (HCC)   HLD (hyperlipidemia)   GERD (gastroesophageal reflux disease)   Lumbar radiculopathy, chronic  #1. COPD. Patient is a current smoker. Not on home oxygen. Home medications include Symbicort albuterol inhalers. Breath sounds with fair air movement oxygen saturation level greater than 90% while in PACU on room air -Continue home inhalers -Scheduled nebulizer treatments every 6 hours 3 -Monitor oxygen saturation level -Incentive spirometry -Mobilize as much as possible -If any changes in her status obtain chest x-ray  #2. Diabetes type 2. Recent hemoglobin A1c 7.2. Home medications include 70/30 insulin. Capillary blood sugar 105 while in the postanesthesia care unit. -We will hold her home regimen for now -Use sliding scale insulin for optimal control -Once she is more alert provide carb modified diet and resume home regimen  #3. Hypertension. Only fair control in the postanesthesia care unit. Home medications include losartan and Norvasc. Note she received fentanyl postoperatively -We'll resume losartan today with parameters -Resume Norvasc starting tomorrow -Monitor blood pressure closely -Manage pain  #4. GERD. Home medications include a PPI -Continue PPI  #5. Hyperlipidemia -Continue home medications  #6. Chronic lumbar radiculopathy. Status post lumbar fusion 02/03/2017 -Per neurosurgery    TRH will followup again tomorrow. Thank you for this [name].  Chief Complaint: chronic back pain  HPI: 66 year old female with past medical history significant for COPD not  on home oxygen, diabetes type 2, hyperlipidemia, hypertension, tobacco use underwent decompression and fusion of L2-3 and L3-4 today per Dr Ellene Route. Per chart review has a history of significant back pain as well as bilateral lower extremity pain and weakness. Recently noted worsening weakness in her right leg MRI demonstrated severe spondylitic stenosis.   Review of Systems:  Unable to complete review of systems at time of consult due to PACU  Past Medical History:  Diagnosis Date  . Breast cancer ([place])   . COPD (chronic obstructive pulmonary disease) ([place])   . Diabetes ([place])   . HTN (hypertension)   . Hyperlipidemia   . Vitamin D deficiency    Past Surgical History:  Procedure Laterality Date  . BREAST SURGERY    . CARPAL TUNNEL RELEASE    . CERVICAL SPINE SURGERY    . EYE SURGERY Bilateral    cataracts  . GALLBLADDER SURGERY    . INNER EAR SURGERY     Social History:  reports that she has been smoking Cigarettes.  She has a 11.25 pack-year smoking history. She has never used smokeless tobacco. She reports that she does not drink alcohol or use drugs.  Allergies  Allergen Reactions  . Lipitor [Atorvastatin] Other (See Comments)    Constipation   . Soma [Carisoprodol] Nausea And Vomiting   Family History  Problem Relation Age of Onset  . Heart attack Mother   . Stroke Mother   . Stroke Sister   . Diabetes      sister x3, and mother  . Lung disease Father     worked in [employer]  . Colon cancer Neg Hx   . Colon polyps Neg Hx     Prior to Admission medications   Medication Sig Start Date End Date Taking? Authorizing Provider  ACCU-CHEK FASTCLIX LANCETS MISC Use to check blood sugar two times daily  Dx Code E11.9 01/13/15  Yes Wardell Honour, MD  acetaminophen (TYLENOL) 500 MG tablet Take 1,000 mg by mouth every 6 (six) hours as needed for mild pain.   Yes Historical Provider, MD  albuterol (PROVENTIL HFA;VENTOLIN HFA) 108 (90 BASE) MCG/ACT inhaler Inhale 2 puffs  into the lungs every 6 (six) hours as needed for wheezing or shortness of breath. 12/12/14  Yes Sharion Balloon, FNP  amLODipine (NORVASC) 5 MG tablet TAKE 1 TABLET EVERY DAY Patient taking differently: TAKE 1 TABLET EVERY DAY IN THE EVENING 10/28/16  Yes Sharion Balloon, FNP  BD INSULIN SYRINGE ULTRAFINE 31G X 5/16" 0.5 ML MISC USE TWICE DAILY 08/13/16  Yes Sharion Balloon, FNP  Blood Glucose Monitoring Suppl (ACCU-CHEK AVIVA PLUS) W/DEVICE KIT 1 Device by Does not apply route 2 (two) times daily. 09/30/14  Yes Wardell Honour, MD  budesonide-formoterol [facility]) 160-4.5 MCG/ACT inhaler Inhale 2 puffs into the lungs 2 (two) times daily. Patient taking differently: Inhale 2 puffs into the lungs as needed (FOR SHORTNESS OF BREATH).  10/10/15  Yes Sharion Balloon, FNP  Dulaglutide (TRULICITY) 8.93 YB/0.1BP SOPN Inject 0.75 mg into the skin once a week. Patient taking differently: Inject 0.75 mg into the skin once a week. On Sundays 10/03/16  Yes Sharion Balloon, FNP  insulin NPH-regular Human (NOVOLIN 70/30) (70-30) 100 UNIT/ML injection Inject 30 Units into the skin daily with breakfast. 30 units in AM and 10 units in PM Patient taking differently: Inject 10-30 Units into the skin 2 (two) times daily with a meal. 30 units in AM and 10 units in PM 10/03/16  Yes Sharion Balloon, FNP  losartan (COZAAR) 100 MG tablet TAKE 1 TABLET EVERY DAY Patient taking differently: TAKE 1 TABLET EVERY EVENING 10/28/16  Yes Sharion Balloon, FNP  naproxen sodium (ANAPROX) 220 MG tablet Take 440 mg by mouth 2 (two) times daily as needed (PAIN).   Yes Historical Provider, MD  pravastatin (PRAVACHOL) 20 MG tablet Take 1 tablet (20 mg total) by mouth daily. Patient taking differently: Take 20 mg by mouth every evening.  09/25/16  Yes Sharion Balloon, FNP  ACCU-CHEK AVIVA PLUS test strip USE AS INSTRUCTED 01/01/17   Sharion Balloon, FNP  aspirin EC 81 MG tablet Take 1 tablet (81 mg total) by mouth daily. Patient not taking:  Reported on 01/24/2017 01/30/16   Sharion Balloon, FNP  Cholecalciferol 4000 UNITS CAPS Take 1 capsule (4,000 Units total) by mouth daily. Patient not taking: Reported on 01/24/2017 09/30/14   Wardell Honour, MD  omeprazole (PRILOSEC) 20 MG capsule Take 1 capsule (20 mg total) by mouth daily. Patient not taking: Reported on 01/24/2017 09/26/16   Sharion Balloon, FNP  traMADol (ULTRAM) 50 MG tablet Take 1-2 tablets (50-100 mg total) by mouth every 12 (twelve) hours as needed. Patient not taking: Reported on 01/24/2017 10/03/16   Sharion Balloon, FNP  umeclidinium bromide (INCRUSE ELLIPTA) 62.5 MCG/INH AEPB Inhale 1 puff into the lungs daily. Patient not taking: Reported on 01/24/2017 01/30/16   Sharion Balloon, FNP   Physical Exam: Blood pressure 134/75, pulse 76, temperature 98.9 F (37.2 C), resp. rate 19, SpO2 100 %. Vitals:   02/03/17 1445 02/03/17 1500  BP:    Pulse: 92 76  Resp: (!) 27 19  Temp:       General:  Thin appears frail, remains drowsy  Eyes: PERRL EOMI no scleral icterux  ENT: Clear nose without drainage oral airway intact  Neck: Supple no JVD full range of motion no lymphadenopathy  Cardiovascular: Regular rate and rhythm no murmur gallop or rub no lower extremity edema pedal pulses present and palpable  Respiratory: Normal effort, somewhat shallow breath sounds somewhat coarse scattered rhonchi throughout faint end expiratory wheezing  Abdomen: Distended positive bowel sounds no guarding or rebounding  Skin: Warm and dry no rash  Musculoskeletal: Moving all extremities spontaneously joints without swelling/erythema  Psychiatric: Unable to assess as patient is in the postanesthesia care unit  Neurologic: Moving all extremities spontaneously frequent moaning attempts to open eyes at verbal stimuli  Labs on Admission:  Basic Metabolic Panel:  Recent Labs Lab 01/28/17 1051  NA 139  K 4.1  CL 106  CO2 27  GLUCOSE 254*  BUN 21*  CREATININE 1.01*   CALCIUM 8.9   Liver Function Tests: No results for input(s): AST, ALT, ALKPHOS, BILITOT, PROT, ALBUMIN in the last 168 hours. No results for input(s): LIPASE, AMYLASE in the last 168 hours. No results for input(s): AMMONIA in the last 168 hours. CBC:  Recent Labs Lab 01/28/17 1052  WBC 9.5  HGB 13.6  HCT 42.3  MCV 88.9  PLT 199   Cardiac Enzymes: No results for input(s): CKTOTAL, CKMB, CKMBINDEX, TROPONINI in the last 168 hours. BNP: Invalid input(s): POCBNP CBG:  Recent Labs Lab 01/28/17 1005 02/03/17 0730 02/03/17 0944  GLUCAP 245* 113* 102*    Radiological Exams on Admission: Dg Lumbar Spine 2-3 Views  Result Date: 02/03/2017 CLINICAL DATA:  L2-L4 decompression and fusion. EXAM: LUMBAR SPINE - 2-3 VIEW; DG C-ARM 61-120 MIN COMPARISON:  12/25/2016 FINDINGS: Multiple C-arm images showed diskectomy, decompression and fusion from L2 through L4. Components appear well positioned. No radiographically detectable complication. IMPRESSION: PLIF L2-L4. Electronically Signed   By: Nelson Chimes M.D.   On: 02/03/2017 14:17   Dg Lumbar Spine 1 View  Result Date: 02/03/2017 CLINICAL DATA:  L2-3, L3-4 fusion EXAM: LUMBAR SPINE - 1 VIEW COMPARISON:  MRI December 25, 2016 and x-ray of January 08, 2017 FINDINGS: Surgical retractors are seen posteriorly centered at the L3 level. IMPRESSION: Surgical retractors are seen posteriorly, centered at the L3 level. Degenerative changes most marked at L2-3 and L3-4. Electronically Signed   By: Dorise Bullion III M.D   On: 02/03/2017 11:25   Dg C-arm 1-60 Min  Result Date: 02/03/2017 CLINICAL DATA:  L2-L4 decompression and fusion. EXAM: LUMBAR SPINE - 2-3 VIEW; DG C-ARM 61-120 MIN COMPARISON:  12/25/2016 FINDINGS: Multiple C-arm images showed diskectomy, decompression and fusion from L2 through L4. Components appear well positioned. No radiographically detectable complication. IMPRESSION: PLIF L2-L4. Electronically Signed   By: Nelson Chimes M.D.   On:  02/03/2017 14:17    EKG:   Time spent: 65 minutes  [facility]   If 7PM-7AM, please contact night-coverage [email or web address] Password [email or web address] 02/03/2017, 3:02 PM

## 2017-02-03 NOTE — Anesthesia Procedure Notes (Signed)
Procedure Name: Intubation Date/Time: 02/03/2017 10:11 AM Performed by: Merdis Delay Pre-anesthesia Checklist: Patient identified, Emergency Drugs available, Suction available, Patient being monitored and Timeout performed Patient Re-evaluated:Patient Re-evaluated prior to inductionOxygen Delivery Method: Circle system utilized Preoxygenation: Pre-oxygenation with 100% oxygen Intubation Type: IV induction Ventilation: Mask ventilation without difficulty Laryngoscope Size: Miller and 2 Grade View: Grade I Tube type: Oral Tube size: 7.0 mm Number of attempts: 1 Airway Equipment and Method: Stylet Placement Confirmation: ETT inserted through vocal cords under direct vision,  breath sounds checked- equal and bilateral and positive ETCO2 Secured at: 20 cm Tube secured with: Tape Dental Injury: Teeth and Oropharynx as per pre-operative assessment

## 2017-02-03 NOTE — H&P (Signed)
Jamie [name] is an 66 y.o. female.   Chief Complaint: Back and bilateral leg pain, weakness of right lower extremity HPI: Jamie [name] is a 66 year old individual is had significant back pain bilateral lower extremity pain particularly right leg weakness she is been a long-standing patient of Dr. Harley Hallmark. She's not had any surgery in the past and had been treated for a number of years conservatively. Recently she's noted that her right leg is become increasingly weak in MRI demonstrates the patient has severe spondylitic stenosis at L2-3 and L3-4. She's been advised regarding the need for surgery to decompress and stabilize these 2 joints.  Past Medical History:  Diagnosis Date  . Breast cancer ([place])   . COPD (chronic obstructive pulmonary disease) ([place])   . Diabetes ([place])   . HTN (hypertension)   . Hyperlipidemia   . Vitamin D deficiency     Past Surgical History:  Procedure Laterality Date  . BREAST SURGERY    . CARPAL TUNNEL RELEASE    . CERVICAL SPINE SURGERY    . EYE SURGERY Bilateral    cataracts  . GALLBLADDER SURGERY    . INNER EAR SURGERY      Family History  Problem Relation Age of Onset  . Heart attack Mother   . Stroke Mother   . Stroke Sister   . Diabetes      sister x3, and mother  . Lung disease Father     worked in [employer]  . Colon cancer Neg Hx   . Colon polyps Neg Hx    Social History:  reports that she has been smoking Cigarettes.  She has a 11.25 pack-year smoking history. She has never used smokeless tobacco. She reports that she does not drink alcohol or use drugs.  Allergies:  Allergies  Allergen Reactions  . Lipitor [Atorvastatin] Other (See Comments)    Constipation   . Soma [Carisoprodol] Nausea And Vomiting    Medications Prior to Admission  Medication Sig Dispense Refill  . ACCU-CHEK FASTCLIX LANCETS MISC Use to check blood sugar two times daily  Dx Code E11.9 180 each 1  . acetaminophen (TYLENOL) 500 MG tablet Take 1,000 mg by mouth  every 6 (six) hours as needed for mild pain.    Marland Kitchen albuterol (PROVENTIL HFA;VENTOLIN HFA) 108 (90 BASE) MCG/ACT inhaler Inhale 2 puffs into the lungs every 6 (six) hours as needed for wheezing or shortness of breath. 1 Inhaler 2  . amLODipine (NORVASC) 5 MG tablet TAKE 1 TABLET EVERY DAY (Patient taking differently: TAKE 1 TABLET EVERY DAY IN THE EVENING) 90 tablet 1  . BD INSULIN SYRINGE ULTRAFINE 31G X 5/16" 0.5 ML MISC USE TWICE DAILY 180 each 2  . Blood Glucose Monitoring Suppl (ACCU-CHEK AVIVA PLUS) W/DEVICE KIT 1 Device by Does not apply route 2 (two) times daily. 1 kit 0  . budesonide-formoterol (SYMBICORT) 160-4.5 MCG/ACT inhaler Inhale 2 puffs into the lungs 2 (two) times daily. (Patient taking differently: Inhale 2 puffs into the lungs as needed (FOR SHORTNESS OF BREATH). ) 1 Inhaler 6  . Dulaglutide (TRULICITY) 6.73 AL/9.3XT SOPN Inject 0.75 mg into the skin once a week. (Patient taking differently: Inject 0.75 mg into the skin once a week. On Sundays) 12 pen 1  . insulin NPH-regular Human (NOVOLIN 70/30) (70-30) 100 UNIT/ML injection Inject 30 Units into the skin daily with breakfast. 30 units in AM and 10 units in PM (Patient taking differently: Inject 10-30 Units into the skin 2 (two) times  daily with a meal. 30 units in AM and 10 units in PM) 40 mL 11  . losartan (COZAAR) 100 MG tablet TAKE 1 TABLET EVERY DAY (Patient taking differently: TAKE 1 TABLET EVERY EVENING) 90 tablet 1  . naproxen sodium (ANAPROX) 220 MG tablet Take 440 mg by mouth 2 (two) times daily as needed (PAIN).    Marland Kitchen pravastatin (PRAVACHOL) 20 MG tablet Take 1 tablet (20 mg total) by mouth daily. (Patient taking differently: Take 20 mg by mouth every evening. ) 90 tablet 1  . ACCU-CHEK AVIVA PLUS test strip USE AS INSTRUCTED 300 each 0  . aspirin EC 81 MG tablet Take 1 tablet (81 mg total) by mouth daily. (Patient not taking: Reported on 01/24/2017) 90 tablet 1  . Cholecalciferol 4000 UNITS CAPS Take 1 capsule (4,000 Units  total) by mouth daily. (Patient not taking: Reported on 01/24/2017) 90 capsule 3  . omeprazole (PRILOSEC) 20 MG capsule Take 1 capsule (20 mg total) by mouth daily. (Patient not taking: Reported on 01/24/2017) 90 capsule 3  . traMADol (ULTRAM) 50 MG tablet Take 1-2 tablets (50-100 mg total) by mouth every 12 (twelve) hours as needed. (Patient not taking: Reported on 01/24/2017) 120 tablet 3  . umeclidinium bromide (INCRUSE ELLIPTA) 62.5 MCG/INH AEPB Inhale 1 puff into the lungs daily. (Patient not taking: Reported on 01/24/2017) 30 each 6    Results for orders placed or performed during the hospital encounter of 02/03/17 (from the past 48 hour(s))  Glucose, capillary     Status: Abnormal   Collection Time: 02/03/17  7:30 AM  Result Value Ref Range   Glucose-Capillary 113 (H) 65 - 99 mg/dL  Glucose, capillary     Status: Abnormal   Collection Time: 02/03/17  9:44 AM  Result Value Ref Range   Glucose-Capillary 102 (H) 65 - 99 mg/dL   No results found.  Review of Systems  HENT: Negative.   Eyes: Negative.   Respiratory: Negative.   Cardiovascular: Negative.   Gastrointestinal: Negative.   Genitourinary: Negative.   Musculoskeletal: Positive for back pain.  Skin: Negative.   Neurological: Positive for sensory change, focal weakness and weakness.  Endo/Heme/Allergies: Negative.   Psychiatric/Behavioral: Negative.     Blood pressure (!) 162/81, pulse 70, temperature 98.3 F (36.8 C), temperature source Oral, resp. rate 18, SpO2 98 %. Physical Exam  Constitutional: She is oriented to person, place, and time. She appears well-developed and well-nourished.  HENT:  Head: Normocephalic and atraumatic.  Eyes: Conjunctivae and EOM are normal. Pupils are equal, round, and reactive to light.  Neck: Normal range of motion. Neck supple.  Cardiovascular: Normal rate and regular rhythm.   Respiratory: Effort normal and breath sounds normal.  GI: Soft. Bowel sounds are normal.  Neurological: She  is alert and oriented to person, place, and time.  Moderate leg weakness in the right iliopsoas and quadriceps graded at 4 minus out of 5. Absent deep 10 reflexes and patellae and Achilles on the right side. The leg raising is positive at 15 on the right negative to 80 on the left Patrick's maneuver is negative bilaterally.  Skin: Skin is warm and dry.  Psychiatric: She has a normal mood and affect. Her behavior is normal. Judgment and thought content normal.     Assessment/Plan Spondylosis and stenosis L2-3 and L3-4,  Plan decompression and fusion L2-3 and L3-4.  Earleen Newport, MD 02/03/2017, 9:56 AM

## 2017-02-03 NOTE — Transfer of Care (Signed)
Immediate Anesthesia Transfer of Care Note  Patient: Jamie [name]  Procedure(s) Performed: Procedure(s) with comments: Lumbar Two-Three, Lumbar Three- Four Posterior lumbar interbody fusion (N/A) - L2-3 L3-4 Posterior lumbar interbody fusion  Patient Location: PACU  Anesthesia Type:General  Level of Consciousness: drowsy  Airway & Oxygen Therapy: Patient Spontanous Breathing and Patient connected to face mask oxygen  Post-op Assessment: Report given to RN and Post -op Vital signs reviewed and stable  Post vital signs: Reviewed and stable  Last Vitals:  Vitals:   02/03/17 0735  BP: (!) 162/81  Pulse: 70  Resp: 18  Temp: 36.8 C    Last Pain:  Vitals:   02/03/17 0735  TempSrc: Oral         Complications: No apparent anesthesia complications

## 2017-02-03 NOTE — Op Note (Signed)
Date of surgery: 02/03/2017 Preoperative diagnosis: L2-3 and L3-4 spondylosis with stenosis, right lumbar radiculopathy. Right lower extremity weakness. Postoperative diagnosis: Same Procedure: L2-3 and L3-4 decompression via laminectomies with more work than required for simple interbody technique. Posterior lumbar interbody arthrodesis with peek spacers local autograft and allograft L2-3 and L3-4. Segmental fixation L2-L4 with posterior lateral arthrodesis using local autograft and allograft. Surgeon: Kristeen Miss First assistant: Newman Pies M.D. Anesthesia: Gen. endotracheal Indications: Jamie [name] is a 66 year old individual is had significant back and right lower extremity pain with weakness she's been followed for number of years but now has developed a large extruded fragment of disc on top of significant chronic spondylosis at the level of L3-4 lesser extent at the level of L2-3. She's been advised regarding the need for surgical decompression arthrodesis.  Procedure: Patient was brought to the operating room. Placed on table supine position. After the smooth induction of general endotracheal anesthesia she was turned prone. The back was prepped without wall DuraPrep and draped in a sterile fashion. Midline incision was created carried down to the lumbar dorsal fascia which was opened on either side of midline to expose the spinous processes of L2-L3 and L4. Interlaminar spaces at L2-3 and L3-4 were then cleared. Large laminotomies removing the inferior marginal lamina of L2-L2 the entirety of the facet at L2-3 was performed. This was done bilaterally. Care was taken to protect the L2 nerve root superiorly and decompressive 4 out into the foramen. The L3 nerve root inferiorly was carefully protected and decompressed using a 2 and 3 mm Kerrison punch. Attention was then turned to L3-4 with similar laminotomies were created and here on the right side there was noted be a large extruded fragment  of disc that went both in the foramen and extraforaminal he. Fragments of disc were removed from along the L3 nerve root on the right side. Once this decompression was completed the disc space were entered on both sites and total discectomies at L2-3 and L3-4 performed. The endplates were rongeured and curettaged using a toothed curette. Once this was accomplished the interspaces were sized for appropriate size spacer was felt that a 25 mm long 8 mm tall 8 lordotic spacer would fit best into each of the interspaces L2-3 and L3-4. This was packed with accommodation of autograft and allograft and L3 for total of 9 mL of bone graft along with spacers placed in L2-3 6 mL of bone graft with spacers was placed. Radiographic confirmation of position was obtained. Pedicle entry sites were then chosen at L2-L3 and L4 not before decorticating the intertransverse spaces from L2-L4. The remainder of the bone graft for a total of 9 mL was packed into these lateral gutters. Then 6.5 x 45 mm pedicle screws were placed in L2-L3 and L4 using fluoroscopic guidance. The position was verified both in the AP and lateral projections and once confirmed 65 mm precontoured rods were used to connect the screw heads together. Connection was done in a neutral fashion. Final radiographs were obtained in AP and lateral projection in these confirmed normal alignment. With this hemostasis was checked and the soft tissues and once verified the lumbar dorsal fascia was reapproximated with #1 Vicryl 2-0 Vicryl was used in subcutaneous tissues and 3-0 Vicryl was used to close subcutaneous take her skin. Dermabond was placed on the skin. 200 mL. No Cell Saver blood was returned to the patient. She was returned to recovery room in stable condition.

## 2017-02-04 DIAGNOSIS — K219 Gastro-esophageal reflux disease without esophagitis: Secondary | ICD-10-CM

## 2017-02-04 DIAGNOSIS — J449 Chronic obstructive pulmonary disease, unspecified: Secondary | ICD-10-CM

## 2017-02-04 DIAGNOSIS — I10 Essential (primary) hypertension: Secondary | ICD-10-CM

## 2017-02-04 DIAGNOSIS — E118 Type 2 diabetes mellitus with unspecified complications: Secondary | ICD-10-CM

## 2017-02-04 LAB — GLUCOSE, CAPILLARY
GLUCOSE-CAPILLARY: 144 mg/dL — AB (ref 65–99)
GLUCOSE-CAPILLARY: 67 mg/dL (ref 65–99)
GLUCOSE-CAPILLARY: 132 mg/dL — AB (ref 65–99)
Glucose-Capillary: 251 mg/dL — ABNORMAL HIGH (ref 65–99)
Glucose-Capillary: 51 mg/dL — ABNORMAL LOW (ref 65–99)
Glucose-Capillary: 171 mg/dL — ABNORMAL HIGH (ref 65–99)

## 2017-02-04 LAB — CBC
HEMATOCRIT: 34.4 % — AB (ref 36.0–46.0)
Hemoglobin: 11.1 g/dL — ABNORMAL LOW (ref 12.0–15.0)
MCH: 28.4 pg (ref 26.0–34.0)
MCHC: 32.3 g/dL (ref 30.0–36.0)
MCV: 88 fL (ref 78.0–100.0)
Platelets: 192 10*3/uL (ref 150–400)
RBC: 3.91 MIL/uL (ref 3.87–5.11)
RDW: 14.5 % (ref 11.5–15.5)
WBC: 14.4 10*3/uL — AB (ref 4.0–10.5)

## 2017-02-04 LAB — BASIC METABOLIC PANEL
Anion gap: 7 (ref 5–15)
BUN: 15 mg/dL (ref 6–20)
CALCIUM: 8.8 mg/dL — AB (ref 8.9–10.3)
CO2: 27 mmol/L (ref 22–32)
Chloride: 100 mmol/L — ABNORMAL LOW (ref 101–111)
Creatinine, Ser: 1.01 mg/dL — ABNORMAL HIGH (ref 0.44–1.00)
GFR calc Af Amer: >60 mL/min (ref >60)
GFR calc non Af Amer: 58 mL/min — ABNORMAL LOW (ref >60)
GLUCOSE: 250 mg/dL — AB (ref 65–99)
POTASSIUM: 5.2 mmol/L — AB (ref 3.5–5.1)
Sodium: 134 mmol/L — ABNORMAL LOW (ref 135–145)

## 2017-02-04 MED ORDER — INSULIN ASPART PROT & ASPART (70-30 MIX) 100 UNIT/ML ~~LOC~~ SUSP
8.0000 [IU] | Freq: Two times a day (BID) | SUBCUTANEOUS | Status: DC
  Filled 2017-02-04: qty 10

## 2017-02-04 MED ORDER — INSULIN ASPART PROT & ASPART (70-30 MIX) 100 UNIT/ML ~~LOC~~ SUSP
15.0000 [IU] | Freq: Two times a day (BID) | SUBCUTANEOUS | Status: DC
  Administered 2017-02-04: 15 [IU] via SUBCUTANEOUS
  Filled 2017-02-04: qty 10

## 2017-02-04 MED ORDER — PRAVASTATIN SODIUM 20 MG PO TABS: 20.0000 mg | ORAL_TABLET | Freq: Every day | ORAL | Status: DC

## 2017-02-04 MED FILL — Heparin Sodium (Porcine) Inj 1000 Unit/ML: INTRAMUSCULAR | Qty: 30 | Status: AC

## 2017-02-04 MED FILL — Sodium Chloride IV Soln 0.9%: INTRAVENOUS | Qty: 1000 | Status: AC

## 2017-02-04 MED FILL — Thrombin For Soln 5000 Unit: CUTANEOUS | Qty: 5000 | Status: AC

## 2017-02-04 NOTE — Progress Notes (Signed)
PROGRESS NOTE        PATIENT DETAILS Name: Jamie [name] Age: 66 y.o. Sex: female Date of Birth: [birth year] Admit Date: 02/03/2017 Admitting Physician Kristeen Miss, MD PTW:[id number] Lenna Gilford, FNP  Brief Narrative: Patient is a 66 y.o. female with prior history of COPD, type 2 diabetes on insulin, hypertension-admitted by neurosurgery for back surgery. Hospitalist service was consulted for management of diabetes.  Subjective: Very hard of hearing. But appears comfortable.  Denies chest pain or shortness of breath  Assessment/Plan: Insulin-dependent type 2 diabetes: Hypoglycemic episode this morning-suspect she may not be compliant to diet at home and requires more insulin at home. Decrease insulin 70/30-8 units twice a day.  Hypertension: Controlled, continue amlodipine and losartan.  Dyslipidemia: Continue statin  COPD: Lungs clear-this appears stable. Continue current bronchodilator regimen.  GERD: Continue PPI  Rest per neurosurgery.  Antimicrobial agents: Anti-infectives    Start     Dose/Rate Route Frequency Ordered Stop   02/03/17 1800  ceFAZolin (ANCEF) IVPB 2g/100 mL premix     2 g 200 mL/hr over 30 Minutes Intravenous Every 8 hours 02/03/17 1553 02/04/17 0136   02/03/17 1052  bacitracin 50,000 Units in sodium chloride irrigation 0.9 % 500 mL irrigation  Status:  Discontinued       As needed 02/03/17 1053 02/03/17 1404   02/03/17 0600  ceFAZolin (ANCEF) IVPB 2g/100 mL premix     2 g 200 mL/hr over 30 Minutes Intravenous On call to O.R. 02/03/17 0555 02/03/17 1021      Time spent: 25- minutes-Greater than 50% of this time was spent in counseling, explanation of diagnosis, planning of further management, and coordination of care.  MEDICATIONS: Scheduled Meds: . amLODipine  5 mg Oral Daily  . docusate sodium  100 mg Oral BID  . insulin aspart  0-15 Units Subcutaneous TID WC  . insulin aspart  0-5 Units Subcutaneous QHS  . insulin aspart  protamine- aspart  15 Units Subcutaneous BID WC  . losartan  100 mg Oral Daily  . mometasone-formoterol  2 puff Inhalation BID  . pantoprazole  40 mg Oral Daily  . [START ON 02/05/2017] pravastatin  20 mg Oral q1800  . senna  1 tablet Oral BID  . sodium chloride flush  3 mL Intravenous Q12H  . umeclidinium bromide  1 puff Inhalation Daily   Continuous Infusions: . sodium chloride    . sodium chloride     PRN Meds:.acetaminophen **OR** acetaminophen, albuterol, alum & mag hydroxide-simeth, bisacodyl, HYDROmorphone (DILAUDID) injection, menthol-cetylpyridinium **OR** phenol, ondansetron **OR** ondansetron (ZOFRAN) IV, oxyCODONE, polyethylene glycol, sodium chloride flush, sodium phosphate, traMADol   PHYSICAL EXAM: Vital signs: Vitals:   02/03/17 2339 02/04/17 0400 02/04/17 0812 02/04/17 1229  BP: 138/71 (!) 175/75 (!) 117/59 121/70  Pulse: 87 95 91 87  Resp: 18 18 18 20   Temp: 98.4 F (36.9 C) 98.2 F (36.8 C) 98.8 F (37.1 C) 98.7 F (37.1 C)  TempSrc: Oral Oral Oral Oral  SpO2: 96% 97% 96% 97%   There were no vitals filed for this visit. There is no height or weight on file to calculate BMI.   General appearance :Awake, alert, not in any distress.  Eyes:, pupils equally reactive to light and accomodation HEENT: Atraumatic and Normocephalic Neck: supple, no JVD. No cervical lymphadenopathy. Resp:Good air entry bilaterally, no added sounds  CVS: S1 S2 regular,  no murmurs.  GI: Bowel sounds present, Non tender and not distended with no gaurding, rigidity or rebound. Extremities: B/L Lower Ext shows no edema, both legs are warm to touch Neurology:  speech clear,Non focal, sensation is grossly intact. Psychiatric: Normal judgment and insight. Alert and oriented x 3. Normal mood. Musculoskeletal:No digital cyanosis Skin:No Rash, warm and dry Wounds:N/A  I have personally reviewed following labs and imaging studies  LABORATORY DATA: CBC:  Recent Labs Lab 02/03/17 1454  02/04/17 0415  WBC 8.9 14.4*  HGB 10.9* 11.1*  HCT 34.4* 34.4*  MCV 88.7 88.0  PLT 187 491    Basic Metabolic Panel:  Recent Labs Lab 02/03/17 1454 02/04/17 0415  NA 136 134*  K 4.5 5.2*  CL 103 100*  CO2 24 27  GLUCOSE 263* 250*  BUN 16 15  CREATININE 0.87 1.01*  CALCIUM 8.5* 8.8*    GFR: Estimated Creatinine Clearance: 38.3 mL/min (A) (by C-G formula based on SCr of 1.01 mg/dL (H)).  Liver Function Tests:  Recent Labs Lab 02/03/17 1454  AST 22  ALT 22  ALKPHOS 54  BILITOT 0.5  PROT 5.1*  ALBUMIN 3.2*   No results for input(s): LIPASE, AMYLASE in the last 168 hours. No results for input(s): AMMONIA in the last 168 hours.  Coagulation Profile: No results for input(s): INR, PROTIME in the last 168 hours.  Cardiac Enzymes: No results for input(s): CKTOTAL, CKMB, CKMBINDEX, TROPONINI in the last 168 hours.  BNP (last 3 results) No results for input(s): PROBNP in the last 8760 hours.  HbA1C: No results for input(s): HGBA1C in the last 72 hours.  CBG:  Recent Labs Lab 02/03/17 1725 02/03/17 2105 02/04/17 0803 02/04/17 1222 02/04/17 1331  GLUCAP 127* 324* 251* 51* 67    Lipid Profile: No results for input(s): CHOL, HDL, LDLCALC, TRIG, CHOLHDL, LDLDIRECT in the last 72 hours.  Thyroid Function Tests: No results for input(s): TSH, T4TOTAL, FREET4, T3FREE, THYROIDAB in the last 72 hours.  Anemia Panel: No results for input(s): VITAMINB12, FOLATE, FERRITIN, TIBC, IRON, RETICCTPCT in the last 72 hours.  Urine analysis:    Component Value Date/Time   COLORURINE YELLOW 12/31/2014 0409   APPEARANCEUR CLEAR 12/31/2014 0409   LABSPEC >1.030 (H) 12/31/2014 0409   PHURINE 6.0 12/31/2014 0409   GLUCOSEU NEGATIVE 12/31/2014 0409   HGBUR NEGATIVE 12/31/2014 0409   BILIRUBINUR neg 01/11/2015 1442   KETONESUR NEGATIVE 12/31/2014 0409   PROTEINUR neg 01/11/2015 1442   PROTEINUR NEGATIVE 12/31/2014 0409   UROBILINOGEN negative 01/11/2015 1442    UROBILINOGEN 0.2 12/31/2014 0409   NITRITE neg 01/11/2015 1442   NITRITE NEGATIVE 12/31/2014 0409   LEUKOCYTESUR Trace 01/11/2015 1442    Sepsis Labs: Lactic Acid, Venous No results found for: LATICACIDVEN  MICROBIOLOGY: Recent Results (from the past 240 hour(s))  Surgical pcr screen     Status: None   Collection Time: 01/28/17 10:50 AM  Result Value Ref Range Status   MRSA, PCR NEGATIVE NEGATIVE Final   Staphylococcus aureus NEGATIVE NEGATIVE Final    Comment:        The Xpert SA Assay (FDA approved for NASAL specimens in patients over 86 years of age), is one component of a comprehensive surveillance program.  Test performance has been validated by [facility] for patients greater than or equal to 78 year old. It is not intended to diagnose infection nor to guide or monitor treatment.     RADIOLOGY STUDIES/RESULTS: Dg Lumbar Spine 2-3 Views  Result Date: 02/03/2017 CLINICAL  DATA:  L2-L4 decompression and fusion. EXAM: LUMBAR SPINE - 2-3 VIEW; DG C-ARM 61-120 MIN COMPARISON:  12/25/2016 FINDINGS: Multiple C-arm images showed diskectomy, decompression and fusion from L2 through L4. Components appear well positioned. No radiographically detectable complication. IMPRESSION: PLIF L2-L4. Electronically Signed   By: Nelson Chimes M.D.   On: 02/03/2017 14:17   Dg Lumbar Spine 1 View  Result Date: 02/03/2017 CLINICAL DATA:  L2-3, L3-4 fusion EXAM: LUMBAR SPINE - 1 VIEW COMPARISON:  MRI December 25, 2016 and x-ray of January 08, 2017 FINDINGS: Surgical retractors are seen posteriorly centered at the L3 level. IMPRESSION: Surgical retractors are seen posteriorly, centered at the L3 level. Degenerative changes most marked at L2-3 and L3-4. Electronically Signed   By: Dorise Bullion III M.D   On: 02/03/2017 11:25   Dg C-arm 1-60 Min  Result Date: 02/03/2017 CLINICAL DATA:  L2-L4 decompression and fusion. EXAM: LUMBAR SPINE - 2-3 VIEW; DG C-ARM 61-120 MIN COMPARISON:  12/25/2016 FINDINGS:  Multiple C-arm images showed diskectomy, decompression and fusion from L2 through L4. Components appear well positioned. No radiographically detectable complication. IMPRESSION: PLIF L2-L4. Electronically Signed   By: Nelson Chimes M.D.   On: 02/03/2017 14:17     LOS: 1 day   Oren Binet, MD  [facility] Pager:[phone]  If 7PM-7AM, please contact night-coverage [email or web address] Password [email or web address] 02/04/2017, 1:58 PM

## 2017-02-04 NOTE — Anesthesia Postprocedure Evaluation (Signed)
Anesthesia Post Note  Patient: Jamie [name]  Procedure(s) Performed: Procedure(s) (LRB): Lumbar Two-Three, Lumbar Three- Four Posterior lumbar interbody fusion (N/A)  Patient location during evaluation: PACU Anesthesia Type: General Level of consciousness: awake and alert Pain management: pain level controlled Vital Signs Assessment: post-procedure vital signs reviewed and stable Respiratory status: spontaneous breathing, nonlabored ventilation, respiratory function stable and patient connected to nasal cannula oxygen Cardiovascular status: blood pressure returned to baseline and stable Postop Assessment: no signs of nausea or vomiting Anesthetic complications: no       Last Vitals:  Vitals:   02/04/17 0812 02/04/17 1229  BP: (!) 117/59 121/70  Pulse: 91 87  Resp: 18 20  Temp: 37.1 C 37.1 C    Last Pain:  Vitals:   02/04/17 1229  TempSrc: Oral  PainSc:                  Riccardo Dubin

## 2017-02-04 NOTE — Progress Notes (Signed)
CRITICAL VALUE ALERT  Critical value received:  51 recheck of 67 will recheck....  Date of notification:  02/04/17  Time of notification:  1339  Patient noted with low blood sugar at lunch time. Pt ate adequate breakfast this AM. Pt appears asymptomatic for hypoglycemia curretnly. Pt able to tolerate given carbs at this time. Lunch at bedside. Will Continue to monitor.   Ave Filter, RN

## 2017-02-04 NOTE — Progress Notes (Signed)
Patient ID: Jamie [name], female   DOB: [birth year], 66 y.o.   MRN: [id number] Vital signs are stable Postoperative hemoglobin 11 Patient having some nausea this morning This appears to have resolved She notes her leg pain is better Back has moderate soreness Blood sugars around 250 this morning If under better control may consider discharge tomorrow

## 2017-02-04 NOTE — Evaluation (Signed)
Occupational Therapy Evaluation Patient Details Name: Jamie [name] MRN: [id number] DOB: 1951/08/12 Today's Date: 02/04/2017    History of Present Illness 66 yo female s/p L2-3 3-4 Posterior lumbar interbody arthrodesis with peek spacers local autograft and allograft   Clinical Impression   Patient is s/p posterior lumbar interbody arthrodesis L2-3 L3-4  surgery resulting in functional limitations due to the deficits listed below (see OT problem list). PTA was independent with all adls.  Patient will benefit from skilled OT acutely to increase independence and safety with ADLS to allow discharge 3n1 for home.     Follow Up Recommendations  No OT follow up    Equipment Recommendations  3 in 1 bedside commode;Other (comment) (RW)    Recommendations for Other Services       Precautions / Restrictions Precautions Precautions: Back Precaution Comments: handout provided and reviewed for adls . reviewed don doff brace Required Braces or Orthoses: Spinal Brace Spinal Brace: Lumbar corset;Applied in sitting position      Mobility Bed Mobility               General bed mobility comments: sitting eob on arrival with SCD still attached. pt unaware of safety concerns  Transfers Overall transfer level: Needs assistance Equipment used: 1 person hand held assist Transfers: Sit to/from Stand Sit to Stand: Min guard         General transfer comment: cues for safety and bending    Balance                                           ADL either performed or assessed with clinical judgement   ADL Overall ADL's : Needs assistance/impaired Eating/Feeding: Supervision/ safety;Sitting Eating/Feeding Details (indicate cue type and reason): pt nauseated Rn providing IV and medication. pt requesting food despite nausea Grooming: Wash/dry hands;Standing Grooming Details (indicate cue type and reason): needed cues for safety  Upper Body Bathing: Set up;Sitting    Lower Body Bathing: Minimal assistance;Sit to/from stand   Upper Body Dressing : Set up     Lower Body Dressing Details (indicate cue type and reason): able to cross bil LE Toilet Transfer: Minimal assistance;Ambulation;BSC           Functional mobility during ADLs: Minimal assistance General ADL Comments: pt reports that she will not have help at home     Vision Baseline Vision/History: No visual deficits       Perception     Praxis      Pertinent Vitals/Pain Pain Assessment: Faces Faces Pain Scale: Hurts little more Pain Location: back Pain Descriptors / Indicators: Operative site guarding Pain Intervention(s): Monitored during session;Premedicated before session;Repositioned     Hand Dominance Right   Extremity/Trunk Assessment Upper Extremity Assessment Upper Extremity Assessment: Overall WFL for tasks assessed   Lower Extremity Assessment Lower Extremity Assessment: Defer to PT evaluation   Cervical / Trunk Assessment Cervical / Trunk Assessment: Other exceptions (s/p surg)   Communication Communication Communication: HOH (speak to R ear)   Cognition Arousal/Alertness: Awake/alert Behavior During Therapy: WFL for tasks assessed/performed Overall Cognitive Status: Within Functional Limits for tasks assessed                                     General Comments       Exercises  Shoulder Instructions      Home Living Family/patient expects to be discharged to:: Private residence Living Arrangements: Spouse/significant other Available Help at Discharge: Family;Available PRN/intermittently Type of Home: House Home Access: Stairs to enter [employer] of Steps: 3 Entrance Stairs-Rails: None Home Layout: One level     Bathroom Shower/Tub: Teacher, early years/pre: Standard     Home Equipment: None   Additional Comments: pt has cat 5 kittens and one small dog in the home. pt reports spouse works 12 hour  shifts      Prior Functioning/Environment Level of Independence: Independent                 OT Problem List: Decreased strength;Decreased activity tolerance;Impaired balance (sitting and/or standing);Decreased safety awareness;Decreased knowledge of use of DME or AE;Decreased knowledge of precautions;Pain      OT Treatment/Interventions: Self-care/ADL training;Therapeutic exercise;DME and/or AE instruction;Therapeutic activities;Patient/family education;Balance training    OT Goals(Current goals can be found in the care plan section) Acute Rehab OT Goals Patient Stated Goal: to return home OT Goal Formulation: With patient Time For Goal Achievement: 02/18/17 Potential to Achieve Goals: Good  OT Frequency: Min 2X/week   Barriers to D/C:            Co-evaluation              End of Session Equipment Utilized During Treatment: Back brace Nurse Communication: Mobility status;Precautions  Activity Tolerance: Patient tolerated treatment well Patient left: in chair;with call bell/phone within reach  OT Visit Diagnosis: Unsteadiness on feet (R26.81)                Time: 5929-2446 OT Time Calculation (min): 21 min Charges:  OT General Charges $OT Visit: 1 Procedure OT Evaluation $OT Eval Moderate Complexity: 1 Procedure G-Codes:      Jeri Modena   OTR/L Pager: [phone] Office: [phone] .   Parke Poisson B 02/04/2017, 8:56 AM

## 2017-02-04 NOTE — Evaluation (Signed)
Physical Therapy Evaluation Patient Details Name: Jamie [name] MRN: [id number] DOB: 04-15-1951 Today's Date: 02/04/2017   History of Present Illness  66 yo female s/p L2-3 3-4 Posterior lumbar interbody arthrodesis with peek spacers local autograft and allograft.  PMH significant for HTN, HLD, DM, COPD, CA.  Clinical Impression  Pt presents to PT with decreased tolerance for functional mobility due to above procedure.  This session was limited because pt is very HOH and seemed aggravated from hunger and feeling hot.  She ambulated with the RW requiring min guard for safety and min assist for posture correction at times.  She required heavy cueing for safety awareness and walker management.  Her husband works 12-hour shifts, so decreased caregiver support may be of concern. She will benefit from continued skilled therapy acutely and upon D/C with HHPT in order to maximize return to PLOF. Will follow acutely.    Follow Up Recommendations Home health PT;Supervision for mobility/OOB    Equipment Recommendations  Rolling walker with 5" wheels;3in1 (PT)    Recommendations for Other Services       Precautions / Restrictions Precautions Precautions: Back;Fall Precaution Booklet Issued: Yes (comment) Precaution Comments: Pt able to recall 3/3 back precautions Required Braces or Orthoses: Spinal Brace Spinal Brace: Lumbar corset Restrictions Weight Bearing Restrictions: No      Mobility  Bed Mobility               General bed mobility comments: Pt OOB in recliner at start of session  Transfers Overall transfer level: Needs assistance Equipment used: 1 person hand held assist Transfers: Sit to/from Stand Sit to Stand: Min guard         General transfer comment: VC's required for precaution maintenance and hand placement on seated surface.  Ambulation/Gait Ambulation/Gait assistance: Min guard;Min assist Ambulation Distance (Feet): 100 Feet Assistive device: Straight  cane;Rolling walker (2 wheeled) Gait Pattern/deviations: Step-through pattern;Decreased stride length;Trunk flexed;Narrow base of support Gait velocity: decreased Gait velocity interpretation: Below normal speed for age/gender General Gait Details: Pt started with straight cane but was reaching to hold on with other hand so given RW.  Cues required for upright posture and walker proximity.  Pt bent over walker and required physical assist to stand straight because of hearing deficit and inattention to precautions.  Pt appeared aggravated, stating she had not eaten since Sunday and that she was hot.  Stairs Stairs: Yes Stairs assistance: Min guard Stair Management: Two rails;Step to pattern Number of Stairs: 3 General stair comments: Pt seemed more aggravated with stair training and not responding to commands.  Wheelchair Mobility    Modified Rankin (Stroke Patients Only)       Balance Overall balance assessment: Needs assistance Sitting-balance support: No upper extremity supported;Feet supported Sitting balance-Leahy Scale: Good     Standing balance support: Bilateral upper extremity supported Standing balance-Leahy Scale: Poor                               Pertinent Vitals/Pain Pain Assessment: Faces Faces Pain Scale: Hurts little more Pain Location: back Pain Descriptors / Indicators: Operative site guarding Pain Intervention(s): Monitored during session;Repositioned    Home Living Family/patient expects to be discharged to:: Private residence Living Arrangements: Spouse/significant other Available Help at Discharge: Family;Available PRN/intermittently Type of Home: House Home Access: Stairs to enter Entrance Stairs-Rails: None Entrance Stairs-Number of Steps: 3 Home Layout: One level Home Equipment: None Additional Comments: pt has cat 5  kittens and one small dog in the home. pt reports spouse works 12 hour shifts    Prior Function Level of  Independence: Independent               Hand Dominance   Dominant Hand: Right    Extremity/Trunk Assessment   Upper Extremity Assessment Upper Extremity Assessment: Defer to OT evaluation    Lower Extremity Assessment Lower Extremity Assessment: Overall WFL for tasks assessed    Cervical / Trunk Assessment Cervical / Trunk Assessment:  (forward head )  Communication   Communication: HOH  Cognition Arousal/Alertness: Awake/alert Behavior During Therapy: Anxious (Slightly impulsive) Overall Cognitive Status: Within Functional Limits for tasks assessed                                        General Comments General comments (skin integrity, edema, etc.): Pt sweating during ambulation. Very HOH with R ear better than L.    Exercises     Assessment/Plan    PT Assessment Patient needs continued PT services  PT Problem List Decreased activity tolerance;Decreased balance;Decreased mobility;Decreased coordination;Decreased knowledge of use of DME;Decreased safety awareness;Decreased knowledge of precautions;Pain       PT Treatment Interventions DME instruction;Gait training;Stair training;Functional mobility training;Therapeutic activities;Therapeutic exercise;Balance training;Cognitive remediation    PT Goals (Current goals can be found in the Care Plan section)  Acute Rehab PT Goals Patient Stated Goal: to return home PT Goal Formulation: With patient Time For Goal Achievement: 02/11/17 Potential to Achieve Goals: Good    Frequency Min 5X/week   Barriers to discharge Decreased caregiver support Husband works 12 hour shifts    Co-evaluation               End of Session Equipment Utilized During Treatment: Gait belt;Back brace Activity Tolerance: Treatment limited secondary to agitation Patient left: in chair;with call bell/phone within reach Nurse Communication: Mobility status PT Visit Diagnosis: Unsteadiness on feet (R26.81);Other  abnormalities of gait and mobility (R26.89);Pain Pain - part of body:  (back)    Time: 3361-2244 PT Time Calculation (min) (ACUTE ONLY): 22 min   Charges:   PT Evaluation $PT Eval Moderate Complexity: 1 Procedure     PT G Codes:        Gaetano Net SPT  Gaetano Net 02/04/2017, 1:10 PM

## 2017-02-05 LAB — POCT I-STAT 4, (NA,K, GLUC, HGB,HCT)
Glucose, Bld: 217 mg/dL — ABNORMAL HIGH (ref 65–99)
HCT: 32 % — ABNORMAL LOW (ref 36.0–46.0)
Hemoglobin: 10.9 g/dL — ABNORMAL LOW (ref 12.0–15.0)
POTASSIUM: 4.6 mmol/L (ref 3.5–5.1)
SODIUM: 138 mmol/L (ref 135–145)

## 2017-02-05 LAB — GLUCOSE, CAPILLARY: Glucose-Capillary: 225 mg/dL — ABNORMAL HIGH (ref 65–99)

## 2017-02-05 MED ORDER — HYDROCODONE-ACETAMINOPHEN 5-325 MG PO TABS: 1.0000 | ORAL_TABLET | Freq: Four times a day (QID) | ORAL | 0 refills | Status: DC | PRN

## 2017-02-05 MED ORDER — METHOCARBAMOL 500 MG PO TABS: 500.0000 mg | ORAL_TABLET | Freq: Four times a day (QID) | ORAL | 3 refills | Status: DC

## 2017-02-05 NOTE — Progress Notes (Signed)
Occupational Therapy Treatment Patient Details Name: Jamie [name] MRN: [id number] DOB: 08-Dec-1950 Today's Date: 02/05/2017    History of present illness 66 yo female s/p L2-3 3-4 Posterior lumbar interbody arthrodesis with peek spacers local autograft and allograft.  PMH significant for HTN, HLD, DM, COPD, CA.   OT comments  Pt demonstrates poor recall of precautions demonstrating bending with adls and lack of awareness to safety with back brace. Pt anxious and discussing events that occurred in childhood. Pt states "maybe if my momma would have fixed my ears at 66 yo I could hear. I dont know why she didn't! Momma why?" crying and then refocused to task at hand. Pt smiling and then very sorrowful during session. Pt nauseated with task without producing vomit this session.    Follow Up Recommendations  No OT follow up    Equipment Recommendations   (in room already)    Recommendations for Other Services      Precautions / Restrictions Precautions Precautions: Back;Fall Precaution Booklet Issued: Yes (comment) Precaution Comments: cues for precautions to avoid bending during adls Required Braces or Orthoses: Spinal Brace Spinal Brace: Lumbar corset;Applied in sitting position Restrictions Weight Bearing Restrictions: No       Mobility Bed Mobility Overal bed mobility: Modified Independent Bed Mobility: Rolling;Sidelying to Sit Rolling: Supervision Sidelying to sit: Supervision       General bed mobility comments: Pt quickly transitioned to EOB. Considering speed, she surprisingly maintained her precautions fairly well.   Transfers Overall transfer level: Needs assistance Equipment used: None Transfers: Sit to/from Stand Sit to Stand: Supervision         General transfer comment: cues for brace     Balance Overall balance assessment: Needs assistance Sitting-balance support: No upper extremity supported;Feet supported Sitting balance-Leahy Scale: Good      Standing balance support: Bilateral upper extremity supported Standing balance-Leahy Scale: Poor                             ADL either performed or assessed with clinical judgement   ADL Overall ADL's : Needs assistance/impaired Eating/Feeding: Independent   Grooming: Wash/dry hands;Wash/dry face;Standing;Minimal assistance Grooming Details (indicate cue type and reason): pt needed cues to avoid breaking precautions Upper Body Bathing: Set up;Standing Upper Body Bathing Details (indicate cue type and reason): needed cues for not leaning on sink level . pt anxious during task       Upper Body Dressing Details (indicate cue type and reason): requires cues to don brace. pt lacks awareness to need for brace     Toilet Transfer: Supervision/safety           Functional mobility during ADLs: Supervision/safety General ADL Comments: pt has all DMe in the room at this time     Vision       Perception     Praxis      Cognition Arousal/Alertness: Awake/alert Behavior During Therapy: Anxious Overall Cognitive Status: Within Functional Limits for tasks assessed                                 General Comments: pt states "where are you [place]?" randomly and tearful due to spouse not present.    Pt educated on bathing and avoid washing directly on incision. Pt educated to use new wash cloth and towel each day. Pt educated to allow water to run across dressing and not  to soak in a tub at this time. Pt advised RN will instruct on any bandages required otherwise is open to air.         Exercises     Shoulder Instructions       General Comments      Pertinent Vitals/ Pain       Pain Assessment: Faces Faces Pain Scale: Hurts a little bit Pain Location: back Pain Descriptors / Indicators: Operative site guarding Pain Intervention(s): Monitored during session;Premedicated before session;Repositioned  Home Living                                           Prior Functioning/Environment              Frequency  Min 2X/week        Progress Toward Goals  OT Goals(current goals can now be found in the care plan section)  Progress towards OT goals: Progressing toward goals  Acute Rehab OT Goals Patient Stated Goal: to return home OT Goal Formulation: With patient Time For Goal Achievement: 02/18/17 Potential to Achieve Goals: Good ADL Goals Pt Will Perform Grooming: with set-up;standing Pt Will Perform Lower Body Bathing: with set-up;sit to/from stand Pt Will Transfer to Toilet: with set-up;bedside commode;ambulating  Plan Discharge plan remains appropriate    Co-evaluation                 End of Session Equipment Utilized During Treatment: Rolling walker;Back brace  OT Visit Diagnosis: Unsteadiness on feet (R26.81)   Activity Tolerance Patient tolerated treatment well   Patient Left in chair;with call bell/phone within reach   Nurse Communication Mobility status;Precautions        Time: 8938-1017 OT Time Calculation (min): 16 min  Charges: OT General Charges $OT Visit: 1 Procedure OT Treatments $Self Care/Home Management : 8-22 mins   Jeri Modena   OTR/L Pager: [phone] Office: [phone] .    Parke Poisson B 02/05/2017, 12:32 PM

## 2017-02-05 NOTE — Discharge Summary (Signed)
Physician Discharge Summary  Patient ID: Jamie [name] MRN: [id number] DOB/AGE: 04/14/1951 66 y.o.  Admit date: 02/03/2017 Discharge date: 02/05/2017  Admission Diagnoses:Spondylosis and stenosis with neurogenic claudication and lumbar radiculopathy L2-3 and L3-4  Discharge Diagnoses: Spondylosis and stenosis with neurogenic claudication and lumbar radiculopathy L2-3 L3-4. Diabetes mellitus. COPD. Active Problems:   Moderate COPD (chronic obstructive pulmonary disease) (HCC)   Smoking   HTN (hypertension)   DM (diabetes mellitus) (HCC)   HLD (hyperlipidemia)   GERD (gastroesophageal reflux disease)   Lumbar radiculopathy, chronic   Diabetes mellitus with complication [facility])   Elective surgery   Discharged Condition: good  Hospital Course: Patient was admitted to undergo surgical decompression and stabilization from L2-L4. She tolerated surgery well. Her incision is clean and dry.  Consults: Hospitalist  Significant Diagnostic Studies: None  Treatments: surgery: Decompression fusion L2-3 L3-4  Discharge Exam: Blood pressure (!) 147/72, pulse (!) 111, temperature 98.4 F (36.9 C), temperature source Oral, resp. rate 20, SpO2 90 %. Incision is clean and dry, motor function is intact in lower extremities. Station and gait are intact.  Disposition: 01-Home or Self Care  Discharge Instructions    Call MD for:  redness, tenderness, or signs of infection (pain, swelling, redness, odor or green/yellow discharge around incision site)    Complete by:  As directed    Call MD for:  severe uncontrolled pain    Complete by:  As directed    Call MD for:  temperature >100.4    Complete by:  As directed    Diet - low sodium heart healthy    Complete by:  As directed    Discharge instructions    Complete by:  As directed    Okay to shower. Do not apply salves or appointments to incision. No heavy lifting with the upper extremities greater than 15 pounds. May resume driving when not  requiring pain medication and patient feels comfortable with doing so.   Incentive spirometry RT    Complete by:  As directed    Increase activity slowly    Complete by:  As directed      Allergies as of 02/05/2017      Reactions   Lipitor [atorvastatin] Other (See Comments)   Constipation    Soma [carisoprodol] Nausea And Vomiting      Medication List    TAKE these medications   ACCU-CHEK AVIVA PLUS test strip Generic drug:  glucose blood USE AS INSTRUCTED   ACCU-CHEK AVIVA PLUS w/Device Kit 1 Device by Does not apply route 2 (two) times daily.   ACCU-CHEK FASTCLIX LANCETS Misc Use to check blood sugar two times daily  Dx Code E11.9   acetaminophen 500 MG tablet Commonly known as:  TYLENOL Take 1,000 mg by mouth every 6 (six) hours as needed for mild pain.   albuterol 108 (90 Base) MCG/ACT inhaler Commonly known as:  PROVENTIL HFA;VENTOLIN HFA Inhale 2 puffs into the lungs every 6 (six) hours as needed for wheezing or shortness of breath.   amLODipine 5 MG tablet Commonly known as:  NORVASC TAKE 1 TABLET EVERY DAY What changed:  See the new instructions.   aspirin EC 81 MG tablet Take 1 tablet (81 mg total) by mouth daily.   BD INSULIN SYRINGE ULTRAFINE 31G X 5/16" 0.5 ML Misc Generic drug:  Insulin Syringe-Needle U-100 USE TWICE DAILY   budesonide-formoterol 160-4.5 MCG/ACT inhaler Commonly known as:  SYMBICORT Inhale 2 puffs into the lungs 2 (two) times daily. What changed:  when to take this  reasons to take this   Cholecalciferol 4000 units Caps Take 1 capsule (4,000 Units total) by mouth daily.   Dulaglutide 0.75 MG/0.5ML Sopn Commonly known as:  TRULICITY Inject 1.91 mg into the skin once a week. What changed:  additional instructions   HYDROcodone-acetaminophen 5-325 MG tablet Commonly known as:  NORCO Take 1-2 tablets by mouth every 6 (six) hours as needed for moderate pain.   insulin NPH-regular Human (70-30) 100 UNIT/ML injection Commonly  known as:  NOVOLIN 70/30 Inject 30 Units into the skin daily with breakfast. 30 units in AM and 10 units in PM What changed:  how much to take  when to take this  additional instructions   losartan 100 MG tablet Commonly known as:  COZAAR TAKE 1 TABLET EVERY DAY What changed:  See the new instructions.   methocarbamol 500 MG tablet Commonly known as:  ROBAXIN Take 1 tablet (500 mg total) by mouth 4 (four) times daily.   naproxen sodium 220 MG tablet Commonly known as:  ANAPROX Take 440 mg by mouth 2 (two) times daily as needed (PAIN).   omeprazole 20 MG capsule Commonly known as:  PRILOSEC Take 1 capsule (20 mg total) by mouth daily.   pravastatin 20 MG tablet Commonly known as:  PRAVACHOL Take 1 tablet (20 mg total) by mouth daily. What changed:  when to take this   traMADol 50 MG tablet Commonly known as:  ULTRAM Take 1-2 tablets (50-100 mg total) by mouth every 12 (twelve) hours as needed.   umeclidinium bromide 62.5 MCG/INH Aepb Commonly known as:  INCRUSE ELLIPTA Inhale 1 puff into the lungs daily.            Durable Medical Equipment        Start     Ordered   02/04/17 1056  For home use only DME 3 n 1  Once     02/04/17 1055   02/04/17 1056  For home use only DME Walker rolling  Once    Question:  Patient needs a walker to treat with the following condition  Answer:  Unsteady gait   02/04/17 1055       Signed: Earleen Newport 02/05/2017, 9:07 AM

## 2017-02-05 NOTE — Progress Notes (Signed)
Patient alert and oriented, mae's well, voiding adequate amount of urine, swallowing without difficulty, no c/o pain. Patient discharged home with family. Script and discharged instructions given to patient. Patient and family stated understanding of d/c instructions given and has an appointment with MD. 

## 2017-02-05 NOTE — Progress Notes (Signed)
Physical Therapy Treatment Patient Details Name: Jamie [name] MRN: [id number] DOB: 1951-07-18 Today's Date: 02/05/2017    History of Present Illness 66 yo female s/p L2-3 3-4 Posterior lumbar interbody arthrodesis with peek spacers local autograft and allograft.  PMH significant for HTN, HLD, DM, COPD, CA.    PT Comments    Pt progressing towards physical therapy goals. Was able to perform transfers and ambulation with gross min guard assist for balance support and safety. Occasional min to mod assist was provided to keep pt from breaking precautions (sitting down on stairs) and for walker management. Husband was present at end of session for education. Will continue to follow.    Follow Up Recommendations  Home health PT;Supervision for mobility/OOB     Equipment Recommendations  Rolling walker with 5" wheels;3in1 (PT)    Recommendations for Other Services       Precautions / Restrictions Precautions Precautions: Back;Fall Precaution Booklet Issued: Yes (comment) Precaution Comments: Pt was cued for precautions during functional mobility Required Braces or Orthoses: Spinal Brace Spinal Brace: Lumbar corset;Applied in sitting position Restrictions Weight Bearing Restrictions: No    Mobility  Bed Mobility Overal bed mobility: Needs Assistance Bed Mobility: Rolling;Sidelying to Sit Rolling: Supervision Sidelying to sit: Supervision       General bed mobility comments: Pt quickly transitioned to EOB. Considering speed, she surprisingly maintained her precautions fairly well.   Transfers Overall transfer level: Needs assistance Equipment used: None Transfers: Sit to/from Stand Sit to Stand: Min guard;Supervision         General transfer comment: VC's required for precaution maintenance and hand placement on seated surface. Pt standing several times without assist however with decreased overall safety awareness.   Ambulation/Gait Ambulation/Gait assistance: Min  guard;Min assist Ambulation Distance (Feet): 200 Feet Assistive device: Rolling walker (2 wheeled) Gait Pattern/deviations: Step-through pattern;Decreased stride length;Trunk flexed;Narrow base of support Gait velocity: decreased Gait velocity interpretation: Below normal speed for age/gender General Gait Details: VC's for upright posture and increased walker proximity. Occasional assist was required for walker positioning.    Stairs     Stair Management: Two rails;Step to pattern Number of Stairs: 3 General stair comments: Pt with difficulty managing stairs without railing for support. Encouraged pt to practice without rails as she will not have them at home. Pt impulsively turned around to sit down on the stairs without warning. Mod assist was provided to keep pt from sitting down.   Wheelchair Mobility    Modified Rankin (Stroke Patients Only)       Balance Overall balance assessment: Needs assistance Sitting-balance support: No upper extremity supported;Feet supported Sitting balance-Leahy Scale: Good     Standing balance support: Bilateral upper extremity supported Standing balance-Leahy Scale: Poor                              Cognition Arousal/Alertness: Awake/alert Behavior During Therapy: Anxious (Slightly impulsive) Overall Cognitive Status: Within Functional Limits for tasks assessed                                        Exercises      General Comments        Pertinent Vitals/Pain Pain Assessment: Faces Faces Pain Scale: Hurts a little bit Pain Location: back Pain Descriptors / Indicators: Operative site guarding Pain Intervention(s): Monitored during session    Home Living  Prior Function            PT Goals (current goals can now be found in the care plan section) Acute Rehab PT Goals Patient Stated Goal: to return home PT Goal Formulation: With patient Time For Goal Achievement:  02/11/17 Potential to Achieve Goals: Good Progress towards PT goals: Progressing toward goals    Frequency    Min 5X/week      PT Plan Current plan remains appropriate    Co-evaluation             End of Session Equipment Utilized During Treatment: Gait belt;Back brace Activity Tolerance: Treatment limited secondary to agitation Patient left: in chair;with call bell/phone within reach Nurse Communication: Mobility status PT Visit Diagnosis: Unsteadiness on feet (R26.81);Other abnormalities of gait and mobility (R26.89);Pain Pain - part of body:  (back)     Time: 7290-2111 PT Time Calculation (min) (ACUTE ONLY): 20 min  Charges:  $Gait Training: 8-22 mins                    G Codes:       Jamie [name], PT, DPT Acute Rehabilitation Services Pager: [phone]    Jamie [name] 02/05/2017, 9:57 AM

## 2017-02-06 DIAGNOSIS — F17201 Nicotine dependence, unspecified, in remission: Secondary | ICD-10-CM | POA: Diagnosis not present

## 2017-02-06 DIAGNOSIS — M5416 Radiculopathy, lumbar region: Secondary | ICD-10-CM | POA: Diagnosis not present

## 2017-02-06 DIAGNOSIS — R269 Unspecified abnormalities of gait and mobility: Secondary | ICD-10-CM | POA: Diagnosis not present

## 2017-02-06 DIAGNOSIS — R911 Solitary pulmonary nodule: Secondary | ICD-10-CM | POA: Diagnosis not present

## 2017-02-06 DIAGNOSIS — J449 Chronic obstructive pulmonary disease, unspecified: Secondary | ICD-10-CM | POA: Diagnosis not present

## 2017-02-19 DIAGNOSIS — M5126 Other intervertebral disc displacement, lumbar region: Secondary | ICD-10-CM | POA: Diagnosis not present

## 2017-02-19 DIAGNOSIS — M5416 Radiculopathy, lumbar region: Secondary | ICD-10-CM | POA: Diagnosis not present

## 2017-03-07 ENCOUNTER — Emergency Department (HOSPITAL_COMMUNITY): Payer: Medicare HMO

## 2017-03-07 ENCOUNTER — Emergency Department (HOSPITAL_COMMUNITY)
Admission: EM | Admit: 2017-03-07 | Discharge: 2017-03-07 | Disposition: A | Discharge status: Home / Self Care | Payer: Medicare HMO | Attending: Emergency Medicine | Admitting: Emergency Medicine

## 2017-03-07 ENCOUNTER — Encounter (HOSPITAL_COMMUNITY): Payer: Self-pay | Admitting: Emergency Medicine

## 2017-03-07 DIAGNOSIS — E1136 Type 2 diabetes mellitus with diabetic cataract: Secondary | ICD-10-CM | POA: Diagnosis not present

## 2017-03-07 DIAGNOSIS — R222 Localized swelling, mass and lump, trunk: Secondary | ICD-10-CM | POA: Insufficient documentation

## 2017-03-07 DIAGNOSIS — J449 Chronic obstructive pulmonary disease, unspecified: Secondary | ICD-10-CM | POA: Insufficient documentation

## 2017-03-07 DIAGNOSIS — M545 Low back pain: Secondary | ICD-10-CM | POA: Insufficient documentation

## 2017-03-07 DIAGNOSIS — I10 Essential (primary) hypertension: Secondary | ICD-10-CM | POA: Diagnosis not present

## 2017-03-07 DIAGNOSIS — G8918 Other acute postprocedural pain: Secondary | ICD-10-CM | POA: Diagnosis not present

## 2017-03-07 DIAGNOSIS — Z794 Long term (current) use of insulin: Secondary | ICD-10-CM | POA: Diagnosis not present

## 2017-03-07 DIAGNOSIS — Z853 Personal history of malignant neoplasm of breast: Secondary | ICD-10-CM | POA: Insufficient documentation

## 2017-03-07 DIAGNOSIS — L02212 Cutaneous abscess of back [any part, except buttock]: Secondary | ICD-10-CM | POA: Diagnosis not present

## 2017-03-07 DIAGNOSIS — F17210 Nicotine dependence, cigarettes, uncomplicated: Secondary | ICD-10-CM | POA: Insufficient documentation

## 2017-03-07 DIAGNOSIS — M9689 Other intraoperative and postprocedural complications and disorders of the musculoskeletal system: Secondary | ICD-10-CM | POA: Diagnosis not present

## 2017-03-07 LAB — CBC WITH DIFFERENTIAL/PLATELET
BASOS ABS: 0 10*3/uL (ref 0.0–0.1)
Basophils Relative: 0 %
Eosinophils Absolute: 0.3 10*3/uL (ref 0.0–0.7)
Eosinophils Relative: 4 %
HEMATOCRIT: 37.3 % (ref 36.0–46.0)
HEMOGLOBIN: 11.9 g/dL — AB (ref 12.0–15.0)
LYMPHS PCT: 20 %
Lymphs Abs: 1.5 10*3/uL (ref 0.7–4.0)
MCH: 28.6 pg (ref 26.0–34.0)
MCHC: 31.9 g/dL (ref 30.0–36.0)
MCV: 89.7 fL (ref 78.0–100.0)
Monocytes Absolute: 0.7 10*3/uL (ref 0.1–1.0)
Monocytes Relative: 9 %
NEUTROS ABS: 5 10*3/uL (ref 1.7–7.7)
NEUTROS PCT: 67 %
PLATELETS: 211 10*3/uL (ref 150–400)
RBC: 4.16 MIL/uL (ref 3.87–5.11)
RDW: 14.6 % (ref 11.5–15.5)
WBC: 7.5 10*3/uL (ref 4.0–10.5)

## 2017-03-07 LAB — I-STAT CG4 LACTIC ACID, ED
LACTIC ACID, VENOUS: 1.69 mmol/L (ref 0.5–1.9)
Lactic Acid, Venous: 1 mmol/L (ref 0.5–1.9)

## 2017-03-07 LAB — COMPREHENSIVE METABOLIC PANEL
ALT: 17 U/L (ref 14–54)
ANION GAP: 7 (ref 5–15)
AST: 18 U/L (ref 15–41)
Albumin: 3.1 g/dL — ABNORMAL LOW (ref 3.5–5.0)
Alkaline Phosphatase: 92 U/L (ref 38–126)
BILIRUBIN TOTAL: 0.2 mg/dL — AB (ref 0.3–1.2)
BUN: 15 mg/dL (ref 6–20)
CO2: 26 mmol/L (ref 22–32)
Calcium: 8.9 mg/dL (ref 8.9–10.3)
Chloride: 105 mmol/L (ref 101–111)
Creatinine, Ser: 0.73 mg/dL (ref 0.44–1.00)
GFR calc Af Amer: >60 mL/min (ref >60)
Glucose, Bld: 131 mg/dL — ABNORMAL HIGH (ref 65–99)
POTASSIUM: 3.7 mmol/L (ref 3.5–5.1)
Sodium: 138 mmol/L (ref 135–145)
TOTAL PROTEIN: 5.7 g/dL — AB (ref 6.5–8.1)

## 2017-03-07 MED ORDER — HYDROCODONE-ACETAMINOPHEN 5-325 MG PO TABS
2.0000 | ORAL_TABLET | Freq: Once | ORAL | Status: AC
  Administered 2017-03-07: 2 via ORAL
  Filled 2017-03-07: qty 2

## 2017-03-07 MED ORDER — HYDROCODONE-ACETAMINOPHEN 5-325 MG PO TABS: 1.0000 | ORAL_TABLET | Freq: Four times a day (QID) | ORAL | 0 refills | Status: DC | PRN

## 2017-03-07 NOTE — ED Triage Notes (Signed)
Pt had back surgery in April, states she had a ruptured disc, buldging disc, and a "disc that exploded". Pt has back brace. Pt states the area around her incision is swollen, "i have fluid on my back". Pt denies fevers. Incision on back is swollen, but not red. Edges are approximated.

## 2017-03-07 NOTE — ED Notes (Signed)
Patient returned from CT

## 2017-03-07 NOTE — Discharge Instructions (Signed)
Take tylenol, motrin for pain.   Apply ice on the area   Take vicodin for severe pain.   See your doctor  Return to ER if you have worse back pain or swelling, numbness, weakness, trouble walking.

## 2017-03-07 NOTE — ED Provider Notes (Signed)
[facility] DEPT Provider Note   CSN: [id number] Arrival date & time: 03/07/17  1553     History   Chief Complaint Chief Complaint  Patient presents with  . Back Pain  . Post-op Problem    HPI Jamie [name] is a 66 y.o. female hx of COPD, DM, HTN, Chronic spinal stenosis status post depression and stabilization on 4/23 here with worsening back pain. She states that she ran out of pain medicine. This morning, she woke up in severe pain and felt that her back is more swollen. Denies any trouble urinating or weakness. Able to walk at home but has more pain. Denies numbness.   The history is provided by the patient.    Past Medical History:  Diagnosis Date  . Breast cancer ([place])   . COPD (chronic obstructive pulmonary disease) ([place])   . Diabetes ([place])   . HTN (hypertension)   . Hyperlipidemia   . Vitamin D deficiency     Patient Active Problem List   Diagnosis Date Noted  . Lumbar radiculopathy, chronic 02/03/2017  . Diabetes mellitus with complication ([place])   . Elective surgery   . Chronic back pain 10/03/2016  . Underweight 05/02/2016  . GERD (gastroesophageal reflux disease) 02/13/2015  . Hard of hearing 02/10/2014  . DM (diabetes mellitus) ([place]) 10/13/2013  . HLD (hyperlipidemia) 10/13/2013  . HTN (hypertension)   . Vitamin D deficiency   . Coronary artery calcification 08/31/2013  . Oral thrush 03/30/2013  . Lung nodule, right upper lobe 8 mm March 2014 02/04/2013  . Moderate COPD (chronic obstructive pulmonary disease) ([place]) 02/04/2013  . Smoking 02/04/2013    Past Surgical History:  Procedure Laterality Date  . BREAST SURGERY    . CARPAL TUNNEL RELEASE    . CERVICAL SPINE SURGERY    . EYE SURGERY Bilateral    cataracts  . GALLBLADDER SURGERY    . INNER EAR SURGERY      OB History    No data available       Home Medications    Prior to Admission medications   Medication Sig Start Date End Date Taking? Authorizing Provider  ACCU-CHEK AVIVA  PLUS test strip USE AS INSTRUCTED 01/01/17   Evelina Dun A, FNP  ACCU-CHEK FASTCLIX LANCETS MISC Use to check blood sugar two times daily  Dx Code E11.9 01/13/15   Wardell Honour, MD  acetaminophen (TYLENOL) 500 MG tablet Take 1,000 mg by mouth every 6 (six) hours as needed for mild pain.    [provider]  albuterol (PROVENTIL HFA;VENTOLIN HFA) 108 (90 BASE) MCG/ACT inhaler Inhale 2 puffs into the lungs every 6 (six) hours as needed for wheezing or shortness of breath. 12/12/14   Evelina Dun A, FNP  amLODipine (NORVASC) 5 MG tablet TAKE 1 TABLET EVERY DAY Patient taking differently: TAKE 1 TABLET EVERY DAY IN THE EVENING 10/28/16   Evelina Dun A, FNP  aspirin EC 81 MG tablet Take 1 tablet (81 mg total) by mouth daily. Patient not taking: Reported on 01/24/2017 01/30/16   Sharion Balloon, FNP  BD INSULIN SYRINGE ULTRAFINE 31G X 5/16" 0.5 ML MISC USE TWICE DAILY 08/13/16   Evelina Dun A, FNP  Blood Glucose Monitoring Suppl (ACCU-CHEK AVIVA PLUS) W/DEVICE KIT 1 Device by Does not apply route 2 (two) times daily. 09/30/14   Wardell Honour, MD  budesonide-formoterol [facility]) 160-4.5 MCG/ACT inhaler Inhale 2 puffs into the lungs 2 (two) times daily. Patient taking differently: Inhale 2 puffs into the  lungs as needed (FOR SHORTNESS OF BREATH).  10/10/15   Evelina Dun A, FNP  Cholecalciferol 4000 UNITS CAPS Take 1 capsule (4,000 Units total) by mouth daily. Patient not taking: Reported on 01/24/2017 09/30/14   Wardell Honour, MD  Dulaglutide (TRULICITY) 2.94 TM/5.4YT SOPN Inject 0.75 mg into the skin once a week. Patient taking differently: Inject 0.75 mg into the skin once a week. On Sundays 10/03/16   Sharion Balloon, FNP  HYDROcodone-acetaminophen (NORCO) 5-325 MG tablet Take 1-2 tablets by mouth every 6 (six) hours as needed for moderate pain. 02/05/17   Kristeen Miss, MD  insulin NPH-regular Human (NOVOLIN 70/30) (70-30) 100 UNIT/ML injection Inject 30 Units into the skin  daily with breakfast. 30 units in AM and 10 units in PM Patient taking differently: Inject 10-30 Units into the skin 2 (two) times daily with a meal. 30 units in AM and 10 units in PM 10/03/16   Evelina Dun A, FNP  losartan (COZAAR) 100 MG tablet TAKE 1 TABLET EVERY DAY Patient taking differently: TAKE 1 TABLET EVERY EVENING 10/28/16   Hawks, Christy A, FNP  methocarbamol (ROBAXIN) 500 MG tablet Take 1 tablet (500 mg total) by mouth 4 (four) times daily. 02/05/17   Kristeen Miss, MD  naproxen sodium (ANAPROX) 220 MG tablet Take 440 mg by mouth 2 (two) times daily as needed (PAIN).    [provider]  omeprazole (PRILOSEC) 20 MG capsule Take 1 capsule (20 mg total) by mouth daily. Patient not taking: Reported on 01/24/2017 09/26/16   Evelina Dun A, FNP  pravastatin (PRAVACHOL) 20 MG tablet Take 1 tablet (20 mg total) by mouth daily. Patient taking differently: Take 20 mg by mouth every evening.  09/25/16   Sharion Balloon, FNP  traMADol (ULTRAM) 50 MG tablet Take 1-2 tablets (50-100 mg total) by mouth every 12 (twelve) hours as needed. Patient not taking: Reported on 01/24/2017 10/03/16   Evelina Dun A, FNP  umeclidinium bromide (INCRUSE ELLIPTA) 62.5 MCG/INH AEPB Inhale 1 puff into the lungs daily. Patient not taking: Reported on 01/24/2017 01/30/16   Sharion Balloon, FNP    Family History Family History  Problem Relation Age of Onset  . Heart attack Mother   . Stroke Mother   . Stroke Sister   . Diabetes Unknown        sister x3, and mother  . Lung disease Father        worked in [employer]  . Colon cancer Neg Hx   . Colon polyps Neg Hx     Social History Social History  Substance Use Topics  . Smoking status: Current Every Day Smoker    Packs/day: 0.25    Years: 45.00    Types: Cigarettes  . Smokeless tobacco: Never Used  . Alcohol use No     Allergies   Lipitor [atorvastatin] and Soma [carisoprodol]   Review of Systems Review of Systems    Musculoskeletal: Positive for back pain.  All other systems reviewed and are negative.    Physical Exam Updated Vital Signs BP (!) 182/90 (BP Location: Left Arm)   Pulse 85   Temp 98.1 F (36.7 C) (Oral)   Resp 18   Ht _0  (1.575 m)   Wt 40.8 kg (90 lb)   SpO2 100%   BMI 16.46 kg/m   Physical Exam  Constitutional: She is oriented to person, place, and time.  Uncomfortable   HENT:  Head: Normocephalic.  Mouth/Throat: Oropharynx is clear and  moist.  Eyes: Conjunctivae and EOM are normal. Pupils are equal, round, and reactive to light.  Neck: Normal range of motion. Neck supple.  Cardiovascular: Normal rate, regular rhythm and normal heart sounds.   Pulmonary/Chest: Effort normal and breath sounds normal. No respiratory distress. She has no wheezes. She has no rales.  Abdominal: Soft. Bowel sounds are normal. She exhibits no distension. There is no tenderness. There is no guarding.  Musculoskeletal:  Surgical scar healing well, no redness or drainage. Some swelling around it but no obvious deformity.   Neurological: She is alert and oriented to person, place, and time.  No saddle anesthesia. Nl strength and sensation lower extremities. Nl reflexes. Ambulate well.   Skin: Skin is warm.  Psychiatric: She has a normal mood and affect.  Nursing note and vitals reviewed.    ED Treatments / Results  Labs (all labs ordered are listed, but only abnormal results are displayed) Labs Reviewed  COMPREHENSIVE METABOLIC PANEL - Abnormal; Notable for the following:       Result Value   Glucose, Bld 131 (*)    Total Protein 5.7 (*)    Albumin 3.1 (*)    Total Bilirubin 0.2 (*)    All other components within normal limits  CBC WITH DIFFERENTIAL/PLATELET - Abnormal; Notable for the following:    Hemoglobin 11.9 (*)    All other components within normal limits  I-STAT CG4 LACTIC ACID, ED  I-STAT CG4 LACTIC ACID, ED    EKG  EKG Interpretation None       Radiology Ct  Lumbar Spine Wo Contrast  Result Date: 03/07/2017 CLINICAL DATA:  Abscess in the lumbar area near incision for back surgery EXAM: CT LUMBAR SPINE WITHOUT CONTRAST TECHNIQUE: Multidetector CT imaging of the lumbar spine was performed without intravenous contrast administration. Multiplanar CT image reconstructions were also generated. COMPARISON:  02/19/2017, 12/25/2016 FINDINGS: L1 incompletely included. Segmentation: Normal.  Lumbar numbering similar to MRI 12/25/2016. Alignment: Normal. Vertebrae: No fracture. Posterior decompression changes from L2 through L4. Paraspinal and other soft tissues: Nonspecific fluid in the posterior paraspinal soft tissues. No paravertebral soft tissue abnormality. Disc levels: Inter body disc devices at L2-L3 and L3-L4. Transpedicular screws present at L2, L3 and L4. Left pedicular screw at L2 appears position lateral to the pedicle. IMPRESSION: 1. Nonspecific fluid in the posterior paraspinal soft tissues. 2. PLIF changes from L2 through L4. Left fixating screw at L2 appears to be positioned lateral to the left pedicle. Electronically Signed   By: Donavan Foil M.D.   On: 03/07/2017 21:38    Procedures Procedures (including critical care time)  Medications Ordered in ED Medications  HYDROcodone-acetaminophen (NORCO/VICODIN) 5-325 MG per tablet 2 tablet (2 tablets Oral Given 03/07/17 2008)     Initial Impression / Assessment and Plan / ED Course  I have reviewed the triage vital signs and the nursing notes.  Pertinent labs & imaging results that were available during my care of the patient were reviewed by me and considered in my medical decision making (see chart for details).    YAZMINA PAREJA is a 66 y.o. female here with back pain and swelling around the surgical site. I think likely postop seroma. Neurovascular intact, no fever. Will get labs, CT lumbar spine. Will not need MRI currently.   9:58 PM Labs unremarkable. CT showed posterior paraspinal soft  tissues and screws in place. Recommend apply ice on it, refill short course of hydrocodone.    Final Clinical Impressions(s) / ED Diagnoses  Final diagnoses:  None    New Prescriptions New Prescriptions   No medications on file     Drenda Freeze, MD 03/07/17 2158

## 2017-03-07 NOTE — ED Notes (Signed)
Patient transported to CT 

## 2017-03-11 ENCOUNTER — Other Ambulatory Visit: Payer: Self-pay | Admitting: Family

## 2017-03-19 ENCOUNTER — Ambulatory Visit: Payer: Commercial Managed Care - HMO | Admitting: Family

## 2017-03-26 DIAGNOSIS — M5416 Radiculopathy, lumbar region: Secondary | ICD-10-CM | POA: Diagnosis not present

## 2017-04-25 NOTE — Addendum Note (Signed)
Addendum  created 04/25/17 1133 by Lyndle Herrlich, MD   Sign clinical note

## 2017-04-25 NOTE — Anesthesia Postprocedure Evaluation (Signed)
Anesthesia Post Note  Patient: Jamie [name]  Procedure(s) Performed: Procedure(s) (LRB): Lumbar Two-Three, Lumbar Three- Four Posterior lumbar interbody fusion (N/A)     Anesthesia Post Evaluation  Last Vitals:  Vitals:   02/05/17 0400 02/05/17 0807  BP: 137/81 (!) 147/72  Pulse: (!) 105 (!) 111  Resp: 18 20  Temp: 37.4 C 36.9 C    Last Pain:  Vitals:   02/05/17 0807  TempSrc: Oral  PainSc:                  Riccardo Dubin

## 2017-05-15 ENCOUNTER — Other Ambulatory Visit: Payer: Self-pay | Admitting: Family

## 2017-05-15 DIAGNOSIS — E1165 Type 2 diabetes mellitus with hyperglycemia: Secondary | ICD-10-CM

## 2017-05-15 DIAGNOSIS — I10 Essential (primary) hypertension: Secondary | ICD-10-CM

## 2017-05-16 NOTE — Telephone Encounter (Signed)
Last seen 10/03/16  [facility]

## 2017-05-16 NOTE — Telephone Encounter (Signed)
Send in enough for her to get through to her next appointment, patient likely needs to be seen.

## 2017-05-23 ENCOUNTER — Encounter: Payer: Self-pay | Admitting: Family

## 2017-05-23 ENCOUNTER — Ambulatory Visit (INDEPENDENT_AMBULATORY_CARE_PROVIDER_SITE_OTHER): Payer: Medicare HMO | Admitting: Family

## 2017-05-23 VITALS — BP 155/90 | HR 76 | Temp 97.9°F | Ht 62.0 in | Wt 92.6 lb

## 2017-05-23 DIAGNOSIS — E1165 Type 2 diabetes mellitus with hyperglycemia: Secondary | ICD-10-CM

## 2017-05-23 DIAGNOSIS — H9193 Unspecified hearing loss, bilateral: Secondary | ICD-10-CM | POA: Diagnosis not present

## 2017-05-23 DIAGNOSIS — J449 Chronic obstructive pulmonary disease, unspecified: Secondary | ICD-10-CM

## 2017-05-23 DIAGNOSIS — G8929 Other chronic pain: Secondary | ICD-10-CM | POA: Diagnosis not present

## 2017-05-23 DIAGNOSIS — M5441 Lumbago with sciatica, right side: Secondary | ICD-10-CM

## 2017-05-23 DIAGNOSIS — H6121 Impacted cerumen, right ear: Secondary | ICD-10-CM | POA: Diagnosis not present

## 2017-05-23 DIAGNOSIS — I10 Essential (primary) hypertension: Secondary | ICD-10-CM | POA: Diagnosis not present

## 2017-05-23 DIAGNOSIS — K219 Gastro-esophageal reflux disease without esophagitis: Secondary | ICD-10-CM | POA: Diagnosis not present

## 2017-05-23 DIAGNOSIS — E784 Other hyperlipidemia: Secondary | ICD-10-CM | POA: Diagnosis not present

## 2017-05-23 DIAGNOSIS — R636 Underweight: Secondary | ICD-10-CM

## 2017-05-23 DIAGNOSIS — F17200 Nicotine dependence, unspecified, uncomplicated: Secondary | ICD-10-CM | POA: Diagnosis not present

## 2017-05-23 DIAGNOSIS — E7849 Other hyperlipidemia: Secondary | ICD-10-CM

## 2017-05-23 LAB — BAYER DCA HB A1C WAIVED: HB A1C (BAYER DCA - WAIVED): 6.6 % (ref <7.0)

## 2017-05-23 NOTE — [place] Notes (Addendum)
Subjective:    Patient ID: Jamie [name], female    DOB: Aug 08, 1951, 66 y.o.   MRN: [id number]  Pt presents to the office today for chronic follow up. PT had surgical decompression and stabilization from L2-L4 on 02/05/17. Pt states her back continues to hurt, but her walking has improved.  Diabetes  She presents for her follow-up diabetic visit. She has type 2 diabetes mellitus. Her disease course has been stable. There are no hypoglycemic associated symptoms. Pertinent negatives for diabetes include no blurred vision, no foot paresthesias and no visual change. There are no hypoglycemic complications. Symptoms are stable. Pertinent negatives for diabetic complications include no CVA or heart disease. Risk factors for coronary artery disease include diabetes mellitus, dyslipidemia, post-menopausal and sedentary lifestyle. She is following a diabetic diet. Her breakfast blood glucose range is generally 130-140 mg/dl.  Gastroesophageal Reflux  She complains of a hoarse voice. She reports no belching, no coughing or no heartburn. This is a chronic problem. The problem occurs rarely. The problem has been resolved. She has tried an antacid for the symptoms. The treatment provided moderate relief.  Hypertension  This is a chronic problem. The current episode started more than 1 year ago. The problem has been resolved since onset. The problem is uncontrolled. Pertinent negatives include no blurred vision, malaise/fatigue, peripheral edema or shortness of breath. Risk factors for coronary artery disease include dyslipidemia and diabetes mellitus. The current treatment provides mild improvement. There is no history of kidney disease, CAD/MI or CVA.  Back Pain  This is a chronic problem. The current episode started more than 1 year ago. The problem occurs intermittently. The problem has been waxing and waning since onset. The pain is present in the lumbar spine. The quality of the pain is described as aching.  The pain is at a severity of 10/10. The pain is moderate. Pertinent negatives include no bladder incontinence or bowel incontinence. Risk factors include history of osteoporosis. She has tried NSAIDs for the symptoms. The treatment provided mild relief.  COPD PT smokes a pack a day. Using Symbicort BID and Incruse daily.    Review of Systems  Constitutional: Negative for malaise/fatigue.  HENT: Positive for hoarse voice.   Eyes: Negative for blurred vision.  Respiratory: Negative for cough and shortness of breath.   Gastrointestinal: Negative for bowel incontinence and heartburn.  Genitourinary: Negative for bladder incontinence.  Musculoskeletal: Positive for back pain.  All other systems reviewed and are negative.      Objective:   Physical Exam  Constitutional: She is oriented to person, place, and time. She appears well-developed and well-nourished. No distress.  HENT:  Head: Normocephalic and atraumatic.  Right Ear: External ear normal.  Left Ear: External ear normal.  Nose: Nose normal.  Mouth/Throat: Oropharynx is clear and moist.  Right ear cerumen impaction   Eyes: Pupils are equal, round, and reactive to light.  Neck: Normal range of motion. Neck supple. No thyromegaly present.  Cardiovascular: Normal rate, regular rhythm, normal heart sounds and intact distal pulses.   No murmur heard. Pulmonary/Chest: Effort normal. No respiratory distress. She has decreased breath sounds. She has wheezes.  Abdominal: Soft. Bowel sounds are normal. She exhibits no distension. There is no tenderness.  Musculoskeletal: Normal range of motion. She exhibits no edema or tenderness.  Neurological: She is alert and oriented to person, place, and time.  Skin: Skin is warm and dry.  Psychiatric: She has a normal mood and affect. Her behavior is normal. Judgment  and thought content normal.  Vitals reviewed.  Warm water and peroxided used- TM WNL   BP (!) 155/90   Pulse 76   Temp 97.9 F  (36.6 C) (Oral)   Ht _0  (1.575 m)   Wt 92 lb 9.6 oz (42 kg)   BMI 16.94 kg/m      Assessment & Plan:  1. Essential hypertension - CMP14+EGFR  2. Moderate COPD (chronic obstructive pulmonary disease) (HCC) - CMP14+EGFR  3. Gastroesophageal reflux disease without esophagitis - CMP14+EGFR  4. Type 2 diabetes mellitus with hyperglycemia, without long-term current use of insulin (HCC) - CMP14+EGFR - Bayer DCA Hb A1c Waived  5. Bilateral hearing loss, unspecified hearing loss type  - CMP14+EGFR  6. Underweight - CMP14+EGFR  7. Chronic bilateral low back pain with right-sided sciatica - CMP14+EGFR  8. Other hyperlipidemia  - CMP14+EGFR - Lipid panel  9. Smoking - CMP14+EGFR  10. Impacted cerumen of right ear   Continue all meds Labs pending Health Maintenance reviewed Diet and exercise encouraged RTO 6 months   Evelina Dun, FNP

## 2017-05-23 NOTE — Patient Instructions (Signed)

## 2017-05-24 LAB — CMP14+EGFR
ALBUMIN: 4.1 g/dL (ref 3.6–4.8)
ALT: 13 IU/L (ref 0–32)
AST: 19 IU/L (ref 0–40)
Albumin/Globulin Ratio: 1.8 (ref 1.2–2.2)
Alkaline Phosphatase: 118 IU/L — ABNORMAL HIGH (ref 39–117)
BILIRUBIN TOTAL: 0.3 mg/dL (ref 0.0–1.2)
BUN/Creatinine Ratio: 22 (ref 12–28)
BUN: 16 mg/dL (ref 8–27)
CALCIUM: 9.3 mg/dL (ref 8.7–10.3)
CO2: 23 mmol/L (ref 20–29)
CREATININE: 0.73 mg/dL (ref 0.57–1.00)
Chloride: 102 mmol/L (ref 96–106)
GFR calc Af Amer: 101 mL/min/{1.73_m2} (ref >59)
GFR calc non Af Amer: 87 mL/min/{1.73_m2} (ref >59)
GLUCOSE: 119 mg/dL — AB (ref 65–99)
Globulin, Total: 2.3 g/dL (ref 1.5–4.5)
POTASSIUM: 4.8 mmol/L (ref 3.5–5.2)
Sodium: 140 mmol/L (ref 134–144)
TOTAL PROTEIN: 6.4 g/dL (ref 6.0–8.5)

## 2017-05-24 LAB — LIPID PANEL
CHOL/HDL RATIO: 2.1 ratio (ref 0.0–4.4)
Cholesterol, Total: 116 mg/dL (ref 100–199)
HDL: 56 mg/dL (ref >39)
LDL Calculated: 50 mg/dL (ref 0–99)
Triglycerides: 51 mg/dL (ref 0–149)
VLDL CHOLESTEROL CAL: 10 mg/dL (ref 5–40)

## 2017-05-28 DIAGNOSIS — M5416 Radiculopathy, lumbar region: Secondary | ICD-10-CM | POA: Diagnosis not present

## 2017-06-24 DIAGNOSIS — Z981 Arthrodesis status: Secondary | ICD-10-CM | POA: Diagnosis not present

## 2017-06-24 DIAGNOSIS — M5416 Radiculopathy, lumbar region: Secondary | ICD-10-CM | POA: Diagnosis not present

## 2017-07-22 ENCOUNTER — Ambulatory Visit (INDEPENDENT_AMBULATORY_CARE_PROVIDER_SITE_OTHER): Payer: Medicare Other

## 2017-07-22 DIAGNOSIS — Z23 Encounter for immunization: Secondary | ICD-10-CM

## 2017-08-06 ENCOUNTER — Ambulatory Visit (INDEPENDENT_AMBULATORY_CARE_PROVIDER_SITE_OTHER): Payer: Medicare Other | Admitting: Family

## 2017-08-06 ENCOUNTER — Encounter: Payer: Self-pay | Admitting: Family

## 2017-08-06 VITALS — BP 119/77 | HR 92 | Temp 97.8°F | Ht 62.0 in | Wt 94.0 lb

## 2017-08-06 DIAGNOSIS — H9193 Unspecified hearing loss, bilateral: Secondary | ICD-10-CM | POA: Diagnosis not present

## 2017-08-06 DIAGNOSIS — F17200 Nicotine dependence, unspecified, uncomplicated: Secondary | ICD-10-CM | POA: Diagnosis not present

## 2017-08-06 DIAGNOSIS — E118 Type 2 diabetes mellitus with unspecified complications: Secondary | ICD-10-CM

## 2017-08-06 DIAGNOSIS — M5416 Radiculopathy, lumbar region: Secondary | ICD-10-CM

## 2017-08-06 DIAGNOSIS — I2584 Coronary atherosclerosis due to calcified coronary lesion: Secondary | ICD-10-CM | POA: Diagnosis not present

## 2017-08-06 DIAGNOSIS — M5441 Lumbago with sciatica, right side: Secondary | ICD-10-CM | POA: Diagnosis not present

## 2017-08-06 DIAGNOSIS — R911 Solitary pulmonary nodule: Secondary | ICD-10-CM

## 2017-08-06 DIAGNOSIS — I10 Essential (primary) hypertension: Secondary | ICD-10-CM

## 2017-08-06 DIAGNOSIS — E559 Vitamin D deficiency, unspecified: Secondary | ICD-10-CM | POA: Diagnosis not present

## 2017-08-06 DIAGNOSIS — R636 Underweight: Secondary | ICD-10-CM

## 2017-08-06 DIAGNOSIS — E1169 Type 2 diabetes mellitus with other specified complication: Secondary | ICD-10-CM

## 2017-08-06 DIAGNOSIS — K219 Gastro-esophageal reflux disease without esophagitis: Secondary | ICD-10-CM

## 2017-08-06 DIAGNOSIS — G8929 Other chronic pain: Secondary | ICD-10-CM

## 2017-08-06 DIAGNOSIS — J449 Chronic obstructive pulmonary disease, unspecified: Secondary | ICD-10-CM

## 2017-08-06 DIAGNOSIS — E785 Hyperlipidemia, unspecified: Secondary | ICD-10-CM | POA: Diagnosis not present

## 2017-08-06 DIAGNOSIS — E1165 Type 2 diabetes mellitus with hyperglycemia: Secondary | ICD-10-CM

## 2017-08-06 DIAGNOSIS — I2510 Atherosclerotic heart disease of native coronary artery without angina pectoris: Secondary | ICD-10-CM

## 2017-08-06 MED ORDER — TRAMADOL HCL 50 MG PO TABS: 50.0000 mg | ORAL_TABLET | Freq: Two times a day (BID) | ORAL | 1 refills | Status: DC | PRN

## 2017-08-06 MED ORDER — LOSARTAN POTASSIUM 100 MG PO TABS: 100.0000 mg | ORAL_TABLET | Freq: Every day | ORAL | 1 refills | Status: DC

## 2017-08-06 MED ORDER — AMLODIPINE BESYLATE 5 MG PO TABS: 5.0000 mg | ORAL_TABLET | Freq: Every day | ORAL | 1 refills | Status: DC

## 2017-08-06 MED ORDER — "INSULIN SYRINGE-NEEDLE U-100 31G X 5/16"" 0.5 ML MISC": 0 refills | Status: DC

## 2017-08-06 MED ORDER — DULAGLUTIDE 0.75 MG/0.5ML ~~LOC~~ SOAJ: 0.7500 mg | SUBCUTANEOUS | 2 refills | Status: DC

## 2017-08-06 MED ORDER — TIOTROPIUM BROMIDE MONOHYDRATE 18 MCG IN CAPS: 18.0000 ug | ORAL_CAPSULE | Freq: Every day | RESPIRATORY_TRACT | 1 refills | Status: DC

## 2017-08-06 MED ORDER — INSULIN NPH ISOPHANE & REGULAR (70-30) 100 UNIT/ML ~~LOC~~ SUSP: 10.0000 [IU] | Freq: Two times a day (BID) | SUBCUTANEOUS | 6 refills | Status: DC

## 2017-08-06 MED ORDER — BUDESONIDE-FORMOTEROL FUMARATE 160-4.5 MCG/ACT IN AERO: 2.0000 | INHALATION_SPRAY | Freq: Two times a day (BID) | RESPIRATORY_TRACT | 6 refills | Status: DC

## 2017-08-06 MED ORDER — PRAVASTATIN SODIUM 20 MG PO TABS: 20.0000 mg | ORAL_TABLET | Freq: Every day | ORAL | 1 refills | Status: DC

## 2017-08-06 NOTE — Addendum Note (Signed)
Addended by: Evelina Dun A on: 08/06/2017 11:45 AM   Modules accepted: Orders

## 2017-08-06 NOTE — [place] Notes (Signed)
Subjective:    Patient ID: Jamie [name], female    DOB: 03-10-1951, 66 y.o.   MRN: [id number]  Pt presents to the office today for chronic follow up. PT had surgical decompression and stabilization from L2-L4 on 02/05/17. Pt states her back continues to hurt and her pain is a 10 out 10.  Diabetes  She presents for her follow-up diabetic visit. She has type 2 diabetes mellitus. Her disease course has been stable. Associated symptoms include weight loss. Pertinent negatives for diabetes include no blurred vision, no foot paresthesias, no foot ulcerations and no visual change. There are no hypoglycemic complications. Symptoms are stable. Pertinent negatives for diabetic complications include no CVA, heart disease or peripheral neuropathy. Risk factors for coronary artery disease include diabetes mellitus, dyslipidemia, sedentary lifestyle and family history. Her weight is stable. She is following a diabetic diet. Her breakfast blood glucose range is generally 90-110 mg/dl.  Gastroesophageal Reflux  She complains of wheezing. She reports no belching, no coughing or no heartburn. This is a chronic problem. The current episode started more than 1 year ago. The problem occurs frequently. The problem has been waxing and waning. The symptoms are aggravated by certain foods. Associated symptoms include weight loss. She has tried a PPI for the symptoms. The treatment provided moderate relief.  Hyperlipidemia  This is a chronic problem. The current episode started more than 1 year ago. The problem is controlled. Recent lipid tests were reviewed and are normal. Factors aggravating her hyperlipidemia include smoking. Associated symptoms include shortness of breath. Current antihyperlipidemic treatment includes statins. The current treatment provides moderate improvement of lipids.  Back Pain  The current episode started more than 1 year ago. The problem occurs intermittently. The problem has been waxing and waning  since onset. The pain is present in the lumbar spine. The quality of the pain is described as aching. The pain is at a severity of 10/10. The pain is moderate. Associated symptoms include weight loss. Pertinent negatives include no bladder incontinence or bowel incontinence. She has tried bed rest and muscle relaxant for the symptoms. The treatment provided mild relief.  COPD PT takes Symbicort three times a week. Discussed taking every day!!!   Review of Systems  Constitutional: Positive for weight loss.  Eyes: Negative for blurred vision.  Respiratory: Positive for shortness of breath and wheezing. Negative for cough.   Gastrointestinal: Negative for bowel incontinence and heartburn.  Genitourinary: Negative for bladder incontinence.  Musculoskeletal: Positive for back pain.  All other systems reviewed and are negative.      Objective:   Physical Exam  Constitutional: She is oriented to person, place, and time. She appears well-developed and well-nourished. No distress.  HENT:  Head: Normocephalic and atraumatic.  Right Ear: External ear normal.  Left Ear: External ear normal.  Nose: Nose normal.  Mouth/Throat: Oropharynx is clear and moist.  Eyes: Pupils are equal, round, and reactive to light.  Neck: Normal range of motion. Neck supple. No thyromegaly present.  Cardiovascular: Normal rate, regular rhythm, normal heart sounds and intact distal pulses.   No murmur heard. Pulmonary/Chest: Effort normal. No respiratory distress. She has decreased breath sounds. She has wheezes.  Abdominal: Soft. Bowel sounds are normal. She exhibits no distension. There is no tenderness.  Musculoskeletal: Normal range of motion. She exhibits no edema or tenderness.  Neurological: She is alert and oriented to person, place, and time.  Skin: Skin is warm and dry.  Psychiatric: She has a normal mood and  affect. Her behavior is normal. Judgment and thought content normal.  Vitals reviewed.     BP  119/77   Pulse 92   Temp 97.8 F (36.6 C) (Oral)   Ht 5' 2" (1.575 m)   Wt 94 lb (42.6 kg)   SpO2 97%   BMI 17.19 kg/m      Assessment & Plan:  1. Diabetes mellitus with complication ([place]) - [id number]  2. Lumbar radiculopathy, chronic - traMADol (ULTRAM) 50 MG tablet; Take 1-2 tablets (50-100 mg total) by mouth every 12 (twelve) hours as needed.  Dispense: 120 tablet; Refill: 1 - CMP14+EGFR  3. Chronic bilateral low back pain with right-sided sciatica - traMADol (ULTRAM) 50 MG tablet; Take 1-2 tablets (50-100 mg total) by mouth every 12 (twelve) hours as needed.  Dispense: 120 tablet; Refill: 1 - CMP14+EGFR  4. Underweight - CMP14+EGFR  5. Hyperlipidemia associated with type 2 diabetes mellitus (HCC) - CMP14+EGFR  6. Coronary artery calcificatio - CMP14+EGFR  7. Moderate COPD (chronic obstructive pulmonary disease) (HCC) Will reorder Spiriva and Symbicort today Smoking cessation discussed - tiotropium (SPIRIVA HANDIHALER) 18 MCG inhalation capsule; Place 1 capsule (18 mcg total) into inhaler and inhale daily.  Dispense: 90 capsule; Refill: 1 - budesonide-formoterol (SYMBICORT) 160-4.5 MCG/ACT inhaler; Inhale 2 puffs into the lungs 2 (two) times daily.  Dispense: 1 Inhaler; Refill: 6 - CMP14+EGFR  8. Smoking - CMP14+EGFR  9. Bilateral hearing loss, unspecified hearing loss type - CMP14+EGFR  10. Gastroesophageal reflux disease without esophagitis - CMP14+EGFR  11. Vitamin D deficiency - CMP14+EGFR  12. Lung nodule, right upper lobe 8 mm March 2014  13. Solitary pulmonary nodule - CT Chest Wo Contrast; Future   Continue all meds Labs pending Health Maintenance reviewed Diet and exercise encouraged RTO 3 months   Evelina Dun, FNP

## 2017-08-06 NOTE — Patient Instructions (Signed)
Chronic Obstructive Pulmonary Disease Chronic obstructive pulmonary disease (COPD) is a common lung condition in which airflow from the lungs is limited. COPD is a general term that can be used to describe many different lung problems that limit airflow, including both chronic bronchitis and emphysema. If you have COPD, your lung function will probably never return to normal, but there are measures you can take to improve lung function and make yourself feel better. What are the causes?  Smoking (common).  Exposure to secondhand smoke.  Genetic problems.  Chronic inflammatory lung diseases or recurrent infections. What are the signs or symptoms?  Shortness of breath, especially with physical activity.  Deep, persistent (chronic) cough with a large amount of thick mucus.  Wheezing.  Rapid breaths (tachypnea).  Gray or bluish discoloration (cyanosis) of the skin, especially in your fingers, toes, or lips.  Fatigue.  Weight loss.  Frequent infections or episodes when breathing symptoms become much worse (exacerbations).  Chest tightness. How is this diagnosed? Your health care provider will take a medical history and perform a physical examination to diagnose COPD. Additional tests for COPD may include:  Lung (pulmonary) function tests.  Chest X-ray.  CT scan.  Blood tests. How is this treated? Treatment for COPD may include:  Inhaler and nebulizer medicines. These help manage the symptoms of COPD and make your breathing more comfortable.  Supplemental oxygen. Supplemental oxygen is only helpful if you have a low oxygen level in your blood.  Exercise and physical activity. These are beneficial for nearly all people with COPD.  Lung surgery or transplant.  Nutrition therapy to gain weight, if you are underweight.  Pulmonary rehabilitation. This may involve working with a team of health care providers and specialists, such as respiratory, occupational, and physical  therapists. Follow these instructions at home:  Take all medicines (inhaled or pills) as directed by your health care provider.  Avoid over-the-counter medicines or cough syrups that dry up your airway (such as antihistamines) and slow down the elimination of secretions unless instructed otherwise by your health care provider.  If you are a smoker, the most important thing that you can do is stop smoking. Continuing to smoke will cause further lung damage and breathing trouble. Ask your health care provider for help with quitting smoking. He or she can direct you to community resources or hospitals that provide support.  Avoid exposure to irritants such as smoke, chemicals, and fumes that aggravate your breathing.  Use oxygen therapy and pulmonary rehabilitation if directed by your health care provider. If you require home oxygen therapy, ask your health care provider whether you should purchase a pulse oximeter to measure your oxygen level at home.  Avoid contact with individuals who have a contagious illness.  Avoid extreme temperature and humidity changes.  Eat healthy foods. Eating smaller, more frequent meals and resting before meals may help you maintain your strength.  Stay active, but balance activity with periods of rest. Exercise and physical activity will help you maintain your ability to do things you want to do.  Preventing infection and hospitalization is very important when you have COPD. Make sure to receive all the vaccines your health care provider recommends, especially the pneumococcal and influenza vaccines. Ask your health care provider whether you need a pneumonia vaccine.  Learn and use relaxation techniques to manage stress.  Learn and use controlled breathing techniques as directed by your health care provider. Controlled breathing techniques include: 1. Pursed lip breathing. Start by breathing in (inhaling)   through your nose for 1 second. Then, purse your lips as  if you were going to whistle and breathe out (exhale) through the pursed lips for 2 seconds. 2. Diaphragmatic breathing. Start by putting one hand on your abdomen just above your waist. Inhale slowly through your nose. The hand on your abdomen should move out. Then purse your lips and exhale slowly. You should be able to feel the hand on your abdomen moving in as you exhale.  Learn and use controlled coughing to clear mucus from your lungs. Controlled coughing is a series of short, progressive coughs. The steps of controlled coughing are: 1. Lean your head slightly forward. 2. Breathe in deeply using diaphragmatic breathing. 3. Try to hold your breath for 3 seconds. 4. Keep your mouth slightly open while coughing twice. 5. Spit any mucus out into a tissue. 6. Rest and repeat the steps once or twice as needed. Contact a health care provider if:  You are coughing up more mucus than usual.  There is a change in the color or thickness of your mucus.  Your breathing is more labored than usual.  Your breathing is faster than usual. Get help right away if:  You have shortness of breath while you are resting.  You have shortness of breath that prevents you from:  Being able to talk.  Performing your usual physical activities.  You have chest pain lasting longer than 5 minutes.  Your skin color is more cyanotic than usual.  You measure low oxygen saturations for longer than 5 minutes with a pulse oximeter. This information is not intended to replace advice given to you by your health care provider. Make sure you discuss any questions you have with your health care provider. Document Released: 07/10/2005 Document Revised: 03/07/2016 Document Reviewed: 05/27/2013 Elsevier Interactive Patient Education  2017 [employer]  

## 2017-08-07 LAB — CMP14+EGFR
A/G RATIO: 1.6 (ref 1.2–2.2)
ALT: 11 IU/L (ref 0–32)
AST: 12 IU/L (ref 0–40)
Albumin: 3.7 g/dL (ref 3.6–4.8)
Alkaline Phosphatase: 98 IU/L (ref 39–117)
BUN/Creatinine Ratio: 17 (ref 12–28)
BUN: 15 mg/dL (ref 8–27)
Bilirubin Total: 0.2 mg/dL (ref 0.0–1.2)
CALCIUM: 9.4 mg/dL (ref 8.7–10.3)
CO2: 25 mmol/L (ref 20–29)
CREATININE: 0.86 mg/dL (ref 0.57–1.00)
Chloride: 103 mmol/L (ref 96–106)
GFR, EST AFRICAN AMERICAN: 83 mL/min/{1.73_m2} (ref >59)
GFR, EST NON AFRICAN AMERICAN: 72 mL/min/{1.73_m2} (ref >59)
Globulin, Total: 2.3 g/dL (ref 1.5–4.5)
Glucose: 123 mg/dL — ABNORMAL HIGH (ref 65–99)
POTASSIUM: 4.1 mmol/L (ref 3.5–5.2)
Sodium: 142 mmol/L (ref 134–144)
TOTAL PROTEIN: 6 g/dL (ref 6.0–8.5)

## 2017-08-20 ENCOUNTER — Ambulatory Visit (HOSPITAL_COMMUNITY): Payer: Medicare Other

## 2017-10-29 ENCOUNTER — Encounter: Payer: Self-pay | Admitting: *Deleted

## 2017-10-30 ENCOUNTER — Ambulatory Visit (INDEPENDENT_AMBULATORY_CARE_PROVIDER_SITE_OTHER): Payer: Medicare HMO | Admitting: Family

## 2017-10-30 ENCOUNTER — Encounter: Payer: Self-pay | Admitting: Family

## 2017-10-30 VITALS — BP 125/77 | HR 89 | Temp 97.4°F | Ht 62.0 in | Wt 96.2 lb

## 2017-10-30 DIAGNOSIS — E118 Type 2 diabetes mellitus with unspecified complications: Secondary | ICD-10-CM

## 2017-10-30 DIAGNOSIS — M5441 Lumbago with sciatica, right side: Secondary | ICD-10-CM | POA: Diagnosis not present

## 2017-10-30 DIAGNOSIS — E785 Hyperlipidemia, unspecified: Secondary | ICD-10-CM

## 2017-10-30 DIAGNOSIS — J449 Chronic obstructive pulmonary disease, unspecified: Secondary | ICD-10-CM

## 2017-10-30 DIAGNOSIS — E1169 Type 2 diabetes mellitus with other specified complication: Secondary | ICD-10-CM | POA: Diagnosis not present

## 2017-10-30 DIAGNOSIS — R636 Underweight: Secondary | ICD-10-CM | POA: Diagnosis not present

## 2017-10-30 DIAGNOSIS — M5416 Radiculopathy, lumbar region: Secondary | ICD-10-CM

## 2017-10-30 DIAGNOSIS — I10 Essential (primary) hypertension: Secondary | ICD-10-CM

## 2017-10-30 DIAGNOSIS — K219 Gastro-esophageal reflux disease without esophagitis: Secondary | ICD-10-CM | POA: Diagnosis not present

## 2017-10-30 DIAGNOSIS — G8929 Other chronic pain: Secondary | ICD-10-CM | POA: Diagnosis not present

## 2017-10-30 LAB — BAYER DCA HB A1C WAIVED: HB A1C (BAYER DCA - WAIVED): 7.1 % — ABNORMAL HIGH (ref <7.0)

## 2017-10-30 MED ORDER — AMLODIPINE BESYLATE 5 MG PO TABS: 5.0000 mg | ORAL_TABLET | Freq: Every day | ORAL | 1 refills | Status: DC

## 2017-10-30 MED ORDER — PRAVASTATIN SODIUM 20 MG PO TABS: 20.0000 mg | ORAL_TABLET | Freq: Every day | ORAL | 1 refills | Status: DC

## 2017-10-30 MED ORDER — TIOTROPIUM BROMIDE MONOHYDRATE 18 MCG IN CAPS: 18.0000 ug | ORAL_CAPSULE | Freq: Every day | RESPIRATORY_TRACT | 1 refills | Status: DC

## 2017-10-30 MED ORDER — INSULIN NPH ISOPHANE & REGULAR (70-30) 100 UNIT/ML ~~LOC~~ SUSP: 30.0000 [IU] | Freq: Every day | SUBCUTANEOUS | 6 refills | Status: DC

## 2017-10-30 MED ORDER — BUDESONIDE-FORMOTEROL FUMARATE 160-4.5 MCG/ACT IN AERO: 2.0000 | INHALATION_SPRAY | Freq: Two times a day (BID) | RESPIRATORY_TRACT | 6 refills | Status: DC

## 2017-10-30 MED ORDER — LOSARTAN POTASSIUM 100 MG PO TABS: 100.0000 mg | ORAL_TABLET | Freq: Every day | ORAL | 1 refills | Status: DC

## 2017-10-30 MED ORDER — TRAMADOL HCL 50 MG PO TABS: 50.0000 mg | ORAL_TABLET | Freq: Two times a day (BID) | ORAL | 1 refills | Status: DC | PRN

## 2017-10-30 NOTE — Patient Instructions (Signed)

## 2017-10-30 NOTE — Progress Notes (Signed)
Subjective:    Patient ID: Jamie [name], female    DOB: [birth year], 67 y.o.   MRN: [id number]  Pt presents to the office today for chronic follow up. PT had surgical decompression and stabilization from L2-L4 on 02/05/17. Pt states her back continues to hurt and her pain is a 8 out 10 when she walks or standing.  Diabetes  She presents for her follow-up diabetic visit. She has type 2 diabetes mellitus. Her disease course has been stable. There are no hypoglycemic associated symptoms. There are no diabetic associated symptoms. Pertinent negatives for diabetes include no blurred vision, no foot paresthesias and no visual change. There are no hypoglycemic complications. Symptoms are stable. Pertinent negatives for diabetic complications include no CVA. Risk factors for coronary artery disease include diabetes mellitus, dyslipidemia, hypertension, stress and sedentary lifestyle. She is following a generally healthy diet. Her breakfast blood glucose range is generally 110-130 mg/dl.  Hyperlipidemia  This is a chronic problem. The current episode started more than 1 year ago. The problem is controlled. Recent lipid tests were reviewed and are normal. Associated symptoms include shortness of breath. Current antihyperlipidemic treatment includes statins. The current treatment provides moderate improvement of lipids. Risk factors for coronary artery disease include diabetes mellitus, dyslipidemia, post-menopausal and a sedentary lifestyle.  Gastroesophageal Reflux  She reports no belching, no coughing or no heartburn. This is a chronic problem. The current episode started more than 1 year ago. The problem has been resolved. The symptoms are aggravated by smoking and medications. Risk factors include obesity. She has tried an antacid for the symptoms. The treatment provided moderate relief.  Back Pain  This is a chronic problem. The current episode started more than 1 year ago. The problem occurs  intermittently. The problem has been waxing and waning since onset. The pain is present in the lumbar spine. The pain is at a severity of 8/10. The pain is moderate.  COPD  PT taking Symbicort BID and Spiriva. PT continues to smoke 1 pack a day.     Review of Systems  Eyes: Negative for blurred vision.  Respiratory: Positive for shortness of breath. Negative for cough.   Gastrointestinal: Negative for heartburn.  Musculoskeletal: Positive for back pain.  All other systems reviewed and are negative.      Objective:   Physical Exam  Constitutional: She is oriented to person, place, and time. She appears well-developed and well-nourished. No distress.  HENT:  Head: Normocephalic and atraumatic.  Right Ear: External ear normal.  Mouth/Throat: Oropharynx is clear and moist.  HOH   Eyes: Pupils are equal, round, and reactive to light.  Neck: Normal range of motion. Neck supple. No thyromegaly present.  Cardiovascular: Normal rate, regular rhythm, normal heart sounds and intact distal pulses.  No murmur heard. Pulmonary/Chest: Effort normal and breath sounds normal. No respiratory distress. She has no wheezes.  Abdominal: Soft. Bowel sounds are normal. She exhibits no distension. There is no tenderness.  Musculoskeletal: She exhibits tenderness. She exhibits no edema.  Neurological: She is alert and oriented to person, place, and time.  Skin: Skin is warm and dry.  Psychiatric: She has a normal mood and affect. Her behavior is normal. Judgment and thought content normal.  Vitals reviewed.    BP (!) 161/82   Pulse 80   Temp (!) 97.4 F (36.3 C) (Oral)   Ht '5\' 2"'  (1.575 m)   Wt 96 lb 3.2 oz (43.6 kg)   BMI 17.60 kg/m  Assessment & Plan:  1. Diabetes mellitus with complication (HCC) Will decrease even insulin dose since pt having glucose dropping in the evening Low carb diet  - insulin NPH-regular Human (NOVOLIN 70/30) (70-30) 100 UNIT/ML injection; Inject 30 Units into  the skin daily with breakfast. 30 units in AM and 10 units in PM  Dispense: 10 mL; Refill: 6 - Bayer DCA Hb A1c Waived - CMP14+EGFR  2. Hyperlipidemia associated with type 2 diabetes mellitus (HCC) - CMP14+EGFR - Lipid panel  3. Moderate COPD (chronic obstructive pulmonary disease) (HCC) Smoking cessation discussed - tiotropium (SPIRIVA HANDIHALER) 18 MCG inhalation capsule; Place 1 capsule (18 mcg total) into inhaler and inhale daily.  Dispense: 90 capsule; Refill: 1 - budesonide-formoterol (SYMBICORT) 160-4.5 MCG/ACT inhaler; Inhale 2 puffs into the lungs 2 (two) times daily.  Dispense: 1 Inhaler; Refill: 6 - CMP14+EGFR  4. Gastroesophageal reflux disease without esophagitis - CMP14+EGFR  5. Underweight - CMP14+EGFR  6. Chronic bilateral low back pain with right-sided sciatica - traMADol (ULTRAM) 50 MG tablet; Take 1-2 tablets (50-100 mg total) by mouth every 12 (twelve) hours as needed.  Dispense: 120 tablet; Refill: 1 - CMP14+EGFR  7. Essential hypertension - losartan (COZAAR) 100 MG tablet; Take 1 tablet (100 mg total) by mouth daily.  Dispense: 90 tablet; Refill: 1 - amLODipine (NORVASC) 5 MG tablet; Take 1 tablet (5 mg total) by mouth daily.  Dispense: 90 tablet; Refill: 1 - CMP14+EGFR  8. Lumbar radiculopathy, chronic Keep follow up with [facility]  - traMADol (ULTRAM) 50 MG tablet; Take 1-2 tablets (50-100 mg total) by mouth every 12 (twelve) hours as needed.  Dispense: 120 tablet; Refill: 1 - CMP14+EGFR   Continue all meds Labs pending Health Maintenance reviewed Diet and exercise encouraged RTO 3 months   Evelina Dun, FNP

## 2017-10-31 LAB — CMP14+EGFR
A/G RATIO: 1.5 (ref 1.2–2.2)
ALT: 11 IU/L (ref 0–32)
AST: 14 IU/L (ref 0–40)
Albumin: 3.8 g/dL (ref 3.6–4.8)
Alkaline Phosphatase: 105 IU/L (ref 39–117)
BUN/Creatinine Ratio: 12 (ref 12–28)
BUN: 11 mg/dL (ref 8–27)
Bilirubin Total: 0.2 mg/dL (ref 0.0–1.2)
CO2: 24 mmol/L (ref 20–29)
Calcium: 9.3 mg/dL (ref 8.7–10.3)
Chloride: 101 mmol/L (ref 96–106)
Creatinine, Ser: 0.9 mg/dL (ref 0.57–1.00)
GFR calc Af Amer: 78 mL/min/{1.73_m2} (ref >59)
GFR calc non Af Amer: 67 mL/min/{1.73_m2} (ref >59)
GLOBULIN, TOTAL: 2.5 g/dL (ref 1.5–4.5)
Glucose: 187 mg/dL — ABNORMAL HIGH (ref 65–99)
POTASSIUM: 4 mmol/L (ref 3.5–5.2)
SODIUM: 142 mmol/L (ref 134–144)
Total Protein: 6.3 g/dL (ref 6.0–8.5)

## 2017-11-25 ENCOUNTER — Ambulatory Visit: Payer: Medicare HMO | Admitting: Family

## 2017-11-26 ENCOUNTER — Ambulatory Visit: Payer: Medicare Other | Admitting: Family

## 2017-12-02 ENCOUNTER — Other Ambulatory Visit: Payer: Self-pay | Admitting: Family Medicine

## 2017-12-02 DIAGNOSIS — E1165 Type 2 diabetes mellitus with hyperglycemia: Secondary | ICD-10-CM

## 2018-01-22 DIAGNOSIS — Z794 Long term (current) use of insulin: Secondary | ICD-10-CM | POA: Diagnosis not present

## 2018-01-22 DIAGNOSIS — E119 Type 2 diabetes mellitus without complications: Secondary | ICD-10-CM | POA: Diagnosis not present

## 2018-01-22 DIAGNOSIS — H35372 Puckering of macula, left eye: Secondary | ICD-10-CM | POA: Diagnosis not present

## 2018-01-22 DIAGNOSIS — H26492 Other secondary cataract, left eye: Secondary | ICD-10-CM | POA: Diagnosis not present

## 2018-01-22 LAB — HM DIABETES EYE EXAM

## 2018-01-29 ENCOUNTER — Encounter: Payer: Self-pay | Admitting: Family

## 2018-01-29 ENCOUNTER — Ambulatory Visit (INDEPENDENT_AMBULATORY_CARE_PROVIDER_SITE_OTHER): Payer: Medicare HMO | Admitting: Family

## 2018-01-29 VITALS — BP 133/79 | HR 95 | Temp 97.6°F | Ht 62.0 in | Wt 98.8 lb

## 2018-01-29 DIAGNOSIS — I2510 Atherosclerotic heart disease of native coronary artery without angina pectoris: Secondary | ICD-10-CM | POA: Diagnosis not present

## 2018-01-29 DIAGNOSIS — J449 Chronic obstructive pulmonary disease, unspecified: Secondary | ICD-10-CM

## 2018-01-29 DIAGNOSIS — M5441 Lumbago with sciatica, right side: Secondary | ICD-10-CM

## 2018-01-29 DIAGNOSIS — E559 Vitamin D deficiency, unspecified: Secondary | ICD-10-CM

## 2018-01-29 DIAGNOSIS — I2584 Coronary atherosclerosis due to calcified coronary lesion: Secondary | ICD-10-CM

## 2018-01-29 DIAGNOSIS — E118 Type 2 diabetes mellitus with unspecified complications: Secondary | ICD-10-CM

## 2018-01-29 DIAGNOSIS — E785 Hyperlipidemia, unspecified: Secondary | ICD-10-CM | POA: Diagnosis not present

## 2018-01-29 DIAGNOSIS — Z1212 Encounter for screening for malignant neoplasm of rectum: Secondary | ICD-10-CM

## 2018-01-29 DIAGNOSIS — E1169 Type 2 diabetes mellitus with other specified complication: Secondary | ICD-10-CM | POA: Diagnosis not present

## 2018-01-29 DIAGNOSIS — Z1211 Encounter for screening for malignant neoplasm of colon: Secondary | ICD-10-CM

## 2018-01-29 DIAGNOSIS — Z0000 Encounter for general adult medical examination without abnormal findings: Secondary | ICD-10-CM | POA: Diagnosis not present

## 2018-01-29 DIAGNOSIS — G8929 Other chronic pain: Secondary | ICD-10-CM

## 2018-01-29 DIAGNOSIS — Z23 Encounter for immunization: Secondary | ICD-10-CM

## 2018-01-29 DIAGNOSIS — R636 Underweight: Secondary | ICD-10-CM

## 2018-01-29 DIAGNOSIS — H9193 Unspecified hearing loss, bilateral: Secondary | ICD-10-CM

## 2018-01-29 DIAGNOSIS — F17200 Nicotine dependence, unspecified, uncomplicated: Secondary | ICD-10-CM

## 2018-01-29 DIAGNOSIS — IMO0001 Reserved for inherently not codable concepts without codable children: Secondary | ICD-10-CM

## 2018-01-29 DIAGNOSIS — Z01419 Encounter for gynecological examination (general) (routine) without abnormal findings: Secondary | ICD-10-CM

## 2018-01-29 DIAGNOSIS — M5416 Radiculopathy, lumbar region: Secondary | ICD-10-CM

## 2018-01-29 DIAGNOSIS — N63 Unspecified lump in unspecified breast: Secondary | ICD-10-CM

## 2018-01-29 DIAGNOSIS — K219 Gastro-esophageal reflux disease without esophagitis: Secondary | ICD-10-CM | POA: Diagnosis not present

## 2018-01-29 LAB — URINALYSIS, COMPLETE
BILIRUBIN UA: NEGATIVE
GLUCOSE, UA: NEGATIVE
Ketones, UA: NEGATIVE
LEUKOCYTES UA: NEGATIVE
Nitrite, UA: NEGATIVE
PH UA: 5 (ref 5.0–7.5)
PROTEIN UA: NEGATIVE
Specific Gravity, UA: 1.015 (ref 1.005–1.030)
Urobilinogen, Ur: 0.2 mg/dL (ref 0.2–1.0)

## 2018-01-29 LAB — MICROSCOPIC EXAMINATION

## 2018-01-29 LAB — BAYER DCA HB A1C WAIVED: HB A1C (BAYER DCA - WAIVED): 6.4 % (ref <7.0)

## 2018-01-29 NOTE — Progress Notes (Signed)
Subjective:    Patient ID: Jamie [name], female    DOB: 01/07/1951, 67 y.o.   MRN: [id number]  Pt presents to the office today for chronic follow up. PT had surgical decompression and stabilization from L2-L4 on 02/05/17. Pt states her back continues to hurtand her pain is a 10 out 10 when she walks or standing.  Gynecologic Exam  The patient's pertinent negatives include no genital lesions, missed menses or vaginal discharge. The patient is experiencing no pain. Associated symptoms include back pain. Pertinent negatives include no nausea.  Diabetes  She presents for her follow-up diabetic visit. She has type 2 diabetes mellitus. Her disease course has been stable. There are no hypoglycemic associated symptoms. Associated symptoms include blurred vision, foot paresthesias and visual change. There are no hypoglycemic complications. Symptoms are worsening. Diabetic complications include peripheral neuropathy. Risk factors for coronary artery disease include diabetes mellitus, dyslipidemia, hypertension, sedentary lifestyle and tobacco exposure. She is following a generally healthy diet. Her overall blood glucose range is 90-110 mg/dl. Eye exam is current.  Hypertension  This is a chronic problem. The current episode started more than 1 year ago. The problem has been resolved since onset. The problem is controlled. Associated symptoms include blurred vision. Pertinent negatives include no malaise/fatigue, peripheral edema or shortness of breath. Risk factors for coronary artery disease include diabetes mellitus, dyslipidemia and smoking/tobacco exposure. The current treatment provides moderate improvement. There is no history of kidney disease.  Hyperlipidemia  This is a chronic problem. The current episode started more than 1 year ago. The problem is controlled. Recent lipid tests were reviewed and are normal. Pertinent negatives include no shortness of breath. Current antihyperlipidemic treatment  includes statins. The current treatment provides moderate improvement of lipids. Risk factors for coronary artery disease include diabetes mellitus, dyslipidemia and hypertension.  Gastroesophageal Reflux  She reports no belching, no heartburn or no nausea. This is a chronic problem. The current episode started more than 1 year ago. The problem has been resolved. The symptoms are aggravated by certain foods. She has tried a PPI for the symptoms. The treatment provided moderate relief.  COPD PT currently smoking a pack a day. States she is not planning on quitting.    Review of Systems  Constitutional: Negative for malaise/fatigue.  Eyes: Positive for blurred vision.  Respiratory: Negative for shortness of breath.   Gastrointestinal: Negative for heartburn and nausea.  Genitourinary: Negative for missed menses and vaginal discharge.  Musculoskeletal: Positive for back pain.  All other systems reviewed and are negative.      Objective:   Physical Exam  Constitutional: She is oriented to person, place, and time. She appears well-developed and well-nourished. No distress.  HENT:  Head: Normocephalic and atraumatic.  Right Ear: External ear normal.  Left Ear: External ear normal.  Nose: Nose normal.  Mouth/Throat: Oropharynx is clear and moist.  HOH  Eyes: Pupils are equal, round, and reactive to light.  Neck: Normal range of motion. Neck supple. No thyromegaly present.  Cardiovascular: Normal rate, regular rhythm, normal heart sounds and intact distal pulses.  No murmur heard. Pulmonary/Chest: Effort normal and breath sounds normal. No respiratory distress. She has no wheezes. Right breast exhibits no inverted nipple, no mass, no nipple discharge, no skin change and no tenderness. Left breast exhibits inverted nipple and mass. Left breast exhibits no nipple discharge, no skin change and no tenderness.  Abdominal: Soft. Bowel sounds are normal. She exhibits no distension. There is no  tenderness.  Genitourinary: Vagina normal.  Genitourinary Comments: Bimanual exam- no adnexal masses or tenderness, ovaries nonpalpable   Cervix parous and pink- No discharge   Musculoskeletal: Normal range of motion. She exhibits no edema or tenderness.  Neurological: She is alert and oriented to person, place, and time.  Skin: Skin is warm and dry.  Psychiatric: She has a normal mood and affect. Her behavior is normal. Judgment and thought content normal.  Vitals reviewed.  Diabetic Foot Exam - Simple   Simple Foot Form Diabetic Foot exam was performed with the following findings:  Yes 01/29/2018  3:27 PM  Visual Inspection No deformities, no ulcerations, no other skin breakdown bilaterally:  Yes Sensation Testing Intact to touch and monofilament testing bilaterally:  Yes Pulse Check Posterior Tibialis and Dorsalis pulse intact bilaterally:  Yes Comments       BP 133/79   Pulse 95   Temp 97.6 F (36.4 C) (Oral)   Ht _0  (1.575 m)   Wt 98 lb 12.8 oz (44.8 kg)   BMI 18.07 kg/m      Assessment & Plan:  1. Annual physical exam - Bayer DCA Hb A1c Waived - CMP14+EGFR - Lipid panel - Microalbumin / creatinine urine ratio - TSH - VITAMIN D 25 Hydroxy (Vit-D Deficiency, Fractures)  2. Normal gynecologic examination - Urinalysis, Complete - CMP14+EGFR - Pap IG (Image Guided)  3. Coronary artery calcification - CMP14+EGFR  4. Moderate COPD (chronic obstructive pulmonary disease) (HCC) - CMP14+EGFR  5. Gastroesophageal reflux disease without esophagitis - CMP14+EGFR  6. Hyperlipidemia associated with type 2 diabetes mellitus (HCC) - CMP14+EGFR - Lipid panel  7. Diabetes mellitus with complication (HCC) - RYR88+BBHQ - Microalbumin / creatinine urine ratio  8. Vitamin D deficiency - CMP14+EGFR - VITAMIN D 25 Hydroxy (Vit-D Deficiency, Fractures)  9. Chronic bilateral low back pain with right-sided sciatica - CMP14+EGFR  10. Smoking - CMP14+EGFR  11.  Underweight - CMP14+EGFR  12. Lumbar radiculopathy, chronic - CMP14+EGFR  13. Bilateral hearing loss, unspecified hearing loss type - CMP14+EGFR  14. Colon cancer screening - CMP14+EGFR - Cologuard  15. Screening for malignant neoplasm of the rectum - CMP14+EGFR - Cologuard  16. Breast nodule - MM DIAG BREAST TOMO BILATERAL; Future - US BREAST LTD UNI LEFT INC AXILLA; Future   Continue all meds Labs pending Health Maintenance reviewed Diet and exercise encouraged RTO 3 months   Evelina Dun, FNP

## 2018-01-29 NOTE — Patient Instructions (Signed)

## 2018-01-29 NOTE — Addendum Note (Signed)
Addended by: Shelbie Ammons on: 01/29/2018 03:53 PM   Modules accepted: Orders

## 2018-01-30 ENCOUNTER — Telehealth: Payer: Self-pay | Admitting: Family Medicine

## 2018-01-30 ENCOUNTER — Telehealth: Payer: Self-pay | Admitting: Clinical

## 2018-01-30 LAB — LIPID PANEL
Chol/HDL Ratio: 2.5 ratio (ref 0.0–4.4)
Cholesterol, Total: 154 mg/dL (ref 100–199)
HDL: 62 mg/dL (ref >39)
LDL Calculated: 78 mg/dL (ref 0–99)
Triglycerides: 72 mg/dL (ref 0–149)
VLDL Cholesterol Cal: 14 mg/dL (ref 5–40)

## 2018-01-30 LAB — CMP14+EGFR
A/G RATIO: 1.7 (ref 1.2–2.2)
ALBUMIN: 4 g/dL (ref 3.6–4.8)
ALK PHOS: 98 IU/L (ref 39–117)
ALT: 13 IU/L (ref 0–32)
AST: 15 IU/L (ref 0–40)
BUN / CREAT RATIO: 15 (ref 12–28)
BUN: 15 mg/dL (ref 8–27)
CHLORIDE: 101 mmol/L (ref 96–106)
CO2: 26 mmol/L (ref 20–29)
Calcium: 9.5 mg/dL (ref 8.7–10.3)
Creatinine, Ser: 0.99 mg/dL (ref 0.57–1.00)
GFR calc non Af Amer: 60 mL/min/{1.73_m2} (ref >59)
GFR, EST AFRICAN AMERICAN: 69 mL/min/{1.73_m2} (ref >59)
Globulin, Total: 2.4 g/dL (ref 1.5–4.5)
Glucose: 29 mg/dL — CL (ref 65–99)
POTASSIUM: 3.9 mmol/L (ref 3.5–5.2)
SODIUM: 141 mmol/L (ref 134–144)
TOTAL PROTEIN: 6.4 g/dL (ref 6.0–8.5)

## 2018-01-30 LAB — MICROALBUMIN / CREATININE URINE RATIO: CREATININE, UR: 101.2 mg/dL

## 2018-01-30 LAB — TSH: TSH: 0.651 u[IU]/mL (ref 0.450–4.500)

## 2018-01-30 LAB — VITAMIN D 25 HYDROXY (VIT D DEFICIENCY, FRACTURES): VIT D 25 HYDROXY: 20.8 ng/mL — AB (ref 30.0–100.0)

## 2018-01-30 NOTE — Telephone Encounter (Signed)
Called for critical lab- Phone line is busy.   Will attempt again.   Glucose 29, but she was mentating normally and did not have any symptoms at her OV per documentation.   Although this is an alarming finding she has likely recovered ( and her phone line is busy). Will notify PCP and attempt call again.   Laroy Apple, MD [facility] 01/30/2018, 9:53 AM

## 2018-01-30 NOTE — Telephone Encounter (Signed)
This [place] Specialist introduced herself and offered [facility] services. Jamie [name] agreed to [facility] services and asked that she is called on Monday anytime.  This [facility] assessed for any current SI, Jamie [name] denied any SI today.  Jamie [name] reported they are celebrating her husband's birthday today.  This [facility] also informed her to call the primary care office at [phone] since MD was trying to follow up with her regarding her labs from yesterday.  Jamie [name] acknowledged understanding and will call PCP.

## 2018-01-31 LAB — PAP IG (IMAGE GUIDED): PAP Smear Comment: 0

## 2018-02-02 ENCOUNTER — Telehealth: Payer: Self-pay | Admitting: Clinical

## 2018-02-02 ENCOUNTER — Encounter (HOSPITAL_COMMUNITY): Payer: Self-pay | Admitting: Psychiatry

## 2018-02-02 ENCOUNTER — Other Ambulatory Visit: Payer: Self-pay | Admitting: Family

## 2018-02-02 ENCOUNTER — Telehealth (INDEPENDENT_AMBULATORY_CARE_PROVIDER_SITE_OTHER): Payer: Medicare HMO | Admitting: Clinical

## 2018-02-02 ENCOUNTER — Other Ambulatory Visit: Payer: Medicare Other

## 2018-02-02 DIAGNOSIS — F4321 Adjustment disorder with depressed mood: Secondary | ICD-10-CM
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
## 2018-02-02 NOTE — Telephone Encounter (Signed)
This [facility] Specialist tried to complete initial assessment for [place] services. No answer on both numbers. This [facility] specialist left message to call back with name and contact information.

## 2018-02-04 ENCOUNTER — Ambulatory Visit
Admission: RE | Admit: 2018-02-04 | Discharge: 2018-02-04 | Disposition: A | Discharge status: Home / Self Care | Payer: Medicare HMO | Source: Ambulatory Visit | Attending: Family | Admitting: Family

## 2018-02-04 DIAGNOSIS — H26492 Other secondary cataract, left eye: Secondary | ICD-10-CM | POA: Diagnosis not present

## 2018-02-04 DIAGNOSIS — N63 Unspecified lump in unspecified breast: Secondary | ICD-10-CM

## 2018-02-04 DIAGNOSIS — R922 Inconclusive mammogram: Secondary | ICD-10-CM | POA: Diagnosis not present

## 2018-02-04 DIAGNOSIS — H35372 Puckering of macula, left eye: Secondary | ICD-10-CM | POA: Diagnosis not present

## 2018-02-04 DIAGNOSIS — N6489 Other specified disorders of breast: Secondary | ICD-10-CM | POA: Diagnosis not present

## 2018-02-04 HISTORY — DX: Personal history of irradiation: Z92.3

## 2018-02-04 NOTE — Telephone Encounter (Signed)
This encounter was created in error - please disregard.
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
## 2018-02-05 ENCOUNTER — Telehealth: Payer: Self-pay | Admitting: Clinical

## 2018-02-05 MED ORDER — SERTRALINE HCL 25 MG PO TABS: ORAL_TABLET | ORAL | 0 refills | Status: DC

## 2018-02-05 NOTE — Telephone Encounter (Signed)
Will send in zoloft Prescription sent to pharmacy. Tamper after two weeks.
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
## 2018-02-05 NOTE — Telephone Encounter (Signed)
TC to C. Hawks, pt's PCP to discuss [employer] Health recommendations for patient.  C. Hawks agreed to medication management and [facility] Specialist to provide services for patient.   Recommendations by Dr. Modesta Messing - Psychiatrist (Please refer to Dr. Ivor Reining notes for details) - Start sertraline 25 mg daily. Consider uptitration to 50 mg after two weeks (targeted maximum dose: 100-150 mg daily) if no improvement in her symptoms.  - BH specialist to work on behavioral activation

## 2018-02-05 NOTE — Addendum Note (Signed)
Addended by: Evelina Dun A on: 02/05/2018 12:45 PM   Modules accepted: Orders

## 2018-02-05 NOTE — Telephone Encounter (Signed)
TC to [name] and was informed  that C. Hawks, FNP will prescribe her medication for depression.  [name] acknowledged understanding and will plan on picking up the medication.   [name] reported ongoing family stressors with pt's siblings and ongoing back pain. [name] completed her mammogram yesterday and went to her eye appointment.  She was able to do some chores today.  Plan: [name] will think of activities that she enjoys before our next phone encounter.   Jasmine P. [employer] Specialist

## 2018-02-09 ENCOUNTER — Telehealth: Payer: Self-pay | Admitting: Family

## 2018-02-09 ENCOUNTER — Other Ambulatory Visit: Payer: Self-pay | Admitting: *Deleted

## 2018-02-09 MED ORDER — VITAMIN D (ERGOCALCIFEROL) 1.25 MG (50000 UNIT) PO CAPS: 50000.0000 [IU] | ORAL_CAPSULE | ORAL | 1 refills | Status: DC

## 2018-02-09 NOTE — Telephone Encounter (Signed)
Aware. Vitamin D script sent to [place].

## 2018-02-21 IMAGING — CR DG LUMBAR SPINE 1V
1 series · 1 of 1 positions shown · non-contrast
Comparison: MRI December 25, 2016 and x-ray of January 08, 2017

CLINICAL DATA: L2-3, L3-4 fusion

EXAM:
LUMBAR SPINE - 1 VIEW

[lateral]
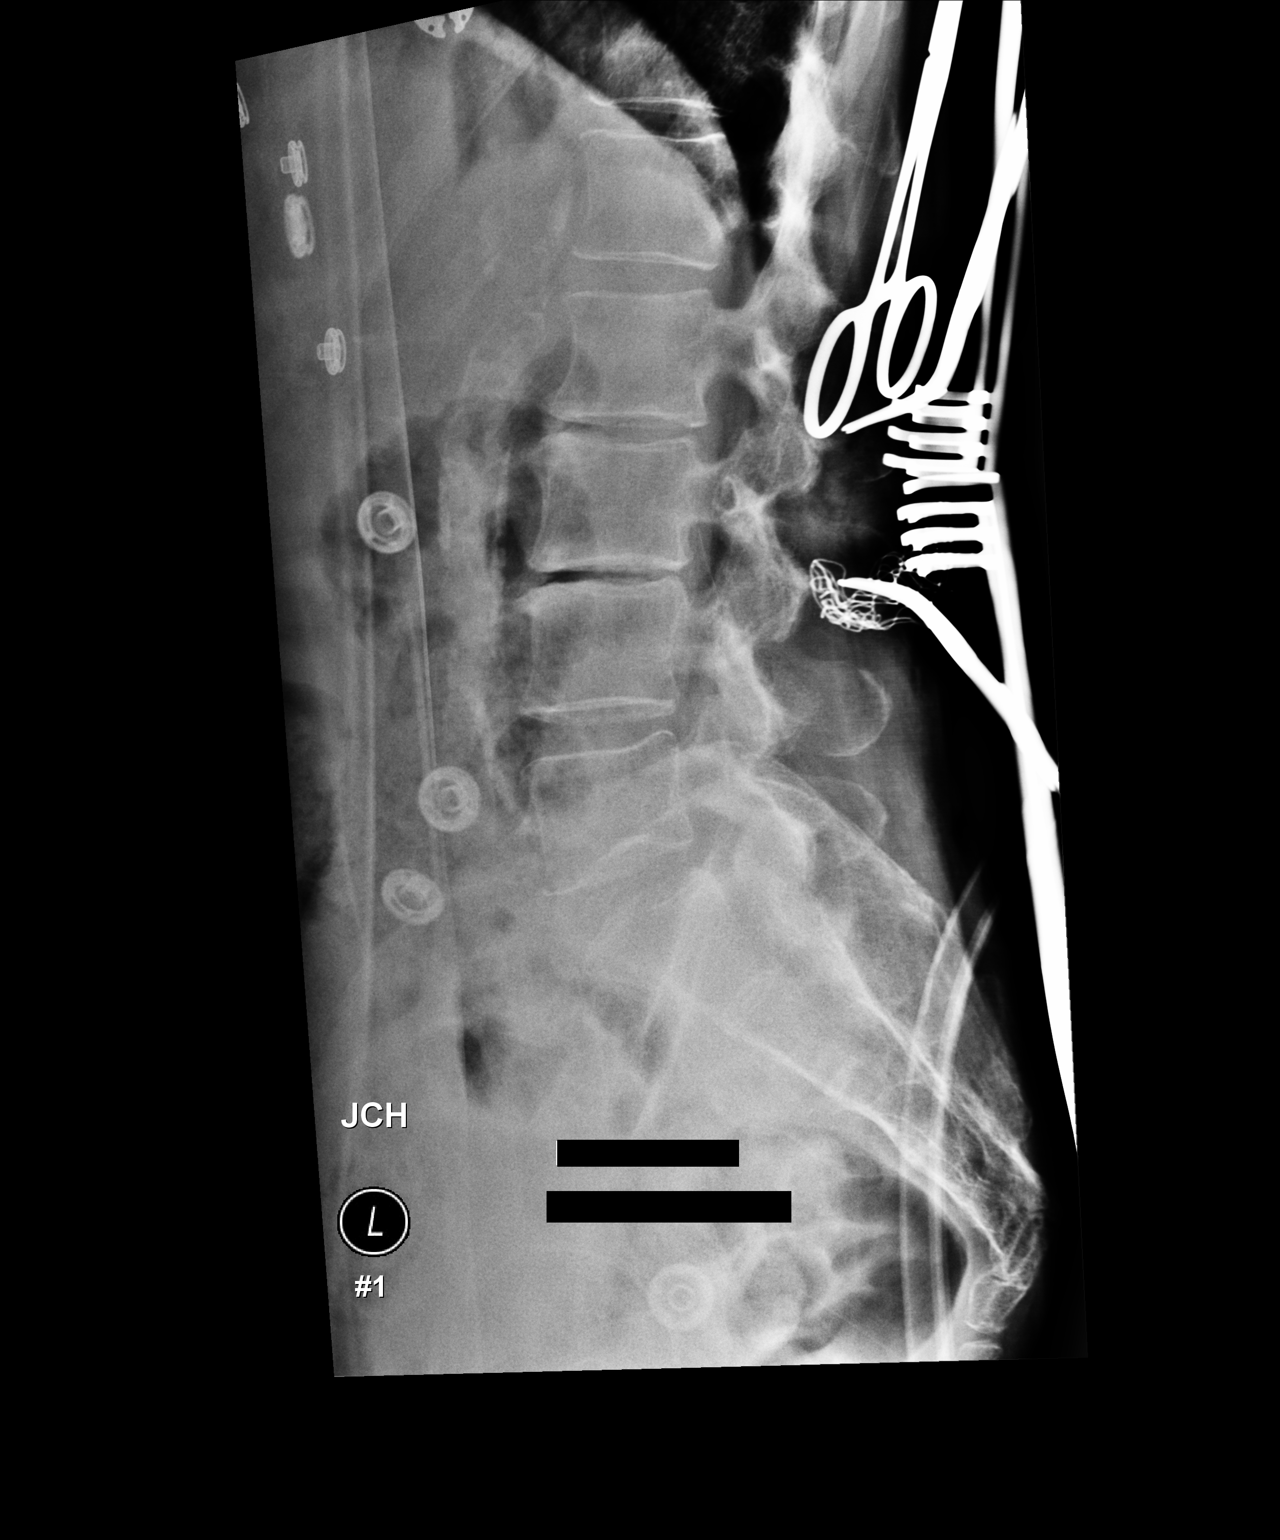

[1 of 1 positions shown; findings below may reference images not displayed]

FINDINGS: Surgical retractors are seen posteriorly centered at the L3 level.
IMPRESSION: Surgical retractors are seen posteriorly, centered at the L3 level.
Degenerative changes most marked at L2-3 and L3-4.

## 2018-03-03 ENCOUNTER — Ambulatory Visit: Payer: Medicare HMO | Admitting: Family

## 2018-03-04 ENCOUNTER — Telehealth: Payer: Self-pay | Admitting: Clinical

## 2018-03-04 DIAGNOSIS — Z1212 Encounter for screening for malignant neoplasm of rectum: Secondary | ICD-10-CM | POA: Diagnosis not present

## 2018-03-04 DIAGNOSIS — Z1211 Encounter for screening for malignant neoplasm of colon: Secondary | ICD-10-CM | POA: Diagnosis not present

## 2018-03-04 NOTE — Telephone Encounter (Signed)
TC to patient twice.  No answer and unable to leave a voicemail since no voicemail available.

## 2018-03-06 ENCOUNTER — Telehealth: Payer: Self-pay | Admitting: Clinical

## 2018-03-06 NOTE — Telephone Encounter (Signed)
This [facility] specialist tried to call patient, no answer and no voicemail available.

## 2018-03-10 ENCOUNTER — Encounter: Payer: Self-pay | Admitting: Family

## 2018-03-10 ENCOUNTER — Ambulatory Visit (INDEPENDENT_AMBULATORY_CARE_PROVIDER_SITE_OTHER): Payer: Medicare HMO | Admitting: Family

## 2018-03-10 VITALS — BP 150/83 | HR 83 | Temp 97.7°F | Ht 62.0 in | Wt 97.4 lb

## 2018-03-10 DIAGNOSIS — E162 Hypoglycemia, unspecified: Secondary | ICD-10-CM

## 2018-03-10 DIAGNOSIS — G8929 Other chronic pain: Secondary | ICD-10-CM | POA: Diagnosis not present

## 2018-03-10 DIAGNOSIS — E118 Type 2 diabetes mellitus with unspecified complications: Secondary | ICD-10-CM

## 2018-03-10 DIAGNOSIS — M5441 Lumbago with sciatica, right side: Secondary | ICD-10-CM | POA: Diagnosis not present

## 2018-03-10 MED ORDER — ONDANSETRON HCL 4 MG PO TABS: 4.0000 mg | ORAL_TABLET | Freq: Three times a day (TID) | ORAL | 3 refills | Status: DC | PRN

## 2018-03-10 MED ORDER — METFORMIN HCL 850 MG PO TABS: 850.0000 mg | ORAL_TABLET | Freq: Every day | ORAL | 1 refills | Status: DC

## 2018-03-10 NOTE — Progress Notes (Signed)
 Subjective:    Patient ID: Jamie [name], female    DOB: 08/09/1951, 66 y.o.   MRN: [id number]  Chief Complaint  Patient presents with  . Hypoglycemia    one month recheck   Diabetes  She presents for her follow-up diabetic visit. She has type 2 diabetes mellitus. Her disease course has been worsening. Hypoglycemia symptoms include nervousness/anxiousness and sleepiness. Associated symptoms include weakness. Pertinent negatives for diabetes include no blurred vision, no foot paresthesias and no visual change. Hypoglycemia complications include nocturnal hypoglycemia. Symptoms are stable. Risk factors for coronary artery disease include diabetes mellitus and sedentary lifestyle. She is following a generally unhealthy diet. Her overall blood glucose range is 130-140 mg/dl.  Back Pain  This is a chronic problem. The current episode started more than 1 year ago. The problem occurs intermittently. The problem has been gradually worsening since onset. The pain is present in the lumbar spine. The quality of the pain is described as aching. The pain is moderate. Associated symptoms include leg pain and weakness. Pertinent negatives include no bladder incontinence or bowel incontinence. She has tried analgesics, bed rest and NSAIDs for the symptoms. The treatment provided mild relief.      Review of Systems  Eyes: Negative for blurred vision.  Gastrointestinal: Negative for bowel incontinence.  Genitourinary: Negative for bladder incontinence.  Musculoskeletal: Positive for back pain.  Neurological: Positive for weakness.  Psychiatric/Behavioral: The patient is nervous/anxious.   All other systems reviewed and are negative.      Objective:   Physical Exam  Constitutional: She is oriented to person, place, and time. She appears well-developed and well-nourished. No distress.  HENT:  Head: Normocephalic and atraumatic.  Eyes: Pupils are equal, round, and reactive to light.  Neck: Normal  range of motion. Neck supple. No thyromegaly present.  Cardiovascular: Normal rate, regular rhythm, normal heart sounds and intact distal pulses.  No murmur heard. Pulmonary/Chest: Effort normal. No respiratory distress. She has wheezes.  Abdominal: Soft. Bowel sounds are normal. She exhibits no distension. There is no tenderness.  Musculoskeletal: She exhibits no edema or tenderness.  Pain in lower lumbar with extension and walking  Neurological: She is alert and oriented to person, place, and time. She has normal reflexes. No cranial nerve deficit.  Skin: Skin is warm and dry.  Psychiatric: She has a normal mood and affect. Her behavior is normal. Judgment and thought content normal.  Vitals reviewed.    BP (!) 150/83   Pulse 83   Temp 97.7 F (36.5 C) (Oral)   Ht 5' 2" (1.575 m)   Wt 97 lb 6.4 oz (44.2 kg)   BMI 17.81 kg/m      Assessment & Plan:  Jamie [name] comes in today with chief complaint of Hypoglycemia (one month recheck)   Diagnosis and orders addressed:  1. Diabetes mellitus with complication (HCC) Will stop Novolin today since her BS continues to drop Will start Metformin XR 850 mg today Low carb diet RTO in 1 month with glucose logs - BMP8+EGFR - metFORMIN (GLUCOPHAGE) 850 MG tablet; Take 1 tablet (850 mg total) by mouth daily with breakfast.  Dispense: 90 tablet; Refill: 1  2. Hypoglycemia -Stop Novolin - BMP8+EGFR - metFORMIN (GLUCOPHAGE) 850 MG tablet; Take 1 tablet (850 mg total) by mouth daily with breakfast.  Dispense: 90 tablet; Refill: 1   3. Chronic bilateral low back pain with right-sided sciatica Encouraged patient to take Ultram, will order zofran to take as 20 mins prior    Encouraged patient to Engineer, materials for follow up Continue with exercises    Follow up plan: 1 month to recheck hypoglycemia    Evelina Dun, FNP

## 2018-03-10 NOTE — Patient Instructions (Signed)
Hypoglycemia  Hypoglycemia occurs when the level of sugar (glucose) in the blood is too low. Glucose is a type of sugar that provides the body's main source of energy. Certain hormones (insulin and glucagon) control the level of glucose in the blood. Insulin lowers blood glucose, and glucagon increases blood glucose. Hypoglycemia can result from having too much insulin in the bloodstream, or from not eating enough food that contains glucose.  Hypoglycemia can happen in people who do or do not have diabetes. It can develop quickly, and it can be a medical emergency.  What are the causes?  Hypoglycemia occurs most often in people who have diabetes. If you have diabetes, hypoglycemia may be caused by:   Diabetes medicine.   Not eating enough, or not eating often enough.   Increased physical activity.   Drinking alcohol, especially when you have not eaten recently.    If you do not have diabetes, hypoglycemia may be caused by:   A tumor in the pancreas. The pancreas is the organ that makes insulin.   Not eating enough, or not eating for long periods at a time (fasting).   Severe infection or illness that affects the liver, heart, or kidneys.   Certain medicines.    You may also have reactive hypoglycemia. This condition causes hypoglycemia within 4 hours of eating a meal. This may occur after having stomach surgery. Sometimes, the cause of reactive hypoglycemia is not known.  What increases the risk?  Hypoglycemia is more likely to develop in:   People who have diabetes and take medicines to lower blood glucose.   People who abuse alcohol.   People who have a severe illness.    What are the signs or symptoms?  Hypoglycemia may not cause any symptoms. If you have symptoms, they may include:   Hunger.   Anxiety.   Sweating and feeling clammy.   Confusion.   Dizziness or feeling light-headed.   Sleepiness.   Nausea.   Increased heart rate.   Headache.   Blurry  vision.   Seizure.   Nightmares.   Tingling or numbness around the mouth, lips, or tongue.   A change in speech.   Decreased ability to concentrate.   A change in coordination.   Restless sleep.   Tremors or shakes.   Fainting.   Irritability.    How is this diagnosed?  Hypoglycemia is diagnosed with a blood test to measure your blood glucose level. This blood test is done while you are having symptoms. Your health care provider may also do a physical exam and review your medical history.  If you do not have diabetes, other tests may be done to find the cause of your hypoglycemia.  How is this treated?  This condition can often be treated by immediately eating or drinking something that contains glucose, such as:   3-4 sugar tablets (glucose pills).   Glucose gel, 15-gram tube.   Fruit juice, 4 oz (120 mL).   Regular soda (not diet soda), 4 oz (120 mL).   Low-fat milk, 4 oz (120 mL).   Several pieces of hard candy.   Sugar or honey, 1 Tbsp.    Treating Hypoglycemia If You Have Diabetes    If you are alert and able to swallow safely, follow the 15:15 rule:   Take 15 grams of a rapid-acting carbohydrate. Rapid-acting options include:  ? 1 tube of glucose gel.  ? 3 glucose pills.  ? 6-8 pieces of hard candy.  ?   4 oz (120 mL) of fruit juice.  ? 4 oz (120 ml) of regular (not diet) soda.   Check your blood glucose 15 minutes after you take the carbohydrate.   If the repeat blood glucose level is still at or below 70 mg/dL (3.9 mmol/L), take 15 grams of a carbohydrate again.   If your blood glucose level does not increase above 70 mg/dL (3.9 mmol/L) after 3 tries, seek emergency medical care.   After your blood glucose level returns to normal, eat a meal or a snack within 1 hour.    Treating Severe Hypoglycemia  Severe hypoglycemia is when your blood glucose level is at or below 54 mg/dL (3 mmol/L). Severe hypoglycemia is an emergency. Do not wait to see if the symptoms will go away. Get medical help  right away. Call your local emergency services (911 in the [place] Do not drive yourself to the hospital.  If you have severe hypoglycemia and you cannot eat or drink, you may need an injection of glucagon. A family member or close friend should learn how to check your blood glucose and how to give you a glucagon injection. Ask your health care provider if you need to have an emergency glucagon injection kit available.  Severe hypoglycemia may need to be treated in a hospital. The treatment may include getting glucose through an IV tube. You may also need treatment for the cause of your hypoglycemia.  Follow these instructions at home:  General instructions   Avoid any diets that cause you to not eat enough food. Talk with your health care provider before you start any new diet.   Take over-the-counter and prescription medicines only as told by your health care provider.   Limit alcohol intake to no more than 1 drink per day for nonpregnant women and 2 drinks per day for men. One drink equals 12 oz of beer, 5 oz of wine, or 1 oz of hard liquor.   Keep all follow-up visits as told by your health care provider. This is important.  If You Have Diabetes:     Make sure you know the symptoms of hypoglycemia.   Always have a rapid-acting carbohydrate snack with you to treat low blood sugar.   Follow your diabetes management plan, as told by your health care provider. Make sure you:  ? Take your medicines as directed.  ? Follow your exercise plan.  ? Follow your meal plan. Eat on time, and do not skip meals.  ? Check your blood glucose as often as directed. Make sure to check your blood glucose before and after exercise. If you exercise longer or in a different way than usual, check your blood glucose more often.  ? Follow your sick day plan whenever you cannot eat or drink normally. Make this plan in advance with your health care provider.   Share your diabetes management plan with people in your workplace, school,  and household.   Check your urine for ketones when you are ill and as told by your health care provider.   Carry a medical alert card or wear medical alert jewelry.  If You Have Reactive Hypoglycemia or Low Blood Sugar From Other Causes:   Monitor your blood glucose as told by your health care provider.   Follow instructions from your health care provider about eating or drinking restrictions.  Contact a health care provider if:   You have problems keeping your blood glucose in your target range.   You have   frequent episodes of hypoglycemia.  Get help right away if:   You continue to have hypoglycemia symptoms after eating or drinking something containing glucose.   Your blood glucose is at or below 54 mg/dL (3 mmol/L).   You have a seizure.   You faint.  These symptoms may represent a serious problem that is an emergency. Do not wait to see if the symptoms will go away. Get medical help right away. Call your local emergency services (911 in the [place] Do not drive yourself to the hospital.  This information is not intended to replace advice given to you by your health care provider. Make sure you discuss any questions you have with your health care provider.  Document Released: 09/30/2005 Document Revised: 03/13/2016 Document Reviewed: 11/03/2015  Elsevier Interactive Patient Education  2018 Elsevier Inc.

## 2018-03-11 LAB — BMP8+EGFR
BUN/Creatinine Ratio: 15 (ref 12–28)
BUN: 16 mg/dL (ref 8–27)
CALCIUM: 8.9 mg/dL (ref 8.7–10.3)
CO2: 24 mmol/L (ref 20–29)
CREATININE: 1.07 mg/dL — AB (ref 0.57–1.00)
Chloride: 100 mmol/L (ref 96–106)
GFR calc non Af Amer: 55 mL/min/{1.73_m2} — ABNORMAL LOW (ref >59)
GFR, EST AFRICAN AMERICAN: 63 mL/min/{1.73_m2} (ref >59)
Glucose: 182 mg/dL — ABNORMAL HIGH (ref 65–99)
POTASSIUM: 3.9 mmol/L (ref 3.5–5.2)
Sodium: 139 mmol/L (ref 134–144)

## 2018-03-17 ENCOUNTER — Ambulatory Visit (INDEPENDENT_AMBULATORY_CARE_PROVIDER_SITE_OTHER): Payer: Medicare HMO | Admitting: Physician Assistant

## 2018-03-17 ENCOUNTER — Encounter: Payer: Self-pay | Admitting: Physician Assistant

## 2018-03-17 VITALS — BP 166/103 | HR 108 | Temp 98.1°F | Ht 62.0 in | Wt 96.0 lb

## 2018-03-17 DIAGNOSIS — L0231 Cutaneous abscess of buttock: Secondary | ICD-10-CM

## 2018-03-17 MED ORDER — CEFTRIAXONE SODIUM 1 G IJ SOLR
1.0000 g | Freq: Once | INTRAMUSCULAR | Status: AC
  Administered 2018-03-17: 1 g via INTRAMUSCULAR

## 2018-03-17 MED ORDER — CLINDAMYCIN HCL 150 MG PO CAPS: 150.0000 mg | ORAL_CAPSULE | Freq: Three times a day (TID) | ORAL | 0 refills | Status: DC

## 2018-03-17 NOTE — Progress Notes (Signed)
BP (!) 166/103   Pulse (!) 108   Temp 98.1 F (36.7 C) (Oral)   Ht 5' 2" (1.575 m)   Wt 96 lb (43.5 kg)   SpO2 94%   BMI 17.56 kg/m    Subjective:    Patient ID: Jamie [name], female    DOB: 03-23-1951, 67 y.o.   MRN: [id number]  HPI: Jamie [name] is a 67 y.o. female presenting on 03/17/2018 for Recurrent Skin Infections (on gluteal )  This patient comes in complaining of severe left gluteal pain related to an abscess.  She has had a spot there before.  It has swelled up tremendously in the past week.  She is unable to sit on it.  She states she has had some drainage from it.  She denies any fever or chills.  He states she has had abscesses like this lots of times in her life.  She has never been on prevention medicine to try to keep them at [place].  Past Medical History:  Diagnosis Date  . Breast cancer ([place])   . COPD (chronic obstructive pulmonary disease) ([place])   . Diabetes ([place])   . HTN (hypertension)   . Hyperlipidemia   . Personal history of radiation therapy   . Vitamin D deficiency    Relevant past medical, surgical, family and social history reviewed and updated as indicated. Interim medical history since our last visit reviewed. Allergies and medications reviewed and updated. DATA REVIEWED: CHART IN EPIC  Family History reviewed for pertinent findings.  Review of Systems  Constitutional: Negative.   HENT: Negative.   Eyes: Negative.   Respiratory: Negative.   Gastrointestinal: Negative.   Genitourinary: Negative.   Musculoskeletal: Positive for arthralgias, back pain, gait problem, joint swelling, myalgias and neck pain.  Skin: Positive for color change and wound.    Allergies as of 03/17/2018      Reactions   Lipitor [atorvastatin] Other (See Comments)   Constipation    Soma [carisoprodol] Nausea And Vomiting      Medication List        Accurate as of 03/17/18  2:37 PM. Always use your most recent med list.          ACCU-CHEK AVIVA PLUS test  strip Generic drug:  glucose blood USE AS INSTRUCTED   ACCU-CHEK AVIVA PLUS w/Device Kit 1 Device by Does not apply route 2 (two) times daily.   ACCU-CHEK FASTCLIX LANCETS Misc Use to check blood sugar two times daily  Dx Code E11.9   albuterol 108 (90 Base) MCG/ACT inhaler Commonly known as:  PROVENTIL HFA;VENTOLIN HFA Inhale 2 puffs into the lungs every 6 (six) hours as needed for wheezing or shortness of breath.   amLODipine 5 MG tablet Commonly known as:  NORVASC Take 1 tablet (5 mg total) by mouth daily.   aspirin EC 81 MG tablet Take 1 tablet (81 mg total) by mouth daily.   BD INSULIN SYRINGE U/F 31G X 5/16" 0.5 ML Misc Generic drug:  Insulin Syringe-Needle U-100 USE TWICE DAILY   budesonide-formoterol 160-4.5 MCG/ACT inhaler Commonly known as:  SYMBICORT Inhale 2 puffs into the lungs 2 (two) times daily.   clindamycin 150 MG capsule Commonly known as:  CLEOCIN Take 1 capsule (150 mg total) by mouth 3 (three) times daily.   losartan 100 MG tablet Commonly known as:  COZAAR Take 1 tablet (100 mg total) by mouth daily.   metFORMIN 850 MG tablet Commonly known as:  GLUCOPHAGE Take 1 tablet (  850 mg total) by mouth daily with breakfast.   ondansetron 4 MG tablet Commonly known as:  ZOFRAN Take 1 tablet (4 mg total) by mouth every 8 (eight) hours as needed for nausea or vomiting.   pravastatin 20 MG tablet Commonly known as:  PRAVACHOL Take 1 tablet (20 mg total) by mouth daily.   sertraline 25 MG tablet Commonly known as:  ZOLOFT Take 1 tablet (25 mg total) by mouth daily for 14 days, THEN 2 tablets (50 mg total) daily for 14 days. Start taking on:  02/05/2018   tiotropium 18 MCG inhalation capsule Commonly known as:  SPIRIVA HANDIHALER Place 1 capsule (18 mcg total) into inhaler and inhale daily.   traMADol 50 MG tablet Commonly known as:  ULTRAM Take 1-2 tablets (50-100 mg total) by mouth every 12 (twelve) hours as needed.   Vitamin D (Ergocalciferol)  50000 units Caps capsule Commonly known as:  DRISDOL Take 1 capsule (50,000 Units total) by mouth every 7 (seven) days.          Objective:    BP (!) 166/103   Pulse (!) 108   Temp 98.1 F (36.7 C) (Oral)   Ht 5' 2" (1.575 m)   Wt 96 lb (43.5 kg)   SpO2 94%   BMI 17.56 kg/m   Allergies  Allergen Reactions  . Lipitor [Atorvastatin] Other (See Comments)    Constipation   . Soma [Carisoprodol] Nausea And Vomiting    Wt Readings from Last 3 Encounters:  03/17/18 96 lb (43.5 kg)  03/10/18 97 lb 6.4 oz (44.2 kg)  01/29/18 98 lb 12.8 oz (44.8 kg)    Physical Exam  Constitutional: She is oriented to person, place, and time. She appears well-developed and well-nourished.  HENT:  Head: Normocephalic and atraumatic.  Eyes: Pupils are equal, round, and reactive to light. Conjunctivae and EOM are normal.  Cardiovascular: Normal rate, regular rhythm, normal heart sounds and intact distal pulses.  Pulmonary/Chest: Effort normal and breath sounds normal.  Abdominal: Soft. Bowel sounds are normal.  Neurological: She is alert and oriented to person, place, and time. She has normal reflexes.  Skin: Skin is warm and dry. Lesion and rash noted. Rash is maculopapular. There is erythema.     Firm and red with very limited fluctuation, open wound at the center  Psychiatric: She has a normal mood and affect. Her behavior is normal. Judgment and thought content normal.    Results for orders placed or performed in visit on 03/10/18  [facility]  Result Value Ref Range   Glucose 182 (H) 65 - 99 mg/dL   BUN 16 8 - 27 mg/dL   Creatinine, Ser 1.07 (H) 0.57 - 1.00 mg/dL   GFR calc non Af Amer 55 (L) >59 mL/min/1.73   GFR calc Af Amer 63 >59 mL/min/1.73   BUN/Creatinine Ratio 15 12 - 28   Sodium 139 134 - 144 mmol/L   Potassium 3.9 3.5 - 5.2 mmol/L   Chloride 100 96 - 106 mmol/L   CO2 24 20 - 29 mmol/L   Calcium 8.9 8.7 - 10.3 mg/dL      Assessment & Plan:   1. Abscess of buttock,  left Follow up [facility] 10 days Possibly stay on chronic antibiotic to suppress recurrent abscess or general surgeon to have removed. - cefTRIAXone (ROCEPHIN) injection 1 g - clindamycin (CLEOCIN) 150 MG capsule; Take 1 capsule (150 mg total) by mouth 3 (three) times daily.  Dispense: 30 capsule; Refill: 0 Continue soaks  as tolerated  Continue all other maintenance medications as listed above.  Follow up plan: Return in about 10 days (around 03/27/2018) for with [facility].  Educational handout given for [place] PA-C [place] [address]  [place], [place] [address] [phone]   03/17/2018, 2:37 PM

## 2018-03-19 LAB — COLOGUARD: COLOGUARD: NEGATIVE

## 2018-03-27 ENCOUNTER — Ambulatory Visit: Payer: Medicare HMO | Admitting: Family

## 2018-04-07 ENCOUNTER — Other Ambulatory Visit: Payer: Self-pay | Admitting: Family

## 2018-04-07 DIAGNOSIS — I10 Essential (primary) hypertension: Secondary | ICD-10-CM

## 2018-04-09 ENCOUNTER — Telehealth: Payer: Self-pay

## 2018-04-09 NOTE — Telephone Encounter (Signed)
[facility] - Left Msg 

## 2018-04-13 ENCOUNTER — Ambulatory Visit (INDEPENDENT_AMBULATORY_CARE_PROVIDER_SITE_OTHER): Payer: 59 | Admitting: Family

## 2018-04-13 ENCOUNTER — Encounter: Payer: Self-pay | Admitting: Family

## 2018-04-13 VITALS — BP 141/91 | HR 74 | Temp 97.4°F | Ht 62.0 in | Wt 93.2 lb

## 2018-04-13 DIAGNOSIS — E162 Hypoglycemia, unspecified: Secondary | ICD-10-CM | POA: Diagnosis not present

## 2018-04-13 DIAGNOSIS — H9193 Unspecified hearing loss, bilateral: Secondary | ICD-10-CM | POA: Diagnosis not present

## 2018-04-13 DIAGNOSIS — E118 Type 2 diabetes mellitus with unspecified complications: Secondary | ICD-10-CM

## 2018-04-13 LAB — BMP8+EGFR
BUN / CREAT RATIO: 18 (ref 12–28)
BUN: 17 mg/dL (ref 8–27)
CALCIUM: 9.5 mg/dL (ref 8.7–10.3)
CHLORIDE: 99 mmol/L (ref 96–106)
CO2: 25 mmol/L (ref 20–29)
Creatinine, Ser: 0.95 mg/dL (ref 0.57–1.00)
GFR calc Af Amer: 73 mL/min/{1.73_m2} (ref >59)
GFR calc non Af Amer: 63 mL/min/{1.73_m2} (ref >59)
GLUCOSE: 174 mg/dL — AB (ref 65–99)
Potassium: 4.2 mmol/L (ref 3.5–5.2)
Sodium: 139 mmol/L (ref 134–144)

## 2018-04-13 LAB — BAYER DCA HB A1C WAIVED: HB A1C: 7.3 % — AB (ref <7.0)

## 2018-04-13 MED ORDER — METFORMIN HCL 850 MG PO TABS: 1700.0000 mg | ORAL_TABLET | Freq: Every day | ORAL | 1 refills | Status: DC

## 2018-04-13 MED ORDER — METFORMIN HCL 850 MG PO TABS: 850.0000 mg | ORAL_TABLET | Freq: Two times a day (BID) | ORAL | 1 refills | Status: DC

## 2018-04-13 NOTE — Progress Notes (Signed)
Subjective:    Patient ID: Jamie [name], female    DOB: 1951-03-14, 67 y.o.   MRN: [id number]  Chief Complaint  Patient presents with  . Diabetes    one month recheck   Pt presents to the office today recheck DM. Pt has had low glucose in the office. Unsure if this was a false value, but we discontinued her Sliding scale insulin.   PT brings  Diabetes  She presents for her follow-up diabetic visit. She has type 2 diabetes mellitus. Her disease course has been stable. Hypoglycemia symptoms include nervousness/anxiousness. There are no diabetic associated symptoms. Pertinent negatives for diabetes include no foot paresthesias and no foot ulcerations. There are no hypoglycemic complications. Symptoms are stable. Risk factors for coronary artery disease include dyslipidemia, diabetes mellitus, hypertension and post-menopausal. She is following a generally healthy diet. Her overall blood glucose range is 140-180 mg/dl. Eye exam is current.      Review of Systems  HENT: Positive for hearing loss.   Psychiatric/Behavioral: The patient is nervous/anxious.   All other systems reviewed and are negative.      Objective:   Physical Exam  Constitutional: She is oriented to person, place, and time. She appears well-developed and well-nourished. No distress.  HENT:  Head: Normocephalic and atraumatic.  Right Ear: External ear normal.  Left Ear: External ear normal.  Mouth/Throat: Oropharynx is clear and moist.  HOH  Eyes: Pupils are equal, round, and reactive to light.  Neck: Normal range of motion. Neck supple. No thyromegaly present.  Cardiovascular: Normal rate, regular rhythm, normal heart sounds and intact distal pulses.  No murmur heard. Pulmonary/Chest: Effort normal and breath sounds normal. No respiratory distress. She has no wheezes.  Abdominal: Soft. Bowel sounds are normal. She exhibits no distension. There is no tenderness.  Musculoskeletal: Normal range of motion. She  exhibits no edema or tenderness.  Pain in lumbar area with flexion and extension   Neurological: She is alert and oriented to person, place, and time. She has normal reflexes. No cranial nerve deficit.  Skin: Skin is warm and dry.  Psychiatric: She has a normal mood and affect. Her behavior is normal. Judgment and thought content normal.  Vitals reviewed.     BP (!) 141/91   Pulse 74   Temp (!) 97.4 F (36.3 C) (Oral)   Ht '5\' 2"'  (1.575 m)   Wt 93 lb 3.2 oz (42.3 kg)   BMI 17.05 kg/m      Assessment & Plan:  Jamie [name] was seen today for diabetes.  Diagnoses and all orders for this visit:  Diabetes mellitus with complication ([place]) -     Bayer DCA Hb A1c Waived -     BMP8+EGFR -     Discontinue: metFORMIN (GLUCOPHAGE) 850 MG tablet; Take 2 tablets (1,700 mg total) by mouth daily with breakfast. -     metFORMIN (GLUCOPHAGE) 850 MG tablet; Take 1 tablet (850 mg total) by mouth 2 (two) times daily with a meal.  Bilateral hearing loss, unspecified hearing loss type -     Bayer DCA Hb A1c Waived -     BMP8+EGFR  Hypoglycemia -     Bayer DCA Hb A1c Waived -     BMP8+EGFR -     Discontinue: metFORMIN (GLUCOPHAGE) 850 MG tablet; Take 2 tablets (1,700 mg total) by mouth daily with breakfast. -     metFORMIN (GLUCOPHAGE) 850 MG tablet; Take 1 tablet (850 mg total) by mouth 2 (two) times daily  with a meal.   We will increase metformin today to 850 mg BID  from once daily Continue  To monitor BS Handicap form completed for patient RTO in 2 months or if symptoms worsen or do not improve    Evelina Dun, FNP

## 2018-04-13 NOTE — Patient Instructions (Signed)
Diabetes Mellitus and Nutrition When you have diabetes (diabetes mellitus), it is very important to have healthy eating habits because your blood sugar (glucose) levels are greatly affected by what you eat and drink. Eating healthy foods in the appropriate amounts, at about the same times every day, can help you:  Control your blood glucose.  Lower your risk of heart disease.  Improve your blood pressure.  Reach or maintain a healthy weight.  Every person with diabetes is different, and each person has different needs for a meal plan. Your health care provider may recommend that you work with a diet and nutrition specialist (dietitian) to make a meal plan that is best for you. Your meal plan may vary depending on factors such as:  The calories you need.  The medicines you take.  Your weight.  Your blood glucose, blood pressure, and cholesterol levels.  Your activity level.  Other health conditions you have, such as heart or kidney disease.  How do carbohydrates affect me? Carbohydrates affect your blood glucose level more than any other type of food. Eating carbohydrates naturally increases the amount of glucose in your blood. Carbohydrate counting is a method for keeping track of how many carbohydrates you eat. Counting carbohydrates is important to keep your blood glucose at a healthy level, especially if you use insulin or take certain oral diabetes medicines. It is important to know how many carbohydrates you can safely have in each meal. This is different for every person. Your dietitian can help you calculate how many carbohydrates you should have at each meal and for snack. Foods that contain carbohydrates include:  Bread, cereal, rice, pasta, and crackers.  Potatoes and corn.  Peas, beans, and lentils.  Milk and yogurt.  Fruit and juice.  Desserts, such as cakes, cookies, ice cream, and candy.  How does alcohol affect me? Alcohol can cause a sudden decrease in blood  glucose (hypoglycemia), especially if you use insulin or take certain oral diabetes medicines. Hypoglycemia can be a life-threatening condition. Symptoms of hypoglycemia (sleepiness, dizziness, and confusion) are similar to symptoms of having too much alcohol. If your health care provider says that alcohol is safe for you, follow these guidelines:  Limit alcohol intake to no more than 1 drink per day for nonpregnant women and 2 drinks per day for men. One drink equals 12 oz of beer, 5 oz of wine, or 1 oz of hard liquor.  Do not drink on an empty stomach.  Keep yourself hydrated with water, diet soda, or unsweetened iced tea.  Keep in mind that regular soda, juice, and other mixers may contain a lot of sugar and must be counted as carbohydrates.  What are tips for following this plan? Reading food labels  Start by checking the serving size on the label. The amount of calories, carbohydrates, fats, and other nutrients listed on the label are based on one serving of the food. Many foods contain more than one serving per package.  Check the total grams (g) of carbohydrates in one serving. You can calculate the number of servings of carbohydrates in one serving by dividing the total carbohydrates by 15. For example, if a food has 30 g of total carbohydrates, it would be equal to 2 servings of carbohydrates.  Check the number of grams (g) of saturated and trans fats in one serving. Choose foods that have low or no amount of these fats.  Check the number of milligrams (mg) of sodium in one serving. Most people   should limit total sodium intake to less than 2,300 mg per day.  Always check the nutrition information of foods labeled as "low-fat" or "nonfat". These foods may be higher in added sugar or refined carbohydrates and should be avoided.  Talk to your dietitian to identify your daily goals for nutrients listed on the label. Shopping  Avoid buying canned, premade, or processed foods. These  foods tend to be high in fat, sodium, and added sugar.  Shop around the outside edge of the grocery store. This includes fresh fruits and vegetables, bulk grains, fresh meats, and fresh dairy. Cooking  Use low-heat cooking methods, such as baking, instead of high-heat cooking methods like deep frying.  Cook using healthy oils, such as olive, canola, or sunflower oil.  Avoid cooking with butter, cream, or high-fat meats. Meal planning  Eat meals and snacks regularly, preferably at the same times every day. Avoid going long periods of time without eating.  Eat foods high in fiber, such as fresh fruits, vegetables, beans, and whole grains. Talk to your dietitian about how many servings of carbohydrates you can eat at each meal.  Eat 4-6 ounces of lean protein each day, such as lean meat, chicken, fish, eggs, or tofu. 1 ounce is equal to 1 ounce of meat, chicken, or fish, 1 egg, or 1/4 cup of tofu.  Eat some foods each day that contain healthy fats, such as avocado, nuts, seeds, and fish. Lifestyle   Check your blood glucose regularly.  Exercise at least 30 minutes 5 or more days each week, or as told by your health care provider.  Take medicines as told by your health care provider.  Do not use any products that contain nicotine or tobacco, such as cigarettes and e-cigarettes. If you need help quitting, ask your health care provider.  Work with a counselor or diabetes educator to identify strategies to manage stress and any emotional and social challenges. What are some questions to ask my health care provider?  Do I need to meet with a diabetes educator?  Do I need to meet with a dietitian?  What number can I call if I have questions?  When are the best times to check my blood glucose? Where to find more information:  American Diabetes Association: diabetes.org/food-and-fitness/food  Academy of Nutrition and Dietetics:  www.eatright.org/resources/health/diseases-and-conditions/diabetes  National Institute of Diabetes and Digestive and Kidney Diseases (NIH): www.niddk.nih.gov/health-information/diabetes/overview/diet-eating-physical-activity Summary  A healthy meal plan will help you control your blood glucose and maintain a healthy lifestyle.  Working with a diet and nutrition specialist (dietitian) can help you make a meal plan that is best for you.  Keep in mind that carbohydrates and alcohol have immediate effects on your blood glucose levels. It is important to count carbohydrates and to use alcohol carefully. This information is not intended to replace advice given to you by your health care provider. Make sure you discuss any questions you have with your health care provider. Document Released: 06/27/2005 Document Revised: 11/04/2016 Document Reviewed: 11/04/2016 Elsevier Interactive Patient Education  2018 Elsevier Inc.  

## 2018-05-01 ENCOUNTER — Ambulatory Visit: Payer: Medicare HMO | Admitting: Family

## 2018-05-05 ENCOUNTER — Ambulatory Visit (INDEPENDENT_AMBULATORY_CARE_PROVIDER_SITE_OTHER): Payer: Medicare HMO | Admitting: Family

## 2018-05-05 ENCOUNTER — Encounter: Payer: Self-pay | Admitting: Family

## 2018-05-05 VITALS — BP 158/99 | HR 75 | Temp 97.2°F | Ht 62.0 in | Wt 90.4 lb

## 2018-05-05 DIAGNOSIS — M5441 Lumbago with sciatica, right side: Secondary | ICD-10-CM

## 2018-05-05 DIAGNOSIS — G8929 Other chronic pain: Secondary | ICD-10-CM | POA: Diagnosis not present

## 2018-05-05 DIAGNOSIS — E118 Type 2 diabetes mellitus with unspecified complications: Secondary | ICD-10-CM

## 2018-05-05 DIAGNOSIS — J449 Chronic obstructive pulmonary disease, unspecified: Secondary | ICD-10-CM | POA: Diagnosis not present

## 2018-05-05 DIAGNOSIS — R636 Underweight: Secondary | ICD-10-CM

## 2018-05-05 DIAGNOSIS — H9193 Unspecified hearing loss, bilateral: Secondary | ICD-10-CM | POA: Diagnosis not present

## 2018-05-05 DIAGNOSIS — F17200 Nicotine dependence, unspecified, uncomplicated: Secondary | ICD-10-CM | POA: Diagnosis not present

## 2018-05-05 DIAGNOSIS — K219 Gastro-esophageal reflux disease without esophagitis: Secondary | ICD-10-CM

## 2018-05-05 DIAGNOSIS — E162 Hypoglycemia, unspecified: Secondary | ICD-10-CM

## 2018-05-05 DIAGNOSIS — E1169 Type 2 diabetes mellitus with other specified complication: Secondary | ICD-10-CM | POA: Diagnosis not present

## 2018-05-05 DIAGNOSIS — E559 Vitamin D deficiency, unspecified: Secondary | ICD-10-CM | POA: Diagnosis not present

## 2018-05-05 DIAGNOSIS — E785 Hyperlipidemia, unspecified: Secondary | ICD-10-CM | POA: Diagnosis not present

## 2018-05-05 DIAGNOSIS — M5416 Radiculopathy, lumbar region: Secondary | ICD-10-CM

## 2018-05-05 DIAGNOSIS — I10 Essential (primary) hypertension: Secondary | ICD-10-CM

## 2018-05-05 LAB — BAYER DCA HB A1C WAIVED: HB A1C (BAYER DCA - WAIVED): 7.9 % — ABNORMAL HIGH (ref <7.0)

## 2018-05-05 MED ORDER — GABAPENTIN 100 MG PO CAPS: 100.0000 mg | ORAL_CAPSULE | Freq: Three times a day (TID) | ORAL | 3 refills | Status: DC

## 2018-05-05 MED ORDER — METFORMIN HCL 850 MG PO TABS: 850.0000 mg | ORAL_TABLET | Freq: Two times a day (BID) | ORAL | 1 refills | Status: DC

## 2018-05-05 MED ORDER — AMLODIPINE BESYLATE 5 MG PO TABS: 5.0000 mg | ORAL_TABLET | Freq: Every day | ORAL | 1 refills | Status: DC

## 2018-05-05 MED ORDER — TRAMADOL HCL 50 MG PO TABS: 50.0000 mg | ORAL_TABLET | Freq: Two times a day (BID) | ORAL | 1 refills | Status: DC | PRN

## 2018-05-05 NOTE — Addendum Note (Signed)
Addended by: Evelina Dun A on: 05/05/2018 10:02 AM   Modules accepted: Orders

## 2018-05-05 NOTE — Progress Notes (Addendum)
Subjective:    Patient ID: Jamie [name], female    DOB: 11-19-1950, 67 y.o.   MRN: [id number]  Chief Complaint  Patient presents with  . Diabetes    three month recheck   Pt presents to the office today for chronic follow up. PT had surgical decompression and stabilization from L2-L4 on 02/05/17. Pt states her back continues to hurtand her pain is a10out 10 when she walks or standing. Diabetes  She presents for her follow-up diabetic visit. She has type 2 diabetes mellitus. Her disease course has been stable. There are no hypoglycemic associated symptoms. Pertinent negatives for diabetes include no foot paresthesias and no visual change. There are no hypoglycemic complications. Symptoms are stable. Pertinent negatives for diabetic complications include no CVA, heart disease, nephropathy or peripheral neuropathy. Risk factors for coronary artery disease include dyslipidemia, diabetes mellitus, hypertension and sedentary lifestyle. She is following a diabetic diet. Her overall blood glucose range is 130-140 mg/dl. Eye exam is current.  Hypertension  This is a chronic problem. The current episode started more than 1 year ago. The problem has been waxing and waning since onset. The problem is uncontrolled. Pertinent negatives include no peripheral edema or shortness of breath. Risk factors for coronary artery disease include dyslipidemia, diabetes mellitus and sedentary lifestyle. There is no history of CVA.  Back Pain  This is a chronic problem. The current episode started more than 1 year ago. The problem occurs intermittently. The problem has been waxing and waning since onset. The pain is present in the lumbar spine. The pain is at a severity of 10/10. The pain is moderate. Pertinent negatives include no bladder incontinence or bowel incontinence. She has tried analgesics for the symptoms. The treatment provided mild relief.  Gastroesophageal Reflux  She complains of belching and heartburn.  She reports no coughing. This is a chronic problem. The current episode started more than 1 year ago. The problem occurs occasionally. The problem has been waxing and waning. The symptoms are aggravated by certain foods and smoking. She has tried a PPI for the symptoms. The treatment provided moderate relief.  Hyperlipidemia  This is a chronic problem. The current episode started more than 1 year ago. The problem is controlled. Recent lipid tests were reviewed and are normal. Pertinent negatives include no shortness of breath. Current antihyperlipidemic treatment includes statins. The current treatment provides moderate improvement of lipids.  COPD PT continues to smoke a pack a day. PT using Spiriva daily.    Review of Systems  Respiratory: Negative for cough and shortness of breath.   Gastrointestinal: Positive for heartburn. Negative for bowel incontinence.  Genitourinary: Negative for bladder incontinence.  Musculoskeletal: Positive for back pain.  All other systems reviewed and are negative.      Objective:   Physical Exam  Constitutional: She is oriented to person, place, and time. She appears well-developed and well-nourished. No distress.  HENT:  Head: Normocephalic and atraumatic.  Right Ear: External ear normal.  Left Ear: External ear normal.  Mouth/Throat: Oropharynx is clear and moist.  HOH  Eyes: Pupils are equal, round, and reactive to light.  Neck: Normal range of motion. Neck supple. No thyromegaly present.  Cardiovascular: Normal rate, regular rhythm, normal heart sounds and intact distal pulses.  No murmur heard. Pulmonary/Chest: Effort normal and breath sounds normal. No respiratory distress. She has no wheezes.  Abdominal: Soft. Bowel sounds are normal. She exhibits no distension. There is no tenderness.  Musculoskeletal: She exhibits no edema or  tenderness.  Pain in lumbar area with flexion or extension  Neurological: She is alert and oriented to person, place,  and time. She has normal reflexes. No cranial nerve deficit.  Skin: Skin is warm and dry.  Psychiatric: She has a normal mood and affect. Her behavior is normal. Judgment and thought content normal.  Vitals reviewed.     BP (!) 158/99   Pulse 75   Temp (!) 97.2 F (36.2 C) (Oral)   Ht 5' 2" (1.575 m)   Wt 90 lb 6.4 oz (41 kg)   BMI 16.53 kg/m      Assessment & Plan:  SKILER TYE comes in today with chief complaint of Diabetes (three month recheck)   Diagnosis and orders addressed:  1. Chronic bilateral low back pain with right-sided sciatica - CMP14+EGFR - traMADol (ULTRAM) 50 MG tablet; Take 1-2 tablets (50-100 mg total) by mouth every 12 (twelve) hours as needed.  Dispense: 120 tablet; Refill: 1  2. Diabetes mellitus with complication (HCC) - Bayer DCA Hb A1c Waived - CMP14+EGFR - metFORMIN (GLUCOPHAGE) 850 MG tablet; Take 1 tablet (850 mg total) by mouth 2 (two) times daily with a meal.  Dispense: 90 tablet; Refill: 1  3. Gastroesophageal reflux disease without esophagitis - CMP14+EGFR  4. Bilateral hearing loss, unspecified hearing loss type - CMP14+EGFR  5. Hyperlipidemia associated with type 2 diabetes mellitus (HCC) - CMP14+EGFR  6. Moderate COPD (chronic obstructive pulmonary disease) (HCC) - CMP14+EGFR  7. Underweight - CMP14+EGFR  8. Vitamin D deficiency - CMP14+EGFR  9. Smoking - CMP14+EGFR  10. Lumbar radiculopathy, chronic - CMP14+EGFR - traMADol (ULTRAM) 50 MG tablet; Take 1-2 tablets (50-100 mg total) by mouth every 12 (twelve) hours as needed.  Dispense: 120 tablet; Refill: 1  11. Essential hypertension - amLODipine (NORVASC) 5 MG tablet; Take 1 tablet (5 mg total) by mouth daily.  Dispense: 90 tablet; Refill: 1  12. Hypoglycemia - metFORMIN (GLUCOPHAGE) 850 MG tablet; Take 1 tablet (850 mg total) by mouth 2 (two) times daily with a meal.  Dispense: 90 tablet; Refill: 1  Labs pending Health Maintenance reviewed Diet and exercise  encouraged  Follow up plan: 6 months    Evelina Dun, FNP

## 2018-05-05 NOTE — Patient Instructions (Signed)

## 2018-05-06 LAB — CMP14+EGFR
ALT: 6 IU/L (ref 0–32)
AST: 18 IU/L (ref 0–40)
Albumin/Globulin Ratio: 1.6 (ref 1.2–2.2)
Albumin: 4 g/dL (ref 3.6–4.8)
Alkaline Phosphatase: 82 IU/L (ref 39–117)
BILIRUBIN TOTAL: 0.3 mg/dL (ref 0.0–1.2)
BUN/Creatinine Ratio: 19 (ref 12–28)
BUN: 16 mg/dL (ref 8–27)
CALCIUM: 9.4 mg/dL (ref 8.7–10.3)
CO2: 19 mmol/L — ABNORMAL LOW (ref 20–29)
Chloride: 98 mmol/L (ref 96–106)
Creatinine, Ser: 0.85 mg/dL (ref 0.57–1.00)
GFR, EST AFRICAN AMERICAN: 83 mL/min/{1.73_m2} (ref >59)
GFR, EST NON AFRICAN AMERICAN: 72 mL/min/{1.73_m2} (ref >59)
GLOBULIN, TOTAL: 2.5 g/dL (ref 1.5–4.5)
Glucose: 178 mg/dL — ABNORMAL HIGH (ref 65–99)
POTASSIUM: 4.9 mmol/L (ref 3.5–5.2)
Sodium: 139 mmol/L (ref 134–144)
Total Protein: 6.5 g/dL (ref 6.0–8.5)

## 2018-05-07 ENCOUNTER — Other Ambulatory Visit: Payer: Self-pay | Admitting: Family

## 2018-05-07 MED ORDER — EMPAGLIFLOZIN 10 MG PO TABS: 10.0000 mg | ORAL_TABLET | Freq: Every day | ORAL | 1 refills | Status: DC

## 2018-05-12 ENCOUNTER — Telehealth: Payer: Self-pay

## 2018-05-12 NOTE — Telephone Encounter (Signed)
Several attempts have been made to contact patient without success. Patient will be placed on the inactive list.  If services are needed again.  Please contact [facility] at [phone].    Information will be routed to the PCP and Dr. Hisada  

## 2018-07-06 ENCOUNTER — Other Ambulatory Visit: Payer: Self-pay | Admitting: Family

## 2018-07-06 DIAGNOSIS — E118 Type 2 diabetes mellitus with unspecified complications: Secondary | ICD-10-CM

## 2018-07-06 DIAGNOSIS — E162 Hypoglycemia, unspecified: Secondary | ICD-10-CM

## 2018-07-06 MED ORDER — METFORMIN HCL 850 MG PO TABS: 850.0000 mg | ORAL_TABLET | Freq: Two times a day (BID) | ORAL | 0 refills | Status: DC

## 2018-07-06 NOTE — Telephone Encounter (Signed)
What is the name of the medication? metformin  Have you contacted your pharmacy to request a refill? yes  Which pharmacy would you like this sent to? [facility] mail [facility]    Patient notified that their request is being sent to the clinical staff for review and that they should receive a call once it is complete. If they do not receive a call within 24 hours they can check with their pharmacy or our office.

## 2018-07-07 NOTE — Telephone Encounter (Signed)
NA, closing encounter, refill sent on 07/06/18 to [facility]

## 2018-07-28 ENCOUNTER — Ambulatory Visit (INDEPENDENT_AMBULATORY_CARE_PROVIDER_SITE_OTHER): Payer: Medicare HMO

## 2018-07-28 DIAGNOSIS — Z23 Encounter for immunization: Secondary | ICD-10-CM | POA: Diagnosis not present

## 2018-08-24 ENCOUNTER — Other Ambulatory Visit: Payer: Self-pay | Admitting: Family

## 2018-08-24 DIAGNOSIS — I10 Essential (primary) hypertension: Secondary | ICD-10-CM

## 2018-09-02 DIAGNOSIS — J181 Lobar pneumonia, unspecified organism: Secondary | ICD-10-CM | POA: Diagnosis not present

## 2018-09-02 DIAGNOSIS — R509 Fever, unspecified: Secondary | ICD-10-CM | POA: Diagnosis not present

## 2018-09-02 DIAGNOSIS — J441 Chronic obstructive pulmonary disease with (acute) exacerbation: Secondary | ICD-10-CM | POA: Diagnosis not present

## 2018-09-02 DIAGNOSIS — R05 Cough: Secondary | ICD-10-CM | POA: Diagnosis not present

## 2018-09-04 ENCOUNTER — Ambulatory Visit: Payer: Medicare HMO | Admitting: Family

## 2018-09-07 ENCOUNTER — Encounter: Payer: Self-pay | Admitting: Family

## 2018-09-07 ENCOUNTER — Ambulatory Visit (INDEPENDENT_AMBULATORY_CARE_PROVIDER_SITE_OTHER): Payer: Medicare HMO | Admitting: Family

## 2018-09-07 VITALS — BP 137/92 | HR 98 | Temp 97.5°F | Ht 62.0 in | Wt 81.4 lb

## 2018-09-07 DIAGNOSIS — J449 Chronic obstructive pulmonary disease, unspecified: Secondary | ICD-10-CM | POA: Diagnosis not present

## 2018-09-07 DIAGNOSIS — E1169 Type 2 diabetes mellitus with other specified complication: Secondary | ICD-10-CM | POA: Diagnosis not present

## 2018-09-07 DIAGNOSIS — E785 Hyperlipidemia, unspecified: Secondary | ICD-10-CM | POA: Diagnosis not present

## 2018-09-07 DIAGNOSIS — E118 Type 2 diabetes mellitus with unspecified complications: Secondary | ICD-10-CM | POA: Diagnosis not present

## 2018-09-07 DIAGNOSIS — G8929 Other chronic pain: Secondary | ICD-10-CM | POA: Diagnosis not present

## 2018-09-07 DIAGNOSIS — E559 Vitamin D deficiency, unspecified: Secondary | ICD-10-CM | POA: Diagnosis not present

## 2018-09-07 DIAGNOSIS — J159 Unspecified bacterial pneumonia: Secondary | ICD-10-CM

## 2018-09-07 DIAGNOSIS — M5441 Lumbago with sciatica, right side: Secondary | ICD-10-CM | POA: Diagnosis not present

## 2018-09-07 DIAGNOSIS — K219 Gastro-esophageal reflux disease without esophagitis: Secondary | ICD-10-CM | POA: Diagnosis not present

## 2018-09-07 DIAGNOSIS — R636 Underweight: Secondary | ICD-10-CM | POA: Diagnosis not present

## 2018-09-07 LAB — BAYER DCA HB A1C WAIVED: HB A1C (BAYER DCA - WAIVED): 8.7 % — ABNORMAL HIGH (ref <7.0)

## 2018-09-07 MED ORDER — ALBUTEROL SULFATE (2.5 MG/3ML) 0.083% IN NEBU: 2.5000 mg | INHALATION_SOLUTION | Freq: Four times a day (QID) | RESPIRATORY_TRACT | 1 refills | Status: DC | PRN

## 2018-09-07 NOTE — Patient Instructions (Signed)

## 2018-09-07 NOTE — Progress Notes (Signed)
Subjective:    Patient ID: Jamie [name], female    DOB: [birth year], 67 y.o.   MRN: [id number]  Chief Complaint  Patient presents with  . Medical Management of Chronic Issues   Pt presents to the office today for chronic follow up. PT had surgical decompression and stabilization from L2-L4 on 02/05/17. Pt states her back continues to hurtand her pain is a10out 10 when she walks or standing.  PT went to the [place] on 11/20 and was diagnosed with left upper lobe pneumonia. She was given prednisone and started on Levaquin. She reports her breathing is slightly better, but continues to have coughing and wheezing.  Diabetes  She presents for her follow-up diabetic visit. She has type 2 diabetes mellitus. Her disease course has been worsening. There are no hypoglycemic associated symptoms. Pertinent negatives for diabetes include no blurred vision, no foot paresthesias and no visual change. There are no hypoglycemic complications. Symptoms are stable. Risk factors for coronary artery disease include diabetes mellitus, dyslipidemia, sedentary lifestyle and post-menopausal. She is following a generally healthy diet. Her overall blood glucose range is >200 mg/dl. Eye exam is current.  Gastroesophageal Reflux  She complains of coughing, heartburn, a hoarse voice and wheezing. She reports no belching. This is a chronic problem. The current episode started more than 1 year ago. The problem occurs occasionally. The problem has been waxing and waning. Risk factors include smoking/tobacco exposure. She has tried a diet change for the symptoms. The treatment provided mild relief.  Back Pain  This is a chronic problem. The current episode started more than 1 year ago. The problem occurs intermittently. The problem has been waxing and waning since onset. The pain is present in the gluteal. The quality of the pain is described as aching. The pain is at a severity of 10/10. The pain is moderate. The  treatment provided mild relief.  Hyperlipidemia  This is a chronic problem. The current episode started more than 1 year ago. The problem is controlled. Recent lipid tests were reviewed and are normal. Associated symptoms include shortness of breath. Current antihyperlipidemic treatment includes statins. The current treatment provides moderate improvement of lipids. Risk factors for coronary artery disease include dyslipidemia, diabetes mellitus, hypertension and a sedentary lifestyle.  COPD  PT currently has pneumonia, she uses Symbicort BID.    Review of Systems  HENT: Positive for hoarse voice.   Eyes: Negative for blurred vision.  Respiratory: Positive for cough, shortness of breath and wheezing.   Gastrointestinal: Positive for heartburn.  Musculoskeletal: Positive for arthralgias and back pain.  All other systems reviewed and are negative.      Objective:   Physical Exam  Constitutional: She is oriented to person, place, and time. She appears well-developed and well-nourished. No distress.  HENT:  Head: Normocephalic and atraumatic.  Right Ear: External ear normal.  Left Ear: External ear normal.  Mouth/Throat: Oropharynx is clear and moist.  Eyes: Pupils are equal, round, and reactive to light.  Neck: Normal range of motion. Neck supple. No thyromegaly present.  Cardiovascular: Normal rate, regular rhythm, normal heart sounds and intact distal pulses.  No murmur heard. Pulmonary/Chest: Effort normal. No respiratory distress. She has decreased breath sounds. She has wheezes. She has rhonchi.  Abdominal: Soft. Bowel sounds are normal. She exhibits no distension. There is no tenderness.  Musculoskeletal: Normal range of motion. She exhibits no edema or tenderness.  Neurological: She is alert and oriented to person, place, and time. She has normal  reflexes. No cranial nerve deficit.  Skin: Skin is warm and dry.  Psychiatric: She has a normal mood and affect. Her behavior is  normal. Judgment and thought content normal.  Vitals reviewed.   BP (!) 137/92   Pulse 98   Temp (!) 97.5 F (36.4 C) (Oral)   Ht _0  (1.575 m)   Wt 81 lb 6.4 oz (36.9 kg)   BMI 14.89 kg/m      Assessment & Plan:  Jamie [name] comes in today with chief complaint of Medical Management of Chronic Issues   Diagnosis and orders addressed:  1. Moderate COPD (chronic obstructive pulmonary disease) (HCC) - DME Nebulizer machine - albuterol (PROVENTIL) (2.5 MG/3ML) 0.083% nebulizer solution; Take 3 mLs (2.5 mg total) by nebulization every 6 (six) hours as needed for wheezing or shortness of breath.  Dispense: 150 mL; Refill: 1 - CMP14+EGFR - CBC with Differential/Platelet  2. Gastroesophageal reflux disease without esophagitis - CMP14+EGFR - CBC with Differential/Platelet  3. Hyperlipidemia associated with type 2 diabetes mellitus ([place]) - CMP14+EGFR - CBC with Differential/Platelet - Lipid panel  4. Diabetes mellitus with complication ([place]) - Bayer DCA Hb A1c Waived - CMP14+EGFR - CBC with Differential/Platelet  5. Vitamin D deficiency - CMP14+EGFR - CBC with Differential/Platelet  6. Chronic bilateral low back pain with right-sided sciatica - Ambulatory referral to Neurosurgery - CMP14+EGFR - CBC with Differential/Platelet  7. Community acquired bacterial pneumonia Continue Levaquin Follow up in 4 days - DME Nebulizer machine - albuterol (PROVENTIL) (2.5 MG/3ML) 0.083% nebulizer solution; Take 3 mLs (2.5 mg total) by nebulization every 6 (six) hours as needed for wheezing or shortness of breath.  Dispense: 150 mL; Refill: 1 - CMP14+EGFR - CBC with Differential/Platelet  8. Underweight   Labs pending Health Maintenance reviewed Diet and exercise encouraged  Follow up plan: 4 days to recheck CAP    Evelina Dun, FNP

## 2018-09-08 ENCOUNTER — Other Ambulatory Visit: Payer: Self-pay

## 2018-09-08 ENCOUNTER — Other Ambulatory Visit: Payer: Self-pay | Admitting: Family

## 2018-09-08 ENCOUNTER — Observation Stay (HOSPITAL_COMMUNITY)
Admission: EM | Admit: 2018-09-08 | Discharge: 2018-09-09 | Disposition: A | Discharge status: Home / Self Care | Payer: Medicare HMO | Attending: Internal Medicine | Admitting: Internal Medicine

## 2018-09-08 ENCOUNTER — Encounter (HOSPITAL_COMMUNITY): Payer: Self-pay | Admitting: Emergency Medicine

## 2018-09-08 ENCOUNTER — Emergency Department (HOSPITAL_COMMUNITY): Payer: Medicare HMO

## 2018-09-08 DIAGNOSIS — J189 Pneumonia, unspecified organism: Secondary | ICD-10-CM | POA: Diagnosis present

## 2018-09-08 DIAGNOSIS — J181 Lobar pneumonia, unspecified organism: Secondary | ICD-10-CM | POA: Diagnosis not present

## 2018-09-08 DIAGNOSIS — E1169 Type 2 diabetes mellitus with other specified complication: Secondary | ICD-10-CM | POA: Diagnosis present

## 2018-09-08 DIAGNOSIS — Z794 Long term (current) use of insulin: Secondary | ICD-10-CM | POA: Insufficient documentation

## 2018-09-08 DIAGNOSIS — F17210 Nicotine dependence, cigarettes, uncomplicated: Secondary | ICD-10-CM | POA: Diagnosis not present

## 2018-09-08 DIAGNOSIS — I10 Essential (primary) hypertension: Secondary | ICD-10-CM | POA: Diagnosis not present

## 2018-09-08 DIAGNOSIS — E559 Vitamin D deficiency, unspecified: Secondary | ICD-10-CM | POA: Diagnosis present

## 2018-09-08 DIAGNOSIS — I2584 Coronary atherosclerosis due to calcified coronary lesion: Secondary | ICD-10-CM

## 2018-09-08 DIAGNOSIS — I2510 Atherosclerotic heart disease of native coronary artery without angina pectoris: Secondary | ICD-10-CM | POA: Diagnosis not present

## 2018-09-08 DIAGNOSIS — B370 Candidal stomatitis: Secondary | ICD-10-CM | POA: Diagnosis present

## 2018-09-08 DIAGNOSIS — E785 Hyperlipidemia, unspecified: Secondary | ICD-10-CM

## 2018-09-08 DIAGNOSIS — R0602 Shortness of breath: Secondary | ICD-10-CM | POA: Diagnosis not present

## 2018-09-08 DIAGNOSIS — E43 Unspecified severe protein-calorie malnutrition: Secondary | ICD-10-CM | POA: Diagnosis not present

## 2018-09-08 DIAGNOSIS — R05 Cough: Secondary | ICD-10-CM | POA: Diagnosis not present

## 2018-09-08 DIAGNOSIS — Z79899 Other long term (current) drug therapy: Secondary | ICD-10-CM | POA: Diagnosis not present

## 2018-09-08 DIAGNOSIS — Z716 Tobacco abuse counseling: Secondary | ICD-10-CM

## 2018-09-08 DIAGNOSIS — A419 Sepsis, unspecified organism: Principal | ICD-10-CM | POA: Diagnosis present

## 2018-09-08 DIAGNOSIS — N289 Disorder of kidney and ureter, unspecified: Secondary | ICD-10-CM | POA: Diagnosis not present

## 2018-09-08 DIAGNOSIS — R531 Weakness: Secondary | ICD-10-CM | POA: Diagnosis present

## 2018-09-08 DIAGNOSIS — J441 Chronic obstructive pulmonary disease with (acute) exacerbation: Secondary | ICD-10-CM | POA: Diagnosis present

## 2018-09-08 DIAGNOSIS — E119 Type 2 diabetes mellitus without complications: Secondary | ICD-10-CM | POA: Insufficient documentation

## 2018-09-08 DIAGNOSIS — E0865 Diabetes mellitus due to underlying condition with hyperglycemia: Secondary | ICD-10-CM | POA: Diagnosis present

## 2018-09-08 DIAGNOSIS — D649 Anemia, unspecified: Secondary | ICD-10-CM | POA: Diagnosis not present

## 2018-09-08 LAB — I-STAT CG4 LACTIC ACID, ED
Lactic Acid, Venous: 3.99 mmol/L (ref 0.5–1.9)
Lactic Acid, Venous: 2.3 mmol/L (ref 0.5–1.9)

## 2018-09-08 LAB — CBC WITH DIFFERENTIAL/PLATELET
ABS IMMATURE GRANULOCYTES: 0.77 10*3/uL — AB (ref 0.00–0.07)
BASOS ABS: 0.1 10*3/uL (ref 0.0–0.2)
BASOS PCT: 0 %
Basophils Absolute: 0.1 10*3/uL (ref 0.0–0.1)
Basos: 1 %
EOS (ABSOLUTE): 0 10*3/uL (ref 0.0–0.4)
Eos: 0 %
Eosinophils Absolute: 0 10*3/uL (ref 0.0–0.5)
Eosinophils Relative: 0 %
HCT: 33.9 % — ABNORMAL LOW (ref 36.0–46.0)
Hematocrit: 37.9 % (ref 34.0–46.6)
Hemoglobin: 12.6 g/dL (ref 11.1–15.9)
Hemoglobin: 11 g/dL — ABNORMAL LOW (ref 12.0–15.0)
IMMATURE GRANS (ABS): 0.7 10*3/uL — AB (ref 0.0–0.1)
IMMATURE GRANULOCYTES: 4 %
IMMATURE GRANULOCYTES: 4 %
LYMPHS: 5 %
Lymphocytes Absolute: 1.1 10*3/uL (ref 0.7–3.1)
Lymphocytes Relative: 8 %
Lymphs Abs: 1.4 10*3/uL (ref 0.7–4.0)
MCH: 28.4 pg (ref 26.6–33.0)
MCH: 28.7 pg (ref 26.0–34.0)
MCHC: 33.2 g/dL (ref 31.5–35.7)
MCHC: 32.4 g/dL (ref 30.0–36.0)
MCV: 85 fL (ref 79–97)
MCV: 88.5 fL (ref 80.0–100.0)
MONOS ABS: 0.8 10*3/uL (ref 0.1–0.9)
Monocytes Absolute: 0.9 10*3/uL (ref 0.1–1.0)
Monocytes Relative: 5 %
Monocytes: 4 %
NEUTROS ABS: 14.5 10*3/uL — AB (ref 1.7–7.7)
NEUTROS PCT: 86 %
Neutrophils Absolute: 18 10*3/uL — ABNORMAL HIGH (ref 1.4–7.0)
Neutrophils Relative %: 83 %
PLATELETS: 584 10*3/uL — AB (ref 150–450)
PLATELETS: 430 10*3/uL — AB (ref 150–400)
RBC: 4.44 x10E6/uL (ref 3.77–5.28)
RBC: 3.83 MIL/uL — AB (ref 3.87–5.11)
RDW: 12.5 % (ref 12.3–15.4)
RDW: 13.4 % (ref 11.5–15.5)
WBC: 20.7 10*3/uL (ref 3.4–10.8)
WBC: 17.6 10*3/uL — AB (ref 4.0–10.5)
nRBC: 0 % (ref 0.0–0.2)

## 2018-09-08 LAB — BASIC METABOLIC PANEL
Anion gap: 11 (ref 5–15)
BUN: 22 mg/dL (ref 8–23)
CHLORIDE: 92 mmol/L — AB (ref 98–111)
CO2: 26 mmol/L (ref 22–32)
Calcium: 8.6 mg/dL — ABNORMAL LOW (ref 8.9–10.3)
Creatinine, Ser: 1.09 mg/dL — ABNORMAL HIGH (ref 0.44–1.00)
GFR, EST NON AFRICAN AMERICAN: 53 mL/min — AB (ref >60)
Glucose, Bld: 395 mg/dL — ABNORMAL HIGH (ref 70–99)
Potassium: 4.4 mmol/L (ref 3.5–5.1)
SODIUM: 129 mmol/L — AB (ref 135–145)

## 2018-09-08 LAB — CMP14+EGFR
A/G RATIO: 1.5 (ref 1.2–2.2)
ALT: 11 IU/L (ref 0–32)
AST: 7 IU/L (ref 0–40)
Albumin: 3.7 g/dL (ref 3.6–4.8)
Alkaline Phosphatase: 94 IU/L (ref 39–117)
BILIRUBIN TOTAL: 0.2 mg/dL (ref 0.0–1.2)
BUN/Creatinine Ratio: 23 (ref 12–28)
BUN: 24 mg/dL (ref 8–27)
CHLORIDE: 90 mmol/L — AB (ref 96–106)
CO2: 24 mmol/L (ref 20–29)
Calcium: 9.4 mg/dL (ref 8.7–10.3)
Creatinine, Ser: 1.03 mg/dL — ABNORMAL HIGH (ref 0.57–1.00)
GFR calc Af Amer: 65 mL/min/{1.73_m2} (ref >59)
GFR calc non Af Amer: 57 mL/min/{1.73_m2} — ABNORMAL LOW (ref >59)
GLOBULIN, TOTAL: 2.5 g/dL (ref 1.5–4.5)
Glucose: 431 mg/dL (ref 65–99)
POTASSIUM: 5.2 mmol/L (ref 3.5–5.2)
SODIUM: 132 mmol/L — AB (ref 134–144)
Total Protein: 6.2 g/dL (ref 6.0–8.5)

## 2018-09-08 LAB — URINALYSIS, ROUTINE W REFLEX MICROSCOPIC
BACTERIA UA: NONE SEEN
BILIRUBIN URINE: NEGATIVE
Glucose, UA: >=500 mg/dL — AB
Hgb urine dipstick: NEGATIVE
Ketones, ur: NEGATIVE mg/dL
Leukocytes, UA: NEGATIVE
NITRITE: NEGATIVE
PH: 5 (ref 5.0–8.0)
Protein, ur: NEGATIVE mg/dL
SPECIFIC GRAVITY, URINE: 1.021 (ref 1.005–1.030)

## 2018-09-08 LAB — GLUCOSE, CAPILLARY
Glucose-Capillary: 370 mg/dL — ABNORMAL HIGH (ref 70–99)
Glucose-Capillary: 231 mg/dL — ABNORMAL HIGH (ref 70–99)

## 2018-09-08 LAB — LIPID PANEL
CHOLESTEROL TOTAL: 103 mg/dL (ref 100–199)
Chol/HDL Ratio: 4 ratio (ref 0.0–4.4)
HDL: 26 mg/dL — ABNORMAL LOW (ref >39)
LDL Calculated: 44 mg/dL (ref 0–99)
Triglycerides: 164 mg/dL — ABNORMAL HIGH (ref 0–149)
VLDL CHOLESTEROL CAL: 33 mg/dL (ref 5–40)

## 2018-09-08 LAB — LACTIC ACID, PLASMA: LACTIC ACID, VENOUS: 2.5 mmol/L — AB (ref 0.5–1.9)

## 2018-09-08 MED ORDER — SODIUM CHLORIDE 0.9 % IV SOLN
2.0000 g | INTRAVENOUS | Status: DC
  Administered 2018-09-08: 2 g via INTRAVENOUS
  Filled 2018-09-08: qty 20

## 2018-09-08 MED ORDER — POLYETHYLENE GLYCOL 3350 17 G PO PACK: 17.0000 g | PACK | Freq: Every day | ORAL | Status: DC | PRN

## 2018-09-08 MED ORDER — SODIUM CHLORIDE 0.9 % IV BOLUS
1000.0000 mL | Freq: Once | INTRAVENOUS | Status: DC
  Administered 2018-09-08: 1000 mL via INTRAVENOUS

## 2018-09-08 MED ORDER — ENOXAPARIN SODIUM 30 MG/0.3ML ~~LOC~~ SOLN
30.0000 mg | SUBCUTANEOUS | Status: DC
  Administered 2018-09-08: 30 mg via SUBCUTANEOUS
  Filled 2018-09-08: qty 0.3

## 2018-09-08 MED ORDER — IPRATROPIUM-ALBUTEROL 0.5-2.5 (3) MG/3ML IN SOLN: 3.0000 mL | RESPIRATORY_TRACT | Status: DC | PRN

## 2018-09-08 MED ORDER — SODIUM CHLORIDE 0.9 % IV SOLN
500.0000 mg | INTRAVENOUS | Status: DC
  Administered 2018-09-08: 500 mg via INTRAVENOUS
  Filled 2018-09-08: qty 500

## 2018-09-08 MED ORDER — SODIUM CHLORIDE 0.9 % IV BOLUS (SEPSIS)
250.0000 mL | Freq: Once | INTRAVENOUS | Status: AC
  Administered 2018-09-08: 250 mL via INTRAVENOUS

## 2018-09-08 MED ORDER — LOSARTAN POTASSIUM 50 MG PO TABS
100.0000 mg | ORAL_TABLET | Freq: Every day | ORAL | Status: DC
  Administered 2018-09-08: 100 mg via ORAL
  Filled 2018-09-08: qty 2

## 2018-09-08 MED ORDER — SODIUM CHLORIDE 0.9 % IV BOLUS
500.0000 mL | Freq: Once | INTRAVENOUS | Status: AC
  Administered 2018-09-08: 500 mL via INTRAVENOUS

## 2018-09-08 MED ORDER — SODIUM CHLORIDE 0.9 % IV BOLUS (SEPSIS)
1000.0000 mL | Freq: Once | INTRAVENOUS | Status: AC
  Administered 2018-09-08: 1000 mL via INTRAVENOUS

## 2018-09-08 MED ORDER — LEVOFLOXACIN IN D5W 500 MG/100ML IV SOLN: 500.0000 mg | INTRAVENOUS | Status: DC

## 2018-09-08 MED ORDER — ONDANSETRON HCL 4 MG/2ML IJ SOLN: 4.0000 mg | Freq: Four times a day (QID) | INTRAMUSCULAR | Status: DC | PRN

## 2018-09-08 MED ORDER — ONDANSETRON HCL 4 MG PO TABS: 4.0000 mg | ORAL_TABLET | Freq: Four times a day (QID) | ORAL | Status: DC | PRN

## 2018-09-08 MED ORDER — NYSTATIN 100000 UNIT/ML MT SUSP
5.0000 mL | Freq: Four times a day (QID) | OROMUCOSAL | Status: DC
  Administered 2018-09-08 – 2018-09-09 (×3): 500000 [IU] via ORAL
  Filled 2018-09-08 (×3): qty 5

## 2018-09-08 MED ORDER — ACETAMINOPHEN 325 MG PO TABS: 650.0000 mg | ORAL_TABLET | Freq: Four times a day (QID) | ORAL | Status: DC | PRN

## 2018-09-08 MED ORDER — ACETAMINOPHEN 650 MG RE SUPP: 650.0000 mg | Freq: Four times a day (QID) | RECTAL | Status: DC | PRN

## 2018-09-08 MED ORDER — INSULIN ASPART 100 UNIT/ML ~~LOC~~ SOLN
0.0000 [IU] | Freq: Three times a day (TID) | SUBCUTANEOUS | Status: DC
  Administered 2018-09-09: 5 [IU] via SUBCUTANEOUS

## 2018-09-08 MED ORDER — INSULIN ASPART 100 UNIT/ML ~~LOC~~ SOLN
5.0000 [IU] | Freq: Once | SUBCUTANEOUS | Status: AC
  Administered 2018-09-08: 5 [IU] via SUBCUTANEOUS

## 2018-09-08 MED ORDER — SODIUM CHLORIDE 0.9 % IV SOLN
INTRAVENOUS | Status: AC
  Administered 2018-09-08 – 2018-09-09 (×3): via INTRAVENOUS

## 2018-09-08 NOTE — ED Notes (Signed)
Given peanut butter graham crackers and diet sprite

## 2018-09-08 NOTE — ED Notes (Signed)
CRITICAL RESULT  09/08/2018 1714  CG4+  Result: 2.30  MD Notified: [employer]

## 2018-09-08 NOTE — ED Triage Notes (Signed)
Patient's family member states patient was sent here by Dr Lenna Gilford for increased WBC. States she was diagnosed with pneumonia on 09/02/18 and is still taking antibiotics.

## 2018-09-08 NOTE — Progress Notes (Signed)
Pateint's LA remains elevated, MD aware.

## 2018-09-08 NOTE — ED Provider Notes (Signed)
[facility] EMERGENCY DEPARTMENT Provider Note   CSN: [id number] Arrival date & time: 09/08/18  1412     History   Chief Complaint Chief Complaint  Patient presents with  . Abnormal Lab    HPI Jamie [name] is a 67 y.o. female.  HPI Patient reports cough and generalized weakness for approximately 1 week.  She states cough is somewhat improving however she has diminished appetite and feels generally weak  progressively weakening over the past 1 week.  She had fever 1 week ago she was diagnosed at an urgent care center with pneumonia about a week ago.  Treated with Levaquin  And albuterol nebulizeer. patient reports that today she was called by her PMDs office and told to come to the emergency department because she had elevated white blood cell count no other associated symptoms.  Nothing makes symptoms better or worse. Past Medical History:  Diagnosis Date  . Breast cancer ([name])   . COPD (chronic obstructive pulmonary disease) ([name])   . Diabetes ([name])   . HTN (hypertension)   . Hyperlipidemia   . Personal history of radiation therapy   . Vitamin D deficiency     Patient Active Problem List   Diagnosis Date Noted  . Lumbar radiculopathy, chronic 02/03/2017  . Diabetes mellitus with complication ([name])   . Chronic back pain 10/03/2016  . Underweight 05/02/2016  . GERD (gastroesophageal reflux disease) 02/13/2015  . Hard of hearing 02/10/2014  . Hyperlipidemia associated with type 2 diabetes mellitus ([name])   . Vitamin D deficiency   . Coronary artery calcification 08/31/2013  . Oral thrush 03/30/2013  . Lung nodule, right upper lobe 8 mm March 2014 02/04/2013  . Moderate COPD (chronic obstructive pulmonary disease) ([place]) 02/04/2013  . Smoking 02/04/2013    Past Surgical History:  Procedure Laterality Date  . BREAST SURGERY    . CARPAL TUNNEL RELEASE    . CERVICAL SPINE SURGERY    . EYE SURGERY Bilateral    cataracts  . GALLBLADDER SURGERY    . INNER EAR SURGERY     . SPINE SURGERY       OB History   None      Home Medications    Prior to Admission medications   Medication Sig Start Date End Date Taking? Authorizing Provider  ACCU-CHEK AVIVA PLUS test strip USE AS INSTRUCTED 12/02/17   Sharion Balloon, FNP  ACCU-CHEK FASTCLIX LANCETS MISC Use to check blood sugar two times daily  Dx Code E11.9 01/13/15   Wardell Honour, MD  albuterol (PROVENTIL HFA;VENTOLIN HFA) 108 (90 BASE) MCG/ACT inhaler Inhale 2 puffs into the lungs every 6 (six) hours as needed for wheezing or shortness of breath. 12/12/14   Evelina Dun A, FNP  albuterol (PROVENTIL) (2.5 MG/3ML) 0.083% nebulizer solution Take 3 mLs (2.5 mg total) by nebulization every 6 (six) hours as needed for wheezing or shortness of breath. 09/07/18   Evelina Dun A, FNP  amLODipine (NORVASC) 5 MG tablet Take 1 tablet (5 mg total) by mouth daily. 05/05/18   Evelina Dun A, FNP  aspirin EC 81 MG tablet Take 1 tablet (81 mg total) by mouth daily. 01/30/16   Sharion Balloon, FNP  BD INSULIN SYRINGE U/F 31G X 5/16" 0.5 ML MISC USE TWICE DAILY 12/02/17   Evelina Dun A, FNP  Blood Glucose Monitoring Suppl (ACCU-CHEK AVIVA PLUS) W/DEVICE KIT 1 Device by Does not apply route 2 (two) times daily. 09/30/14   Wardell Honour, MD  budesonide-formoterol (SYMBICORT) 160-4.5 MCG/ACT inhaler Inhale 2 puffs into the lungs 2 (two) times daily. 10/30/17   Evelina Dun A, FNP  empagliflozin (JARDIANCE) 10 MG TABS tablet Take 10 mg by mouth daily. 05/07/18   Sharion Balloon, FNP  gabapentin (NEURONTIN) 100 MG capsule Take 1 capsule (100 mg total) by mouth 3 (three) times daily. 05/05/18   Sharion Balloon, FNP  levofloxacin (LEVAQUIN) 500 MG tablet TAKE 1 TABLET BY MOUTH ONCE DAILY FOR 10 DAYS 09/02/18   [provider]  losartan (COZAAR) 100 MG tablet TAKE 1 TABLET EVERY DAY 08/25/18   Evelina Dun A, FNP  metFORMIN (GLUCOPHAGE) 850 MG tablet Take 1 tablet (850 mg total) by mouth 2 (two) times daily with a  meal. 07/06/18   Hawks, Christy A, FNP  ondansetron (ZOFRAN) 4 MG tablet Take 1 tablet (4 mg total) by mouth every 8 (eight) hours as needed for nausea or vomiting. 03/10/18   Evelina Dun A, FNP  pravastatin (PRAVACHOL) 20 MG tablet TAKE 1 TABLET EVERY DAY 08/25/18   Hawks, Christy A, FNP  predniSONE (DELTASONE) 5 MG tablet TAKE 6 TABLETS ON DAY 1 THEN TAKE 5 TABLETS ON DAY 2 THEN TAKE 4 TABLETS ON DAY 3 THEN TAKE 3 TABLETS ON DAY 4 THEN TAKE 2 TABLETS ON DAY 5 09/02/18   [provider]  sertraline (ZOLOFT) 25 MG tablet Take 1 tablet (25 mg total) by mouth daily for 14 days, THEN 2 tablets (50 mg total) daily for 14 days. 02/05/18 03/17/18  Sharion Balloon, FNP  tiotropium (SPIRIVA HANDIHALER) 18 MCG inhalation capsule Place 1 capsule (18 mcg total) into inhaler and inhale daily. 10/30/17   Sharion Balloon, FNP  traMADol (ULTRAM) 50 MG tablet Take 1-2 tablets (50-100 mg total) by mouth every 12 (twelve) hours as needed. 05/05/18   Sharion Balloon, FNP  Vitamin D, Ergocalciferol, (DRISDOL) 50000 units CAPS capsule Take 1 capsule (50,000 Units total) by mouth every 7 (seven) days. 02/09/18   Sharion Balloon, FNP    Family History Family History  Problem Relation Age of Onset  . Heart attack Mother   . Stroke Mother   . Stroke Sister   . Diabetes Unknown        sister x3, and mother  . Lung disease Father        worked in [employer]  . Colon cancer Neg Hx   . Colon polyps Neg Hx     Social History Social History   Tobacco Use  . Smoking status: Current Every Day Smoker    Packs/day: 0.25    Years: 45.00    Pack years: 11.25    Types: Cigarettes  . Smokeless tobacco: Never Used  Substance Use Topics  . Alcohol use: No  . Drug use: No     Allergies   Lipitor [atorvastatin] and Soma [carisoprodol]   Review of Systems Review of Systems  Constitutional: Positive for appetite change and fever.  HENT: Positive for hearing loss.        Chronically hard of hearing    Respiratory: Positive for cough.   Allergic/Immunologic: Positive for immunocompromised state.       Diabetic  Neurological: Positive for weakness.  All other systems reviewed and are negative.    Physical Exam Updated Vital Signs BP (!) 130/95 (BP Location: Right Arm)   Pulse (!) 110   Temp 98.4 F (36.9 C) (Oral)   Resp 16   Ht '5\' 2"'  (1.575 m)  Wt 36.9 kg   SpO2 95%   BMI 14.89 kg/m   Physical Exam  Constitutional: She is oriented to person, place, and time. She appears well-developed and well-nourished. No distress.  HENT:  Head: Normocephalic and atraumatic.  Mucous membranes dry  Eyes: Pupils are equal, round, and reactive to light. Conjunctivae are normal.  Neck: Neck supple. No tracheal deviation present. No thyromegaly present.  Cardiovascular: Normal rate and regular rhythm.  No murmur heard. Heart rate counted at 84 bpm by me  Pulmonary/Chest: Effort normal and breath sounds normal.  Abdominal: Soft. Bowel sounds are normal. She exhibits no distension. There is no tenderness.  Musculoskeletal: Normal range of motion. She exhibits no edema or tenderness.  Neurological: She is alert and oriented to person, place, and time. Coordination normal.  Skin: Skin is warm and dry. No rash noted.  Psychiatric: She has a normal mood and affect.  Nursing note and vitals reviewed.    ED Treatments / Results  Labs (all labs ordered are listed, but only abnormal results are displayed) Labs Reviewed  CULTURE, BLOOD (ROUTINE X 2)  CULTURE, BLOOD (ROUTINE X 2)  BASIC METABOLIC PANEL  CBC WITH DIFFERENTIAL/PLATELET  I-STAT CG4 LACTIC ACID, ED    EKG EKG Interpretation  Date/Time:  Tuesday September 08 2018 15:48:35 EST Ventricular Rate:  89 PR Interval:    QRS Duration: 98 QT Interval:  340 QTC Calculation: 414 R Axis:   77 Text Interpretation:  Sinus rhythm Consider anterior infarct Minimal ST elevation, inferior leads Baseline wander in lead(s) V2 No significant  change since last tracing Confirmed by Orlie Dakin [phone]) on 09/08/2018 3:51:48 PM   Radiology No results found.  Procedures Procedures (including critical care time)  Medications Ordered in ED Medications  sodium chloride 0.9 % bolus 1,000 mL (has no administration in time range)   5:10 PM feels improved after treatment with intravenous antibiotics and intravenous normal saline bolus. Sepsis - Repeat Assessment  Performed at:    510pm  Vitals     Blood pressure 111/82, pulse 98, temperature 98.4 F (36.9 C), temperature source Oral, resp. rate 17, height '5\' 2"'  (1.575 m), weight 36.9 kg, SpO2 96 %.  Heart:     Regular rate and rhythm  Lungs:    Rhonchi  Capillary Refill:   <2 sec  Peripheral Pulse:   Radial pulse palpable  Skin:     Normal Color  Results for orders placed or performed during the hospital encounter of 93/26/71  Basic metabolic panel  Result Value Ref Range   Sodium 129 (L) 135 - 145 mmol/L   Potassium 4.4 3.5 - 5.1 mmol/L   Chloride 92 (L) 98 - 111 mmol/L   CO2 26 22 - 32 mmol/L   Glucose, Bld 395 (H) 70 - 99 mg/dL   BUN 22 8 - 23 mg/dL   Creatinine, Ser 1.09 (H) 0.44 - 1.00 mg/dL   Calcium 8.6 (L) 8.9 - 10.3 mg/dL   GFR calc non Af Amer 53 (L) >60 mL/min   GFR calc Af Amer >60 >60 mL/min   Anion gap 11 5 - 15  CBC with Differential/Platelet  Result Value Ref Range   WBC 17.6 (H) 4.0 - 10.5 K/uL   RBC 3.83 (L) 3.87 - 5.11 MIL/uL   Hemoglobin 11.0 (L) 12.0 - 15.0 g/dL   HCT 33.9 (L) 36.0 - 46.0 %   MCV 88.5 80.0 - 100.0 fL   MCH 28.7 26.0 - 34.0 pg   MCHC 32.4  30.0 - 36.0 g/dL   RDW 13.4 11.5 - 15.5 %   Platelets 430 (H) 150 - 400 K/uL   nRBC 0.0 0.0 - 0.2 %   Neutrophils Relative % 83 %   Neutro Abs 14.5 (H) 1.7 - 7.7 K/uL   Lymphocytes Relative 8 %   Lymphs Abs 1.4 0.7 - 4.0 K/uL   Monocytes Relative 5 %   Monocytes Absolute 0.9 0.1 - 1.0 K/uL   Eosinophils Relative 0 %   Eosinophils Absolute 0.0 0.0 - 0.5 K/uL   Basophils  Relative 0 %   Basophils Absolute 0.1 0.0 - 0.1 K/uL   Immature Granulocytes 4 %   Abs Immature Granulocytes 0.77 (H) 0.00 - 0.07 K/uL  I-Stat CG4 Lactic Acid, ED  Result Value Ref Range   Lactic Acid, Venous 3.99 (HH) 0.5 - 1.9 mmol/L   Comment NOTIFIED PHYSICIAN   I-Stat CG4 Lactic Acid, ED  Result Value Ref Range   Lactic Acid, Venous 2.30 (HH) 0.5 - 1.9 mmol/L   Comment NOTIFIED PHYSICIAN    Dg Chest 2 View  Result Date: 09/08/2018 CLINICAL DATA:  Weakness, shortness of breath, congestion and cough EXAM: CHEST - 2 VIEW COMPARISON:  Chest x-ray of 09/02/2017 and CT chest of 08/25/2013 FINDINGS: On the frontal view there is hazy opacity peripherally in the left mid lung with prominent markings also at the left lung base. On the lateral view some of these markings appear to be the within the anterior inferior left upper lobe and lingula possibly representing pneumonia. Follow-up after interval treatment is recommended. The right lung is clear. No pleural effusion is seen. Mediastinal and hilar contours are unremarkable. The heart is within normal limits in size. No bony abnormality is seen. IMPRESSION: Opacity within the anterior inferior left upper lobe and lingula most consistent with pneumonia. Recommend follow-up to ensure clearing. Electronically Signed   By: Ivar Drape M.D.   On: 09/08/2018 16:22    Initial Impression / Assessment and Plan / ED Course  I have reviewed the triage vital signs and the nursing notes.  Pertinent labs & imaging results that were available during my care of the patient were reviewed by me and considered in my medical decision making (see chart for details).     Code sepsis called based on sirs criteria leukocytosis, tachycardia.  Source of infection felt to be respiratory. Lab work remarkable for leukocytosis, hyperglycemia, mild renal insufficiency and anemia.  Patient was mildly anemic 1 year ago mild mild renal insufficiency unchanged from  yesterday.  Mild renal insufficiency likely secondary to dehydration.  I consulted Dr.Emokpoe will arrange for overnight stay Final Clinical Impressions(s) / ED Diagnoses  Diagnoses #1 community-acquired pneumonia, multi lobar #2 sepsis #3 hyperglycemia #4 anemia #5 renal insufficiency CRITICAL CARE Performed by: Orlie Dakin Total critical care time: 35 minutes Critical care time was exclusive of separately billable procedures and treating other patients. Critical care was necessary to treat or prevent imminent or life-threatening deterioration. Critical care was time spent personally by me on the following activities: development of treatment plan with patient and/or surrogate as well as nursing, discussions with consultants, evaluation of patient's response to treatment, examination of patient, obtaining history from patient or surrogate, ordering and performing treatments and interventions, ordering and review of laboratory studies, ordering and review of radiographic studies, pulse oximetry and re-evaluation of patient's condition. Final diagnoses:  None    ED Discharge Orders    None       Blackey, Tilak Oakley,  MD 09/08/18 1730

## 2018-09-08 NOTE — H&P (Addendum)
History and Physical    Jamie [name] UDJ:[id number] DOB: Aug 01, 1951 DOA: 09/08/2018  PCP: Sharion Balloon, FNP   Patient coming from: Home   Chief Complaint: Generalized weakness  HPI: Jamie [name] is a 67 y.o. female with medical history significant for COPD,  CAD, DM2, HTN, breast cancer.  She presented to the ED with complaints of generalized weakness over the past week.  Patient was recently diagnosed with pneumonia at an urgent care- 11/20, symptoms included productive cough-sputum, shortness of breath.  She was prescribed Levaquin-patient said 7-day course, last dose tomorrow.  Reports compliance with medication.  Her cough and difficulty breathing has significantly improved.  Barely coughed today. She denies fevers. She reports poor p.o. intake over the past 5 days.  With nausea.  No abdominal pain no vomiting no loose stools. She reports dysuria over the past 5 days. Patient saw her primary care provider yesterday, was told to continue antibiotics , blood work showed WBC 20 patient was called and told to come to the ED.  ED Course: Tachycardic to 130 O2 sats greater than 95% on room air.  Afebrile 98.4.  WBC- 17.  Lactic acidosis 3.99 >> 2.3 after 1.25L  fluid bolus.  IV ceftriaxone and azithromycin was started.  Blood and urine cultures were obtained.  Hospitalist to admit for  Generalized weakness, dehydration, pneumonia with lactic acidosis.  Review of Systems: As per HPI all other systems reviewed and negative  Past Medical History:  Diagnosis Date  . Breast cancer ([place])   . COPD (chronic obstructive pulmonary disease) ([place])   . Diabetes ([place])   . HTN (hypertension)   . Hyperlipidemia   . Personal history of radiation therapy   . Vitamin D deficiency     Past Surgical History:  Procedure Laterality Date  . BREAST SURGERY    . CARPAL TUNNEL RELEASE    . CERVICAL SPINE SURGERY    . EYE SURGERY Bilateral    cataracts  . GALLBLADDER SURGERY    . INNER EAR SURGERY      . SPINE SURGERY       reports that she has been smoking cigarettes. She has a 11.25 pack-year smoking history. She has never used smokeless tobacco. She reports that she does not drink alcohol or use drugs.  Allergies  Allergen Reactions  . Lipitor [Atorvastatin] Other (See Comments)    Constipation   . Soma [Carisoprodol] Nausea And Vomiting    Family History  Problem Relation Age of Onset  . Heart attack Mother   . Stroke Mother   . Stroke Sister   . Diabetes Unknown        sister x3, and mother  . Lung disease Father        worked in [employer]  . Colon cancer Neg Hx   . Colon polyps Neg Hx     Prior to Admission medications   Medication Sig Start Date End Date Taking? Authorizing Provider  albuterol (PROVENTIL HFA;VENTOLIN HFA) 108 (90 BASE) MCG/ACT inhaler Inhale 2 puffs into the lungs every 6 (six) hours as needed for wheezing or shortness of breath. 12/12/14  Yes Hawks, Christy A, FNP  albuterol (PROVENTIL) (2.5 MG/3ML) 0.083% nebulizer solution Take 3 mLs (2.5 mg total) by nebulization every 6 (six) hours as needed for wheezing or shortness of breath. 09/07/18  Yes Hawks, Christy A, FNP  amLODipine (NORVASC) 5 MG tablet Take 1 tablet (5 mg total) by mouth daily. 05/05/18  Yes Hawks, [place] A,  FNP  levofloxacin (LEVAQUIN) 500 MG tablet Take 500 mg by mouth daily. 10 day course starting on 09/02/2018 09/02/18  Yes [provider]  losartan (COZAAR) 100 MG tablet TAKE 1 TABLET EVERY DAY Patient taking differently: Take 100 mg by mouth every evening.  08/25/18  Yes Hawks, Christy A, FNP  metFORMIN (GLUCOPHAGE) 850 MG tablet Take 1 tablet (850 mg total) by mouth 2 (two) times daily with a meal. 07/06/18  Yes Hawks, Christy A, FNP  pravastatin (PRAVACHOL) 20 MG tablet TAKE 1 TABLET EVERY DAY Patient taking differently: Take 20 mg by mouth daily.  08/25/18  Yes Evelina Dun A, FNP  ACCU-CHEK AVIVA PLUS test strip USE AS INSTRUCTED 12/02/17   Sharion Balloon, FNP   ACCU-CHEK FASTCLIX LANCETS MISC Use to check blood sugar two times daily  Dx Code E11.9 01/13/15   Wardell Honour, MD  BD INSULIN SYRINGE U/F 31G X 5/16" 0.5 ML MISC USE TWICE DAILY 12/02/17   Evelina Dun A, FNP  Blood Glucose Monitoring Suppl (ACCU-CHEK AVIVA PLUS) W/DEVICE KIT 1 Device by Does not apply route 2 (two) times daily. 09/30/14   Wardell Honour, MD  predniSONE (DELTASONE) 5 MG tablet TAKE 6 TABLETS ON DAY 1 THEN TAKE 5 TABLETS ON DAY 2 THEN TAKE 4 TABLETS ON DAY 3 THEN TAKE 3 TABLETS ON DAY 4 THEN TAKE 2 TABLETS ON DAY 5 09/02/18   [provider]    Physical Exam: Vitals:   09/08/18 1645 09/08/18 1700 09/08/18 1706 09/08/18 1800  BP:  111/82  123/72  Pulse: 87 98 71   Resp: '19 17 20 ' (!) 22  Temp:      TempSrc:      SpO2: 96% 96% 96%   Weight:      Height:        Constitutional: NAD, calm, comfortable, thin, spouse at bedside patient has improved since fluid bolus Vitals:   09/08/18 1645 09/08/18 1700 09/08/18 1706 09/08/18 1800  BP:  111/82  123/72  Pulse: 87 98 71   Resp: '19 17 20 ' (!) 22  Temp:      TempSrc:      SpO2: 96% 96% 96%   Weight:      Height:       Eyes: PERRL, lids and conjunctivae normal ENMT: Mucous membranes are dry with whitish exudates/coating on mucus membrane. Posterior pharynx clear of any exudate or lesions. Dentures.  Neck: normal, supple, no masses, no thyromegaly Respiratory: clear to auscultation bilaterally, no wheezing, no crackles. Normal respiratory effort. No accessory muscle use.  Cardiovascular: Regular rate and rhythm, no murmurs / rubs / gallops. No extremity edema. 2+ pedal pulses. No carotid bruits.  Abdomen: no tenderness, no masses palpated. No hepatosplenomegaly. Bowel sounds positive.  Musculoskeletal: no clubbing / cyanosis. No joint deformity upper and lower extremities. Good ROM, no contractures. Normal muscle tone.  Skin: no rashes, lesions, ulcers. No induration Neurologic: CN 2-12 grossly intact.  Sensation intact, DTR normal. Strength 5/5 in all 4.  Psychiatric: Normal judgment and insight. Alert and oriented x 3. Normal mood.   Labs on Admission: I have personally reviewed following labs and imaging studies  CBC: Recent Labs  Lab 09/07/18 1032 09/08/18 1525  WBC 20.7* 17.6*  NEUTROABS 18.0* 14.5*  HGB 12.6 11.0*  HCT 37.9 33.9*  MCV 85 88.5  PLT 584* 237*   Basic Metabolic Panel: Recent Labs  Lab 09/07/18 1032 09/08/18 1525  NA 132* 129*  K 5.2 4.4  CL  90* 92*  CO2 24 26  GLUCOSE 431* 395*  BUN 24 22  CREATININE 1.03* 1.09*  CALCIUM 9.4 8.6*   GFR: Estimated Creatinine Clearance: 29.6 mL/min (A) (by C-G formula based on SCr of 1.09 mg/dL (H)). Liver Function Tests: Recent Labs  Lab 09/07/18 1032  AST 7  ALT 11  ALKPHOS 94  BILITOT 0.2  PROT 6.2  ALBUMIN 3.7   Lipid Profile: Recent Labs    09/07/18 1032  CHOL 103  HDL 26*  LDLCALC 44  TRIG 164*  CHOLHDL 4.0     Radiological Exams on Admission: Dg Chest 2 View  Result Date: 09/08/2018 CLINICAL DATA:  Weakness, shortness of breath, congestion and cough EXAM: CHEST - 2 VIEW COMPARISON:  Chest x-ray of 09/02/2017 and CT chest of 08/25/2013 FINDINGS: On the frontal view there is hazy opacity peripherally in the left mid lung with prominent markings also at the left lung base. On the lateral view some of these markings appear to be the within the anterior inferior left upper lobe and lingula possibly representing pneumonia. Follow-up after interval treatment is recommended. The right lung is clear. No pleural effusion is seen. Mediastinal and hilar contours are unremarkable. The heart is within normal limits in size. No bony abnormality is seen. IMPRESSION: Opacity within the anterior inferior left upper lobe and lingula most consistent with pneumonia. Recommend follow-up to ensure clearing. Electronically Signed   By: Ivar Drape M.D.   On: 09/08/2018 16:22    EKG: Independently reviewed.  Sinus  tachycardia, significant T waves in lateral leads- ?from habitus.  Insignificant Q waves in inferior leads.  Unchanged from prior.  QTc 452  Assessment/Plan Active Problems:   Community acquired pneumonia   Community acquired pneumonia-on 7-day course of Levaquin per patient, compliant.  Last dose tomorrow.  Chest x-ray-opacity left upper lobe and lingula. Startted on IV ceftriaxone and azithromycin in ED. As symptoms- Cough, SOB appear to be resolving, despite leukocytosis 20 >> 17, lactic acidosis. -Will continue IV Levaquin 500 daily -Will hold of further imaging, doubt need for broad-spectrum antibiotics -Repeat x-ray in 4 - 6 weeks to ensure resolution -Hydrate -Follow-up blood and urine cultures -CBC BMP a.m.  Generalised weakness -likely from resolving pneumonia, dehydration with poor p.o. Intake.  With lactic acidosis, tachycardia.  -Hydrate  Lactic acidosis- 3.99 >> 2.3.  - Repeat 538m bolus, N/s 125cc?hr x 12 hrs -Follow-up cultures  Mild hyponatremia- 129, corrected for glucose 134.   Dysuria-  -UA- not suggestive of infection.  - Follow-up UA urine cultures  Oral thrush-noted within the past 5 days after starting antibiotics -Nystatin patient for swallow -HIV as part of routine health screening  COPD- Still; smokes. Not on home o2. No wheeze or rhonchi. On room air.  - Will hold off on steroids - PRN duonebs -Hold on quitting smoking  DM-glucose 395, Hgba1c 11/25- 8.7. - SSI - Hold home metformin  Breast ca hx-, s/p lumpectomy and radiation therapy 2006, and complete remission since then..Mar[place] Kitchen HTN- Stable - Cont Home norvasc, lorsatan  DVT prophylaxis: Lovenox Code Status: Full Family Communication:Spouse at bedside Disposition Plan: Per rounding team Consults called: None Admission status: Obs, tele   EBethena RoysMD [facility] Pager [phone] 3PM-11PM.  Otherwise please contact night-coverage [email or web address] Password  [email or web address]  09/08/2018, 8:20 PM

## 2018-09-08 NOTE — ED Notes (Signed)
CRITICAL RESULT  09/08/2018 1529  CG4+  Result: 3.99  MD Notifed: [employer]

## 2018-09-09 DIAGNOSIS — E0865 Diabetes mellitus due to underlying condition with hyperglycemia: Secondary | ICD-10-CM | POA: Diagnosis present

## 2018-09-09 DIAGNOSIS — E43 Unspecified severe protein-calorie malnutrition: Secondary | ICD-10-CM | POA: Diagnosis present

## 2018-09-09 DIAGNOSIS — B370 Candidal stomatitis: Secondary | ICD-10-CM | POA: Diagnosis not present

## 2018-09-09 DIAGNOSIS — J181 Lobar pneumonia, unspecified organism: Secondary | ICD-10-CM | POA: Diagnosis present

## 2018-09-09 DIAGNOSIS — A419 Sepsis, unspecified organism: Secondary | ICD-10-CM | POA: Diagnosis present

## 2018-09-09 DIAGNOSIS — J441 Chronic obstructive pulmonary disease with (acute) exacerbation: Secondary | ICD-10-CM | POA: Diagnosis not present

## 2018-09-09 DIAGNOSIS — J189 Pneumonia, unspecified organism: Secondary | ICD-10-CM | POA: Diagnosis not present

## 2018-09-09 DIAGNOSIS — Z716 Tobacco abuse counseling: Secondary | ICD-10-CM

## 2018-09-09 LAB — BASIC METABOLIC PANEL
Anion gap: 6 (ref 5–15)
BUN: 14 mg/dL (ref 8–23)
CO2: 26 mmol/L (ref 22–32)
Calcium: 7.7 mg/dL — ABNORMAL LOW (ref 8.9–10.3)
Chloride: 107 mmol/L (ref 98–111)
Creatinine, Ser: 0.72 mg/dL (ref 0.44–1.00)
GFR calc Af Amer: >60 mL/min (ref >60)
GFR calc non Af Amer: >60 mL/min (ref >60)
Glucose, Bld: 75 mg/dL (ref 70–99)
Potassium: 3.8 mmol/L (ref 3.5–5.1)
Sodium: 139 mmol/L (ref 135–145)

## 2018-09-09 LAB — CBC
HEMATOCRIT: 32.9 % — AB (ref 36.0–46.0)
HEMOGLOBIN: 10.5 g/dL — AB (ref 12.0–15.0)
MCH: 28.5 pg (ref 26.0–34.0)
MCHC: 31.9 g/dL (ref 30.0–36.0)
MCV: 89.4 fL (ref 80.0–100.0)
Platelets: 400 10*3/uL (ref 150–400)
RBC: 3.68 MIL/uL — ABNORMAL LOW (ref 3.87–5.11)
RDW: 13.4 % (ref 11.5–15.5)
WBC: 15.8 10*3/uL — AB (ref 4.0–10.5)
nRBC: 0 % (ref 0.0–0.2)

## 2018-09-09 LAB — GLUCOSE, CAPILLARY
Glucose-Capillary: 118 mg/dL — ABNORMAL HIGH (ref 70–99)
Glucose-Capillary: 279 mg/dL — ABNORMAL HIGH (ref 70–99)

## 2018-09-09 LAB — LACTIC ACID, PLASMA: LACTIC ACID, VENOUS: 1.5 mmol/L (ref 0.5–1.9)

## 2018-09-09 MED ORDER — INSULIN GLARGINE 100 UNIT/ML SOLOSTAR PEN: 15.0000 [IU] | PEN_INJECTOR | Freq: Every day | SUBCUTANEOUS | 3 refills | Status: DC

## 2018-09-09 MED ORDER — SODIUM CHLORIDE 0.9 % IV SOLN
1.0000 g | INTRAVENOUS | Status: DC
  Administered 2018-09-09: 1 g via INTRAVENOUS
  Filled 2018-09-09 (×2): qty 10
  Filled 2018-09-09: qty 1
  Filled 2018-09-09: qty 10

## 2018-09-09 MED ORDER — AMOXICILLIN-POT CLAVULANATE 875-125 MG PO TABS: 1.0000 | ORAL_TABLET | Freq: Two times a day (BID) | ORAL | 0 refills | Status: AC

## 2018-09-09 MED ORDER — PEN NEEDLES 30G X 5 MM MISC: 1.0000 "application " | Freq: Every day | 0 refills | Status: DC

## 2018-09-09 MED ORDER — NYSTATIN 100000 UNIT/ML MT SUSP: 5.0000 mL | Freq: Four times a day (QID) | OROMUCOSAL | 0 refills | Status: AC

## 2018-09-09 MED ORDER — PREDNISONE 20 MG PO TABS
40.0000 mg | ORAL_TABLET | Freq: Every day | ORAL | Status: DC
  Administered 2018-09-09: 40 mg via ORAL
  Filled 2018-09-09: qty 2

## 2018-09-09 MED ORDER — AZITHROMYCIN 250 MG PO TABS
500.0000 mg | ORAL_TABLET | Freq: Every day | ORAL | Status: DC
  Administered 2018-09-09: 500 mg via ORAL
  Filled 2018-09-09: qty 2

## 2018-09-09 MED ORDER — PREDNISONE 20 MG PO TABS: 40.0000 mg | ORAL_TABLET | Freq: Every day | ORAL | 0 refills | Status: AC

## 2018-09-09 NOTE — Discharge Summary (Signed)
Physician Discharge Summary  Jamie [name] MOQ:[id number] DOB: [birth year] DOA: 09/08/2018  PCP: Sharion Balloon, FNP  Admit date: 09/08/2018 Discharge date: 09/09/2018  Admitted From: home Disposition: home  Recommendations for Outpatient Follow-up:  1. Follow up with PCP in 1 week.  Patient will be discharged on oral Augmentin and oral prednisone for 5 days.  Please obtain follow-up chest x-ray in about 4-6 weeks to ensure resolution. 2. Patient is being started on Lantus 15 units at bedtime.  Home Health: None Equipment/Devices: None  Discharge Condition: Fair CODE STATUS: Full code Diet recommendation: Carb modified   Discharge Diagnoses:  Principal Problem:   Sepsis ([place])  Active Problems:   Lobar pneumonia ([place])   Oral thrush   Coronary artery calcification   Hyperlipidemia associated with type 2 diabetes mellitus (HCC)   Vitamin D deficiency   Protein-calorie malnutrition, severe   Diabetes mellitus due to underlying condition, uncontrolled, with hyperglycemia, without long-term current use of insulin (HCC)   COPD with acute exacerbation (HCC)   Tobacco abuse counseling  Brief narrative/HPI 67 year old female with COPD, not on home O2, CAD, diabetes mellitus type 2, hypertension and breast cancer presented with generalized weakness for past 1 week.  She was seen at an urgent care on 11/22 with symptoms of pneumonia (productive cough, shortness of breath) and prescribed 7-day course of Levaquin which she was going to complete today.  She reports that she had been compliant with her antibiotic however symptoms of cough and shortness of breath persistently worsened.  Denied any fevers reported poor p.o. intake for past 5 days associated with nausea but no abdominal pain vomiting or diarrhea. Patient went to see her PCP on the day of admission and performed blood work showing WBC of 20 K and was sent to the ED.  In the ED patient was septic with tachycardia (130),  tachypnea, WBC of 17 K and lactic acidosis of 3.99.  Chest x-ray showed left upper lobe opacity suggestive of pneumonia. Blood and urine cultures sent.  Patient given IV Rocephin and azithromycin and placed on observation under hospitalist service.  Hospital course Principal problem   sepsis ([place]) Secondary to left lobar pneumonia.  Received IV fluid on admission and subsequent lactic acid normalized.  WBC improved to 15 K this morning.  Remains afebrile.  Sepsis has resolved.  Blood culture on admission negative for growth till date.  Reports her breathing to be better and was wheezing on exam this morning.  Cough has improved as well. Patient was wheezy on exam and I think she has a component of COPD exacerbation.  Patient given DuoNeb. Will discharge her on oral Augmentin for 5-day course and oral prednisone for 5 days well (for COPD exacerbation).  Active symptoms Left lobar pneumonia (HCC) Persistent symptoms.  Plan on 5-day course of Augmentin.  Needs follow-up chest x-ray in about 4-6 weeks to ensure resolution.  COPD with acute exacerbation We will place on oral prednisone for 5-day course (avoided high-dose given hyperglycemia).  Maintaining sats on room air.  Received DuoNeb and instructed to resume home inhaler and nebulizer.  Diabetes mellitus type 2 with hyperglycemia, uncontrolled, without long-term use of insulin Patient reports previously being on insulin which was discontinued by her PCP as her A1c had improved to 6.4 a few months back and she was having episodes of hypoglycemia in the 40s-50s at home. Patient reports that she does not check her sugars frequently but occasionally in the middle of the night or early in the  morning she wakes up with some sweating with her sugars in the 50s. A1c has worsened to 8.7 this admission.  Given her lactic acidosis I will discontinue her metformin.  I will place her on Lantus 15 units at bedtime.  Also instructed to take bedtime snack to  avoid a.m. hypoglycemia.  Tobacco abuse Counseled strongly on cessation.  Smokes 1 pack/day and does not plan to quit.  Oral thrush Added nystatin.  Protein calorie malnutrition, severe Seen by dietitian and instructed on intake of high-protein snack in between meals  Essential hypertension Stable.  Continue amlodipine and losartan  Hyperlipidemia Continue statin  Hyponatremia Secondary to dehydration and sepsis.  Resolved with fluids.  Procedure: None Disposition: Home Family communication: None at bedside   Discharge Instructions   Allergies as of 09/09/2018      Reactions   Lipitor [atorvastatin] Other (See Comments)   Constipation    Soma [carisoprodol] Nausea And Vomiting      Medication List    STOP taking these medications   BD INSULIN SYRINGE U/F 31G X 5/16" 0.5 ML Misc Generic drug:  Insulin Syringe-Needle U-100   levofloxacin 500 MG tablet Commonly known as:  LEVAQUIN   metFORMIN 850 MG tablet Commonly known as:  GLUCOPHAGE     TAKE these medications   ACCU-CHEK AVIVA PLUS test strip Generic drug:  glucose blood USE AS INSTRUCTED   ACCU-CHEK AVIVA PLUS w/Device Kit 1 Device by Does not apply route 2 (two) times daily.   ACCU-CHEK FASTCLIX LANCETS Misc Use to check blood sugar two times daily  Dx Code E11.9   albuterol 108 (90 Base) MCG/ACT inhaler Commonly known as:  PROVENTIL HFA;VENTOLIN HFA Inhale 2 puffs into the lungs every 6 (six) hours as needed for wheezing or shortness of breath.   albuterol (2.5 MG/3ML) 0.083% nebulizer solution Commonly known as:  PROVENTIL Take 3 mLs (2.5 mg total) by nebulization every 6 (six) hours as needed for wheezing or shortness of breath.   amLODipine 5 MG tablet Commonly known as:  NORVASC Take 1 tablet (5 mg total) by mouth daily.   amoxicillin-clavulanate 875-125 MG tablet Commonly known as:  AUGMENTIN Take 1 tablet by mouth 2 (two) times daily for 5 days. Start taking on:  09/10/2018    Insulin Glargine 100 UNIT/ML Solostar Pen Commonly known as:  LANTUS Inject 15 Units into the skin daily.   losartan 100 MG tablet Commonly known as:  COZAAR TAKE 1 TABLET EVERY DAY What changed:  when to take this   nystatin 100000 UNIT/ML suspension Commonly known as:  MYCOSTATIN Take 5 mLs (500,000 Units total) by mouth 4 (four) times daily for 5 days.   Pen Needles 30G X 5 MM Misc 1 application by Does not apply route at bedtime.   pravastatin 20 MG tablet Commonly known as:  PRAVACHOL TAKE 1 TABLET EVERY DAY   predniSONE 20 MG tablet Commonly known as:  DELTASONE Take 2 tablets (40 mg total) by mouth daily with breakfast for 5 days. Start taking on:  09/11/2018 What changed:    medication strength  See the new instructions.  These instructions start on 09/11/2018. If you are unsure what to do until then, ask your doctor or other care provider.      Follow-up Information    Sharion Balloon, FNP. Schedule an appointment as soon as possible for a visit in 1 week(s).   Specialty:  Family Medicine Contact information: [place] [address] [phone]  Allergies  Allergen Reactions  . Lipitor [Atorvastatin] Other (See Comments)    Constipation   . Soma [Carisoprodol] Nausea And Vomiting        Procedures/Studies: Dg Chest 2 View  Result Date: 09/08/2018 CLINICAL DATA:  Weakness, shortness of breath, congestion and cough EXAM: CHEST - 2 VIEW COMPARISON:  Chest x-ray of 09/02/2017 and CT chest of 08/25/2013 FINDINGS: On the frontal view there is hazy opacity peripherally in the left mid lung with prominent markings also at the left lung base. On the lateral view some of these markings appear to be the within the anterior inferior left upper lobe and lingula possibly representing pneumonia. Follow-up after interval treatment is recommended. The right lung is clear. No pleural effusion is seen. Mediastinal and hilar contours are  unremarkable. The heart is within normal limits in size. No bony abnormality is seen. IMPRESSION: Opacity within the anterior inferior left upper lobe and lingula most consistent with pneumonia. Recommend follow-up to ensure clearing. Electronically Signed   By: Ivar Drape M.D.   On: 09/08/2018 16:22       Subjective: Reports her breathing and cough to be better.  Discharge Exam: Vitals:   09/08/18 2027 09/09/18 0539  BP: 124/68 (!) 167/89  Pulse: 86 84  Resp: 15 15  Temp: 99 F (37.2 C) 98.2 F (36.8 C)  SpO2: 97% 95%   Vitals:   09/08/18 1800 09/08/18 1900 09/08/18 2027 09/09/18 0539  BP: 123/72 139/76 124/68 (!) 167/89  Pulse:  93 86 84  Resp: (!) 22 (!) '23 15 15  ' Temp:   99 F (37.2 C) 98.2 F (36.8 C)  TempSrc:   Oral Oral  SpO2:  97% 97% 95%  Weight:   38.1 kg 38.3 kg  Height:   '5\' 2"'  (1.575 m)     General: Thin built female not in distress HEENT: Moist mucosa, supple neck, minimal oral thrush Chest: Scattered wheezing bilaterally CVs: Normal S1-S2, no murmurs GI: Soft, nondistended, nontender Musculoskeletal: Warm, no edema     The results of significant diagnostics from this hospitalization (including imaging, microbiology, ancillary and laboratory) are listed below for reference.     Microbiology: Recent Results (from the past 240 hour(s))  Culture, blood (Routine X 2) w Reflex to ID Panel     Status: None (Preliminary result)   Collection Time: 09/08/18  3:25 PM  Result Value Ref Range Status   Specimen Description BLOOD RIGHT ANTECUBITAL DRAWN BY RN  Final   Special Requests   Final    BOTTLES DRAWN AEROBIC AND ANAEROBIC Blood Culture adequate volume   Culture   Final    NO GROWTH < 24 HOURS Performed at [facility], [address] [place], [place] [address]    Report Status PENDING  Incomplete  Culture, blood (Routine X 2) w Reflex to ID Panel     Status: None (Preliminary result)   Collection Time: 09/08/18  3:33 PM  Result Value Ref Range  Status   Specimen Description BLOOD LEFT ANTECUBITAL  Final   Special Requests   Final    BOTTLES DRAWN AEROBIC AND ANAEROBIC Blood Culture results may not be optimal due to an inadequate volume of blood received in culture bottles   Culture   Final    NO GROWTH < 24 HOURS Performed at [facility], [address] [place], [place] [address]    Report Status PENDING  Incomplete     Labs: BNP (last 3 results) No results for input(s): BNP  in the last 8760 hours. Basic Metabolic Panel: Recent Labs  Lab 09/07/18 1032 09/08/18 1525 09/09/18 0217  NA 132* 129* 139  K 5.2 4.4 3.8  CL 90* 92* 107  CO2 '24 26 26  ' GLUCOSE 431* 395* 75  BUN '24 22 14  ' CREATININE 1.03* 1.09* 0.72  CALCIUM 9.4 8.6* 7.7*   Liver Function Tests: Recent Labs  Lab 09/07/18 1032  AST 7  ALT 11  ALKPHOS 94  BILITOT 0.2  PROT 6.2  ALBUMIN 3.7   No results for input(s): LIPASE, AMYLASE in the last 168 hours. No results for input(s): AMMONIA in the last 168 hours. CBC: Recent Labs  Lab 09/07/18 1032 09/08/18 1525 09/09/18 0217  WBC 20.7* 17.6* 15.8*  NEUTROABS 18.0* 14.5*  --   HGB 12.6 11.0* 10.5*  HCT 37.9 33.9* 32.9*  MCV 85 88.5 89.4  PLT 584* 430* 400   Cardiac Enzymes: No results for input(s): CKTOTAL, CKMB, CKMBINDEX, TROPONINI in the last 168 hours. BNP: Invalid input(s): POCBNP CBG: Recent Labs  Lab 09/08/18 2109 09/08/18 2308 09/09/18 0756 09/09/18 1144  GLUCAP 370* 231* 118* 279*   D-Dimer No results for input(s): DDIMER in the last 72 hours. Hgb A1c No results for input(s): HGBA1C in the last 72 hours. Lipid Profile Recent Labs    09/07/18 1032  CHOL 103  HDL 26*  LDLCALC 44  TRIG 164*  CHOLHDL 4.0   Thyroid function studies No results for input(s): TSH, T4TOTAL, T3FREE, THYROIDAB in the last 72 hours.  Invalid input(s): FREET3 Anemia work up No results for input(s): VITAMINB12, FOLATE, FERRITIN, TIBC, IRON, RETICCTPCT in the last 72 hours. Urinalysis     Component Value Date/Time   COLORURINE YELLOW 09/08/2018 2022   APPEARANCEUR CLEAR 09/08/2018 2022   APPEARANCEUR Clear 01/29/2018 1558   LABSPEC 1.021 09/08/2018 2022   PHURINE 5.0 09/08/2018 2022   GLUCOSEU >=500 (A) 09/08/2018 2022   HGBUR NEGATIVE 09/08/2018 2022   BILIRUBINUR NEGATIVE 09/08/2018 2022   BILIRUBINUR Negative 01/29/2018 1558   KETONESUR NEGATIVE 09/08/2018 2022   PROTEINUR NEGATIVE 09/08/2018 2022   UROBILINOGEN negative 01/11/2015 1442   UROBILINOGEN 0.2 12/31/2014 0409   NITRITE NEGATIVE 09/08/2018 2022   LEUKOCYTESUR NEGATIVE 09/08/2018 2022   LEUKOCYTESUR Negative 01/29/2018 1558   Sepsis Labs Invalid input(s): PROCALCITONIN,  WBC,  LACTICIDVEN Microbiology Recent Results (from the past 240 hour(s))  Culture, blood (Routine X 2) w Reflex to ID Panel     Status: None (Preliminary result)   Collection Time: 09/08/18  3:25 PM  Result Value Ref Range Status   Specimen Description BLOOD RIGHT ANTECUBITAL DRAWN BY RN  Final   Special Requests   Final    BOTTLES DRAWN AEROBIC AND ANAEROBIC Blood Culture adequate volume   Culture   Final    NO GROWTH < 24 HOURS Performed at [facility], [address], [place], [place] [address]    Report Status PENDING  Incomplete  Culture, blood (Routine X 2) w Reflex to ID Panel     Status: None (Preliminary result)   Collection Time: 09/08/18  3:33 PM  Result Value Ref Range Status   Specimen Description BLOOD LEFT ANTECUBITAL  Final   Special Requests   Final    BOTTLES DRAWN AEROBIC AND ANAEROBIC Blood Culture results may not be optimal due to an inadequate volume of blood received in culture bottles   Culture   Final    NO GROWTH < 24 HOURS Performed at [facility], [address],  [place], [name] [address]    Report Status PENDING  Incomplete     Time coordinating discharge: <30  minutes  SIGNED:   Louellen Molder, MD  [facility] 09/09/2018, 1:41 PM Pager   If 7PM-7AM, please contact  night-coverage [email or web address] Password [email or web address]

## 2018-09-09 NOTE — Progress Notes (Signed)
Inpatient Diabetes Program Recommendations  AACE/ADA: New Consensus Statement on Inpatient Glycemic Control (2015)  Target Ranges:  Prepandial:   less than 140 mg/dL      Peak postprandial:   less than 180 mg/dL (1-2 hours)      Critically ill patients:  140 - 180 mg/dL   Results for Jamie [name], Jamie [name] (MRN [id number]) as of 09/09/2018 13:55  Ref. Range 09/08/2018 21:09 09/08/2018 23:08 09/09/2018 07:56 09/09/2018 11:44  Glucose-Capillary Latest Ref Range: 70 - 99 mg/dL 370 (H)  5 units NOVOLOG  231 (H) 118 (H) 279 (H)  5 units NOVOLOG       Home DM Meds: Metformin 850 mg BID  Current Orders: Novolog Sensitive Correction Scale/ SSI (0-9 units) TID AC     Patient getting Prednisone 40 mg Daily.  Having elevated post-meal glucose excursions.  Home Metformin on hold at present.     MD- Please consider starting low dose Novolog Meal Coverage:  Novolog 3 units TID with meals  (Please add the following Hold Parameters: Hold if pt eats <50% of meal, Hold if pt NPO)     --Will follow patient during hospitalization--  Wyn Quaker RN, MSN, CDE Diabetes Coordinator Inpatient Glycemic Control Team Team Pager: [phone] (8a-5p)

## 2018-09-09 NOTE — Progress Notes (Signed)
Initial Nutrition Assessment  DOCUMENTATION CODES:   Severe malnutrition in context of acute illness/injury  INTERVENTION:  Heart Healthy CHO modified diet   High Protein/High Calorie snacks BID  MVI daily  NUTRITION DIAGNOSIS:   Severe Malnutrition related to chronic illness, acute illness, decreased appetite(Acute PNA on Chronic COPD- poor po intake 5 days prior to admission. ) as evidenced by per patient/family report, percent weight loss, energy intake < or equal to 50% for > or equal to 5 days, moderate fat depletion, moderate muscle depletion(-dehydrated on admission).   GOAL: Po intake, labs and wt trends    MONITOR:   PO intake, Weight trends, Labs   REASON FOR ASSESSMENT:   Consult Assessment of nutrition requirement/status  ASSESSMENT: Patient is a 67 yo female with hx of Diabetes, HTN, HLD, COPD, and hearing loss and breast cancer. She presents with diagnosis of PNA 11/20, complaining of generalized weakness and poor appetite.  Patient consumes a regular diet at home. Husband says she was eating normally up until 5 days prior to admission. Husband works during the day and patient is able to prepare evening meal when she is well. She drinks Diet Dr Malachi Bonds and likes peanut butter crackers. This morning she feels her appetite has returned and says she ate most of breakfast this morning. Feeds herself.    Weight has been ranging between 41-44 kg. Patient husband reports 10 lb loss from October (92 lbs) visit to Dr Luan Pulling to next f/u appointment in November (81 lb) a 12% loss in 1 month. Her current weight reflects a undesirable, severe loss of 13% compared to 6 months ago.  Physical exam noted below. Patient is acutely malnourished. She is being discharged home today with husband. Recommend she consume 3 meals and 2 high protein/ high calorie snacks daily and MVI daily. She has hx of vitamin D deficiency as well.     Medications reviewed and include: Zithromax, rocephin,   SSI, Prednisone.   Labs: BMP Latest Ref Rng & Units 09/09/2018 09/08/2018 09/07/2018  Glucose 70 - 99 mg/dL 75 395(H) 431(HH)  BUN 8 - 23 mg/dL 14 22 24   Creatinine 0.44 - 1.00 mg/dL 0.72 1.09(H) 1.03(H)  BUN/Creat Ratio 12 - 28 - - 23  Sodium 135 - 145 mmol/L 139 129(L) 132(L)  Potassium 3.5 - 5.1 mmol/L 3.8 4.4 5.2  Chloride 98 - 111 mmol/L 107 92(L) 90(L)  CO2 22 - 32 mmol/L 26 26 24   Calcium 8.9 - 10.3 mg/dL 7.7(L) 8.6(L) 9.4     NUTRITION - FOCUSED PHYSICAL EXAM:    Most Recent Value  Orbital Region  Severe depletion  Upper Arm Region  Severe depletion  Thoracic and Lumbar Region  Moderate depletion  Buccal Region  Severe depletion  Temple Region  Moderate depletion  Clavicle Bone Region  Moderate depletion  Clavicle and Acromion Bone Region  Severe depletion  Scapular Bone Region  Moderate depletion  Patellar Region  Severe depletion  Anterior Thigh Region  Severe depletion  Edema (RD Assessment)  None  Hair  Reviewed  Mouth  Reviewed  Nails  Reviewed     Diet Order:   Diet Order            Diet heart healthy/carb modified Room service appropriate? Yes; Fluid consistency: Thin  Diet effective now              EDUCATION NEEDS:   Education needs have been addressed Skin:  Skin Assessment: Reviewed RN Assessment  Last BM:  11/26  Height:   Ht Readings from Last 1 Encounters:  09/08/18 5\' 2"  (1.575 m)    Weight:   Wt Readings from Last 1 Encounters:  09/09/18 38.3 kg    Ideal Body Weight:  50 kg  BMI:  Body mass index is 15.44 kg/m.  Estimated Nutritional Needs:   Kcal:  1340-1455 (35-38 kcal/kg/bw)  Protein:  57-68 (1.5-1.8 gr/kg/bw)  Fluid:  >1100 ml daily  Colman Cater MS,RD,CSG,LDN Office: [phone] Pager: [phone]

## 2018-09-09 NOTE — Progress Notes (Signed)
Patient has urinated several times throughout night, continues to miss hat in toilet.

## 2018-09-09 NOTE — Discharge Instructions (Signed)

## 2018-09-09 NOTE — Progress Notes (Signed)
Removed IV-clean, dry, intact. Reviewed d/c paperwork with patient and husband. Reviewed new prescriptions and medication changes. Gave preprinted prescriptions. Answered all questions. Wheeled stable patient and belongings to main entrance where she was picked up by husband.

## 2018-09-10 LAB — HIV ANTIBODY (ROUTINE TESTING W REFLEX): HIV SCREEN 4TH GENERATION: NONREACTIVE

## 2018-09-11 ENCOUNTER — Encounter: Payer: Self-pay | Admitting: Family

## 2018-09-11 ENCOUNTER — Ambulatory Visit (INDEPENDENT_AMBULATORY_CARE_PROVIDER_SITE_OTHER): Payer: Medicare HMO | Admitting: Family

## 2018-09-11 VITALS — BP 151/79 | HR 86 | Temp 98.0°F | Ht 61.0 in | Wt 87.0 lb

## 2018-09-11 DIAGNOSIS — J181 Lobar pneumonia, unspecified organism: Secondary | ICD-10-CM

## 2018-09-11 DIAGNOSIS — E118 Type 2 diabetes mellitus with unspecified complications: Secondary | ICD-10-CM

## 2018-09-11 DIAGNOSIS — J449 Chronic obstructive pulmonary disease, unspecified: Secondary | ICD-10-CM | POA: Diagnosis not present

## 2018-09-11 DIAGNOSIS — J441 Chronic obstructive pulmonary disease with (acute) exacerbation: Secondary | ICD-10-CM | POA: Diagnosis not present

## 2018-09-11 MED ORDER — INSULIN GLARGINE 100 UNIT/ML SOLOSTAR PEN: 15.0000 [IU] | PEN_INJECTOR | Freq: Every day | SUBCUTANEOUS | 3 refills | Status: DC

## 2018-09-11 NOTE — Progress Notes (Signed)
   Subjective:    Patient ID: Jamie [name], female    DOB: 1951/07/29, 67 y.o.   MRN: [id number]  Chief Complaint  Patient presents with  . Hospitalization Follow-up   PT presents to the office for hospital follow up. She had lobar pneumonia and was seen on 09/07/18 and had a WBC of 20.7. She was sent to the ED and treated for sepses. She was given IV rocephin and azithromycin. She was discharged on 09/09/18.   She has completed Levaquin and was discharge on Augmentin and prednisone.    Her blood sugars have been elevated and was started on lantus 15 units at bedtime. She states she threw this rx away and started her short acing  Insulin because she had this at home.Her metformin was stopped because of her lactic acidosis.  Diabetes  She presents for her follow-up diabetic visit. She has type 2 diabetes mellitus. Her disease course has been stable. There are no hypoglycemic associated symptoms. Pertinent negatives for diabetes include no foot paresthesias and no visual change. Hypoglycemia complications include hospitalization. Symptoms are stable.   [facility] notes reviewed.    Review of Systems  All other systems reviewed and are negative.      Objective:   Physical Exam  Constitutional: She is oriented to person, place, and time. She appears well-developed and well-nourished. No distress.  HENT:  Head: Normocephalic and atraumatic.  Right Ear: External ear normal.  Left Ear: External ear normal.  Mouth/Throat: Oropharynx is clear and moist.  Eyes: Pupils are equal, round, and reactive to light.  Neck: Normal range of motion. Neck supple. No thyromegaly present.  Cardiovascular: Normal rate, regular rhythm, normal heart sounds and intact distal pulses.  No murmur heard. Pulmonary/Chest: Effort normal. No respiratory distress. She has wheezes. She has rhonchi.  Abdominal: Soft. Bowel sounds are normal. She exhibits no distension. There is no tenderness.  Musculoskeletal: Normal  range of motion. She exhibits no edema or tenderness.  Neurological: She is alert and oriented to person, place, and time. She has normal reflexes. No cranial nerve deficit.  Skin: Skin is warm and dry.  Psychiatric: She has a normal mood and affect. Her behavior is normal. Judgment and thought content normal.  Vitals reviewed.     BP (!) 151/79   Pulse 86   Temp 98 F (36.7 C) (Oral)   Ht 5' 1" (1.549 m)   Wt 87 lb (39.5 kg)   BMI 16.44 kg/m      Assessment & Plan:  [name] comes in today with chief complaint of Hospitalization Follow-up   Diagnosis and orders addressed:  1. Moderate COPD (chronic obstructive pulmonary disease) (HCC) - CMP14+EGFR - CBC with Differential/Platelet  2. Lobar pneumonia ([place]) - CMP14+EGFR - CBC with Differential/Platelet  3. COPD with acute exacerbation (HCC) - CMP14+EGFR - CBC with Differential/Platelet  4. Diabetes mellitus with complication ([place]) - She will start her lantus and stop her short acting insulin and only use if glucose is greater than 250. Stop metformin Low strict carb - Insulin Glargine (LANTUS SOLOSTAR) 100 UNIT/ML Solostar Pen; Inject 15 Units into the skin daily.  Dispense: 15 mL; Refill: 3 - CMP14+EGFR - CBC with Differential/Platelet   Complete Augmentin and prednisone RTO in 4 weeks or sooner if symptoms worsen or do not improve    Evelina Dun, FNP

## 2018-09-11 NOTE — Patient Instructions (Signed)

## 2018-09-12 LAB — CBC WITH DIFFERENTIAL/PLATELET
BASOS ABS: 0.1 10*3/uL (ref 0.0–0.2)
BASOS: 1 %
EOS (ABSOLUTE): 0 10*3/uL (ref 0.0–0.4)
Eos: 0 %
Hematocrit: 32.8 % — ABNORMAL LOW (ref 34.0–46.6)
Hemoglobin: 11.4 g/dL (ref 11.1–15.9)
Immature Grans (Abs): 0.6 10*3/uL — ABNORMAL HIGH (ref 0.0–0.1)
Immature Granulocytes: 3 %
LYMPHS ABS: 0.9 10*3/uL (ref 0.7–3.1)
Lymphs: 5 %
MCH: 29 pg (ref 26.6–33.0)
MCHC: 34.8 g/dL (ref 31.5–35.7)
MCV: 84 fL (ref 79–97)
MONOS ABS: 0.4 10*3/uL (ref 0.1–0.9)
Monocytes: 2 %
NEUTROS ABS: 16.4 10*3/uL — AB (ref 1.4–7.0)
Neutrophils: 89 %
PLATELETS: 472 10*3/uL — AB (ref 150–450)
RBC: 3.93 x10E6/uL (ref 3.77–5.28)
RDW: 12.9 % (ref 12.3–15.4)
WBC: 18.4 10*3/uL — ABNORMAL HIGH (ref 3.4–10.8)

## 2018-09-12 LAB — BLOOD CULTURE ID PANEL (REFLEXED)
Acinetobacter baumannii: NOT DETECTED
CANDIDA KRUSEI: NOT DETECTED
Candida albicans: NOT DETECTED
Candida glabrata: NOT DETECTED
Candida parapsilosis: NOT DETECTED
Candida tropicalis: NOT DETECTED
ENTEROCOCCUS SPECIES: NOT DETECTED
Enterobacter cloacae complex: NOT DETECTED
Enterobacteriaceae species: NOT DETECTED
Escherichia coli: NOT DETECTED
HAEMOPHILUS INFLUENZAE: NOT DETECTED
Klebsiella oxytoca: NOT DETECTED
Klebsiella pneumoniae: NOT DETECTED
Listeria monocytogenes: NOT DETECTED
Neisseria meningitidis: NOT DETECTED
PROTEUS SPECIES: NOT DETECTED
PSEUDOMONAS AERUGINOSA: NOT DETECTED
SERRATIA MARCESCENS: NOT DETECTED
STAPHYLOCOCCUS AUREUS BCID: NOT DETECTED
STAPHYLOCOCCUS SPECIES: NOT DETECTED
STREPTOCOCCUS PNEUMONIAE: NOT DETECTED
STREPTOCOCCUS SPECIES: NOT DETECTED
Streptococcus agalactiae: NOT DETECTED
Streptococcus pyogenes: NOT DETECTED

## 2018-09-12 LAB — CMP14+EGFR
ALT: 16 IU/L (ref 0–32)
AST: 18 IU/L (ref 0–40)
Albumin/Globulin Ratio: 1.7 (ref 1.2–2.2)
Albumin: 3.6 g/dL (ref 3.6–4.8)
Alkaline Phosphatase: 70 IU/L (ref 39–117)
BUN/Creatinine Ratio: 18 (ref 12–28)
BUN: 14 mg/dL (ref 8–27)
CALCIUM: 8.9 mg/dL (ref 8.7–10.3)
CHLORIDE: 95 mmol/L — AB (ref 96–106)
CO2: 22 mmol/L (ref 20–29)
Creatinine, Ser: 0.77 mg/dL (ref 0.57–1.00)
GFR, EST AFRICAN AMERICAN: 93 mL/min/{1.73_m2} (ref >59)
GFR, EST NON AFRICAN AMERICAN: 81 mL/min/{1.73_m2} (ref >59)
GLOBULIN, TOTAL: 2.1 g/dL (ref 1.5–4.5)
GLUCOSE: 208 mg/dL — AB (ref 65–99)
POTASSIUM: 4.3 mmol/L (ref 3.5–5.2)
SODIUM: 134 mmol/L (ref 134–144)
TOTAL PROTEIN: 5.7 g/dL — AB (ref 6.0–8.5)

## 2018-09-12 NOTE — Progress Notes (Signed)
CRITICAL VALUE ALERT  Critical Value:  Positive Blood culture-Gram + Cocci in Anaerobic bottle  Date & Time Notied:  09/12/18 0115  Provider Notified: Dr. Myna Hidalgo  Orders Received/Actions taken: MD to review chart

## 2018-09-13 LAB — CULTURE, BLOOD (ROUTINE X 2)
CULTURE: NO GROWTH
SPECIAL REQUESTS: ADEQUATE

## 2018-09-14 ENCOUNTER — Telehealth: Payer: Self-pay | Admitting: Family Medicine

## 2018-09-14 LAB — CULTURE, BLOOD (ROUTINE X 2)

## 2018-09-14 NOTE — Telephone Encounter (Signed)
Pt advised her Lantus was sent to [facility] at her visit 09/11/18. Pt voiced understanding.

## 2018-09-16 ENCOUNTER — Ambulatory Visit: Payer: Medicare HMO | Admitting: *Deleted

## 2018-09-16 ENCOUNTER — Encounter: Payer: Self-pay | Admitting: *Deleted

## 2018-09-16 VITALS — BP 153/78 | HR 90 | Ht 62.0 in | Wt 86.4 lb

## 2018-09-16 DIAGNOSIS — Z0000 Encounter for general adult medical examination without abnormal findings: Secondary | ICD-10-CM

## 2018-09-16 DIAGNOSIS — Z23 Encounter for immunization: Secondary | ICD-10-CM

## 2018-09-16 NOTE — Patient Instructions (Addendum)
Please work on your goal of continuing to have 3 balanced meals per day.   Please review the information given on Advance Directives.  If you complete the paperwork please bring a copy to our office to be filed in your medical record.   You received your first Shingrix (Shingles vaccine) today, you will need the 2nd dose after 11/17/18.   Thank you for coming in for your Annual Wellness Visit today!!   Preventive Care 66 Years and Older, Female Preventive care refers to lifestyle choices and visits with your health care provider that can promote health and wellness. What does preventive care include?  A yearly physical exam. This is also called an annual well check.  Dental exams once or twice a year.  Routine eye exams. Ask your health care provider how often you should have your eyes checked.  Personal lifestyle choices, including: ? Daily care of your teeth and gums. ? Regular physical activity. ? Eating a healthy diet. ? Avoiding tobacco and drug use. ? Limiting alcohol use. ? Practicing safe sex. ? Taking low-dose aspirin every day. ? Taking vitamin and mineral supplements as recommended by your health care provider. What happens during an annual well check? The services and screenings done by your health care provider during your annual well check will depend on your age, overall health, lifestyle risk factors, and family history of disease. Counseling Your health care provider may ask you questions about your:  Alcohol use.  Tobacco use.  Drug use.  Emotional well-being.  Home and relationship well-being.  Sexual activity.  Eating habits.  History of falls.  Memory and ability to understand (cognition).  Work and work Statistician.  Reproductive health.  Screening You may have the following tests or measurements:  Height, weight, and BMI.  Blood pressure.  Lipid and cholesterol levels. These may be checked every 5 years, or more frequently if you are  over 66 years old.  Skin check.  Lung cancer screening. You may have this screening every year starting at age 36 if you have a 30-pack-year history of smoking and currently smoke or have quit within the past 15 years.  Fecal occult blood test (FOBT) of the stool. You may have this test every year starting at age 62.  Flexible sigmoidoscopy or colonoscopy. You may have a sigmoidoscopy every 5 years or a colonoscopy every 10 years starting at age 32.  Hepatitis C blood test.  Hepatitis B blood test.  Sexually transmitted disease (STD) testing.  Diabetes screening. This is done by checking your blood sugar (glucose) after you have not eaten for a while (fasting). You may have this done every 1-3 years.  Bone density scan. This is done to screen for osteoporosis. You may have this done starting at age 51.  Mammogram. This may be done every 1-2 years. Talk to your health care provider about how often you should have regular mammograms.  Talk with your health care provider about your test results, treatment options, and if necessary, the need for more tests. Vaccines Your health care provider may recommend certain vaccines, such as:  Influenza vaccine. This is recommended every year.  Tetanus, diphtheria, and acellular pertussis (Tdap, Td) vaccine. You may need a Td booster every 10 years.  Varicella vaccine. You may need this if you have not been vaccinated.  Zoster vaccine. You may need this after age 59.  Measles, mumps, and rubella (MMR) vaccine. You may need at least one dose of MMR if you were  born in 1 or later. You may also need a second dose.  Pneumococcal 13-valent conjugate (PCV13) vaccine. One dose is recommended after age 28.  Pneumococcal polysaccharide (PPSV23) vaccine. One dose is recommended after age 12.  Meningococcal vaccine. You may need this if you have certain conditions.  Hepatitis A vaccine. You may need this if you have certain conditions or if you  travel or work in places where you may be exposed to hepatitis A.  Hepatitis B vaccine. You may need this if you have certain conditions or if you travel or work in places where you may be exposed to hepatitis B.  Haemophilus influenzae type b (Hib) vaccine. You may need this if you have certain conditions.  Talk to your health care provider about which screenings and vaccines you need and how often you need them. This information is not intended to replace advice given to you by your health care provider. Make sure you discuss any questions you have with your health care provider. Document Released: 10/27/2015 Document Revised: 06/19/2016 Document Reviewed: 08/01/2015 Elsevier Interactive Patient Education  2018 [place] in the Home Falls can cause injuries. They can happen to people of all ages. There are many things you can do to make your home safe and to help prevent falls. What can I do on the outside of my home?  Regularly fix the edges of walkways and driveways and fix any cracks.  Remove anything that might make you trip as you walk through a door, such as a raised step or threshold.  Trim any bushes or trees on the path to your home.  Use bright outdoor lighting.  Clear any walking paths of anything that might make someone trip, such as rocks or tools.  Regularly check to see if handrails are loose or broken. Make sure that both sides of any steps have handrails.  Any raised decks and porches should have guardrails on the edges.  Have any leaves, snow, or ice cleared regularly.  Use sand or salt on walking paths during winter.  Clean up any spills in your garage right away. This includes oil or grease spills. What can I do in the bathroom?  Use night lights.  Install grab bars by the toilet and in the tub and shower. Do not use towel bars as grab bars.  Use non-skid mats or decals in the tub or shower.  If you need to sit down in the  shower, use a plastic, non-slip stool.  Keep the floor dry. Clean up any water that spills on the floor as soon as it happens.  Remove soap buildup in the tub or shower regularly.  Attach bath mats securely with double-sided non-slip rug tape.  Do not have throw rugs and other things on the floor that can make you trip. What can I do in the bedroom?  Use night lights.  Make sure that you have a light by your bed that is easy to reach.  Do not use any sheets or blankets that are too big for your bed. They should not hang down onto the floor.  Have a firm chair that has side arms. You can use this for support while you get dressed.  Do not have throw rugs and other things on the floor that can make you trip. What can I do in the kitchen?  Clean up any spills right away.  Avoid walking on wet floors.  Keep items that you use a  lot in easy-to-reach places.  If you need to reach something above you, use a strong step stool that has a grab bar.  Keep electrical cords out of the way.  Do not use floor polish or wax that makes floors slippery. If you must use wax, use non-skid floor wax.  Do not have throw rugs and other things on the floor that can make you trip. What can I do with my stairs?  Do not leave any items on the stairs.  Make sure that there are handrails on both sides of the stairs and use them. Fix handrails that are broken or loose. Make sure that handrails are as long as the stairways.  Check any carpeting to make sure that it is firmly attached to the stairs. Fix any carpet that is loose or worn.  Avoid having throw rugs at the top or bottom of the stairs. If you do have throw rugs, attach them to the floor with carpet tape.  Make sure that you have a light switch at the top of the stairs and the bottom of the stairs. If you do not have them, ask someone to add them for you. What else can I do to help prevent falls?  Wear shoes that: ? Do not have high  heels. ? Have rubber bottoms. ? Are comfortable and fit you well. ? Are closed at the toe. Do not wear sandals.  If you use a stepladder: ? Make sure that it is fully opened. Do not climb a closed stepladder. ? Make sure that both sides of the stepladder are locked into place. ? Ask someone to hold it for you, if possible.  Clearly mark and make sure that you can see: ? Any grab bars or handrails. ? First and last steps. ? Where the edge of each step is.  Use tools that help you move around (mobility aids) if they are needed. These include: ? Canes. ? Walkers. ? Scooters. ? Crutches.  Turn on the lights when you go into a dark area. Replace any light bulbs as soon as they burn out.  Set up your furniture so you have a clear path. Avoid moving your furniture around.  If any of your floors are uneven, fix them.  If there are any pets around you, be aware of where they are.  Review your medicines with your doctor. Some medicines can make you feel dizzy. This can increase your chance of falling. Ask your doctor what other things that you can do to help prevent falls. This information is not intended to replace advice given to you by your health care provider. Make sure you discuss any questions you have with your health care provider. Document Released: 07/27/2009 Document Revised: 03/07/2016 Document Reviewed: 11/04/2014 Elsevier Interactive Patient Education  [employer].

## 2018-09-16 NOTE — Progress Notes (Signed)
Subjective:   Jamie [name] is a 67 y.o. female who presents for an Initial Medicare Annual Wellness Visit.  Jamie [name] is accompanied today by her husband.  She worked at [employer] until she went out of work on disability in 2009 due to back problems.  She enjoys playing games on her tablet and working around her home.  She lives with her husband Jamie [name], and they have 1 adult son.  She also has a dog.  She feels her health is unchanged from last year in general.  She recently went to the ER and was hospitalized for pneumonia, but states she is feeling much better now.  She reports no surgeries in the past year.  Review of Systems     Musculoskeletal - lower back pain       Objective:    Today's Vitals   09/16/18 1412  BP: (!) 153/78  Pulse: 90  Weight: 86 lb 6.4 oz (39.2 kg)  Height: '5\' 2"'$  (1.575 m)  PainSc: 10-Worst pain ever  PainLoc: Back   Body mass index is 15.8 kg/m.  Advanced Directives 09/16/2018 09/09/2018 09/08/2018 02/03/2017 01/28/2017 06/05/2015 12/31/2014  Does Patient Have a Medical Advance Directive? No Yes Yes No No Yes No  Type of Advance Directive - Living will Living will - - [place] -  Does patient want to make changes to medical advance directive? Yes (MAU/Ambulatory/Procedural Areas - Information given) No - Patient declined - - - No - Patient declined -  Copy of [place] in Chart? - - - - - No - copy requested -  Would patient like information on creating a medical advance directive? Yes (MAU/Ambulatory/Procedural Areas - Information given) - - - No - Patient declined - -    Current Medications (verified) Outpatient Encounter Medications as of 09/16/2018  Medication Sig  . ACCU-CHEK AVIVA PLUS test strip USE AS INSTRUCTED  . ACCU-CHEK FASTCLIX LANCETS MISC Use to check blood sugar two times daily  Dx Code E11.9  . albuterol (PROVENTIL HFA;VENTOLIN HFA) 108 (90 BASE) MCG/ACT inhaler Inhale 2 puffs into the lungs every 6 (six)  hours as needed for wheezing or shortness of breath.  Marland Kitchen albuterol (PROVENTIL) (2.5 MG/3ML) 0.083% nebulizer solution Take 3 mLs (2.5 mg total) by nebulization every 6 (six) hours as needed for wheezing or shortness of breath.  Marland Kitchen amLODipine (NORVASC) 5 MG tablet Take 1 tablet (5 mg total) by mouth daily.  . Blood Glucose Monitoring Suppl (ACCU-CHEK AVIVA PLUS) W/DEVICE KIT 1 Device by Does not apply route 2 (two) times daily.  . Insulin Glargine (LANTUS SOLOSTAR) 100 UNIT/ML Solostar Pen Inject 15 Units into the skin daily.  . Insulin Pen Needle (PEN NEEDLES) 30G X 5 MM MISC 1 application by Does not apply route at bedtime.  Marland Kitchen losartan (COZAAR) 100 MG tablet TAKE 1 TABLET EVERY DAY (Patient taking differently: Take 100 mg by mouth every evening. )  . pravastatin (PRAVACHOL) 20 MG tablet TAKE 1 TABLET EVERY DAY (Patient taking differently: Take 20 mg by mouth daily. )  . predniSONE (DELTASONE) 20 MG tablet Take 2 tablets (40 mg total) by mouth daily with breakfast for 5 days.   No facility-administered encounter medications on file as of 09/16/2018.     Allergies (verified) Lipitor [atorvastatin] and Soma [carisoprodol]   History: Past Medical History:  Diagnosis Date  . Breast cancer ([place])   . COPD (chronic obstructive pulmonary disease) ([place])   . Diabetes ([place])   .  HTN (hypertension)   . Hyperlipidemia   . Personal history of radiation therapy   . Vitamin D deficiency    Past Surgical History:  Procedure Laterality Date  . BREAST SURGERY    . CARPAL TUNNEL RELEASE    . CERVICAL SPINE SURGERY    . EYE SURGERY Bilateral    cataracts  . GALLBLADDER SURGERY    . INNER EAR SURGERY    . SPINE SURGERY     Family History  Problem Relation Age of Onset  . Heart attack Mother   . Stroke Mother   . Stroke Sister   . Diabetes Unknown        sister x3, and mother  . Lung disease Father        worked in [employer]  . Colon cancer Neg Hx   . Colon polyps Neg Hx    Social History     Socioeconomic History  . Marital status: Married    Spouse name: Not on file  . Number of children: 1  . Years of education: Not on file  . Highest education level: High school graduate  Occupational History  . Occupation: disability  Social Needs  . Financial resource strain: Not very hard  . Food insecurity:    Worry: Never true    Inability: Never true  . Transportation needs:    Medical: No    Non-medical: No  Tobacco Use  . Smoking status: Current Every Day Smoker    Packs/day: 1.00    Years: 51.00    Pack years: 51.00    Types: Cigarettes  . Smokeless tobacco: Never Used  Substance and Sexual Activity  . Alcohol use: No  . Drug use: No  . Sexual activity: Not on file  Lifestyle  . Physical activity:    Days per week: 0 days    Minutes per session: 0 min  . Stress: Not at all  Relationships  . Social connections:    Talks on phone: More than three times a week    Gets together: More than three times a week    Attends religious service: Never    Active member of club or organization: No    Attends meetings of clubs or organizations: Never    Relationship status: Married  Other Topics Concern  . Not on file  Social History Narrative  . Not on file    Tobacco Counseling Ready to quit: No Counseling given: Yes   Clinical Intake:     Pain Score: 10-Worst pain ever                  Activities of Daily Living In your present state of health, do you have any difficulty performing the following activities: 09/16/2018 09/09/2018  Hearing? N Y  Comment deaf in left ear, 50% hearing in right ear- has seen audiolgy - declines hearing aids due to price  -  Vision? N N  Difficulty concentrating or making decisions? N N  Walking or climbing stairs? Y N  Comment due to back pain -  Dressing or bathing? N N  Doing errands, shopping? Y N  Comment Husband provides transporation for errands  -  Conservation officer, nature and eating ? N -  Using the Toilet? N -   In the past six months, have you accidently leaked urine? N -  Do you have problems with loss of bowel control? N -  Managing your Medications? N -  Managing your Finances? N -  Housekeeping or managing your Housekeeping? N -  Some recent data might be hidden     Immunizations and Health Maintenance Immunization History  Administered Date(s) Administered  . Influenza Split 08/14/2012, 07/17/2013  . Influenza, High Dose Seasonal PF 07/28/2018  . Influenza,inj,Quad PF,6+ Mos 07/11/2014, 07/14/2015, 07/15/2016, 07/22/2017  . Pneumococcal Conjugate-13 02/13/2015  . Pneumococcal Polysaccharide-23 10/14/2002, 01/29/2018   Shingrix given - 09/16/18  Health Maintenance Due  Topic Date Due  . TETANUS/TDAP  08/11/1971  Tdap declined today   Patient Care Team: Sharion Balloon, FNP as PCP - General (Nurse Practitioner)      Assessment:   This is a routine wellness examination for Maynard.  Hearing/Vision screen Patient is deaf in left ear, and has 50% hearing in right ear. Patient states she has no vision deficits, has had bilateral cataracts removed.  Dietary issues and exercise activities discussed:    Patient does not participate in regular exercise.   Patient states her appetite is greatly improved.  She has gained 5 lbs since her last visit.  She usually has an egg biscuit or cereal for breakfast, sandwich or soup for lunch, and meat and 2 vegetables for supper.  Encouraged her to continue eating 3 balanced meals per day.  Healthy cooking methods discussed.  Patient states she has access and resources to get all the food she needs.  Goals    . Have 3 meals a day     Continue to have 3 balanced meals per day.       Depression Screen PHQ 2/9 Scores 09/16/2018 09/11/2018 09/07/2018 05/05/2018 04/13/2018 03/17/2018 03/10/2018  PHQ - 2 Score _0 PHQ- 9 Score - 19 21 - _1 Fall Risk Fall Risk  09/16/2018 09/11/2018 09/07/2018 05/05/2018 04/13/2018  Falls in the  past year? 1 0 0 Yes Yes  Number falls in past yr: 0 - - 1 1  Injury with Fall? 0 - - No No  Risk for fall due to : Impaired balance/gait - - - -    Is the patient's home free of loose throw rugs in walkways, pet beds, electrical cords, etc?   no, encouraged her to remove any throw rugs      Grab bars in the bathroom? no      Handrails on the stairs?   no      Adequate lighting?   yes    Cognitive Function:     6CIT Screen 09/16/2018  What Year? 0 points  What month? 0 points  What time? 0 points  Count back from 20 0 points  Months in reverse 2 points  Repeat phrase 0 points  Total Score 2    Screening Tests Health Maintenance  Topic Date Due  . TETANUS/TDAP  08/11/1971  . OPHTHALMOLOGY EXAM  01/23/2019  . FOOT EXAM  01/30/2019  . HEMOGLOBIN A1C  03/08/2019  . MAMMOGRAM  02/05/2020  . Fecal DNA (Cologuard)  03/04/2021  . INFLUENZA VACCINE  Completed  . DEXA SCAN  Completed  . Hepatitis C Screening  Completed  . PNA vac Low Risk Adult  Completed   tdap declined today   Qualifies for Shingles Vaccine? Yes, 1st dose given today  Cancer Screenings: Lung: Low Dose CT Chest recommended if Age 23-80 years, 30 pack-year currently smoking OR have quit w/in 15years. Patient does qualify. Breast: Up to date on Mammogram? Yes   Up to date of Bone Density/Dexa? Yes Colorectal: up  to date  Additional Screenings:  Hepatitis C Screening:  Completed 10/10/2015     Plan:     Work on your goal of continuing to have 3 balanced meals per day.  Review the information given on Advance Directives.  If you complete the paperwork please bring a copy to our office to be filed in your medical record.  Get 2nd Shingrix vaccine after 11/17/18.  Follow up with Evelina Dun, FNP as directed   I have personally reviewed and noted the following in the patient's chart:   . Medical and social history . Use of alcohol, tobacco or illicit drugs  . Current medications and  supplements . Functional ability and status . Nutritional status . Physical activity . Advanced directives . List of other physicians . Hospitalizations, surgeries, and ER visits in previous 12 months . Vitals . Screenings to include cognitive, depression, and falls . Referrals and appointments  In addition, I have reviewed and discussed with patient certain preventive protocols, quality metrics, and best practice recommendations. A written personalized care plan for preventive services as well as general preventive health recommendations were provided to patient.     Denyce Robert, RN   09/16/2018

## 2018-09-17 ENCOUNTER — Encounter: Payer: Self-pay | Admitting: *Deleted

## 2018-10-09 ENCOUNTER — Ambulatory Visit (INDEPENDENT_AMBULATORY_CARE_PROVIDER_SITE_OTHER): Payer: Medicare HMO | Admitting: Family

## 2018-10-09 ENCOUNTER — Encounter: Payer: Self-pay | Admitting: Family

## 2018-10-09 ENCOUNTER — Ambulatory Visit (INDEPENDENT_AMBULATORY_CARE_PROVIDER_SITE_OTHER): Payer: Medicare HMO

## 2018-10-09 ENCOUNTER — Telehealth: Payer: Self-pay | Admitting: Family

## 2018-10-09 VITALS — BP 163/92 | HR 82 | Temp 98.0°F | Ht 62.0 in | Wt 84.4 lb

## 2018-10-09 DIAGNOSIS — J441 Chronic obstructive pulmonary disease with (acute) exacerbation: Secondary | ICD-10-CM | POA: Diagnosis not present

## 2018-10-09 DIAGNOSIS — R350 Frequency of micturition: Secondary | ICD-10-CM | POA: Diagnosis not present

## 2018-10-09 DIAGNOSIS — J811 Chronic pulmonary edema: Secondary | ICD-10-CM | POA: Diagnosis not present

## 2018-10-09 DIAGNOSIS — E0865 Diabetes mellitus due to underlying condition with hyperglycemia: Secondary | ICD-10-CM | POA: Diagnosis not present

## 2018-10-09 DIAGNOSIS — J189 Pneumonia, unspecified organism: Secondary | ICD-10-CM

## 2018-10-09 DIAGNOSIS — E118 Type 2 diabetes mellitus with unspecified complications: Secondary | ICD-10-CM | POA: Diagnosis not present

## 2018-10-09 MED ORDER — INSULIN GLARGINE 100 UNIT/ML SOLOSTAR PEN: 20.0000 [IU] | PEN_INJECTOR | Freq: Every day | SUBCUTANEOUS | 3 refills | Status: DC

## 2018-10-09 MED ORDER — PEN NEEDLES 32G X 4 MM MISC: 1.0000 | Freq: Every day | 6 refills | Status: DC

## 2018-10-09 NOTE — Progress Notes (Signed)
 Subjective:    Patient ID: Jamie [name], female    DOB: 11/09/1950, 67 y.o.   MRN: [id number]  Chief Complaint  Patient presents with  . COPD    recheck   Pt presents to the office today to recheck CAP. She was diagnosed on 09/02/18 and then was hospitalized 09/08/18. She has completed her antibiotics.  COPD  Her past medical history is significant for COPD.  Urinary Frequency   This is a new problem. The current episode started in the past 7 days. The problem occurs every urination. The problem has been gradually worsening. The pain is at a severity of 0/10. The patient is experiencing no pain. Associated symptoms include frequency and urgency. Pertinent negatives include no flank pain, nausea or vomiting. She has tried nothing for the symptoms. The treatment provided mild relief.      Review of Systems  Gastrointestinal: Negative for nausea and vomiting.  Genitourinary: Positive for frequency and urgency. Negative for flank pain.  All other systems reviewed and are negative.      Objective:   Physical Exam Vitals signs reviewed.  Constitutional:      General: She is not in acute distress.    Appearance: She is well-developed.  HENT:     Head: Normocephalic and atraumatic.     Right Ear: Tympanic membrane normal.     Left Ear: Tympanic membrane normal.  Eyes:     Pupils: Pupils are equal, round, and reactive to light.  Neck:     Musculoskeletal: Normal range of motion and neck supple.     Thyroid: No thyromegaly.  Cardiovascular:     Rate and Rhythm: Normal rate and regular rhythm.     Heart sounds: Normal heart sounds. No murmur.  Pulmonary:     Effort: Pulmonary effort is normal. No respiratory distress.     Breath sounds: Normal breath sounds. No wheezing.  Abdominal:     General: Bowel sounds are normal. There is no distension.     Palpations: Abdomen is soft.     Tenderness: There is no abdominal tenderness.  Musculoskeletal:        General: Tenderness  present.  Skin:    General: Skin is warm and dry.  Neurological:     Mental Status: She is alert and oriented to person, place, and time.     Cranial Nerves: No cranial nerve deficit.     Deep Tendon Reflexes: Reflexes are normal and symmetric.  Psychiatric:        Behavior: Behavior normal.        Thought Content: Thought content normal.        Judgment: Judgment normal.       BP (!) 163/92   Pulse 82   Temp 98 F (36.7 C) (Oral)   Ht 5' 2" (1.575 m)   Wt 84 lb 6.4 oz (38.3 kg)   BMI 15.44 kg/m      Assessment & Plan:  Jamie [name] comes in today with chief complaint of COPD (recheck)   Diagnosis and orders addressed:  1. Community acquired pneumonia of left lung, unspecified part of lung - DG Chest 2 View; Future - CBC with Differential/Platelet - CMP14+EGFR  2. Urinary frequency Could be related to uncontrolled DM - Urinalysis, Complete - Urine Culture - CBC with Differential/Platelet - CMP14+EGFR  3. Diabetes mellitus due to underlying condition, uncontrolled, with hyperglycemia, without long-term current use of insulin (HCC) Will increase Lantus to 20-25 units. She will increase by   3 units every 3 days until blood glucose is less 180. Low carb diet - CBC with Differential/Platelet - CMP14+EGFR - Insulin Glargine (LANTUS SOLOSTAR) 100 UNIT/ML Solostar Pen; Inject 20-25 Units into the skin daily.  Dispense: 15 mL; Refill: 3 - Insulin Pen Needle (PEN NEEDLES) 32G X 4 MM MISC; 1 applicator by Does not apply route daily.  Dispense: 100 each; Refill: 6  4. COPD with acute exacerbation ([place])  5. Diabetes mellitus with complication (HCC) - Insulin Glargine (LANTUS SOLOSTAR) 100 UNIT/ML Solostar Pen; Inject 20-25 Units into the skin daily.  Dispense: 15 mL; Refill: 3 - Insulin Pen Needle (PEN NEEDLES) 32G X 4 MM MISC; 1 applicator by Does not apply route daily.  Dispense: 100 each; Refill: 6   Labs pending Health Maintenance reviewed Diet and exercise  encouraged  Follow up plan: 1 month   Evelina Dun, FNP

## 2018-10-09 NOTE — Patient Instructions (Signed)
Diabetes Mellitus and Nutrition, Adult  When you have diabetes (diabetes mellitus), it is very important to have healthy eating habits because your blood sugar (glucose) levels are greatly affected by what you eat and drink. Eating healthy foods in the appropriate amounts, at about the same times every day, can help you:  · Control your blood glucose.  · Lower your risk of heart disease.  · Improve your blood pressure.  · Reach or maintain a healthy weight.  Every person with diabetes is different, and each person has different needs for a meal plan. Your health care provider may recommend that you work with a diet and nutrition specialist (dietitian) to make a meal plan that is best for you. Your meal plan may vary depending on factors such as:  · The calories you need.  · The medicines you take.  · Your weight.  · Your blood glucose, blood pressure, and cholesterol levels.  · Your activity level.  · Other health conditions you have, such as heart or kidney disease.  How do carbohydrates affect me?  Carbohydrates, also called carbs, affect your blood glucose level more than any other type of food. Eating carbs naturally raises the amount of glucose in your blood. Carb counting is a method for keeping track of how many carbs you eat. Counting carbs is important to keep your blood glucose at a healthy level, especially if you use insulin or take certain oral diabetes medicines.  It is important to know how many carbs you can safely have in each meal. This is different for every person. Your dietitian can help you calculate how many carbs you should have at each meal and for each snack.  Foods that contain carbs include:  · Bread, cereal, rice, pasta, and crackers.  · Potatoes and corn.  · Peas, beans, and lentils.  · Milk and yogurt.  · Fruit and juice.  · Desserts, such as cakes, cookies, ice cream, and candy.  How does alcohol affect me?  Alcohol can cause a sudden decrease in blood glucose (hypoglycemia),  especially if you use insulin or take certain oral diabetes medicines. Hypoglycemia can be a life-threatening condition. Symptoms of hypoglycemia (sleepiness, dizziness, and confusion) are similar to symptoms of having too much alcohol.  If your health care provider says that alcohol is safe for you, follow these guidelines:  · Limit alcohol intake to no more than 1 drink per day for nonpregnant women and 2 drinks per day for men. One drink equals 12 oz of beer, 5 oz of wine, or 1½ oz of hard liquor.  · Do not drink on an empty stomach.  · Keep yourself hydrated with water, diet soda, or unsweetened iced tea.  · Keep in mind that regular soda, juice, and other mixers may contain a lot of sugar and must be counted as carbs.  What are tips for following this plan?    Reading food labels  · Start by checking the serving size on the "Nutrition Facts" label of packaged foods and drinks. The amount of calories, carbs, fats, and other nutrients listed on the label is based on one serving of the item. Many items contain more than one serving per package.  · Check the total grams (g) of carbs in one serving. You can calculate the number of servings of carbs in one serving by dividing the total carbs by 15. For example, if a food has 30 g of total carbs, it would be equal to 2   servings of carbs.  · Check the number of grams (g) of saturated and trans fats in one serving. Choose foods that have low or no amount of these fats.  · Check the number of milligrams (mg) of salt (sodium) in one serving. Most people should limit total sodium intake to less than 2,300 mg per day.  · Always check the nutrition information of foods labeled as "low-fat" or "nonfat". These foods may be higher in added sugar or refined carbs and should be avoided.  · Talk to your dietitian to identify your daily goals for nutrients listed on the label.  Shopping  · Avoid buying canned, premade, or processed foods. These foods tend to be high in fat, sodium,  and added sugar.  · Shop around the outside edge of the grocery store. This includes fresh fruits and vegetables, bulk grains, fresh meats, and fresh dairy.  Cooking  · Use low-heat cooking methods, such as baking, instead of high-heat cooking methods like deep frying.  · Cook using healthy oils, such as olive, canola, or sunflower oil.  · Avoid cooking with butter, cream, or high-fat meats.  Meal planning  · Eat meals and snacks regularly, preferably at the same times every day. Avoid going long periods of time without eating.  · Eat foods high in fiber, such as fresh fruits, vegetables, beans, and whole grains. Talk to your dietitian about how many servings of carbs you can eat at each meal.  · Eat 4-6 ounces (oz) of lean protein each day, such as lean meat, chicken, fish, eggs, or tofu. One oz of lean protein is equal to:  ? 1 oz of meat, chicken, or fish.  ? 1 egg.  ? ¼ cup of tofu.  · Eat some foods each day that contain healthy fats, such as avocado, nuts, seeds, and fish.  Lifestyle  · Check your blood glucose regularly.  · Exercise regularly as told by your health care provider. This may include:  ? 150 minutes of moderate-intensity or vigorous-intensity exercise each week. This could be brisk walking, biking, or water aerobics.  ? Stretching and doing strength exercises, such as yoga or weightlifting, at least 2 times a week.  · Take medicines as told by your health care provider.  · Do not use any products that contain nicotine or tobacco, such as cigarettes and e-cigarettes. If you need help quitting, ask your health care provider.  · Work with a counselor or diabetes educator to identify strategies to manage stress and any emotional and social challenges.  Questions to ask a health care provider  · Do I need to meet with a diabetes educator?  · Do I need to meet with a dietitian?  · What number can I call if I have questions?  · When are the best times to check my blood glucose?  Where to find more  information:  · American Diabetes Association: diabetes.org  · Academy of Nutrition and Dietetics: www.eatright.org  · National Institute of Diabetes and Digestive and Kidney Diseases (NIH): www.niddk.nih.gov  Summary  · A healthy meal plan will help you control your blood glucose and maintain a healthy lifestyle.  · Working with a diet and nutrition specialist (dietitian) can help you make a meal plan that is best for you.  · Keep in mind that carbohydrates (carbs) and alcohol have immediate effects on your blood glucose levels. It is important to count carbs and to use alcohol carefully.  This information is not intended to   replace advice given to you by your health care provider. Make sure you discuss any questions you have with your health care provider.  Document Released: 06/27/2005 Document Revised: 04/30/2017 Document Reviewed: 11/04/2016  Elsevier Interactive Patient Education © 2019 Elsevier Inc.

## 2018-10-09 NOTE — Telephone Encounter (Signed)
Informed patient of results of chest x ray

## 2018-10-10 LAB — URINALYSIS, COMPLETE
Bilirubin, UA: NEGATIVE
Ketones, UA: NEGATIVE
Leukocytes, UA: NEGATIVE
Nitrite, UA: NEGATIVE
PH UA: 5 (ref 5.0–7.5)
Protein, UA: NEGATIVE
RBC, UA: NEGATIVE
Urobilinogen, Ur: 0.2 mg/dL (ref 0.2–1.0)

## 2018-10-10 LAB — CMP14+EGFR
ALT: 12 IU/L (ref 0–32)
AST: 12 IU/L (ref 0–40)
Albumin/Globulin Ratio: 1.8 (ref 1.2–2.2)
Albumin: 3.7 g/dL (ref 3.6–4.8)
Alkaline Phosphatase: 73 IU/L (ref 39–117)
BUN/Creatinine Ratio: 20 (ref 12–28)
BUN: 16 mg/dL (ref 8–27)
CALCIUM: 8.9 mg/dL (ref 8.7–10.3)
CHLORIDE: 98 mmol/L (ref 96–106)
CO2: 23 mmol/L (ref 20–29)
CREATININE: 0.82 mg/dL (ref 0.57–1.00)
GFR calc non Af Amer: 75 mL/min/{1.73_m2} (ref >59)
GFR, EST AFRICAN AMERICAN: 86 mL/min/{1.73_m2} (ref >59)
GLUCOSE: 439 mg/dL — AB (ref 65–99)
Globulin, Total: 2.1 g/dL (ref 1.5–4.5)
Potassium: 3.8 mmol/L (ref 3.5–5.2)
Sodium: 135 mmol/L (ref 134–144)
TOTAL PROTEIN: 5.8 g/dL — AB (ref 6.0–8.5)

## 2018-10-10 LAB — MICROSCOPIC EXAMINATION: CASTS: NONE SEEN /LPF

## 2018-10-10 LAB — CBC WITH DIFFERENTIAL/PLATELET
BASOS ABS: 0 10*3/uL (ref 0.0–0.2)
Basos: 1 %
EOS (ABSOLUTE): 0.1 10*3/uL (ref 0.0–0.4)
Eos: 1 %
HEMOGLOBIN: 11.9 g/dL (ref 11.1–15.9)
Hematocrit: 36.9 % (ref 34.0–46.6)
IMMATURE GRANS (ABS): 0 10*3/uL (ref 0.0–0.1)
IMMATURE GRANULOCYTES: 1 %
Lymphocytes Absolute: 1.5 10*3/uL (ref 0.7–3.1)
Lymphs: 23 %
MCH: 28.3 pg (ref 26.6–33.0)
MCHC: 32.2 g/dL (ref 31.5–35.7)
MCV: 88 fL (ref 79–97)
MONOCYTES: 9 %
Monocytes Absolute: 0.6 10*3/uL (ref 0.1–0.9)
NEUTROS PCT: 65 %
Neutrophils Absolute: 4.2 10*3/uL (ref 1.4–7.0)
PLATELETS: 262 10*3/uL (ref 150–450)
RBC: 4.21 x10E6/uL (ref 3.77–5.28)
RDW: 13 % (ref 12.3–15.4)
WBC: 6.3 10*3/uL (ref 3.4–10.8)

## 2018-10-10 LAB — URINE CULTURE

## 2018-10-29 ENCOUNTER — Encounter: Payer: Self-pay | Admitting: *Deleted

## 2018-11-09 ENCOUNTER — Ambulatory Visit: Payer: Medicare HMO | Admitting: Family

## 2018-11-11 DIAGNOSIS — M5126 Other intervertebral disc displacement, lumbar region: Secondary | ICD-10-CM | POA: Diagnosis not present

## 2018-11-11 DIAGNOSIS — I10 Essential (primary) hypertension: Secondary | ICD-10-CM | POA: Diagnosis not present

## 2018-11-11 DIAGNOSIS — M549 Dorsalgia, unspecified: Secondary | ICD-10-CM | POA: Diagnosis not present

## 2018-11-11 DIAGNOSIS — M4015 Other secondary kyphosis, thoracolumbar region: Secondary | ICD-10-CM | POA: Diagnosis not present

## 2018-11-16 ENCOUNTER — Ambulatory Visit: Payer: Medicare HMO | Admitting: Family

## 2018-11-17 ENCOUNTER — Ambulatory Visit: Payer: Medicare HMO | Admitting: Family

## 2018-11-26 ENCOUNTER — Other Ambulatory Visit (HOSPITAL_COMMUNITY): Payer: Self-pay | Admitting: Neurological Surgery

## 2018-11-26 ENCOUNTER — Telehealth (HOSPITAL_COMMUNITY): Payer: Self-pay

## 2018-11-26 DIAGNOSIS — M4015 Other secondary kyphosis, thoracolumbar region: Secondary | ICD-10-CM

## 2018-12-01 ENCOUNTER — Ambulatory Visit: Payer: Medicare HMO | Admitting: Family

## 2018-12-01 ENCOUNTER — Telehealth: Payer: Self-pay | Admitting: Family

## 2018-12-01 NOTE — Telephone Encounter (Signed)
Attempted to contact patient - NA °

## 2018-12-04 ENCOUNTER — Ambulatory Visit (HOSPITAL_COMMUNITY)
Admission: RE | Admit: 2018-12-04 | Discharge: 2018-12-04 | Disposition: A | Discharge status: Home / Self Care | Payer: Medicare HMO | Source: Ambulatory Visit | Attending: Neurological Surgery | Admitting: Neurological Surgery

## 2018-12-04 ENCOUNTER — Other Ambulatory Visit: Payer: Self-pay | Admitting: Family

## 2018-12-04 DIAGNOSIS — M4015 Other secondary kyphosis, thoracolumbar region: Secondary | ICD-10-CM | POA: Insufficient documentation

## 2018-12-04 DIAGNOSIS — I10 Essential (primary) hypertension: Secondary | ICD-10-CM

## 2018-12-04 DIAGNOSIS — M47816 Spondylosis without myelopathy or radiculopathy, lumbar region: Secondary | ICD-10-CM | POA: Diagnosis not present

## 2018-12-04 DIAGNOSIS — M47814 Spondylosis without myelopathy or radiculopathy, thoracic region: Secondary | ICD-10-CM | POA: Diagnosis not present

## 2018-12-04 DIAGNOSIS — Z981 Arthrodesis status: Secondary | ICD-10-CM | POA: Diagnosis not present

## 2018-12-04 NOTE — Telephone Encounter (Signed)
No answer.  Losartan and pravastan were sent in today.

## 2018-12-14 ENCOUNTER — Ambulatory Visit (INDEPENDENT_AMBULATORY_CARE_PROVIDER_SITE_OTHER): Payer: Medicare HMO | Admitting: Family

## 2018-12-14 ENCOUNTER — Encounter: Payer: Self-pay | Admitting: Family

## 2018-12-14 VITALS — BP 142/79 | HR 76 | Temp 97.7°F | Ht 62.0 in | Wt 89.2 lb

## 2018-12-14 DIAGNOSIS — E1169 Type 2 diabetes mellitus with other specified complication: Secondary | ICD-10-CM

## 2018-12-14 DIAGNOSIS — E785 Hyperlipidemia, unspecified: Secondary | ICD-10-CM | POA: Diagnosis not present

## 2018-12-14 DIAGNOSIS — J441 Chronic obstructive pulmonary disease with (acute) exacerbation: Secondary | ICD-10-CM | POA: Diagnosis not present

## 2018-12-14 DIAGNOSIS — L659 Nonscarring hair loss, unspecified: Secondary | ICD-10-CM

## 2018-12-14 DIAGNOSIS — Z716 Tobacco abuse counseling: Secondary | ICD-10-CM

## 2018-12-14 DIAGNOSIS — G8929 Other chronic pain: Secondary | ICD-10-CM

## 2018-12-14 DIAGNOSIS — M5441 Lumbago with sciatica, right side: Secondary | ICD-10-CM | POA: Diagnosis not present

## 2018-12-14 DIAGNOSIS — J449 Chronic obstructive pulmonary disease, unspecified: Secondary | ICD-10-CM | POA: Diagnosis not present

## 2018-12-14 DIAGNOSIS — E0865 Diabetes mellitus due to underlying condition with hyperglycemia: Secondary | ICD-10-CM

## 2018-12-14 LAB — BAYER DCA HB A1C WAIVED: HB A1C (BAYER DCA - WAIVED): 8.9 % — ABNORMAL HIGH (ref <7.0)

## 2018-12-14 MED ORDER — BUDESONIDE-FORMOTEROL FUMARATE 80-4.5 MCG/ACT IN AERO: 2.0000 | INHALATION_SPRAY | Freq: Two times a day (BID) | RESPIRATORY_TRACT | 3 refills | Status: DC

## 2018-12-14 NOTE — Patient Instructions (Signed)
Chronic Obstructive Pulmonary Disease Chronic obstructive pulmonary disease (COPD) is a long-term (chronic) lung problem. When you have COPD, it is hard for air to get in and out of your lungs. Usually the condition gets worse over time, and your lungs will never return to normal. There are things you can do to keep yourself as healthy as possible.  Your doctor may treat your condition with: ? Medicines. ? Oxygen. ? Lung surgery.  Your doctor may also recommend: ? Rehabilitation. This includes steps to make your body work better. It may involve a team of specialists. ? Quitting smoking, if you smoke. ? Exercise and changes to your diet. ? Comfort measures (palliative care). Follow these instructions at home: Medicines  Take over-the-counter and prescription medicines only as told by your doctor.  Talk to your doctor before taking any cough or allergy medicines. You may need to avoid medicines that cause your lungs to be dry. Lifestyle  If you smoke, stop. Smoking makes the problem worse. If you need help quitting, ask your doctor.  Avoid being around things that make your breathing worse. This may include smoke, chemicals, and fumes.  Stay active, but remember to rest as well.  Learn and use tips on how to relax.  Make sure you get enough sleep. Most adults need at least 7 hours of sleep every night.  Eat healthy foods. Eat smaller meals more often. Rest before meals. Controlled breathing Learn and use tips on how to control your breathing as told by your doctor. Try:  Breathing in (inhaling) through your nose for 1 second. Then, pucker your lips and breath out (exhale) through your lips for 2 seconds.  Putting one hand on your belly (abdomen). Breathe in slowly through your nose for 1 second. Your hand on your belly should move out. Pucker your lips and breathe out slowly through your lips. Your hand on your belly should move in as you breathe out.  Controlled coughing Learn  and use controlled coughing to clear mucus from your lungs. Follow these steps: 1. Lean your head a little forward. 2. Breathe in deeply. 3. Try to hold your breath for 3 seconds. 4. Keep your mouth slightly open while coughing 2 times. 5. Spit any mucus out into a tissue. 6. Rest and do the steps again 1 or 2 times as needed. General instructions  Make sure you get all the shots (vaccines) that your doctor recommends. Ask your doctor about a flu shot and a pneumonia shot.  Use oxygen therapy and pulmonary rehabilitation if told by your doctor. If you need home oxygen therapy, ask your doctor if you should buy a tool to measure your oxygen level (oximeter).  Make a COPD action plan with your doctor. This helps you to know what to do if you feel worse than usual.  Manage any other conditions you have as told by your doctor.  Avoid going outside when it is very hot, cold, or humid.  Avoid people who have a sickness you can catch (contagious).  Keep all follow-up visits as told by your doctor. This is important. Contact a doctor if:  You cough up more mucus than usual.  There is a change in the color or thickness of the mucus.  It is harder to breathe than usual.  Your breathing is faster than usual.  You have trouble sleeping.  You need to use your medicines more often than usual.  You have trouble doing your normal activities such as getting dressed   or walking around the house. Get help right away if:  You have shortness of breath while resting.  You have shortness of breath that stops you from: ? Being able to talk. ? Doing normal activities.  Your chest hurts for longer than 5 minutes.  Your skin color is more blue than usual.  Your pulse oximeter shows that you have low oxygen for longer than 5 minutes.  You have a fever.  You feel too tired to breathe normally. Summary  Chronic obstructive pulmonary disease (COPD) is a long-term lung problem.  The way your  lungs work will never return to normal. Usually the condition gets worse over time. There are things you can do to keep yourself as healthy as possible.  Take over-the-counter and prescription medicines only as told by your doctor.  If you smoke, stop. Smoking makes the problem worse. This information is not intended to replace advice given to you by your health care provider. Make sure you discuss any questions you have with your health care provider. Document Released: 03/18/2008 Document Revised: 11/04/2016 Document Reviewed: 11/04/2016 Elsevier Interactive Patient Education  2019 Elsevier Inc.  

## 2018-12-14 NOTE — Progress Notes (Signed)
 Subjective:    Patient ID: Jamie [name], female    DOB: 11/22/1950, 67 y.o.   MRN: [id number]  Chief Complaint  Patient presents with  . Diabetes    three month recheck   PT presents to the office today for chronic follow up.  PT had surgical decompression and stabilization from L2-L4 on 02/05/17. Pt states her back continues to hurtand her pain is a10out 10 when she walks or standing. She saw her Neurosurgereon about 4 weeks ago and was scheduled for CT scan on 12/04/18.  States she has noticed increase of hair loss over the last few months.  Diabetes  She presents for her follow-up diabetic visit. She has type 2 diabetes mellitus. Her disease course has been stable. There are no hypoglycemic associated symptoms. Associated symptoms include foot paresthesias. Symptoms are stable. Diabetic complications include heart disease. Risk factors for coronary artery disease include dyslipidemia, diabetes mellitus and hypertension. She is following a generally unhealthy diet. Her overall blood glucose range is 110-130 mg/dl.  Hyperlipidemia  This is a chronic problem. The current episode started more than 1 year ago. Current antihyperlipidemic treatment includes statins. The current treatment provides moderate improvement of lipids. Risk factors for coronary artery disease include dyslipidemia, diabetes mellitus, hypertension and a sedentary lifestyle.  Gastroesophageal Reflux  She complains of coughing and wheezing. She reports no belching or no heartburn. This is a chronic problem. The current episode started more than 1 year ago. The problem occurs occasionally. She has tried a PPI for the symptoms. The treatment provided moderate relief.  COPD Uses her albuterol as needed.     Review of Systems  Respiratory: Positive for cough and wheezing.   Gastrointestinal: Negative for heartburn.  All other systems reviewed and are negative.      Objective:   Physical Exam Vitals signs  reviewed.  Constitutional:      General: She is not in acute distress.    Appearance: She is well-developed.     Comments: Underweight   HENT:     Head: Normocephalic and atraumatic. Head contusion: HOH.     Right Ear: Tympanic membrane normal.     Left Ear: Tympanic membrane normal.  Eyes:     Pupils: Pupils are equal, round, and reactive to light.  Neck:     Musculoskeletal: Normal range of motion and neck supple.     Thyroid: No thyromegaly.  Cardiovascular:     Rate and Rhythm: Normal rate and regular rhythm.     Heart sounds: Normal heart sounds. No murmur.  Pulmonary:     Effort: No respiratory distress.     Breath sounds: Wheezing present.  Abdominal:     General: Bowel sounds are normal. There is no distension.     Palpations: Abdomen is soft.     Tenderness: There is no abdominal tenderness.  Musculoskeletal:        General: No tenderness.     Comments: Pain in lumbar with flexion and extension  Skin:    General: Skin is warm and dry.  Neurological:     Mental Status: She is alert and oriented to person, place, and time.     Cranial Nerves: No cranial nerve deficit.     Deep Tendon Reflexes: Reflexes are normal and symmetric.  Psychiatric:        Behavior: Behavior normal.        Thought Content: Thought content normal.        Judgment: Judgment normal.       BP (!) 142/79   Pulse 76   Temp 97.7 F (36.5 C) (Oral)   Ht 5' 2" (1.575 m)   Wt 89 lb 3.2 oz (40.5 kg)   BMI 16.31 kg/m      Assessment & Plan:  Jamie [name] comes in today with chief complaint of Diabetes (three month recheck)   Diagnosis and orders addressed:  1. COPD with acute exacerbation (HCC) Will start Symbicort BID today Smoking cessation discussed - CMP14+EGFR - CBC with Differential/Platelet - budesonide-formoterol (SYMBICORT) 80-4.5 MCG/ACT inhaler; Inhale 2 puffs into the lungs 2 (two) times daily.  Dispense: 1 Inhaler; Refill: 3  2. Moderate COPD (chronic obstructive  pulmonary disease) (HCC) - CMP14+EGFR - CBC with Differential/Platelet  3. Diabetes mellitus due to underlying condition, uncontrolled, with hyperglycemia, without long-term current use of insulin (HCC) - Bayer DCA Hb A1c Waived - CMP14+EGFR - CBC with Differential/Platelet  4. Hyperlipidemia associated with type 2 diabetes mellitus (HCC) - CMP14+EGFR - CBC with Differential/Platelet - Lipid panel  5. Chronic bilateral low back pain with right-sided sciatica - CMP14+EGFR - CBC with Differential/Platelet  6. Tobacco abuse counseling - CMP14+EGFR - CBC with Differential/Platelet  7. Hair loss - CMP14+EGFR - CBC with Differential/Platelet - TSH   Labs pending Health Maintenance reviewed Diet and exercise encouraged  Follow up plan: 3 months     , FNP  

## 2018-12-15 ENCOUNTER — Other Ambulatory Visit: Payer: Self-pay | Admitting: Family

## 2018-12-15 ENCOUNTER — Other Ambulatory Visit: Payer: Self-pay | Admitting: Neurological Surgery

## 2018-12-15 LAB — CBC WITH DIFFERENTIAL/PLATELET
Basophils Absolute: 0 10*3/uL (ref 0.0–0.2)
Basos: 1 %
EOS (ABSOLUTE): 0.1 10*3/uL (ref 0.0–0.4)
Eos: 1 %
Hematocrit: 38.7 % (ref 34.0–46.6)
Hemoglobin: 13.3 g/dL (ref 11.1–15.9)
Immature Grans (Abs): 0 10*3/uL (ref 0.0–0.1)
Immature Granulocytes: 0 %
Lymphocytes Absolute: 1.5 10*3/uL (ref 0.7–3.1)
Lymphs: 20 %
MCH: 28.2 pg (ref 26.6–33.0)
MCHC: 34.4 g/dL (ref 31.5–35.7)
MCV: 82 fL (ref 79–97)
Monocytes Absolute: 0.6 10*3/uL (ref 0.1–0.9)
Monocytes: 7 %
Neutrophils Absolute: 5.6 10*3/uL (ref 1.4–7.0)
Neutrophils: 71 %
PLATELETS: 239 10*3/uL (ref 150–450)
RBC: 4.71 x10E6/uL (ref 3.77–5.28)
RDW: 12.4 % (ref 11.7–15.4)
WBC: 7.8 10*3/uL (ref 3.4–10.8)

## 2018-12-15 LAB — CMP14+EGFR
ALT: 12 IU/L (ref 0–32)
AST: 14 IU/L (ref 0–40)
Albumin/Globulin Ratio: 1.9 (ref 1.2–2.2)
Albumin: 3.9 g/dL (ref 3.8–4.8)
Alkaline Phosphatase: 87 IU/L (ref 39–117)
BUN/Creatinine Ratio: 16 (ref 12–28)
BUN: 15 mg/dL (ref 8–27)
Bilirubin Total: <0.2 mg/dL (ref 0.0–1.2)
CO2: 24 mmol/L (ref 20–29)
Calcium: 9.2 mg/dL (ref 8.7–10.3)
Chloride: 99 mmol/L (ref 96–106)
Creatinine, Ser: 0.96 mg/dL (ref 0.57–1.00)
GFR calc Af Amer: 71 mL/min/{1.73_m2} (ref >59)
GFR calc non Af Amer: 62 mL/min/{1.73_m2} (ref >59)
GLOBULIN, TOTAL: 2.1 g/dL (ref 1.5–4.5)
Glucose: 354 mg/dL — ABNORMAL HIGH (ref 65–99)
Potassium: 4.8 mmol/L (ref 3.5–5.2)
Sodium: 137 mmol/L (ref 134–144)
Total Protein: 6 g/dL (ref 6.0–8.5)

## 2018-12-15 LAB — LIPID PANEL
Chol/HDL Ratio: 2.8 ratio (ref 0.0–4.4)
Cholesterol, Total: 144 mg/dL (ref 100–199)
HDL: 51 mg/dL (ref >39)
LDL Calculated: 70 mg/dL (ref 0–99)
Triglycerides: 114 mg/dL (ref 0–149)
VLDL Cholesterol Cal: 23 mg/dL (ref 5–40)

## 2018-12-15 LAB — TSH: TSH: 0.787 u[IU]/mL (ref 0.450–4.500)

## 2018-12-15 MED ORDER — DAPAGLIFLOZIN PROPANEDIOL 5 MG PO TABS: 5.0000 mg | ORAL_TABLET | Freq: Every day | ORAL | 2 refills | Status: DC

## 2019-01-25 ENCOUNTER — Inpatient Hospital Stay: Admit: 2019-01-25 | Payer: Medicare HMO | Admitting: Neurological Surgery

## 2019-01-25 SURGERY — POSTERIOR LUMBAR FUSION 4 LEVEL
Anesthesia: General | Site: Back

## 2019-02-06 ENCOUNTER — Other Ambulatory Visit: Payer: Self-pay | Admitting: Family

## 2019-02-06 DIAGNOSIS — I10 Essential (primary) hypertension: Secondary | ICD-10-CM

## 2019-02-16 ENCOUNTER — Other Ambulatory Visit (HOSPITAL_COMMUNITY): Payer: Medicare HMO

## 2019-02-16 NOTE — Pre-Procedure Instructions (Signed)
Jamie [name]  02/16/2019      [facility] - [name], [name] [address] Phone: [phone] Fax: [phone]  [facility] [place] [address] [place] [address] [place] [address] [place] [address] Phone: [phone] Fax: [phone]    Your procedure is scheduled on Tuesday May 12th.  Report to [facility] Admitting Entrance "A" at 5:30 A.M.  Call this number if you have problems the morning of surgery:  [phone]  Call [phone] if you have any questions prior to your surgery date Monday-Friday 8am-4pm   Remember:  Do not eat or drink after midnight.     Take these medicines the morning of surgery with A SIP OF WATER  amLODipine (NORVASC) budesonide-formoterol (SYMBICORT) pravastatin (PRAVACHOL) albuterol (PROVENTIL) (2.5 MG/3ML) if needed  As of today,  STOP taking any Aspirin(unless otherwise instructed by your surgeon), Aleve, Naproxen, Ibuprofen, Motrin, Advil, Goody's, BC's, all herbal medications, fish oil, and all vitamins.   HOW TO MANAGE YOUR DIABETES BEFORE AND AFTER SURGERY  Why is it important to control my blood sugar before and after surgery? . Improving blood sugar levels before and after surgery helps healing and can limit problems. . A way of improving blood sugar control is eating a healthy diet by: o  Eating less sugar and carbohydrates o  Increasing activity/exercise o  Talking with your doctor about reaching your blood sugar goals . High blood sugars (greater than 180 mg/dL) can raise your risk of infections and slow your recovery, so you will need to focus on controlling your diabetes during the weeks before surgery. . Make sure that the doctor who takes care of your diabetes knows about your planned surgery including the date and location.  How do I manage my blood sugar before surgery? . Check your blood sugar at least 4 times a day, starting 2 days  before surgery, to make sure that the level is not too high or low. o Check your blood sugar the morning of your surgery when you wake up and every 2 hours until you get to the Short Stay unit. . If your blood sugar is less than 70 mg/dL, you will need to treat for low blood sugar: o Do not take insulin. o Treat a low blood sugar (less than 70 mg/dL) with  cup of clear juice (cranberry or apple), 4 glucose tablets, OR glucose gel. Recheck blood sugar in 15 minutes after treatment (to make sure it is greater than 70 mg/dL). If your blood sugar is not greater than 70 mg/dL on recheck, call [phone] o  for further instructions. . Report your blood sugar to the short stay nurse when you get to Short Stay.  . If you are admitted to the hospital after surgery: o Your blood sugar will be checked by the staff and you will probably be given insulin after surgery (instead of oral diabetes medicines) to make sure you have good blood sugar levels. o The goal for blood sugar control after surgery is 80-180 mg/dL.     WHAT DO I DO ABOUT MY DIABETES MEDICATION?   Marland Kitchen Do not take oral diabetes medicines (pills):  the morning of surgery.  . THE NIGHT BEFORE SURGERY, take 11 units of Insulin Glargine (LANTUS SOLOSTAR) insulin.       . THE MORNING OF SURGERY, take 11 units of Insulin Glargine (LANTUS SOLOSTAR) insulin.     Do  not wear jewelry, make-up or nail polish.  Do not wear lotions, powders, or perfumes, or deodorant.  Do not shave 48 hours prior to surgery.  Do not bring valuables to the hospital.  [facility] is not responsible for any belongings or valuables.  If you are a smoker, DO NOT smoke 24 hours prior to surgery.  REMEMBER THAT YOU MUST HAVE SOMEONE TO TRANSPORT YOU HOME AFTER YOUR SURGERY, AND REMAIN WITH YOU FOR 24 HOURS IF YOU ARE DISCHARGED THE SAME DAY.    Contacts, dentures or bridgework may not be worn into surgery.  Leave your suitcase in the car.  After surgery it may be  brought to your room.  For patients admitted to the hospital, discharge time will be determined by your treatment team.  Patients discharged the day of surgery will not be allowed to drive home.   [facility]- Preparing For Surgery  Before surgery, you can play an important role. Because skin is not sterile, your skin needs to be as free of germs as possible. You can reduce the number of germs on your skin by washing with CHG (chlorahexidine gluconate) Soap before surgery.  CHG is an antiseptic cleaner which kills germs and bonds with the skin to continue killing germs even after washing.    Oral Hygiene is also important to reduce your risk of infection.  Remember - BRUSH YOUR TEETH THE MORNING OF SURGERY WITH YOUR REGULAR TOOTHPASTE  Please do not use if you have an allergy to CHG or antibacterial soaps. If your skin becomes reddened/irritated stop using the CHG.  Do not shave (including legs and underarms) for at least 48 hours prior to first CHG shower. It is OK to shave your face.  Please follow these instructions carefully.   1. Shower the NIGHT BEFORE SURGERY and the MORNING OF SURGERY with CHG.   2. If you chose to wash your hair, wash your hair first as usual with your normal shampoo.  3. After you shampoo, rinse your hair and body thoroughly to remove the shampoo.  4. Use CHG as you would any other liquid soap. You can apply CHG directly to the skin and wash gently with a scrungie or a clean washcloth.   5. Apply the CHG Soap to your body ONLY FROM THE NECK DOWN.  Do not use on open wounds or open sores. Avoid contact with your eyes, ears, mouth and genitals (private parts). Wash Face and genitals (private parts)  with your normal soap.  6. Wash thoroughly, paying special attention to the area where your surgery will be performed.  7. Thoroughly rinse your body with warm water from the neck down.  8. DO NOT shower/wash with your normal soap after using and rinsing off the CHG  Soap.  9. Pat yourself dry with a CLEAN TOWEL.  10. Wear CLEAN PAJAMAS to bed the night before surgery, wear comfortable clothes the morning of surgery  11. Place CLEAN SHEETS on your bed the night of your first shower and DO NOT SLEEP WITH PETS.    Day of Surgery: Shower as stated above. Do not apply any deodorants/lotions.  Please wear clean clothes to the hospital/surgery center.   Remember to brush your teeth WITH YOUR REGULAR TOOTHPASTE.   Please read over the following fact sheets that you were given.

## 2019-02-17 ENCOUNTER — Encounter (HOSPITAL_COMMUNITY): Payer: Self-pay

## 2019-02-17 ENCOUNTER — Encounter (HOSPITAL_COMMUNITY)
Admission: RE | Admit: 2019-02-17 | Discharge: 2019-02-17 | Disposition: A | Discharge status: Home / Self Care | Payer: Medicare HMO | Source: Ambulatory Visit | Attending: Neurological Surgery | Admitting: Neurological Surgery

## 2019-02-17 ENCOUNTER — Other Ambulatory Visit: Payer: Self-pay

## 2019-02-17 DIAGNOSIS — Z1159 Encounter for screening for other viral diseases: Secondary | ICD-10-CM | POA: Insufficient documentation

## 2019-02-17 DIAGNOSIS — Z01812 Encounter for preprocedural laboratory examination: Secondary | ICD-10-CM | POA: Diagnosis not present

## 2019-02-17 HISTORY — DX: Unspecified hearing loss, unspecified ear: H91.90

## 2019-02-17 HISTORY — DX: Depression, unspecified: F32.A

## 2019-02-17 HISTORY — DX: Anxiety disorder, unspecified: F41.9

## 2019-02-17 HISTORY — DX: Major depressive disorder, single episode, unspecified: F32.9

## 2019-02-17 LAB — TYPE AND SCREEN
ABO/RH(D): A POS
Antibody Screen: NEGATIVE

## 2019-02-17 LAB — CBC
HCT: 41.2 % (ref 36.0–46.0)
Hemoglobin: 13.3 g/dL (ref 12.0–15.0)
MCH: 27.9 pg (ref 26.0–34.0)
MCHC: 32.3 g/dL (ref 30.0–36.0)
MCV: 86.4 fL (ref 80.0–100.0)
Platelets: 205 10*3/uL (ref 150–400)
RBC: 4.77 MIL/uL (ref 3.87–5.11)
RDW: 14 % (ref 11.5–15.5)
WBC: 5.2 10*3/uL (ref 4.0–10.5)
nRBC: 0 % (ref 0.0–0.2)

## 2019-02-17 LAB — BASIC METABOLIC PANEL
Anion gap: 8 (ref 5–15)
BUN: 19 mg/dL (ref 8–23)
CO2: 26 mmol/L (ref 22–32)
Calcium: 8.9 mg/dL (ref 8.9–10.3)
Chloride: 103 mmol/L (ref 98–111)
Creatinine, Ser: 0.87 mg/dL (ref 0.44–1.00)
GFR calc Af Amer: >60 mL/min (ref >60)
GFR calc non Af Amer: >60 mL/min (ref >60)
Glucose, Bld: 301 mg/dL — ABNORMAL HIGH (ref 70–99)
Potassium: 4.3 mmol/L (ref 3.5–5.1)
Sodium: 137 mmol/L (ref 135–145)

## 2019-02-17 LAB — HEMOGLOBIN A1C
Hgb A1c MFr Bld: 8.1 % — ABNORMAL HIGH (ref 4.8–5.6)
Mean Plasma Glucose: 185.77 mg/dL

## 2019-02-17 LAB — SURGICAL PCR SCREEN
MRSA, PCR: NEGATIVE
Staphylococcus aureus: NEGATIVE

## 2019-02-17 NOTE — Progress Notes (Signed)
PCP -  Cardiologist -   Chest x-ray - 11/19 EKG - 11=2/19  Checks Blood Sugar _3____ times a day  Blood Thinner Instructions: Aspirin Instructions:stop Anesthesia review: cgb 301   ekg review Patient denies shortness of breath, fever, cough and chest pain at PAT appointment   Patient verbalized understanding of instructions that were given to them at the PAT appointment. Patient was also instructed that they will need to review over the PAT instructions again at home before surgery.

## 2019-02-17 NOTE — Pre-Procedure Instructions (Signed)
PCP - Dr Lenna Gilford Cardiologist - none  Chest x-ray - 12/19 EKG - 11/19 Stress Test - none        Fasting Blood Sugar - 125 Checks Blood Sugar 3_____ times a day  Blood Thinner Instructions:none Aspirin Instructions:stop  Anesthesia review: pt is deaf, will do betadine dos if positive due to inability to understand directions  Patient denies shortness of breath, fever, cough and chest pain at PAT appointment   Patient verbalized understanding of instructions that were given to them at the PAT appointment. Patient was also instructed that they will need to review over the PAT instructions again at home before surgery.     Jamie [name]

## 2019-02-18 ENCOUNTER — Other Ambulatory Visit: Payer: Self-pay | Admitting: Neurological Surgery

## 2019-02-18 NOTE — Progress Notes (Signed)
Called [facility], left message for Janett Billow for an order for COVID testing.

## 2019-02-18 NOTE — Anesthesia Preprocedure Evaluation (Addendum)
Anesthesia Evaluation  Patient identified by MRN, date of birth, ID band Patient awake    Reviewed: Allergy & Precautions, NPO status , Patient's Chart, lab work & pertinent test results  Airway Mallampati: II  TM Distance: >3 FB Neck ROM: Full    Dental no notable dental hx. (+) Teeth Intact   Pulmonary pneumonia, resolved, COPD,  COPD inhaler, Current Smoker,  Lung nodule   Pulmonary exam normal breath sounds clear to auscultation       Cardiovascular hypertension, Pt. on medications + CAD  Normal cardiovascular exam Rhythm:Regular Rate:Normal     Neuro/Psych PSYCHIATRIC DISORDERS Anxiety Depression HOH  Neuromuscular disease    GI/Hepatic Neg liver ROS, GERD  Medicated and Controlled,  Endo/Other  diabetes, Well Controlled, Type 2, Insulin DependentHLD Hx/o breast Ca s/p RT  Renal/GU negative Renal ROS  negative genitourinary   Musculoskeletal Kyphosis thoracolumbar secondary OA Chronic back pain   Abdominal   Peds  Hematology   Anesthesia Other Findings   Reproductive/Obstetrics                          Anesthesia Physical Anesthesia Plan  ASA: III  Anesthesia Plan: General   Post-op Pain Management:    Induction: Intravenous  PONV Risk Score and Plan: 3 and Ondansetron, Treatment may vary due to age or medical condition, Midazolam and Scopolamine patch - Pre-op  Airway Management Planned: Oral ETT  Additional Equipment:   Intra-op Plan:   Post-operative Plan: Extubation in OR  Informed Consent: I have reviewed the patients History and Physical, chart, labs and discussed the procedure including the risks, benefits and alternatives for the proposed anesthesia with the patient or authorized representative who has indicated his/her understanding and acceptance.     Dental advisory given  Plan Discussed with: CRNA and Surgeon  Anesthesia Plan Comments: (Moderate COPD  followed by PCP. Last exacerbation 08/2018. At baseline per last OV 12/14/2018.   Poorly controlled DMII. A1c 8.1 and CBG 301 on 02/17/19. Results called to Dr. Clarice Pole office. DM coordinator consulted.)     Anesthesia Quick Evaluation

## 2019-02-19 ENCOUNTER — Other Ambulatory Visit (HOSPITAL_COMMUNITY)
Admission: RE | Admit: 2019-02-19 | Discharge: 2019-02-19 | Disposition: A | Discharge status: Home / Self Care | Payer: Medicare HMO | Source: Ambulatory Visit | Attending: Neurological Surgery | Admitting: Neurological Surgery

## 2019-02-19 ENCOUNTER — Other Ambulatory Visit: Payer: Self-pay

## 2019-02-19 DIAGNOSIS — Z1159 Encounter for screening for other viral diseases: Secondary | ICD-10-CM | POA: Diagnosis not present

## 2019-02-19 DIAGNOSIS — Z01812 Encounter for preprocedural laboratory examination: Secondary | ICD-10-CM | POA: Diagnosis not present

## 2019-02-21 LAB — NOVEL CORONAVIRUS, NAA (HOSP ORDER, SEND-OUT TO REF LAB; TAT 18-24 HRS): SARS-CoV-2, NAA: NOT DETECTED

## 2019-02-22 ENCOUNTER — Encounter (HOSPITAL_COMMUNITY): Payer: Self-pay | Admitting: Anesthesiology

## 2019-02-23 ENCOUNTER — Inpatient Hospital Stay (HOSPITAL_COMMUNITY): Payer: Medicare HMO

## 2019-02-23 ENCOUNTER — Inpatient Hospital Stay (HOSPITAL_COMMUNITY): Payer: Medicare HMO | Admitting: Physician Assistant

## 2019-02-23 ENCOUNTER — Encounter (HOSPITAL_COMMUNITY): Payer: Self-pay

## 2019-02-23 ENCOUNTER — Encounter (HOSPITAL_COMMUNITY)
Admission: RE | Disposition: A | Discharge status: Home Health | Payer: Self-pay | Source: Home / Self Care | Attending: Neurological Surgery

## 2019-02-23 ENCOUNTER — Other Ambulatory Visit: Payer: Self-pay

## 2019-02-23 ENCOUNTER — Inpatient Hospital Stay (HOSPITAL_COMMUNITY)
Admission: RE | Admit: 2019-02-23 | Discharge: 2019-02-25 | DRG: 454 | Disposition: A | Discharge status: Home Health | Payer: Medicare HMO | Attending: Neurological Surgery | Admitting: Neurological Surgery

## 2019-02-23 ENCOUNTER — Ambulatory Visit (HOSPITAL_COMMUNITY)
Admission: RE | Admit: 2019-02-23 | Discharge: 2019-02-23 | Disposition: A | Discharge status: Home / Self Care | Payer: Medicare HMO | Source: Home / Self Care | Attending: Neurological Surgery | Admitting: Neurological Surgery

## 2019-02-23 DIAGNOSIS — Z981 Arthrodesis status: Secondary | ICD-10-CM | POA: Diagnosis not present

## 2019-02-23 DIAGNOSIS — Z419 Encounter for procedure for purposes other than remedying health state, unspecified: Secondary | ICD-10-CM

## 2019-02-23 DIAGNOSIS — G8929 Other chronic pain: Secondary | ICD-10-CM | POA: Diagnosis present

## 2019-02-23 DIAGNOSIS — J441 Chronic obstructive pulmonary disease with (acute) exacerbation: Secondary | ICD-10-CM | POA: Diagnosis not present

## 2019-02-23 DIAGNOSIS — Z853 Personal history of malignant neoplasm of breast: Secondary | ICD-10-CM

## 2019-02-23 DIAGNOSIS — Z79899 Other long term (current) drug therapy: Secondary | ICD-10-CM

## 2019-02-23 DIAGNOSIS — M4326 Fusion of spine, lumbar region: Secondary | ICD-10-CM | POA: Diagnosis not present

## 2019-02-23 DIAGNOSIS — D62 Acute posthemorrhagic anemia: Secondary | ICD-10-CM | POA: Diagnosis not present

## 2019-02-23 DIAGNOSIS — M546 Pain in thoracic spine: Secondary | ICD-10-CM | POA: Diagnosis not present

## 2019-02-23 DIAGNOSIS — J189 Pneumonia, unspecified organism: Secondary | ICD-10-CM | POA: Diagnosis not present

## 2019-02-23 DIAGNOSIS — E119 Type 2 diabetes mellitus without complications: Secondary | ICD-10-CM | POA: Diagnosis present

## 2019-02-23 DIAGNOSIS — A419 Sepsis, unspecified organism: Secondary | ICD-10-CM | POA: Diagnosis not present

## 2019-02-23 DIAGNOSIS — Z923 Personal history of irradiation: Secondary | ICD-10-CM

## 2019-02-23 DIAGNOSIS — M4005 Postural kyphosis, thoracolumbar region: Secondary | ICD-10-CM | POA: Diagnosis not present

## 2019-02-23 DIAGNOSIS — M48061 Spinal stenosis, lumbar region without neurogenic claudication: Secondary | ICD-10-CM | POA: Diagnosis not present

## 2019-02-23 DIAGNOSIS — F419 Anxiety disorder, unspecified: Secondary | ICD-10-CM | POA: Diagnosis present

## 2019-02-23 DIAGNOSIS — F17210 Nicotine dependence, cigarettes, uncomplicated: Secondary | ICD-10-CM | POA: Diagnosis present

## 2019-02-23 DIAGNOSIS — Z794 Long term (current) use of insulin: Secondary | ICD-10-CM | POA: Diagnosis not present

## 2019-02-23 DIAGNOSIS — E785 Hyperlipidemia, unspecified: Secondary | ICD-10-CM | POA: Diagnosis not present

## 2019-02-23 DIAGNOSIS — Z7951 Long term (current) use of inhaled steroids: Secondary | ICD-10-CM

## 2019-02-23 DIAGNOSIS — Z833 Family history of diabetes mellitus: Secondary | ICD-10-CM

## 2019-02-23 DIAGNOSIS — J449 Chronic obstructive pulmonary disease, unspecified: Secondary | ICD-10-CM | POA: Diagnosis present

## 2019-02-23 DIAGNOSIS — M5116 Intervertebral disc disorders with radiculopathy, lumbar region: Secondary | ICD-10-CM | POA: Diagnosis not present

## 2019-02-23 DIAGNOSIS — H9190 Unspecified hearing loss, unspecified ear: Secondary | ICD-10-CM | POA: Diagnosis not present

## 2019-02-23 DIAGNOSIS — F329 Major depressive disorder, single episode, unspecified: Secondary | ICD-10-CM | POA: Diagnosis not present

## 2019-02-23 DIAGNOSIS — M47814 Spondylosis without myelopathy or radiculopathy, thoracic region: Secondary | ICD-10-CM | POA: Diagnosis not present

## 2019-02-23 DIAGNOSIS — M5126 Other intervertebral disc displacement, lumbar region: Secondary | ICD-10-CM | POA: Diagnosis not present

## 2019-02-23 DIAGNOSIS — Z888 Allergy status to other drugs, medicaments and biological substances status: Secondary | ICD-10-CM

## 2019-02-23 DIAGNOSIS — I10 Essential (primary) hypertension: Secondary | ICD-10-CM | POA: Diagnosis not present

## 2019-02-23 DIAGNOSIS — M4015 Other secondary kyphosis, thoracolumbar region: Secondary | ICD-10-CM | POA: Diagnosis not present

## 2019-02-23 HISTORY — PX: POSTERIOR LUMBAR FUSION 4 LEVEL: SHX6037

## 2019-02-23 HISTORY — PX: APPLICATION OF ROBOTIC ASSISTANCE FOR SPINAL PROCEDURE: SHX6753

## 2019-02-23 LAB — GLUCOSE, CAPILLARY
Glucose-Capillary: 161 mg/dL — ABNORMAL HIGH (ref 70–99)
Glucose-Capillary: 228 mg/dL — ABNORMAL HIGH (ref 70–99)
Glucose-Capillary: 181 mg/dL — ABNORMAL HIGH (ref 70–99)

## 2019-02-23 LAB — POCT I-STAT 7, (LYTES, BLD GAS, ICA,H+H)
Acid-base deficit: 2 mmol/L (ref 0.0–2.0)
Bicarbonate: 25.1 mmol/L (ref 20.0–28.0)
Calcium, Ion: 1.2 mmol/L (ref 1.15–1.40)
HCT: 29 % — ABNORMAL LOW (ref 36.0–46.0)
Hemoglobin: 9.9 g/dL — ABNORMAL LOW (ref 12.0–15.0)
O2 Saturation: 100 %
Patient temperature: 34.7
Potassium: 3.9 mmol/L (ref 3.5–5.1)
Sodium: 139 mmol/L (ref 135–145)
TCO2: 27 mmol/L (ref 22–32)
pCO2 arterial: 48.9 mmHg — ABNORMAL HIGH (ref 32.0–48.0)
pH, Arterial: 7.306 — ABNORMAL LOW (ref 7.350–7.450)
pO2, Arterial: 222 mmHg — ABNORMAL HIGH (ref 83.0–108.0)

## 2019-02-23 SURGERY — POSTERIOR LUMBAR FUSION 4 LEVEL
Anesthesia: General | Site: Back

## 2019-02-23 MED ORDER — DIPHENHYDRAMINE HCL 50 MG/ML IJ SOLN
INTRAMUSCULAR | Status: AC
  Filled 2019-02-23: qty 1

## 2019-02-23 MED ORDER — SUGAMMADEX SODIUM 200 MG/2ML IV SOLN
INTRAVENOUS | Status: DC | PRN
  Administered 2019-02-23: 87 mg via INTRAVENOUS

## 2019-02-23 MED ORDER — INSULIN ASPART 100 UNIT/ML ~~LOC~~ SOLN
SUBCUTANEOUS | Status: AC
  Filled 2019-02-23: qty 1

## 2019-02-23 MED ORDER — ACETAMINOPHEN 325 MG PO TABS: 650.0000 mg | ORAL_TABLET | ORAL | Status: DC | PRN

## 2019-02-23 MED ORDER — 0.9 % SODIUM CHLORIDE (POUR BTL) OPTIME
TOPICAL | Status: DC | PRN
  Administered 2019-02-23 (×2): 1000 mL

## 2019-02-23 MED ORDER — METHOCARBAMOL 1000 MG/10ML IJ SOLN
500.0000 mg | Freq: Four times a day (QID) | INTRAVENOUS | Status: DC | PRN
  Filled 2019-02-23: qty 5

## 2019-02-23 MED ORDER — SODIUM CHLORIDE 0.9 % IV SOLN
INTRAVENOUS | Status: DC | PRN
  Administered 2019-02-23: 45 ug/min via INTRAVENOUS
  Administered 2019-02-23: 09:00:00 35 ug/min via INTRAVENOUS

## 2019-02-23 MED ORDER — ALUM & MAG HYDROXIDE-SIMETH 200-200-20 MG/5ML PO SUSP: 30.0000 mL | Freq: Four times a day (QID) | ORAL | Status: DC | PRN

## 2019-02-23 MED ORDER — KETAMINE HCL 50 MG/5ML IJ SOSY
PREFILLED_SYRINGE | INTRAMUSCULAR | Status: AC
  Filled 2019-02-23: qty 5

## 2019-02-23 MED ORDER — METOCLOPRAMIDE HCL 5 MG/ML IJ SOLN: 10.0000 mg | Freq: Once | INTRAMUSCULAR | Status: DC | PRN

## 2019-02-23 MED ORDER — PHENYLEPHRINE 40 MCG/ML (10ML) SYRINGE FOR IV PUSH (FOR BLOOD PRESSURE SUPPORT)
PREFILLED_SYRINGE | INTRAVENOUS | Status: AC
  Filled 2019-02-23: qty 10

## 2019-02-23 MED ORDER — MIDAZOLAM HCL 2 MG/2ML IJ SOLN
INTRAMUSCULAR | Status: DC | PRN
  Administered 2019-02-23: 2 mg via INTRAVENOUS

## 2019-02-23 MED ORDER — DIPHENHYDRAMINE HCL 50 MG/ML IJ SOLN
INTRAMUSCULAR | Status: DC | PRN
  Administered 2019-02-23: 12.5 mg via INTRAVENOUS

## 2019-02-23 MED ORDER — HYDROMORPHONE HCL 1 MG/ML IJ SOLN
INTRAMUSCULAR | Status: AC
  Filled 2019-02-23: qty 1

## 2019-02-23 MED ORDER — CEFAZOLIN SODIUM 1 G IJ SOLR
INTRAMUSCULAR | Status: AC
  Filled 2019-02-23: qty 20

## 2019-02-23 MED ORDER — THROMBIN 5000 UNITS EX SOLR
CUTANEOUS | Status: AC
  Filled 2019-02-23: qty 5000

## 2019-02-23 MED ORDER — SUCCINYLCHOLINE CHLORIDE 200 MG/10ML IV SOSY
PREFILLED_SYRINGE | INTRAVENOUS | Status: AC
  Filled 2019-02-23: qty 10

## 2019-02-23 MED ORDER — SENNA 8.6 MG PO TABS
1.0000 | ORAL_TABLET | Freq: Two times a day (BID) | ORAL | Status: DC
  Administered 2019-02-25: 8.6 mg via ORAL
  Filled 2019-02-23 (×3): qty 1

## 2019-02-23 MED ORDER — CEFAZOLIN SODIUM-DEXTROSE 2-4 GM/100ML-% IV SOLN
2.0000 g | Freq: Three times a day (TID) | INTRAVENOUS | Status: AC
  Administered 2019-02-23 – 2019-02-24 (×2): 2 g via INTRAVENOUS
  Filled 2019-02-23 (×2): qty 100

## 2019-02-23 MED ORDER — LIDOCAINE 2% (20 MG/ML) 5 ML SYRINGE
INTRAMUSCULAR | Status: DC | PRN
  Administered 2019-02-23: 60 mg via INTRAVENOUS

## 2019-02-23 MED ORDER — ONDANSETRON HCL 4 MG/2ML IJ SOLN
INTRAMUSCULAR | Status: AC
  Filled 2019-02-23: qty 2

## 2019-02-23 MED ORDER — KETOROLAC TROMETHAMINE 15 MG/ML IJ SOLN
INTRAMUSCULAR | Status: AC
  Filled 2019-02-23: qty 1

## 2019-02-23 MED ORDER — PROPOFOL 10 MG/ML IV BOLUS
INTRAVENOUS | Status: AC
  Filled 2019-02-23: qty 20

## 2019-02-23 MED ORDER — DEXAMETHASONE SODIUM PHOSPHATE 10 MG/ML IJ SOLN
INTRAMUSCULAR | Status: AC
  Filled 2019-02-23: qty 1

## 2019-02-23 MED ORDER — FLEET ENEMA 7-19 GM/118ML RE ENEM: 1.0000 | ENEMA | Freq: Once | RECTAL | Status: DC | PRN

## 2019-02-23 MED ORDER — INSULIN ASPART 100 UNIT/ML ~~LOC~~ SOLN
6.0000 [IU] | Freq: Once | SUBCUTANEOUS | Status: AC
  Administered 2019-02-23: 6 [IU] via SUBCUTANEOUS

## 2019-02-23 MED ORDER — POLYETHYLENE GLYCOL 3350 17 G PO PACK: 17.0000 g | PACK | Freq: Every day | ORAL | Status: DC | PRN

## 2019-02-23 MED ORDER — SUCCINYLCHOLINE CHLORIDE 200 MG/10ML IV SOSY
PREFILLED_SYRINGE | INTRAVENOUS | Status: DC | PRN
  Administered 2019-02-23: 80 mg via INTRAVENOUS

## 2019-02-23 MED ORDER — INSULIN ASPART 100 UNIT/ML ~~LOC~~ SOLN
0.0000 [IU] | Freq: Three times a day (TID) | SUBCUTANEOUS | Status: DC
  Administered 2019-02-24 (×2): 3 [IU] via SUBCUTANEOUS
  Administered 2019-02-25: 2 [IU] via SUBCUTANEOUS

## 2019-02-23 MED ORDER — ROCURONIUM BROMIDE 10 MG/ML (PF) SYRINGE
PREFILLED_SYRINGE | INTRAVENOUS | Status: AC
  Filled 2019-02-23: qty 10

## 2019-02-23 MED ORDER — CEFAZOLIN SODIUM-DEXTROSE 2-3 GM-%(50ML) IV SOLR
INTRAVENOUS | Status: DC | PRN
  Administered 2019-02-23 (×2): 2 g via INTRAVENOUS

## 2019-02-23 MED ORDER — METHOCARBAMOL 500 MG PO TABS
500.0000 mg | ORAL_TABLET | Freq: Four times a day (QID) | ORAL | Status: DC | PRN
  Administered 2019-02-23 – 2019-02-25 (×3): 500 mg via ORAL
  Filled 2019-02-23 (×3): qty 1

## 2019-02-23 MED ORDER — LIDOCAINE-EPINEPHRINE 1 %-1:100000 IJ SOLN
INTRAMUSCULAR | Status: DC | PRN
  Administered 2019-02-23: 5 mL

## 2019-02-23 MED ORDER — FENTANYL CITRATE (PF) 250 MCG/5ML IJ SOLN
INTRAMUSCULAR | Status: AC
  Filled 2019-02-23: qty 5

## 2019-02-23 MED ORDER — SODIUM CHLORIDE 0.9% FLUSH
3.0000 mL | Freq: Two times a day (BID) | INTRAVENOUS | Status: DC
  Administered 2019-02-23 – 2019-02-25 (×4): 3 mL via INTRAVENOUS

## 2019-02-23 MED ORDER — ACETAMINOPHEN 650 MG RE SUPP: 650.0000 mg | RECTAL | Status: DC | PRN

## 2019-02-23 MED ORDER — DEXAMETHASONE SODIUM PHOSPHATE 10 MG/ML IJ SOLN
INTRAMUSCULAR | Status: DC | PRN
  Administered 2019-02-23: 10 mg via INTRAVENOUS

## 2019-02-23 MED ORDER — OXYCODONE-ACETAMINOPHEN 5-325 MG PO TABS
1.0000 | ORAL_TABLET | ORAL | Status: DC | PRN
  Administered 2019-02-23: 2 via ORAL
  Administered 2019-02-24 – 2019-02-25 (×2): 1 via ORAL
  Filled 2019-02-23: qty 2
  Filled 2019-02-23 (×2): qty 1

## 2019-02-23 MED ORDER — ALBUTEROL SULFATE HFA 108 (90 BASE) MCG/ACT IN AERS
INHALATION_SPRAY | RESPIRATORY_TRACT | Status: DC | PRN
  Administered 2019-02-23: 2 via RESPIRATORY_TRACT

## 2019-02-23 MED ORDER — LACTATED RINGERS IV SOLN
INTRAVENOUS | Status: DC
  Administered 2019-02-23: 23:00:00 via INTRAVENOUS

## 2019-02-23 MED ORDER — EPHEDRINE 5 MG/ML INJ
INTRAVENOUS | Status: AC
  Filled 2019-02-23: qty 10

## 2019-02-23 MED ORDER — PROPOFOL 10 MG/ML IV BOLUS
INTRAVENOUS | Status: DC | PRN
  Administered 2019-02-23: 100 mg via INTRAVENOUS

## 2019-02-23 MED ORDER — MORPHINE SULFATE (PF) 2 MG/ML IV SOLN: 2.0000 mg | INTRAVENOUS | Status: DC | PRN

## 2019-02-23 MED ORDER — MIDAZOLAM HCL 2 MG/2ML IJ SOLN
INTRAMUSCULAR | Status: AC
  Filled 2019-02-23: qty 2

## 2019-02-23 MED ORDER — GLYCOPYRROLATE 0.2 MG/ML IJ SOLN
INTRAMUSCULAR | Status: DC | PRN
  Administered 2019-02-23: 0.2 mg via INTRAVENOUS

## 2019-02-23 MED ORDER — FENTANYL CITRATE (PF) 250 MCG/5ML IJ SOLN
INTRAMUSCULAR | Status: DC | PRN
  Administered 2019-02-23 (×4): 50 ug via INTRAVENOUS

## 2019-02-23 MED ORDER — SODIUM CHLORIDE 0.9 % IV SOLN
INTRAVENOUS | Status: DC | PRN
  Administered 2019-02-23: 07:00:00

## 2019-02-23 MED ORDER — ALBUTEROL SULFATE HFA 108 (90 BASE) MCG/ACT IN AERS
INHALATION_SPRAY | RESPIRATORY_TRACT | Status: AC
  Filled 2019-02-23: qty 13.4

## 2019-02-23 MED ORDER — ONDANSETRON HCL 4 MG PO TABS: 4.0000 mg | ORAL_TABLET | Freq: Four times a day (QID) | ORAL | Status: DC | PRN

## 2019-02-23 MED ORDER — AMLODIPINE BESYLATE 5 MG PO TABS
5.0000 mg | ORAL_TABLET | Freq: Every day | ORAL | Status: DC
  Administered 2019-02-25: 5 mg via ORAL
  Filled 2019-02-23 (×2): qty 1

## 2019-02-23 MED ORDER — LACTATED RINGERS IV SOLN
INTRAVENOUS | Status: DC | PRN
  Administered 2019-02-23 (×2): via INTRAVENOUS

## 2019-02-23 MED ORDER — ROCURONIUM BROMIDE 50 MG/5ML IV SOSY
PREFILLED_SYRINGE | INTRAVENOUS | Status: DC | PRN
  Administered 2019-02-23: 50 mg via INTRAVENOUS

## 2019-02-23 MED ORDER — ONDANSETRON HCL 4 MG/2ML IJ SOLN
INTRAMUSCULAR | Status: DC | PRN
  Administered 2019-02-23 (×2): 4 mg via INTRAVENOUS

## 2019-02-23 MED ORDER — PEN NEEDLES 32G X 4 MM MISC: 1.0000 | Freq: Every day | Status: DC

## 2019-02-23 MED ORDER — EPHEDRINE SULFATE 50 MG/ML IJ SOLN
INTRAMUSCULAR | Status: DC | PRN
  Administered 2019-02-23: 5 mg via INTRAVENOUS

## 2019-02-23 MED ORDER — LIDOCAINE-EPINEPHRINE 1 %-1:100000 IJ SOLN
INTRAMUSCULAR | Status: AC
  Filled 2019-02-23: qty 1

## 2019-02-23 MED ORDER — KETAMINE HCL 10 MG/ML IJ SOLN
INTRAMUSCULAR | Status: DC | PRN
  Administered 2019-02-23 (×2): 10 mg via INTRAVENOUS
  Administered 2019-02-23: 30 mg via INTRAVENOUS
  Administered 2019-02-23 (×3): 10 mg via INTRAVENOUS

## 2019-02-23 MED ORDER — BUPIVACAINE HCL (PF) 0.5 % IJ SOLN
INTRAMUSCULAR | Status: DC | PRN
  Administered 2019-02-23: 25 mL
  Administered 2019-02-23: 5 mL

## 2019-02-23 MED ORDER — THROMBIN 20000 UNITS EX SOLR
CUTANEOUS | Status: AC
  Filled 2019-02-23: qty 20000

## 2019-02-23 MED ORDER — THROMBIN 5000 UNITS EX SOLR
OROMUCOSAL | Status: DC | PRN
  Administered 2019-02-23 (×2): via TOPICAL

## 2019-02-23 MED ORDER — PHENOL 1.4 % MT LIQD: 1.0000 | OROMUCOSAL | Status: DC | PRN

## 2019-02-23 MED ORDER — DOCUSATE SODIUM 100 MG PO CAPS
100.0000 mg | ORAL_CAPSULE | Freq: Two times a day (BID) | ORAL | Status: DC
  Administered 2019-02-25: 100 mg via ORAL
  Filled 2019-02-23 (×3): qty 1

## 2019-02-23 MED ORDER — PRAVASTATIN SODIUM 10 MG PO TABS
20.0000 mg | ORAL_TABLET | Freq: Every day | ORAL | Status: DC
  Administered 2019-02-25: 20 mg via ORAL
  Filled 2019-02-23 (×2): qty 2

## 2019-02-23 MED ORDER — SODIUM CHLORIDE 0.9% FLUSH: 3.0000 mL | INTRAVENOUS | Status: DC | PRN

## 2019-02-23 MED ORDER — PHENYLEPHRINE HCL (PRESSORS) 10 MG/ML IV SOLN
INTRAVENOUS | Status: DC | PRN
  Administered 2019-02-23 (×3): 80 ug via INTRAVENOUS

## 2019-02-23 MED ORDER — LOSARTAN POTASSIUM 50 MG PO TABS
100.0000 mg | ORAL_TABLET | Freq: Every day | ORAL | Status: DC
  Administered 2019-02-25: 100 mg via ORAL
  Filled 2019-02-23 (×2): qty 2

## 2019-02-23 MED ORDER — ONDANSETRON HCL 4 MG/2ML IJ SOLN: 4.0000 mg | Freq: Four times a day (QID) | INTRAMUSCULAR | Status: DC | PRN

## 2019-02-23 MED ORDER — SODIUM CHLORIDE 0.9 % IV SOLN: 250.0000 mL | INTRAVENOUS | Status: DC

## 2019-02-23 MED ORDER — KETOROLAC TROMETHAMINE 15 MG/ML IJ SOLN
7.5000 mg | Freq: Four times a day (QID) | INTRAMUSCULAR | Status: AC
  Administered 2019-02-23 – 2019-02-24 (×3): 7.5 mg via INTRAVENOUS
  Filled 2019-02-23 (×2): qty 1

## 2019-02-23 MED ORDER — MENTHOL 3 MG MT LOZG: 1.0000 | LOZENGE | OROMUCOSAL | Status: DC | PRN

## 2019-02-23 MED ORDER — INSULIN GLARGINE 100 UNIT/ML ~~LOC~~ SOLN
22.0000 [IU] | Freq: Every day | SUBCUTANEOUS | Status: DC
  Administered 2019-02-24 – 2019-02-25 (×2): 22 [IU] via SUBCUTANEOUS
  Filled 2019-02-23 (×2): qty 0.22

## 2019-02-23 MED ORDER — HYDROMORPHONE HCL 1 MG/ML IJ SOLN
0.2500 mg | INTRAMUSCULAR | Status: DC | PRN
  Administered 2019-02-23: 0.5 mg via INTRAVENOUS

## 2019-02-23 MED ORDER — BUPIVACAINE HCL (PF) 0.5 % IJ SOLN
INTRAMUSCULAR | Status: AC
  Filled 2019-02-23: qty 30

## 2019-02-23 MED ORDER — THROMBIN 20000 UNITS EX KIT
PACK | CUTANEOUS | Status: AC
  Filled 2019-02-23: qty 1

## 2019-02-23 MED ORDER — LIDOCAINE 2% (20 MG/ML) 5 ML SYRINGE
INTRAMUSCULAR | Status: AC
  Filled 2019-02-23: qty 5

## 2019-02-23 MED ORDER — BISACODYL 10 MG RE SUPP: 10.0000 mg | Freq: Every day | RECTAL | Status: DC | PRN

## 2019-02-23 MED ORDER — ALBUMIN HUMAN 5 % IV SOLN
INTRAVENOUS | Status: DC | PRN
  Administered 2019-02-23: 12:00:00 via INTRAVENOUS

## 2019-02-23 SURGICAL SUPPLY — 99 items
ADH SKN CLS APL DERMABOND .7 (GAUZE/BANDAGES/DRESSINGS) ×2
APL SRG 60D 8 XTD TIP BNDBL (TIP)
BAG DECANTER FOR FLEXI CONT (MISCELLANEOUS) ×3 IMPLANT
BASKET BONE COLLECTION (BASKET) ×3 IMPLANT
BIT DRILL LONG 3.0X30 (BIT) IMPLANT
BIT DRILL LONG 3X80 (BIT) IMPLANT
BIT DRILL LONG 4X80 (BIT) IMPLANT
BIT DRILL SHORT 3.0X30 (BIT) ×1 IMPLANT
BIT DRILL SHORT 3X80 (BIT) IMPLANT
BLADE CLIPPER SURG (BLADE) IMPLANT
BLADE SURG 11 STRL SS (BLADE) ×3 IMPLANT
BONE CANC CHIPS 40CC CAN1/2 (Bone Implant) ×3 IMPLANT
BUR MATCHSTICK NEURO 3.0 LAGG (BURR) ×3 IMPLANT
CAGE COROENT LC 8X9X25-8 (Cage) ×1 IMPLANT
CANISTER SUCT 3000ML PPV (MISCELLANEOUS) ×3 IMPLANT
CATH FOLEY 2WAY SLVR  5CC 14FR (CATHETERS) ×1
CATH FOLEY 2WAY SLVR 5CC 14FR (CATHETERS) IMPLANT
CEMENT BONE KYPHX HV R (Orthopedic Implant) ×1 IMPLANT
CEMENT KYPHON C01A KIT/MIXER (Cement) ×1 IMPLANT
CHIPS CANC BONE 40CC CAN1/2 (Bone Implant) ×2 IMPLANT
CONT SPEC 4OZ CLIKSEAL STRL BL (MISCELLANEOUS) ×3 IMPLANT
COVER BACK TABLE 60X90IN (DRAPES) ×3 IMPLANT
COVER WAND RF STERILE (DRAPES) ×5 IMPLANT
DECANTER SPIKE VIAL GLASS SM (MISCELLANEOUS) ×3 IMPLANT
DERMABOND ADVANCED (GAUZE/BANDAGES/DRESSINGS) ×1
DERMABOND ADVANCED .7 DNX12 (GAUZE/BANDAGES/DRESSINGS) ×2 IMPLANT
DEVICE DISSECT PLASMABLAD 3.0S (MISCELLANEOUS) ×2 IMPLANT
DRAPE C-ARM 42X72 X-RAY (DRAPES) ×6 IMPLANT
DRAPE C-ARMOR (DRAPES) ×1 IMPLANT
DRAPE HALF SHEET 40X57 (DRAPES) ×1 IMPLANT
DRAPE LAPAROTOMY 100X72X124 (DRAPES) ×3 IMPLANT
DRAPE SHEET LG 3/4 BI-LAMINATE (DRAPES) ×3 IMPLANT
DRSG OPSITE POSTOP 4X10 (GAUZE/BANDAGES/DRESSINGS) ×1 IMPLANT
DRSG OPSITE POSTOP 4X6 (GAUZE/BANDAGES/DRESSINGS) ×1 IMPLANT
DURAPREP 26ML APPLICATOR (WOUND CARE) ×3 IMPLANT
DURASEAL APPLICATOR TIP (TIP) IMPLANT
DURASEAL SPINE SEALANT 3ML (MISCELLANEOUS) IMPLANT
ELECT BLADE 4.0 EZ CLEAN MEGAD (MISCELLANEOUS)
ELECT REM PT RETURN 9FT ADLT (ELECTROSURGICAL) ×3
ELECTRODE BLDE 4.0 EZ CLN MEGD (MISCELLANEOUS) IMPLANT
ELECTRODE REM PT RTRN 9FT ADLT (ELECTROSURGICAL) ×2 IMPLANT
GAUZE 4X4 16PLY RFD (DISPOSABLE) ×1 IMPLANT
GAUZE SPONGE 4X4 12PLY STRL (GAUZE/BANDAGES/DRESSINGS) ×3 IMPLANT
GLOVE BIOGEL PI IND STRL 6 (GLOVE) IMPLANT
GLOVE BIOGEL PI IND STRL 7.5 (GLOVE) IMPLANT
GLOVE BIOGEL PI IND STRL 8 (GLOVE) IMPLANT
GLOVE BIOGEL PI IND STRL 8.5 (GLOVE) ×4 IMPLANT
GLOVE BIOGEL PI INDICATOR 6 (GLOVE) ×1
GLOVE BIOGEL PI INDICATOR 7.5 (GLOVE) ×1
GLOVE BIOGEL PI INDICATOR 8 (GLOVE) ×1
GLOVE BIOGEL PI INDICATOR 8.5 (GLOVE) ×2
GLOVE ECLIPSE 8.5 STRL (GLOVE) ×6 IMPLANT
GLOVE ECLIPSE 9.0 STRL (GLOVE) ×1 IMPLANT
GLOVE SURG SS PI 6.5 STRL IVOR (GLOVE) ×2 IMPLANT
GLOVE SURG SS PI 7.0 STRL IVOR (GLOVE) ×4 IMPLANT
GOWN STRL REUS W/ TWL LRG LVL3 (GOWN DISPOSABLE) IMPLANT
GOWN STRL REUS W/ TWL XL LVL3 (GOWN DISPOSABLE) IMPLANT
GOWN STRL REUS W/TWL 2XL LVL3 (GOWN DISPOSABLE) ×7 IMPLANT
GOWN STRL REUS W/TWL LRG LVL3 (GOWN DISPOSABLE) ×3
GOWN STRL REUS W/TWL XL LVL3 (GOWN DISPOSABLE) ×9
GRAFT BNE CHIP CANC 1-8 40 (Bone Implant) IMPLANT
GUIDEWIRE NITINOL BEVEL TIP (WIRE) ×9 IMPLANT
HEMOSTAT POWDER KIT SURGIFOAM (HEMOSTASIS) ×2 IMPLANT
KIT BASIN OR (CUSTOM PROCEDURE TRAY) ×3 IMPLANT
KIT INFUSE MEDIUM (Orthopedic Implant) ×1 IMPLANT
KIT SPINE MAZOR X ROBO DISP (MISCELLANEOUS) ×3 IMPLANT
KIT TURNOVER KIT B (KITS) ×3 IMPLANT
MILL MEDIUM DISP (BLADE) ×2 IMPLANT
MIXER KYPHON (MISCELLANEOUS) ×1 IMPLANT
NDL RELINE-OR FENS 16 (NEEDLE) IMPLANT
NEEDLE HYPO 22GX1.5 SAFETY (NEEDLE) ×3 IMPLANT
NEEDLE RELINE-OR FENS 16 (NEEDLE) ×27 IMPLANT
NS IRRIG 1000ML POUR BTL (IV SOLUTION) ×4 IMPLANT
PACK LAMINECTOMY NEURO (CUSTOM PROCEDURE TRAY) ×3 IMPLANT
PAD ARMBOARD 7.5X6 YLW CONV (MISCELLANEOUS) ×9 IMPLANT
PATTIES SURGICAL .5 X1 (DISPOSABLE) ×3 IMPLANT
PIN HEAD 2.5X60MM (PIN) IMPLANT
PLASMABLADE 3.0S (MISCELLANEOUS) ×3
PUSHER RELINE FENS 36 OR (MISCELLANEOUS) ×9 IMPLANT
ROD RELINE-O 5.5X300 STRT NS (Rod) IMPLANT
ROD RELINE-O 5.5X300MM STRT (Rod) ×6 IMPLANT
SCREW LOCK RELINE 5.5 TULIP (Screw) ×9 IMPLANT
SCREW RELINE 2FC POLY 4.5X35 (Screw) ×4 IMPLANT
SCREW RELINE 4.5X40 2FC POLY (Screw) ×4 IMPLANT
SCREW RELINE PA 2FC 5.5X40 (Screw) ×1 IMPLANT
SCREW SCHANZ SA 4.0MM (MISCELLANEOUS) IMPLANT
SPONGE LAP 4X18 RFD (DISPOSABLE) IMPLANT
SPONGE SURGIFOAM ABS GEL 100 (HEMOSTASIS) ×2 IMPLANT
SUT PROLENE 6 0 BV (SUTURE) IMPLANT
SUT VIC AB 1 CT1 18XBRD ANBCTR (SUTURE) ×2 IMPLANT
SUT VIC AB 1 CT1 8-18 (SUTURE) ×6
SUT VIC AB 2-0 CP2 18 (SUTURE) ×4 IMPLANT
SUT VIC AB 3-0 SH 8-18 (SUTURE) ×7 IMPLANT
SYR 3ML LL SCALE MARK (SYRINGE) ×12 IMPLANT
TOWEL GREEN STERILE (TOWEL DISPOSABLE) ×3 IMPLANT
TOWEL GREEN STERILE FF (TOWEL DISPOSABLE) ×3 IMPLANT
TRAY FOLEY MTR SLVR 16FR STAT (SET/KITS/TRAYS/PACK) ×3 IMPLANT
TUBE MAZOR SA REDUCTION (TUBING) ×3 IMPLANT
WATER STERILE IRR 1000ML POUR (IV SOLUTION) ×3 IMPLANT

## 2019-02-23 NOTE — Anesthesia Procedure Notes (Signed)
Procedure Name: Intubation Date/Time: 02/23/2019 8:27 AM Performed by: Kathryne Hitch, CRNA Pre-anesthesia Checklist: Patient identified, Emergency Drugs available, Suction available, Patient being monitored and Timeout performed Patient Re-evaluated:Patient Re-evaluated prior to induction Oxygen Delivery Method: Circle system utilized Preoxygenation: Pre-oxygenation with 100% oxygen Induction Type: IV induction, Rapid sequence and Cricoid Pressure applied Laryngoscope Size: Miller and 2 Grade View: Grade I Tube type: Oral Tube size: 7.0 mm Airway Equipment and Method: Stylet Placement Confirmation: ETT inserted through vocal cords under direct vision,  positive ETCO2 and breath sounds checked- equal and bilateral Secured at: 21 cm Tube secured with: Tape Dental Injury: Teeth and Oropharynx as per pre-operative assessment

## 2019-02-23 NOTE — Transfer of Care (Signed)
Immediate Anesthesia Transfer of Care Note  Patient: Jamie [name]  Procedure(s) Performed: Thoracic ten to Lumbar two decompression with Posterior lumbar interbody fusion lumbar one-lumbar two with Osteotomy at Lumbar one-Lumbar two, Posterior arthrodesis Thoracic ten to Lumbar two with Mazor (N/A Back) APPLICATION OF ROBOTIC ASSISTANCE FOR SPINAL PROCEDURE (N/A )  Patient Location: PACU  Anesthesia Type:General  Level of Consciousness: drowsy and patient cooperative  Airway & Oxygen Therapy: Patient Spontanous Breathing and Patient connected to face mask oxygen  Post-op Assessment: Report given to RN and Post -op Vital signs reviewed and stable  Post vital signs: Reviewed and stable  Last Vitals:  Vitals Value Taken Time  BP 104/65 02/23/2019  2:44 PM  Temp    Pulse 61 02/23/2019  2:47 PM  Resp 10 02/23/2019  2:48 PM  SpO2 100 % 02/23/2019  2:47 PM  Vitals shown include unvalidated device data.  Last Pain:  Vitals:   02/23/19 0626  TempSrc:   PainSc: 6       Patients Stated Pain Goal: 3 (89/16/94 5038)  Complications: No apparent anesthesia complications

## 2019-02-23 NOTE — Op Note (Signed)
Date of surgery: 02/23/2019 Preoperative diagnosis: Lumbar kyphosis L1-L2 history of fusion L2-L4.  Chronic back pain. Postoperative diagnosis: Same Procedure: L1-L2 laminectomy and decompression of lumbar spine reduction of kyphosis with bilateral osteotomies and transforaminal interbody fusion with peek spacer local autograft and allograft.  Segmental fixation from T10-L4 with pedicle screws placed with robotic assistance and methacrylate augmentation from T10-L1 and stabilization from T10-L4.  Posterior lateral arthrodesis L1-L2 posterior arthrodesis T10-L1 with autograft allograft and infuse. Surgeon: Kristeen Miss First assistant: Deri Fuelling, MD Anesthesia: General endotracheal Indications: Jamie [name] is a 68 year old individuals had a previous decompression and fusion at L2-L4 for spondylolisthesis and stenosis.  She developed a junctional syndrome with an acute kyphosis at the level of L1-L2 this caused significant back pain and mild symptoms of a cauda equina syndrome.  She was advised regarding surgical decompression of the spinal canal by posterior approach and also reduction of her kyphosis with osteotomies.  Procedure: The patient was brought to the operating room supine on a stretcher.  After the smooth induction of general endotracheal anesthesia, she was turned prone.  The back was prepped with alcohol DuraPrep and draped in a sterile fashion.  The previously made lumbar midline incision was reopened and dissection was carried down to the lumbodorsal fascia the old hardware was easily identified because of the patient's thin body state.  Dissection was carried out over the lateral hardware and then the midline was dissected superiorly into the lower thoracic spine.  Subperiosteal dissection was taken out to the level of T10.  This area was packed off for later usage at L1-L2 laminectomy was performed decompressing the thecal sac dorsally lateral aspects were dissected until the disc space  could be identified but the space was then entered and a complete discectomy was performed at L1-L2 using a series of dilators to expose the endplates and decorticate the endplates adequately.  Once his decortication was completed the interspace was sized for an appropriately spies T lift spacer.  It was felt that an 8 mm tall banana plug would fit best into the ventral aspect to allow the dorsal aspect to be compressed.  This was filled with autograft allograft and infuse and approximately 3 cc of graft was placed into the interspace.  Once the graft was placed attention was turned to placing pedicle screws here the Mazor robot was attached to the spinous process of L1 and then registration radiographs were obtained using the Mazor robot were then able to place pedicle screws in T10-T11-T12 and L1 bilaterally.  K wires were placed and at the various levels a series of pedicle screws starting at T10 and T11 4.5 x 35 mm screws were placed at T12 and L1 4.5 x 40 mm screws were placed in on the left side at L2 a 5.5 x 40 mm screw was placed.  Once the screws were set in position the previously placed rods were removed and the left-sided L2 screw had been removed.  The rods were then set in position using 2 straight rods to connect from L4-T10.  These were set in a neutral construct with the kyphosis being reduced at the L1-L2 level.  This was done because osteotomies had been performed at L1-L2 removing the entirety of the facets to the inferior margin of the pedicles of L1 and the superior margin of the pedicles of L2.  The foramen was adequate Truman Hayward open for the L1 nerve root to pass.  The rods were set in position in a neutral construct  and final radiographs were obtained in AP and lateral projection.  Once the rods were torqued into position the lateral gutters at L1-L2 were decorticated and packed with autograft allograft and infuse the dorsal surface from T10-L1 was then packed with the remainder of autograft  allograft and infuse for a total volume of approximately 30 cc of bone graft.  With this the area was checked for hemostasis and the thoracodorsal lumbodorsal fascia was closed with #1 Vicryl in interrupted fashion 2-0 Vicryl was used in subcutaneous tissues and 3-0 Vicryl subcuticularly blood loss for the procedure was estimated 350 cc an 80 cc of Cell Saver blood was returned to the patient.  Patient was returned to recovery room in stable condition.

## 2019-02-23 NOTE — Progress Notes (Signed)
Placed onto a bed/repositioned and warming device/bair hugger appl;ied as pt staes she is cold/ currently unable to ascertain oral temp or rectal temp/temporal =97.0

## 2019-02-23 NOTE — Anesthesia Postprocedure Evaluation (Signed)
Anesthesia Post Note  Patient: BEONKA AMESQUITA  Procedure(s) Performed: Thoracic ten to Lumbar two decompression with Posterior lumbar interbody fusion lumbar one-lumbar two with Osteotomy at Lumbar one-Lumbar two, Posterior arthrodesis Thoracic ten to Lumbar two with Mazor (N/A Back) APPLICATION OF ROBOTIC ASSISTANCE FOR SPINAL PROCEDURE (N/A )     Patient location during evaluation: PACU Anesthesia Type: General Level of consciousness: awake and alert Pain management: pain level controlled Vital Signs Assessment: post-procedure vital signs reviewed and stable Respiratory status: spontaneous breathing, nonlabored ventilation, respiratory function stable and patient connected to nasal cannula oxygen Cardiovascular status: blood pressure returned to baseline and stable Postop Assessment: no apparent nausea or vomiting Anesthetic complications: no    Last Vitals:  Vitals:   02/23/19 1445 02/23/19 1500  BP: 104/65 (!) 80/50  Pulse: (!) 59   Resp: 11 11  Temp: 36.6 C   SpO2: 100%     Last Pain:  Vitals:   02/23/19 1445  TempSrc:   PainSc: Asleep                 Daxen Lanum A.

## 2019-02-23 NOTE — Anesthesia Procedure Notes (Signed)
Arterial Line Insertion Start/End5/09/2019 8:20 AM Performed by: Alain [place], CRNA  Patient location: Pre-op. Preanesthetic checklist: patient identified, IV checked, site marked, risks and benefits discussed, monitors and equipment checked and pre-op evaluation Lidocaine 1% used for infiltration Left was placed Catheter size: 20 G Maximum sterile barriers used   Attempts: 1 Procedure performed without using ultrasound guided technique. Following insertion, dressing applied and Biopatch. Post procedure assessment: normal  Patient tolerated the procedure well with no immediate complications.

## 2019-02-23 NOTE — OR Nursing (Signed)
Difficult insertion of foley catheter smaller size catheter required 14 french instead of 16 fr inserted ,red seal removed using sterile technique

## 2019-02-23 NOTE — H&P (Signed)
Jamie [name] is an 68 y.o. female.   Chief Complaint: Chronic back pain history of lumbar fusion HPI: Jamie [name] is a 68 year old individual who has had a previous lumbar fusion from L2-L4.  She had a severe stenosis at that time.  She recovered however over the last few years she is developed increasing back pain centrally in the midportion low back and at the thoracolumbar junction.  Follow-up films demonstrate that she has developed kyphosis at the L1-L2 level.  Having failed efforts at conservative management I discussed decompressing and eliminating her kyphosis with stabilization from T10 to the L2 level.  She is now fitted for that procedure.  Past Medical History:  Diagnosis Date  . Anxiety   . Breast cancer ([place])   . COPD (chronic obstructive pulmonary disease) ([place])   . Deaf   . Depression   . Diabetes ([place])   . HOH (hard of hearing)   . HTN (hypertension)   . Hyperlipidemia   . Personal history of radiation therapy   . Vitamin D deficiency     Past Surgical History:  Procedure Laterality Date  . BACK SURGERY    . BREAST SURGERY     radiation  . CARPAL TUNNEL RELEASE    . CERVICAL SPINE SURGERY    . EYE SURGERY Bilateral    cataracts  . GALLBLADDER SURGERY    . INNER EAR SURGERY    . SPINE SURGERY      Family History  Problem Relation Age of Onset  . Heart attack Mother   . Stroke Mother   . Stroke Sister   . Diabetes Other        sister x3, and mother  . Lung disease Father        worked in [employer]  . Colon cancer Neg Hx   . Colon polyps Neg Hx    Social History:  reports that she has been smoking cigarettes. She has a 51.00 pack-year smoking history. She has never used smokeless tobacco. She reports that she does not drink alcohol or use drugs.  Allergies:  Allergies  Allergen Reactions  . Lipitor [Atorvastatin] Other (See Comments)    Constipation   . Soma [Carisoprodol] Nausea And Vomiting    Medications Prior to Admission  Medication Sig  Dispense Refill  . amLODipine (NORVASC) 5 MG tablet Take 1 tablet (5 mg total) by mouth daily. 90 tablet 1  . Insulin Glargine (LANTUS SOLOSTAR) 100 UNIT/ML Solostar Pen Inject 20-25 Units into the skin daily. (Patient taking differently: Inject 22 Units into the skin every morning. ) 15 mL 3  . losartan (COZAAR) 100 MG tablet TAKE 1 TABLET EVERY DAY 90 tablet 1  . pravastatin (PRAVACHOL) 20 MG tablet TAKE 1 TABLET EVERY DAY 90 tablet 1  . ACCU-CHEK AVIVA PLUS test strip USE AS INSTRUCTED 100 each 11  . ACCU-CHEK FASTCLIX LANCETS MISC Use to check blood sugar two times daily  Dx Code E11.9 180 each 1  . albuterol (PROVENTIL) (2.5 MG/3ML) 0.083% nebulizer solution Take 3 mLs (2.5 mg total) by nebulization every 6 (six) hours as needed for wheezing or shortness of breath. (Patient not taking: Reported on 02/09/2019) 150 mL 1  . Blood Glucose Monitoring Suppl (ACCU-CHEK AVIVA PLUS) W/DEVICE KIT 1 Device by Does not apply route 2 (two) times daily. 1 kit 0  . budesonide-formoterol (SYMBICORT) 80-4.5 MCG/ACT inhaler Inhale 2 puffs into the lungs 2 (two) times daily. (Patient not taking: Reported on 02/09/2019) 1 Inhaler  3  . Insulin Pen Needle (PEN NEEDLES) 32G X 4 MM MISC 1 applicator by Does not apply route daily. 100 each 6    Results for orders placed or performed during the hospital encounter of 02/23/19 (from the past 48 hour(s))  Glucose, capillary     Status: Abnormal   Collection Time: 02/23/19  5:39 AM  Result Value Ref Range   Glucose-Capillary 161 (H) 70 - 99 mg/dL   No results found.  Review of Systems  Constitutional: Negative.   HENT: Negative.   Eyes: Negative.   Respiratory: Negative.   Cardiovascular: Negative.   Gastrointestinal: Negative.   Genitourinary: Negative.   Musculoskeletal: Positive for back pain.  Skin: Negative.   Neurological: Negative.   Endo/Heme/Allergies: Negative.   Psychiatric/Behavioral:       Anxiety    Blood pressure (!) 169/67, pulse 73,  temperature 98 F (36.7 C), temperature source Oral, resp. rate 18, height 5' (1.524 m), weight 43.5 kg, SpO2 100 %. Physical Exam  Constitutional: She is oriented to person, place, and time. She appears well-developed and well-nourished.  Slight build  HENT:  Head: Normocephalic and atraumatic.  Eyes: Pupils are equal, round, and reactive to light. Conjunctivae and EOM are normal.  Neck: Normal range of motion. Neck supple.  GI: Soft. Bowel sounds are normal.  Musculoskeletal:     Comments: Thoracolumbar kyphosis when standing in neutral position maintaining at 15 to 20 degree forward stoop  Neurological: She is alert and oriented to person, place, and time.  Absent deep tendon reflexes in the lower extremities normal strength in the upper extremities weakness in the quadriceps bilaterally at 4 out of 5.  Positive straight leg raising at 30 degrees in either lower extremity.  Skin: Skin is warm and dry.  Psychiatric: She has a normal mood and affect. Her behavior is normal. Judgment and thought content normal.     Assessment/Plan Pression: Kyphotic deformity at the L1-L2 level with significant chronic back pain. Plan: Thoracolumbar kyphectomy with stabilization from T10-L2  Earleen Newport, MD 02/23/2019, 7:41 AM

## 2019-02-24 LAB — CBC
HCT: 25.4 % — ABNORMAL LOW (ref 36.0–46.0)
Hemoglobin: 8.3 g/dL — ABNORMAL LOW (ref 12.0–15.0)
MCH: 27.9 pg (ref 26.0–34.0)
MCHC: 32.7 g/dL (ref 30.0–36.0)
MCV: 85.2 fL (ref 80.0–100.0)
Platelets: 124 10*3/uL — ABNORMAL LOW (ref 150–400)
RBC: 2.98 MIL/uL — ABNORMAL LOW (ref 3.87–5.11)
RDW: 14.2 % (ref 11.5–15.5)
WBC: 8.5 10*3/uL (ref 4.0–10.5)
nRBC: 0 % (ref 0.0–0.2)

## 2019-02-24 LAB — BASIC METABOLIC PANEL
Anion gap: 7 (ref 5–15)
BUN: 18 mg/dL (ref 8–23)
CO2: 22 mmol/L (ref 22–32)
Calcium: 8.5 mg/dL — ABNORMAL LOW (ref 8.9–10.3)
Chloride: 103 mmol/L (ref 98–111)
Creatinine, Ser: 0.91 mg/dL (ref 0.44–1.00)
GFR calc Af Amer: >60 mL/min (ref >60)
GFR calc non Af Amer: >60 mL/min (ref >60)
Glucose, Bld: 203 mg/dL — ABNORMAL HIGH (ref 70–99)
Potassium: 4.5 mmol/L (ref 3.5–5.1)
Sodium: 132 mmol/L — ABNORMAL LOW (ref 135–145)

## 2019-02-24 LAB — GLUCOSE, CAPILLARY
Glucose-Capillary: 169 mg/dL — ABNORMAL HIGH (ref 70–99)
Glucose-Capillary: 107 mg/dL — ABNORMAL HIGH (ref 70–99)

## 2019-02-24 MED FILL — Thrombin For Soln 5000 Unit: CUTANEOUS | Qty: 5000 | Status: AC

## 2019-02-24 MED FILL — Gelatin Absorbable MT Powder: OROMUCOSAL | Qty: 1 | Status: AC

## 2019-02-24 NOTE — Social Work (Signed)
CSW acknowledging consult for SNF placement. Will follow for therapy recommendations.   Kerissa Coia, MSW, LCSWA [facility] [phone]   

## 2019-02-24 NOTE — Progress Notes (Signed)
Patient ID: Jamie [name], female   DOB: 26-Feb-1951, 68 y.o.   MRN: [id number] Vital signs are stable Postoperative labs show hematocrit of 25 secondary to acute blood loss anemia from surgery Patient's blood sugars are slightly elevated serum sodium is 132 renal function is good Dressing is clean and dry Patient is ambulatory with minimal assistance Believe that we should observe her situation a bit further, she is eager to go home I am hopeful she would do well with home health physical therapy and Occupational Therapy We will observe in hospital today

## 2019-02-24 NOTE — Evaluation (Signed)
Physical Therapy Evaluation Patient Details Name: Jamie [name] MRN: [id number] DOB: 02-13-1951 Today's Date: 02/24/2019   History of Present Illness  Pt is a 68 y.o. F with significant PMH of prior lumbar fusion, COPD, DM, HOH who presents s/p thoracolumbar kyphectomy with stabilization from T10-L2  Clinical Impression  Pt admitted with above diagnosis. Pt currently with functional limitations due to the deficits listed below (see PT Problem List). Pt presenting with decreased functional mobility secondary to abnormal posture, balance impairments, decreased activity tolerance, and decreased safety awareness. Pt ambulating x 150 feet with no assistive device and min guard assist. Negotiated 3 steps with right railing to simulate home environment. Recommended walker for mobility for increased independence and safety. Pt difficult to communicate with as she is very HOH; attempted use of visual cueing for spinal precautions (provided written handout) but pt needs max reinforcement to maintain. Pt will benefit from skilled PT to increase their independence and safety with mobility to allow discharge to the venue listed below.       Follow Up Recommendations Home health PT;Supervision/Assistance - 24 hour    Equipment Recommendations  None recommended by PT (has walker)   Recommendations for Other Services       Precautions / Restrictions Precautions Precautions: Fall;Back Precaution Booklet Issued: Yes (comment) Required Braces or Orthoses: Spinal Brace Spinal Brace: Lumbar corset;Applied in sitting position Restrictions Weight Bearing Restrictions: No      Mobility  Bed Mobility Overal bed mobility: Modified Independent             General bed mobility comments: Cues for log roll technique  Transfers Overall transfer level: Needs assistance Equipment used: None Transfers: Sit to/from Stand Sit to Stand: Min guard            Ambulation/Gait Ambulation/Gait  assistance: Min guard Gait Distance (Feet): 150 Feet Assistive device: None Gait Pattern/deviations: Step-through pattern;Antalgic;Trunk flexed;Decreased stride length Gait velocity: decr   General Gait Details: Pt with increasingly antalgic gait pattern in addition to kyphotic posture with right lateral lean with fatigue, reaching for railing for external support  Stairs Stairs: Yes Stairs assistance: Min guard Stair Management: One rail Right Number of Stairs: 3 General stair comments: Use of sideways technique for ascending, step by step pattern  Wheelchair Mobility    Modified Rankin (Stroke Patients Only)       Balance Overall balance assessment: Needs assistance Sitting-balance support: Feet supported Sitting balance-Leahy Scale: Good     Standing balance support: No upper extremity supported;During functional activity Standing balance-Leahy Scale: Fair                               Pertinent Vitals/Pain Pain Assessment: Faces Faces Pain Scale: Hurts little more Pain Location: surgical site Pain Descriptors / Indicators: Operative site guarding Pain Intervention(s): Monitored during session    Home Living Family/patient expects to be discharged to:: Private residence Living Arrangements: Spouse/significant other Available Help at Discharge: Family Type of Home: House Home Access: Stairs to enter   Technical brewer of Steps: 3 Home Layout: One level Home Equipment: Environmental consultant - 2 wheels      Prior Function Level of Independence: Independent               Hand Dominance        Extremity/Trunk Assessment   Upper Extremity Assessment Upper Extremity Assessment: Defer to OT evaluation    Lower Extremity Assessment Lower Extremity Assessment: Generalized weakness  Cervical / Trunk Assessment Cervical / Trunk Assessment: Kyphotic  Communication   Communication: No difficulties  Cognition Arousal/Alertness:  Awake/alert Behavior During Therapy: Impulsive Overall Cognitive Status: Impaired/Different from baseline Area of Impairment: Attention;Following commands;Safety/judgement;Awareness;Problem solving                   Current Attention Level: Sustained   Following Commands: Follows one step commands inconsistently(difficult due to [facility]) Safety/Judgement: Decreased awareness of safety Awareness: Emergent Problem Solving: Requires verbal cues;Requires tactile cues General Comments: Likely baseline. Pt very HOH combined with has difficulty attending to visual cueing for safety       General Comments      Exercises     Assessment/Plan    PT Assessment Patient needs continued PT services  PT Problem List Decreased strength;Decreased activity tolerance;Decreased balance;Decreased mobility;Decreased safety awareness;Pain       PT Treatment Interventions DME instruction;Gait training;Stair training;Functional mobility training;Therapeutic activities;Therapeutic exercise;Balance training;Patient/family education    PT Goals (Current goals can be found in the Care Plan section)  Acute Rehab PT Goals Patient Stated Goal: "fix my posture." PT Goal Formulation: With patient Time For Goal Achievement: 03/10/19 Potential to Achieve Goals: Good    Frequency Min 5X/week   Barriers to discharge        Co-evaluation               AM-PAC PT "6 Clicks" Mobility  Outcome Measure Help needed turning from your back to your side while in a flat bed without using bedrails?: None Help needed moving from lying on your back to sitting on the side of a flat bed without using bedrails?: None Help needed moving to and from a bed to a chair (including a wheelchair)?: A Little Help needed standing up from a chair using your arms (e.g., wheelchair or bedside chair)?: A Little Help needed to walk in hospital room?: A Little Help needed climbing 3-5 steps with a railing? : A Little 6 Click  Score: 20    End of Session Equipment Utilized During Treatment: Back brace Activity Tolerance: Patient tolerated treatment well Patient left: with call bell/phone within reach;in bed Nurse Communication: Mobility status PT Visit Diagnosis: Unsteadiness on feet (R26.81);Other abnormalities of gait and mobility (R26.89)    Time: 1517-6160 PT Time Calculation (min) (ACUTE ONLY): 24 min   Charges:   PT Evaluation $PT Eval Moderate Complexity: 1 Mod PT Treatments $Therapeutic Activity: 8-22 mins       Ellamae Sia, PT, DPT Acute Rehabilitation Services Pager [phone] Office [phone]   Willy Eddy 02/24/2019, 9:39 AM

## 2019-02-24 NOTE — Evaluation (Signed)
Occupational Therapy Evaluation Patient Details Name: Jamie [name] MRN: [id number] DOB: 07-Jun-1951 Today's Date: 02/24/2019    History of Present Illness Pt is a 68 y.o. F with significant PMH of prior lumbar fusion, COPD, DM, HOH who presents s/p thoracolumbar kyphectomy with stabilization from T10-L2   Clinical Impression   Pt admitted for above, reports independent with ADL/mobility prior to admission.  Limited by precaution adherence, decreased activity tolerance, and safety awareness.  Patient educated on brace mgmt and wear schedule, precautions, ADL compensatory techniques and recommendations. Initally standing without brace on and requires cueing to don (reports "I forgot"), she requires max cueing to adhere to back precautions throughout session with poor carryover.  Due to [facility], utilized handout to review precautions.  Patient requires min guard to close supervision for transfers, mobility and LB ADLs.  She will benefit from continued OT services while admitted and after dc at [facility] level in order to maximize independence and safety with ADLs.  Will follow.     Follow Up Recommendations  Supervision/Assistance - 24 hour;Home health OT    Equipment Recommendations  3 in 1 bedside commode    Recommendations for Other Services       Precautions / Restrictions Precautions Precautions: Fall;Back Precaution Booklet Issued: Yes (comment) Precaution Comments: requiers max cueing to adhere to precautions, thoroughly reviewed with poor carryover  Required Braces or Orthoses: Spinal Brace Spinal Brace: Lumbar corset;Applied in sitting position Restrictions Weight Bearing Restrictions: No      Mobility Bed Mobility               General bed mobility comments: OOB upon entry   Transfers Overall transfer level: Needs assistance Equipment used: None Transfers: Sit to/from Stand Sit to Stand: Supervision         General transfer comment: close supervision for transfers,  cueing for precaution adherance     Balance Overall balance assessment: Needs assistance Sitting-balance support: Feet supported Sitting balance-Leahy Scale: Good     Standing balance support: No upper extremity supported;During functional activity Standing balance-Leahy Scale: Fair                             ADL either performed or assessed with clinical judgement   ADL Overall ADL's : Needs assistance/impaired     Grooming: Supervision/safety;Standing   Upper Body Bathing: Set up;Sitting   Lower Body Bathing: Sit to/from stand;Min guard   Upper Body Dressing : Set up;Sitting   Lower Body Dressing: Min guard;Sit to/from stand   Toilet Transfer: Supervision/safety;Ambulation;Regular Toilet   Toileting- Water quality scientist and Hygiene: Supervision/safety;Sit to/from stand       Functional mobility during ADLs: Supervision/safety;Cueing for safety General ADL Comments: pt limited by adherance to precautions      Vision         Perception     Praxis      Pertinent Vitals/Pain Pain Assessment: Faces Faces Pain Scale: Hurts a little bit Pain Location: surgical site Pain Descriptors / Indicators: Operative site guarding Pain Intervention(s): Monitored during session     Hand Dominance Right   Extremity/Trunk Assessment Upper Extremity Assessment Upper Extremity Assessment: Generalized weakness   Lower Extremity Assessment Lower Extremity Assessment: Defer to PT evaluation   Cervical / Trunk Assessment Cervical / Trunk Assessment: Kyphotic   Communication Communication Communication: No difficulties   Cognition Arousal/Alertness: Awake/alert Behavior During Therapy: Impulsive Overall Cognitive Status: Difficult to assess Area of Impairment: Attention;Following commands;Safety/judgement;Awareness;Problem solving;Memory  Current Attention Level: Sustained Memory: Decreased short-term memory;Decreased recall of  precautions Following Commands: Follows one step commands inconsistently Safety/Judgement: Decreased awareness of safety Awareness: Emergent Problem Solving: Requires verbal cues;Requires tactile cues General Comments: likely baseline cognition, HOH and difficult to assess; poor attention, decreased safety awareness and adherance to precautions; required cueing to utilize brace   General Comments       Exercises     Shoulder Instructions      Home Living Family/patient expects to be discharged to:: Private residence Living Arrangements: Spouse/significant other Available Help at Discharge: Family Type of Home: House Home Access: Stairs to enter Technical brewer of Steps: 3   Home Layout: One level     Bathroom Shower/Tub: Teacher, early years/pre: Standard     Home Equipment: Environmental consultant - 2 wheels          Prior Functioning/Environment Level of Independence: Independent        Comments: reports basin bathing only         OT Problem List: Decreased activity tolerance;Decreased cognition;Decreased safety awareness;Decreased knowledge of use of DME or AE;Decreased knowledge of precautions;Pain      OT Treatment/Interventions: Self-care/ADL training;DME and/or AE instruction;Therapeutic activities;Patient/family education    OT Goals(Current goals can be found in the care plan section) Acute Rehab OT Goals Patient Stated Goal: to go for a walk OT Goal Formulation: With patient Time For Goal Achievement: 03/10/19 Potential to Achieve Goals: Good  OT Frequency: Min 2X/week   Barriers to D/C:            Co-evaluation              AM-PAC OT "6 Clicks" Daily Activity     Outcome Measure Help from another person eating meals?: None Help from another person taking care of personal grooming?: None Help from another person toileting, which includes using toliet, bedpan, or urinal?: A Little Help from another person bathing (including washing,  rinsing, drying)?: A Little Help from another person to put on and taking off regular upper body clothing?: None Help from another person to put on and taking off regular lower body clothing?: A Little 6 Click Score: 21   End of Session Equipment Utilized During Treatment: Back brace Nurse Communication: Mobility status  Activity Tolerance: Patient tolerated treatment well Patient left: Other (comment);with call bell/phone within reach(EOB )  OT Visit Diagnosis: Other abnormalities of gait and mobility (R26.89);Pain Pain - part of body: (back)                Time: 6314-9702 OT Time Calculation (min): 23 min Charges:  OT General Charges $OT Visit: 1 Visit OT Evaluation $OT Eval Moderate Complexity: [place], OT Acute Rehabilitation Services Pager [phone] Office [phone]   Delight Stare 02/24/2019, 4:30 PM

## 2019-02-25 ENCOUNTER — Encounter (HOSPITAL_COMMUNITY): Payer: Self-pay | Admitting: Neurological Surgery

## 2019-02-25 LAB — GLUCOSE, CAPILLARY
Glucose-Capillary: 62 mg/dL — ABNORMAL LOW (ref 70–99)
Glucose-Capillary: 108 mg/dL — ABNORMAL HIGH (ref 70–99)
Glucose-Capillary: 117 mg/dL — ABNORMAL HIGH (ref 70–99)
Glucose-Capillary: 128 mg/dL — ABNORMAL HIGH (ref 70–99)
Glucose-Capillary: 101 mg/dL — ABNORMAL HIGH (ref 70–99)

## 2019-02-25 MED ORDER — METHOCARBAMOL 500 MG PO TABS: 500.0000 mg | ORAL_TABLET | Freq: Four times a day (QID) | ORAL | 3 refills | Status: DC | PRN

## 2019-02-25 MED ORDER — OXYCODONE-ACETAMINOPHEN 5-325 MG PO TABS: 1.0000 | ORAL_TABLET | ORAL | 0 refills | Status: DC | PRN

## 2019-02-25 MED FILL — Heparin Sodium (Porcine) Inj 1000 Unit/ML: INTRAMUSCULAR | Qty: 30 | Status: AC

## 2019-02-25 MED FILL — Sodium Chloride IV Soln 0.9%: INTRAVENOUS | Qty: 1000 | Status: AC

## 2019-02-25 NOTE — Discharge Summary (Signed)
Physician Discharge Summary  Patient ID: Jamie [name] MRN: [id number] DOB/AGE: Jamie [name] 68 y.o.  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  She tolerated surgery well.  Postoperative hematocrit was noted to be 25 with a preoperative hematocrit of 40 consistent with acute blood loss anemia.  She did not require transfusion.  Consults: None  Significant Diagnostic Studies: None  Treatments: surgery: Decompression of L1-L2 with reduction of kyphosis fixation from T10-L2  Discharge Exam: Blood pressure 134/76, pulse (!) 106, temperature 99.8 F (37.7 C), temperature source Oral, resp. rate 18, height 5' (1.524 m), weight 43.5 kg, SpO2 93 %. Incision is clean and dry Station and gait are intact posture is much improved with 20 degree reduction in kyphosis  Disposition: Discharge disposition: 01-Home or Self Care       Discharge Instructions    Call MD for:  redness, tenderness, or signs of infection (pain, swelling, redness, odor or green/yellow discharge around incision site)   Complete by:  As directed    Call MD for:  severe uncontrolled pain   Complete by:  As directed    Call MD for:  temperature >100.4   Complete by:  As directed    Diet - low sodium heart healthy   Complete by:  As directed    Incentive spirometry RT   Complete by:  As directed    Increase activity slowly   Complete by:  As directed      Allergies as of 02/25/2019      Reactions   Lipitor [atorvastatin] Other (See Comments)   Constipation    Soma [carisoprodol] Nausea And Vomiting      Medication List     TAKE these medications   Accu-Chek Aviva Plus test strip Generic drug:  glucose blood USE AS INSTRUCTED   Accu-Chek Aviva Plus w/Device Kit 1 Device by Does not apply route 2 (two) times daily.   Accu-Chek FastClix Lancets Misc Use to check blood sugar two times daily  Dx Code E11.9   albuterol (2.5 MG/3ML) 0.083% nebulizer solution Commonly known as:  PROVENTIL Take 3 mLs (2.5 mg total) by nebulization every 6 (six) hours as needed for wheezing or shortness of breath.   amLODipine 5 MG tablet Commonly known as:  NORVASC Take 1 tablet (5 mg total) by mouth daily.   budesonide-formoterol 80-4.5 MCG/ACT inhaler Commonly known as:  SYMBICORT Inhale 2 puffs into the lungs 2 (two) times daily.   Insulin Glargine 100 UNIT/ML Solostar Pen Commonly known as:  Lantus SoloStar Inject 20-25 Units into the skin daily. What changed:    how much to take  when to take this   losartan 100 MG tablet Commonly known as:  COZAAR TAKE 1 TABLET EVERY DAY   methocarbamol 500 MG tablet Commonly known as:  ROBAXIN Take 1 tablet (500 mg total) by mouth every 6 (six) hours as needed for muscle spasms.   oxyCODONE-acetaminophen 5-325 MG tablet Commonly known as:  PERCOCET/ROXICET Take 1-2 tablets by mouth every 3 (three) hours as needed for moderate pain or severe pain.   Pen Needles 32G X 4 MM Misc 1 applicator by Does not apply route daily.   pravastatin  20 MG tablet Commonly known as:  PRAVACHOL TAKE 1 TABLET EVERY DAY            Durable Medical Equipment  (From admission, onward)         Start     Ordered   02/25/19 1108  DME 3-in-1  Once     02/25/19 1108           Signed: Blanchie Dessert Danniell Rotundo 02/25/2019, 11:09 AM

## 2019-02-25 NOTE — Progress Notes (Signed)
Patient ID: Jamie [name], female   DOB: 1951-08-29, 68 y.o.   MRN: [id number] Vital signs are stable Patient feels ready to go home Pain medication requirements have been minimal We will write for home health needs

## 2019-02-25 NOTE — Progress Notes (Signed)
Physical Therapy Treatment Patient Details Name: Jamie [name] MRN: [id number] DOB: 11-Apr-1951 Today's Date: 02/25/2019    History of Present Illness Pt is a 68 y.o. F with significant PMH of prior lumbar fusion, COPD, DM, HOH who presents s/p thoracolumbar kyphectomy with stabilization from T10-L2    PT Comments     Pt performing functional mobility at a supervision level. Ambulating 100 feet x 2 with no assistive device. Continues with decreased activity tolerance, SpO2 92% on RA. Use of maximal visual cueing to educate pt on spinal precautions and brace use. Due to pt being [facility] combined with decreased attention, difficult to reinforce precautions. D/c plan remains appropriate.    Follow Up Recommendations  Home health PT;Supervision/Assistance - 24 hour     Equipment Recommendations  None recommended by PT    Recommendations for Other Services       Precautions / Restrictions Precautions Precautions: Fall;Back Precaution Booklet Issued: Yes (comment) Precaution Comments: Pt requires mod  cues to adhere to back precautions.  When she is redirected to safer method for performing task, she becomes very anxious stating " I HAVE to bend over "  Required Braces or Orthoses: Spinal Brace Spinal Brace: Lumbar corset;Applied in sitting position Restrictions Weight Bearing Restrictions: No    Mobility  Bed Mobility Overal bed mobility: Modified Independent                Transfers Overall transfer level: Needs assistance Equipment used: None Transfers: Sit to/from Stand Sit to Stand: Supervision            Ambulation/Gait Ambulation/Gait assistance: Supervision Gait Distance (Feet): 100 Feet(x2) Assistive device: None Gait Pattern/deviations: Step-through pattern;Antalgic;Trunk flexed;Decreased stride length Gait velocity: decr   General Gait Details: Pt ambulating 100 ft x 2 with one standing rest break, DOE 2/4. Supervision for safety   Stairs              Wheelchair Mobility    Modified Rankin (Stroke Patients Only)       Balance Overall balance assessment: Needs assistance Sitting-balance support: Feet supported Sitting balance-Leahy Scale: Good     Standing balance support: No upper extremity supported;During functional activity Standing balance-Leahy Scale: Fair                              Cognition Arousal/Alertness: Awake/alert Behavior During Therapy: Impulsive Overall Cognitive Status: Impaired/Different from baseline Area of Impairment: Attention;Following commands;Safety/judgement;Awareness;Problem solving                   Current Attention Level: Sustained Memory: Decreased short-term memory Following Commands: Follows one step commands inconsistently(difficult due to [facility]) Safety/Judgement: Decreased awareness of safety Awareness: Emergent Problem Solving: Requires verbal cues;Requires tactile cues General Comments: Poor attention and adherence to precautions      Exercises      General Comments        Pertinent Vitals/Pain Pain Assessment: Faces Faces Pain Scale: Hurts little more Pain Location: bilateral hips Pain Descriptors / Indicators: Sore Pain Intervention(s): Monitored during session    Home Living                      Prior Function            PT Goals (current goals can now be found in the care plan section) Acute Rehab PT Goals Patient Stated Goal: "fix my posture." PT Goal Formulation: With patient Time For Goal Achievement: 03/10/19  Potential to Achieve Goals: Good Progress towards PT goals: Progressing toward goals    Frequency    Min 5X/week      PT Plan Current plan remains appropriate    Co-evaluation              AM-PAC PT "6 Clicks" Mobility   Outcome Measure  Help needed turning from your back to your side while in a flat bed without using bedrails?: None Help needed moving from lying on your back to sitting on the  side of a flat bed without using bedrails?: None Help needed moving to and from a bed to a chair (including a wheelchair)?: A Little Help needed standing up from a chair using your arms (e.g., wheelchair or bedside chair)?: A Little Help needed to walk in hospital room?: A Little Help needed climbing 3-5 steps with a railing? : A Little 6 Click Score: 20    End of Session Equipment Utilized During Treatment: Back brace Activity Tolerance: Patient tolerated treatment well Patient left: with call bell/phone within reach;in bed Nurse Communication: Mobility status PT Visit Diagnosis: Unsteadiness on feet (R26.81);Other abnormalities of gait and mobility (R26.89)     Time: 6381-7711 PT Time Calculation (min) (ACUTE ONLY): 15 min  Charges:  $Therapeutic Activity: 8-22 mins                     Ellamae Sia, PT, DPT Acute Rehabilitation Services Pager [phone] Office [phone]    Willy Eddy 02/25/2019, 12:28 PM

## 2019-02-25 NOTE — Progress Notes (Signed)
IV removed, discharge instructions reviewed with patient.  Pt transported via wheelchair home with husband.

## 2019-02-25 NOTE — TOC Transition Note (Signed)
Transition of Care [facility]) - CM/SW Discharge Note   Patient Details  Name: Jamie [name] MRN: [id number] Date of Birth: [birth year]  Transition of Care [facility] CM/SW Contact:  Ella Bodo, RN Phone Number: 02/25/2019, 3:28 PM   Clinical Narrative:  Pt is a 68 y.o. F with significant PMH of prior lumbar fusion, COPD, DM, HOH who presents s/p thoracolumbar kyphectomy with stabilization from T10-L2.  PTA, pt independent, lives at home with spouse.  She states husband works, but sister will assist her at home as needed.  PT/OT recommending HH follow up, and pt agreeable to services.  Referral to [facility], per pt choice; start of care 02/26/19.  No DME needed, per therapy.       Final next level of care: [place] Barriers to Discharge: No Barriers Identified   Patient Goals and CMS Choice Patient states their goals for this hospitalization and ongoing recovery are:: to get out of here CMS Medicare.gov Compare Post Acute Care list provided to:: Patient Choice offered to / list presented to : Patient                        Discharge Plan and Services   Discharge Planning Services: CM Consult Post Acute Care Choice: Home Health                    HH Arranged: PT, OT [facility] Agency: [facility] Date [place]: 02/25/19 Time [place]: 1111 Representative spoke with at [place]: Glyn Ade    Readmission Risk Interventions No flowsheet data found.  Reinaldo Raddle, RN, BSN  Trauma/Neuro ICU Case Manager [phone]

## 2019-02-25 NOTE — Progress Notes (Signed)
Occupational Therapy Treatment Patient Details Name: Jamie [name] MRN: [id number] DOB: 1950-11-09 Today's Date: 02/25/2019    History of present illness Pt is a 68 y.o. F with significant PMH of prior lumbar fusion, COPD, DM, HOH who presents s/p thoracolumbar kyphectomy with stabilization from T10-L2   OT comments  Pt able to perform ADLs with supervision, but requires mod cues to adhere to back precautions.  She has very poor awareness of precautions and need to comply with them.  She is very HOH and very distractible, which impedes her ability to carry over information.   She will require repetition in the context of function for best success of carry over.  She was provided with reacher and LH sponge.    Follow Up Recommendations  Supervision/Assistance - 24 hour;Home health OT    Equipment Recommendations  None recommended by OT(Pt has 3in1 at home )    Recommendations for Other Services      Precautions / Restrictions Precautions Precautions: Fall;Back Precaution Booklet Issued: Yes (comment) Precaution Comments: Pt requires mod  cues to adhere to back precautions.  When she is redirected to safer method for performing task, she becomes very anxious stating " I HAVE to bend over "  Required Braces or Orthoses: Spinal Brace Spinal Brace: Lumbar corset;Applied in sitting position Restrictions Weight Bearing Restrictions: No       Mobility Bed Mobility Overal bed mobility: Modified Independent                Transfers Overall transfer level: Needs assistance Equipment used: None Transfers: Sit to/from Stand Sit to Stand: Supervision         General transfer comment: Pt provided with LH sponge and reacher     Balance Overall balance assessment: Needs assistance Sitting-balance support: Feet supported Sitting balance-Leahy Scale: Good     Standing balance support: No upper extremity supported;During functional activity Standing balance-Leahy Scale: Fair                              ADL either performed or assessed with clinical judgement   ADL Overall ADL's : Needs assistance/impaired Eating/Feeding: Independent   Grooming: Supervision/safety;Standing         Lower Body Bathing Details (indicate cue type and reason): Pt reports she takes tub baths because she will get ear infections.  Instructed her, via written instruction, that she is not to take tub baths, and instead should sponge bathe.   Instructed her in use of LH sponge.  She was able to return demonstation        Lower Body Dressing Details (indicate cue type and reason): Pt is able to perform figure 4. She does tend to bend a bit too much at times, and is difficult to redirect.   She repeatedly atempts to reach to the floor to retrieve shoes and other items.  Provided her with a reacher and instructed her how to use it to retrieve items.  She attempts to bend forward to adjust shoe, instructed her to cross ankles over knees.  She states she will try to remember info.  Toilet Transfer: Supervision/safety;Ambulation;Regular Toilet   Toileting- Water quality scientist and Hygiene: Supervision/safety;Sit to/from stand               Vision       Perception     Praxis      Cognition Arousal/Alertness: Awake/alert Behavior During Therapy: Impulsive;Anxious Overall Cognitive Status: No family/caregiver present  to determine baseline cognitive functioning Area of Impairment: Attention;Following commands;Safety/judgement;Awareness;Problem solving                   Current Attention Level: Sustained Memory: Decreased short-term memory Following Commands: Follows one step commands inconsistently(difficult due to [facility]) Safety/Judgement: Decreased awareness of safety Awareness: Emergent Problem Solving: Requires verbal cues;Requires tactile cues General Comments: Pt very HOH.  I resorted to writing down instructions and info resulting in better pt  participation.   She, however, is highly distractable, and will walk away from OT mid activity.  She frequently attempts to bend forward.  Redirected her on how to safely retrieve items and how to use AE. She becomes very anxious, and will do as asked, but does not carry over the info consistently          Exercises     Shoulder Instructions       General Comments Pt able to don brace with supervision     Pertinent Vitals/ Pain       Pain Assessment: Faces Faces Pain Scale: Hurts a little bit Pain Location: back  Pain Descriptors / Indicators: Sore;Operative site guarding Pain Intervention(s): Monitored during session  Home Living                                          Prior Functioning/Environment              Frequency  Min 2X/week        Progress Toward Goals  OT Goals(current goals can now be found in the care plan section)  Progress towards OT goals: Progressing toward goals  Acute Rehab OT Goals Patient Stated Goal: "fix my posture."  Plan Discharge plan remains appropriate;Equipment recommendations need to be updated    Co-evaluation                 AM-PAC OT "6 Clicks" Daily Activity     Outcome Measure   Help from another person eating meals?: None Help from another person taking care of personal grooming?: None Help from another person toileting, which includes using toliet, bedpan, or urinal?: A Little Help from another person bathing (including washing, rinsing, drying)?: A Little Help from another person to put on and taking off regular upper body clothing?: None Help from another person to put on and taking off regular lower body clothing?: A Little 6 Click Score: 21    End of Session Equipment Utilized During Treatment: Back brace  OT Visit Diagnosis: Pain Pain - part of body: (back )   Activity Tolerance Patient tolerated treatment well   Patient Left     Nurse Communication Mobility status         Time: 2620-3559 OT Time Calculation (min): 31 min  Charges: OT General Charges $OT Visit: 1 Visit OT Treatments $Self Care/Home Management : 23-37 mins  Lucille Passy, OTR/L Acute Rehabilitation Services Pager [phone] Office [phone]    Lucille Passy M 02/25/2019, 3:24 PM

## 2019-02-26 ENCOUNTER — Other Ambulatory Visit: Payer: Self-pay

## 2019-02-26 ENCOUNTER — Emergency Department (HOSPITAL_COMMUNITY)
Admission: EM | Admit: 2019-02-26 | Discharge: 2019-02-26 | Disposition: A | Discharge status: Home / Self Care | Payer: Medicare HMO | Attending: Emergency Medicine | Admitting: Emergency Medicine

## 2019-02-26 ENCOUNTER — Encounter (HOSPITAL_COMMUNITY): Payer: Self-pay | Admitting: Emergency Medicine

## 2019-02-26 DIAGNOSIS — Z79899 Other long term (current) drug therapy: Secondary | ICD-10-CM | POA: Insufficient documentation

## 2019-02-26 DIAGNOSIS — F17210 Nicotine dependence, cigarettes, uncomplicated: Secondary | ICD-10-CM | POA: Diagnosis not present

## 2019-02-26 DIAGNOSIS — R339 Retention of urine, unspecified: Secondary | ICD-10-CM | POA: Diagnosis not present

## 2019-02-26 DIAGNOSIS — M5416 Radiculopathy, lumbar region: Secondary | ICD-10-CM | POA: Diagnosis not present

## 2019-02-26 DIAGNOSIS — E119 Type 2 diabetes mellitus without complications: Secondary | ICD-10-CM | POA: Diagnosis not present

## 2019-02-26 DIAGNOSIS — Z794 Long term (current) use of insulin: Secondary | ICD-10-CM | POA: Insufficient documentation

## 2019-02-26 DIAGNOSIS — J449 Chronic obstructive pulmonary disease, unspecified: Secondary | ICD-10-CM | POA: Insufficient documentation

## 2019-02-26 DIAGNOSIS — F419 Anxiety disorder, unspecified: Secondary | ICD-10-CM | POA: Diagnosis not present

## 2019-02-26 DIAGNOSIS — F329 Major depressive disorder, single episode, unspecified: Secondary | ICD-10-CM | POA: Diagnosis not present

## 2019-02-26 DIAGNOSIS — E43 Unspecified severe protein-calorie malnutrition: Secondary | ICD-10-CM | POA: Diagnosis not present

## 2019-02-26 DIAGNOSIS — Z4789 Encounter for other orthopedic aftercare: Secondary | ICD-10-CM | POA: Diagnosis not present

## 2019-02-26 DIAGNOSIS — E1169 Type 2 diabetes mellitus with other specified complication: Secondary | ICD-10-CM | POA: Diagnosis not present

## 2019-02-26 DIAGNOSIS — I10 Essential (primary) hypertension: Secondary | ICD-10-CM | POA: Diagnosis not present

## 2019-02-26 DIAGNOSIS — G8929 Other chronic pain: Secondary | ICD-10-CM | POA: Diagnosis not present

## 2019-02-26 LAB — URINALYSIS, ROUTINE W REFLEX MICROSCOPIC
Bilirubin Urine: NEGATIVE
Glucose, UA: 50 mg/dL — AB
Ketones, ur: 5 mg/dL — AB
Leukocytes,Ua: NEGATIVE
Nitrite: NEGATIVE
Protein, ur: NEGATIVE mg/dL
Specific Gravity, Urine: 1.012 (ref 1.005–1.030)
pH: 6 (ref 5.0–8.0)

## 2019-02-26 NOTE — ED Provider Notes (Signed)
[facility] EMERGENCY DEPARTMENT Provider Note   CSN: [id number] Arrival date & time: 02/26/19  1340    History   Chief Complaint Chief Complaint  Patient presents with  . Urinary Retention    HPI Jamie [name] is a 68 y.o. female.     HPI  The patient is a 68 year old female, she has a history of COPD, she is hard of hearing and has been since birth, she has a history of diabetes and a recent history of back surgery.  She had been admitted to the hospital on May 12 and discharged yesterday after having stenosis at L1 and L2 and a history of fusion L2-L4.  She had surgical decompression and stabilization with fixation of T10.  She did very well with the surgery and was discharged yesterday.  There was no blood transfusions that were needed.  She did have a Foley catheter in the hospital and when it was taken out she was able to urinate and that she was at home.  Over the last 24 hours since going home she has not been able to urinate and has a feeling that she needs to but nothing will come out.  There is been a progressive lower abdominal discomfort but no fevers chills nausea or vomiting.  Past Medical History:  Diagnosis Date  . Anxiety   . Breast cancer ([place])   . COPD (chronic obstructive pulmonary disease) ([place])   . Deaf   . Depression   . Diabetes ([place])   . HOH (hard of hearing)   . HTN (hypertension)   . Hyperlipidemia   . Personal history of radiation therapy   . Vitamin D deficiency     Patient Active Problem List   Diagnosis Date Noted  . Postural kyphosis of lumbar region 02/23/2019  . Protein-calorie malnutrition, severe 09/09/2018  . Sepsis ([place]) 09/09/2018  . Lobar pneumonia ([place]) 09/09/2018  . Diabetes mellitus due to underlying condition, uncontrolled, with hyperglycemia, without long-term current use of insulin ([place]) 09/09/2018  . COPD with acute exacerbation ([place]) 09/09/2018  . Tobacco abuse counseling 09/09/2018  . Community acquired pneumonia  09/08/2018  . Lumbar radiculopathy, chronic 02/03/2017  . Diabetes mellitus with complication ([place])   . Chronic back pain 10/03/2016  . Underweight 05/02/2016  . GERD (gastroesophageal reflux disease) 02/13/2015  . Hard of hearing 02/10/2014  . Hyperlipidemia associated with type 2 diabetes mellitus ([place])   . Vitamin D deficiency   . Coronary artery calcification 08/31/2013  . Oral thrush 03/30/2013  . Lung nodule, right upper lobe 8 mm March 2014 02/04/2013  . Moderate COPD (chronic obstructive pulmonary disease) ([place]) 02/04/2013  . Smoking 02/04/2013    Past Surgical History:  Procedure Laterality Date  . APPLICATION OF ROBOTIC ASSISTANCE FOR SPINAL PROCEDURE N/A 02/23/2019   Procedure: APPLICATION OF ROBOTIC ASSISTANCE FOR SPINAL PROCEDURE;  Surgeon: Kristeen Miss, MD;  Location: [facility];  Service: Neurosurgery;  Laterality: N/A;  . BACK SURGERY    . BREAST SURGERY     radiation  . CARPAL TUNNEL RELEASE    . CERVICAL SPINE SURGERY    . EYE SURGERY Bilateral    cataracts  . GALLBLADDER SURGERY    . INNER EAR SURGERY    . POSTERIOR LUMBAR FUSION 4 LEVEL N/A 02/23/2019   Procedure: Thoracic ten to Lumbar two decompression with Posterior lumbar interbody fusion lumbar one-lumbar two with Osteotomy at Lumbar one-Lumbar two, Posterior arthrodesis Thoracic ten to Lumbar two with Mazor;  Surgeon: Kristeen Miss, MD;  Location: [facility];  Service: Neurosurgery;  Laterality: N/A;  . SPINE SURGERY       OB History   No obstetric history on file.      Home Medications    Prior to Admission medications   Medication Sig Start Date End Date Taking? Authorizing Provider  ACCU-CHEK AVIVA PLUS test strip USE AS INSTRUCTED 12/02/17   Sharion Balloon, FNP  ACCU-CHEK FASTCLIX LANCETS MISC Use to check blood sugar two times daily  Dx Code E11.9 01/13/15   Wardell Honour, MD  albuterol (PROVENTIL) (2.5 MG/3ML) 0.083% nebulizer solution Take 3 mLs (2.5 mg total) by nebulization every 6 (six) hours  as needed for wheezing or shortness of breath. Patient not taking: Reported on 02/09/2019 09/07/18   Evelina Dun A, FNP  amLODipine (NORVASC) 5 MG tablet Take 1 tablet (5 mg total) by mouth daily. 05/05/18   Evelina Dun A, FNP  Blood Glucose Monitoring Suppl (ACCU-CHEK AVIVA PLUS) W/DEVICE KIT 1 Device by Does not apply route 2 (two) times daily. 09/30/14   Wardell Honour, MD  budesonide-formoterol [facility]) 80-4.5 MCG/ACT inhaler Inhale 2 puffs into the lungs 2 (two) times daily. Patient not taking: Reported on 02/09/2019 12/14/18   Evelina Dun A, FNP  Insulin Glargine (LANTUS SOLOSTAR) 100 UNIT/ML Solostar Pen Inject 20-25 Units into the skin daily. Patient taking differently: Inject 22 Units into the skin every morning.  10/09/18   Evelina Dun A, FNP  Insulin Pen Needle (PEN NEEDLES) 32G X 4 MM MISC 1 applicator by Does not apply route daily. 10/09/18   Sharion Balloon, FNP  losartan (COZAAR) 100 MG tablet TAKE 1 TABLET EVERY DAY 02/08/19   Evelina Dun A, FNP  methocarbamol (ROBAXIN) 500 MG tablet Take 1 tablet (500 mg total) by mouth every 6 (six) hours as needed for muscle spasms. 02/25/19   Kristeen Miss, MD  oxyCODONE-acetaminophen (PERCOCET/ROXICET) 5-325 MG tablet Take 1-2 tablets by mouth every 3 (three) hours as needed for moderate pain or severe pain. 02/25/19   Kristeen Miss, MD  pravastatin (PRAVACHOL) 20 MG tablet TAKE 1 TABLET EVERY DAY 02/08/19   Sharion Balloon, FNP    Family History Family History  Problem Relation Age of Onset  . Heart attack Mother   . Stroke Mother   . Stroke Sister   . Diabetes Other        sister x3, and mother  . Lung disease Father        worked in [employer]  . Colon cancer Neg Hx   . Colon polyps Neg Hx     Social History Social History   Tobacco Use  . Smoking status: Current Every Day Smoker    Packs/day: 1.00    Years: 51.00    Pack years: 51.00    Types: Cigarettes  . Smokeless tobacco: Never Used  Substance Use  Topics  . Alcohol use: No  . Drug use: No     Allergies   Lipitor [atorvastatin] and Soma [carisoprodol]   Review of Systems Review of Systems  Constitutional: Negative for chills and fever.  Gastrointestinal: Positive for abdominal pain.  Genitourinary: Positive for difficulty urinating.  Neurological: Negative for weakness and numbness.     Physical Exam Updated Vital Signs BP (!) 157/89   Pulse 86   Temp 98.5 F (36.9 C)   Resp 18   Ht 1.549 m ('5\' 1"' )   Wt 43.5 kg   SpO2 98%   BMI 18.12 kg/m  Physical Exam Vitals signs and nursing note reviewed.  Constitutional:      General: She is not in acute distress.    Appearance: She is well-developed.  HENT:     Head: Normocephalic and atraumatic.     Mouth/Throat:     Pharynx: No oropharyngeal exudate.  Eyes:     General: No scleral icterus.       Right eye: No discharge.        Left eye: No discharge.     Conjunctiva/sclera: Conjunctivae normal.     Pupils: Pupils are equal, round, and reactive to light.  Neck:     Musculoskeletal: Normal range of motion and neck supple.     Thyroid: No thyromegaly.     Vascular: No JVD.  Cardiovascular:     Rate and Rhythm: Normal rate and regular rhythm.     Heart sounds: Normal heart sounds. No murmur. No friction rub. No gallop.   Pulmonary:     Effort: Pulmonary effort is normal. No respiratory distress.     Breath sounds: Wheezing present. No rales.     Comments: The patient has expiratory wheezing but states this is chronic, she is a half a pack per day smoker.  There is no increased work of breathing, no rales and oxygen is normal Abdominal:     General: Bowel sounds are normal. There is no distension.     Palpations: Abdomen is soft. There is no mass.     Tenderness: There is abdominal tenderness.     Comments: There is a fullness in the suprapubic area with mild tenderness but a very soft nontender abdomen in general.  There is no peritoneal signs and no guarding   Musculoskeletal: Normal range of motion.        General: No tenderness.  Lymphadenopathy:     Cervical: No cervical adenopathy.  Skin:    General: Skin is warm and dry.     Findings: No erythema or rash.  Neurological:     Mental Status: She is alert.     Coordination: Coordination normal.  Psychiatric:        Behavior: Behavior normal.      ED Treatments / Results  Labs (all labs ordered are listed, but only abnormal results are displayed) Labs Reviewed  URINALYSIS, ROUTINE W REFLEX MICROSCOPIC - Abnormal; Notable for the following components:      Result Value   Glucose, UA 50 (*)    Hgb urine dipstick SMALL (*)    Ketones, ur 5 (*)    Bacteria, UA RARE (*)    All other components within normal limits  URINE CULTURE    EKG None  Radiology No results found.  Procedures Procedures (including critical care time)  Medications Ordered in ED Medications - No data to display   Initial Impression / Assessment and Plan / ED Course  I have reviewed the triage vital signs and the nursing notes.  Pertinent labs & imaging results that were available during my care of the patient were reviewed by me and considered in my medical decision making (see chart for details).        Suspect urinary retention, would also consider potential infection, the patient is afebrile, she is not tachycardic, her heart rate is approximately 91, oxygenation of 95% despite the wheezing I suspect this is chronic.  She is not feeling short of breath out of her baseline.  Will place Foley catheter, measure urinary output and sent home with leg bag.  The patient has had approximately 450 cc of urine removed, she states that this completely resolved her symptoms, I have given her instructions on Foley catheter care at home and she will be outfitted with a leg bag.  Urinalysis reveals no signs of infection, she was informed on how to care for this and what to come back for.  She will see her doctor  and urology in 1 week.  Stable for discharge  Final Clinical Impressions(s) / ED Diagnoses   Final diagnoses:  Urinary retention    ED Discharge Orders    None       Noemi Chapel, MD 02/26/19 1535

## 2019-02-26 NOTE — Discharge Instructions (Signed)
Please keep your catheter in place for the next week, follow-up with your family doctor or the urologist to have this removed.  If you should develop increasing pain or fever or vomiting please come back to the emergency department immediately.  You do not have any signs of infection today.

## 2019-02-26 NOTE — ED Triage Notes (Signed)
Post op back surgery on Tuesday.  Foley removed on Wednesday afternoon and been having trouble voiding since yesterday.

## 2019-02-28 LAB — URINE CULTURE: Culture: 10000 — AB

## 2019-03-01 ENCOUNTER — Telehealth: Payer: Self-pay | Admitting: Family

## 2019-03-01 ENCOUNTER — Telehealth: Payer: Self-pay | Admitting: Emergency Medicine

## 2019-03-01 DIAGNOSIS — M5416 Radiculopathy, lumbar region: Secondary | ICD-10-CM | POA: Diagnosis not present

## 2019-03-01 DIAGNOSIS — I10 Essential (primary) hypertension: Secondary | ICD-10-CM | POA: Diagnosis not present

## 2019-03-01 DIAGNOSIS — J449 Chronic obstructive pulmonary disease, unspecified: Secondary | ICD-10-CM | POA: Diagnosis not present

## 2019-03-01 DIAGNOSIS — E1169 Type 2 diabetes mellitus with other specified complication: Secondary | ICD-10-CM | POA: Diagnosis not present

## 2019-03-01 DIAGNOSIS — E43 Unspecified severe protein-calorie malnutrition: Secondary | ICD-10-CM | POA: Diagnosis not present

## 2019-03-01 DIAGNOSIS — Z4789 Encounter for other orthopedic aftercare: Secondary | ICD-10-CM | POA: Diagnosis not present

## 2019-03-01 DIAGNOSIS — F329 Major depressive disorder, single episode, unspecified: Secondary | ICD-10-CM | POA: Diagnosis not present

## 2019-03-01 DIAGNOSIS — G8929 Other chronic pain: Secondary | ICD-10-CM | POA: Diagnosis not present

## 2019-03-01 DIAGNOSIS — F419 Anxiety disorder, unspecified: Secondary | ICD-10-CM | POA: Diagnosis not present

## 2019-03-01 NOTE — Telephone Encounter (Signed)
PT is calling to be scheduled for an apt for Thursday with Evelina Dun to get a urinary bag removed. Advised pt wasn't sure if this would be something that Alyse Low could do, would have the nurse call her back to let her know and get her scheduled. Pt started to yell at me and tell me that the hospital told her that Alyse Low could, again advised pt the nurse would call her to let her know if this is something that Evelina Dun could do.

## 2019-03-01 NOTE — Telephone Encounter (Signed)
Post ED Visit - Positive Culture Follow-up  Culture report reviewed by antimicrobial stewardship pharmacist: [place] Team []  Elenor Quinones, Pharm.D. []  Heide Guile, Pharm.D., BCPS AQ-ID []  Parks Neptune, Pharm.D., BCPS []  Alycia Rossetti, Pharm.D., BCPS []  [place], Pharm.D., BCPS, AAHIVP []  Legrand Como, Pharm.D., BCPS, AAHIVP [x]  Salome Arnt, PharmD, BCPS []  Johnnette Gourd, PharmD, BCPS []  Hughes Better, PharmD, BCPS []  Leeroy Cha, PharmD []  Laqueta Linden, PharmD, BCPS []  Albertina Parr, PharmD  [place] Team []  Leodis Sias, PharmD []  Lindell Spar, PharmD []  Royetta Asal, PharmD []  Graylin Shiver, Rph []  Rema Fendt) Glennon Mac, PharmD []  Arlyn Dunning, PharmD []  Netta Cedars, PharmD []  Dia Sitter, PharmD []  Leone Haven, PharmD []  Gretta Arab, PharmD []  Theodis Shove, PharmD []  Peggyann Juba, PharmD []  Reuel Boom, PharmD   Positive urine culture Low colony count,  no further patient follow-up is required at this time.  Hazle Nordmann 03/01/2019, 11:31 AM

## 2019-03-01 NOTE — Telephone Encounter (Signed)
Chart note says a foley was put in because patient could not make urine.  It says she needs to go to urology and see [place],  ( may need a referral to urology).    No answer when call was made to her home.

## 2019-03-01 NOTE — Telephone Encounter (Signed)
Patient has a follow up appointment scheduled. 

## 2019-03-03 ENCOUNTER — Other Ambulatory Visit: Payer: Self-pay

## 2019-03-03 DIAGNOSIS — F419 Anxiety disorder, unspecified: Secondary | ICD-10-CM | POA: Diagnosis not present

## 2019-03-03 DIAGNOSIS — M5416 Radiculopathy, lumbar region: Secondary | ICD-10-CM | POA: Diagnosis not present

## 2019-03-03 DIAGNOSIS — J449 Chronic obstructive pulmonary disease, unspecified: Secondary | ICD-10-CM | POA: Diagnosis not present

## 2019-03-03 DIAGNOSIS — Z4789 Encounter for other orthopedic aftercare: Secondary | ICD-10-CM | POA: Diagnosis not present

## 2019-03-03 DIAGNOSIS — E43 Unspecified severe protein-calorie malnutrition: Secondary | ICD-10-CM | POA: Diagnosis not present

## 2019-03-03 DIAGNOSIS — G8929 Other chronic pain: Secondary | ICD-10-CM | POA: Diagnosis not present

## 2019-03-03 DIAGNOSIS — E1169 Type 2 diabetes mellitus with other specified complication: Secondary | ICD-10-CM | POA: Diagnosis not present

## 2019-03-03 DIAGNOSIS — I10 Essential (primary) hypertension: Secondary | ICD-10-CM | POA: Diagnosis not present

## 2019-03-03 DIAGNOSIS — F329 Major depressive disorder, single episode, unspecified: Secondary | ICD-10-CM | POA: Diagnosis not present

## 2019-03-04 ENCOUNTER — Encounter: Payer: Self-pay | Admitting: Family

## 2019-03-04 ENCOUNTER — Ambulatory Visit (INDEPENDENT_AMBULATORY_CARE_PROVIDER_SITE_OTHER): Payer: Medicare HMO | Admitting: Family

## 2019-03-04 VITALS — BP 164/93 | HR 76 | Temp 98.5°F | Ht 61.0 in | Wt 91.8 lb

## 2019-03-04 DIAGNOSIS — M4005 Postural kyphosis, thoracolumbar region: Secondary | ICD-10-CM

## 2019-03-04 DIAGNOSIS — Z09 Encounter for follow-up examination after completed treatment for conditions other than malignant neoplasm: Secondary | ICD-10-CM | POA: Diagnosis not present

## 2019-03-04 DIAGNOSIS — F17200 Nicotine dependence, unspecified, uncomplicated: Secondary | ICD-10-CM | POA: Diagnosis not present

## 2019-03-04 DIAGNOSIS — M5441 Lumbago with sciatica, right side: Secondary | ICD-10-CM | POA: Diagnosis not present

## 2019-03-04 DIAGNOSIS — G8929 Other chronic pain: Secondary | ICD-10-CM

## 2019-03-04 DIAGNOSIS — R339 Retention of urine, unspecified: Secondary | ICD-10-CM

## 2019-03-04 DIAGNOSIS — J441 Chronic obstructive pulmonary disease with (acute) exacerbation: Secondary | ICD-10-CM

## 2019-03-04 NOTE — Progress Notes (Signed)
Subjective:    Patient ID: Jamie [name], female    DOB: [birth year], 68 y.o.   MRN: [id number]  Chief Complaint  Patient presents with  . Hospitalization Follow-up    HPI PT presents to the office today for hospital follow up. She had surgical decompression and stabilization and reduction of kyphosis with fixation from T10 to her previous L2-L4 fusion on 02/23/19. She was discharged on 02/25/19.   She then returned to the hospital on 02/26/19 with urinary retention. She had a negative urinalysis. She had a foley placed and told to follow up with her Urologists and PCP. She does not have Urologists.    She states she wants her foley out today and is very upset that she can not have it removed. She states "I will remove if you do not ".    She reports she quit smoking since her surgery. States she no longer wants to smoke.  Her back pain is improved, but still has intermittent aching pain. She states she is able to stand up straight now.    Review of Systems  Respiratory: Positive for shortness of breath and wheezing.   Genitourinary: Positive for difficulty urinating.  Musculoskeletal: Positive for back pain.  All other systems reviewed and are negative.      Objective:   Physical Exam Vitals signs reviewed.  Constitutional:      General: She is not in acute distress.    Appearance: She is well-developed.  HENT:     Head: Normocephalic and atraumatic.     Right Ear: Tympanic membrane normal.     Left Ear: Tympanic membrane normal.     Ears:     Comments: HOH  Eyes:     Pupils: Pupils are equal, round, and reactive to light.  Neck:     Musculoskeletal: Normal range of motion and neck supple.     Thyroid: No thyromegaly.  Cardiovascular:     Rate and Rhythm: Normal rate and regular rhythm.     Heart sounds: Normal heart sounds. No murmur.  Pulmonary:     Effort: Pulmonary effort is normal. No respiratory distress.     Breath sounds: Normal breath sounds. No  wheezing.  Abdominal:     General: Bowel sounds are normal. There is no distension.     Palpations: Abdomen is soft.     Tenderness: There is no abdominal tenderness.  Musculoskeletal: Normal range of motion.        General: No tenderness.       Arms:  Skin:    General: Skin is warm and dry.     Comments: well approximated wound on back from T10 down to her lumbar region, with dressing intact , pt able to move and stand with minimal pain  Neurological:     Mental Status: She is alert and oriented to person, place, and time.     Cranial Nerves: No cranial nerve deficit.     Deep Tendon Reflexes: Reflexes are normal and symmetric.  Psychiatric:        Mood and Affect: Affect is angry.        Behavior: Behavior normal.        Thought Content: Thought content normal.        Judgment: Judgment normal.     BP (!) 164/93   Pulse 76   Temp 98.5 F (36.9 C) (Oral)   Ht 5\' 1"  (1.549 m)   Wt 91 lb 12.8 oz (41.6 kg)  BMI 17.35 kg/m      Assessment & Plan:  Jamie [name] comes in today with chief complaint of Hospitalization Follow-up   Diagnosis and orders addressed:  1. Hospital discharge follow-up  2. Chronic bilateral low back pain with right-sided sciatica  3. Postural kyphosis of lumbar region  4. Smoking Continue to sustain from smoking  5. Urinary retention Long discussion with patient that if I remove foley and she can not urinate she would have to go back to the ED. She agrees and understands and will wait for her Urologists appt Keep area clean and dry - Ambulatory referral to Urology  6. COPD with acute exacerbation ([place]) Continue with inhalers  Health Maintenance reviewed Diet and exercise encouraged  Follow up plan: Has follow up on 03/16/19, keep Urologists appt. Encouraged patient to call Neurosurgery and make sure she does not need follow up appt  Evelina Dun, FNP

## 2019-03-04 NOTE — Patient Instructions (Signed)
Acute Urinary Retention, Female    Acute urinary retention means that you cannot pee (urinate) at all, or that you pee too little and your bladder is not emptied completely. If it is not treated, it can lead to kidney damage or other serious problems.  Follow these instructions at home:   Take over-the-counter and prescription medicines only as told by your doctor. Ask your doctor what medicines you should stay away from. Do not take any medicine unless your doctor says it is okay to do so.   If you were sent home with a tube that drains pee from the bladder (catheter), take care of it as told by your doctor.   Drink enough fluid to keep your pee clear or pale yellow.   If you were given an antibiotic, take it as told by your doctor. Do not stop taking the antibiotic even if you start to feel better.   Do not use any products that contain nicotine or tobacco, such as cigarettes and e-cigarettes. If you need help quitting, ask your doctor.   Watch for changes in your symptoms. Tell your doctor about them.   If told, keep track of any changes in your blood pressure at home. Tell your doctor about them.   Keep all follow-up visits as told by your doctor. This is important.  Contact a doctor if:   You have spasms or you leak pee when you have spasms.  Get help right away if:   You have chills or a fever.   You have blood in your pee.   You have a tube that drains the bladder and:  ? The tube stops draining pee.  ? The tube falls out.  Summary   Acute urinary retention means that you cannot pee at all, or that you pee too little and your bladder is not emptied completely. If it is not treated, it can result in kidney damage or other serious problems.   If you were sent home with a tube that drains pee from the bladder, take care of it as told by your doctor.   Pay attention to any changes in your symptoms. Tell your doctor about them.  This information is not intended to replace advice given to you by your  health care provider. Make sure you discuss any questions you have with your health care provider.  Document Released: 03/18/2008 Document Revised: 11/01/2016 Document Reviewed: 11/01/2016  Elsevier Interactive Patient Education  2019 [employer] Inc[employer]

## 2019-03-05 ENCOUNTER — Other Ambulatory Visit: Payer: Self-pay | Admitting: Family

## 2019-03-05 DIAGNOSIS — I10 Essential (primary) hypertension: Secondary | ICD-10-CM

## 2019-03-10 DIAGNOSIS — F329 Major depressive disorder, single episode, unspecified: Secondary | ICD-10-CM | POA: Diagnosis not present

## 2019-03-10 DIAGNOSIS — E43 Unspecified severe protein-calorie malnutrition: Secondary | ICD-10-CM | POA: Diagnosis not present

## 2019-03-10 DIAGNOSIS — Z4789 Encounter for other orthopedic aftercare: Secondary | ICD-10-CM | POA: Diagnosis not present

## 2019-03-10 DIAGNOSIS — M5416 Radiculopathy, lumbar region: Secondary | ICD-10-CM | POA: Diagnosis not present

## 2019-03-10 DIAGNOSIS — G8929 Other chronic pain: Secondary | ICD-10-CM | POA: Diagnosis not present

## 2019-03-10 DIAGNOSIS — F419 Anxiety disorder, unspecified: Secondary | ICD-10-CM | POA: Diagnosis not present

## 2019-03-10 DIAGNOSIS — E1169 Type 2 diabetes mellitus with other specified complication: Secondary | ICD-10-CM | POA: Diagnosis not present

## 2019-03-10 DIAGNOSIS — I10 Essential (primary) hypertension: Secondary | ICD-10-CM | POA: Diagnosis not present

## 2019-03-10 DIAGNOSIS — J449 Chronic obstructive pulmonary disease, unspecified: Secondary | ICD-10-CM | POA: Diagnosis not present

## 2019-03-12 DIAGNOSIS — R338 Other retention of urine: Secondary | ICD-10-CM | POA: Diagnosis not present

## 2019-03-15 DIAGNOSIS — M5416 Radiculopathy, lumbar region: Secondary | ICD-10-CM | POA: Diagnosis not present

## 2019-03-15 DIAGNOSIS — J449 Chronic obstructive pulmonary disease, unspecified: Secondary | ICD-10-CM | POA: Diagnosis not present

## 2019-03-15 DIAGNOSIS — Z4789 Encounter for other orthopedic aftercare: Secondary | ICD-10-CM | POA: Diagnosis not present

## 2019-03-15 DIAGNOSIS — E43 Unspecified severe protein-calorie malnutrition: Secondary | ICD-10-CM | POA: Diagnosis not present

## 2019-03-15 DIAGNOSIS — G8929 Other chronic pain: Secondary | ICD-10-CM | POA: Diagnosis not present

## 2019-03-15 DIAGNOSIS — F329 Major depressive disorder, single episode, unspecified: Secondary | ICD-10-CM | POA: Diagnosis not present

## 2019-03-15 DIAGNOSIS — F419 Anxiety disorder, unspecified: Secondary | ICD-10-CM | POA: Diagnosis not present

## 2019-03-15 DIAGNOSIS — I10 Essential (primary) hypertension: Secondary | ICD-10-CM | POA: Diagnosis not present

## 2019-03-15 DIAGNOSIS — E1169 Type 2 diabetes mellitus with other specified complication: Secondary | ICD-10-CM | POA: Diagnosis not present

## 2019-03-16 ENCOUNTER — Ambulatory Visit: Payer: Medicare HMO | Admitting: Family

## 2019-03-17 DIAGNOSIS — M4015 Other secondary kyphosis, thoracolumbar region: Secondary | ICD-10-CM | POA: Diagnosis not present

## 2019-04-08 ENCOUNTER — Telehealth: Payer: Self-pay | Admitting: *Deleted

## 2019-04-08 NOTE — Telephone Encounter (Signed)
She would like to know if we received and filled out forms that were sent over on 03/01/19 for this pt. 1 was a plan of care - she has not received it (doc # [id number])  1 was a visit frequency order for occupational therapy.   Phone # to call is [phone] ext [phone] Fax # is [phone]

## 2019-04-12 NOTE — Telephone Encounter (Signed)
OT order reprinted from scanned documents & refaxed. Plan of care is done by billing, message left for this department

## 2019-04-19 ENCOUNTER — Other Ambulatory Visit: Payer: Self-pay

## 2019-04-20 ENCOUNTER — Encounter: Payer: Self-pay | Admitting: Family

## 2019-04-20 ENCOUNTER — Ambulatory Visit (INDEPENDENT_AMBULATORY_CARE_PROVIDER_SITE_OTHER): Payer: Medicare HMO | Admitting: Family

## 2019-04-20 VITALS — BP 139/82 | HR 85 | Temp 98.0°F | Ht 61.0 in | Wt 88.4 lb

## 2019-04-20 DIAGNOSIS — E1169 Type 2 diabetes mellitus with other specified complication: Secondary | ICD-10-CM

## 2019-04-20 DIAGNOSIS — E785 Hyperlipidemia, unspecified: Secondary | ICD-10-CM

## 2019-04-20 DIAGNOSIS — E118 Type 2 diabetes mellitus with unspecified complications: Secondary | ICD-10-CM

## 2019-04-20 DIAGNOSIS — J441 Chronic obstructive pulmonary disease with (acute) exacerbation: Secondary | ICD-10-CM | POA: Diagnosis not present

## 2019-04-20 DIAGNOSIS — J449 Chronic obstructive pulmonary disease, unspecified: Secondary | ICD-10-CM | POA: Diagnosis not present

## 2019-04-20 DIAGNOSIS — E0865 Diabetes mellitus due to underlying condition with hyperglycemia: Secondary | ICD-10-CM | POA: Diagnosis not present

## 2019-04-20 LAB — BAYER DCA HB A1C WAIVED: HB A1C (BAYER DCA - WAIVED): 7.8 % — ABNORMAL HIGH (ref <7.0)

## 2019-04-20 NOTE — Progress Notes (Signed)
Subjective:    Patient ID: Jamie [name], female    DOB: Mar 21, 1951, 68 y.o.   MRN: [id number]  Chief Complaint  Patient presents with  . Diabetes   PT presents to the office today for chronic follow up.  PT had surgical decompression and stabilization from L2-L4 on 02/05/17. Pt states her back continues to hurtand her pain is a6-7out 10 when she walks or standing.  Diabetes She presents for her follow-up diabetic visit. She has type 2 diabetes mellitus. Her disease course has been worsening. There are no hypoglycemic associated symptoms. Associated symptoms include weakness. Pertinent negatives for diabetes include no blurred vision and no foot paresthesias. Symptoms are stable. Diabetic complications include heart disease. Risk factors for coronary artery disease include dyslipidemia, hypertension, sedentary lifestyle and post-menopausal. She is following a generally healthy diet. Her overall blood glucose range is 110-130 mg/dl.  Hyperlipidemia This is a chronic problem. The current episode started more than 1 year ago. The problem is controlled. Recent lipid tests were reviewed and are normal. Current antihyperlipidemic treatment includes statins. The current treatment provides moderate improvement of lipids. Risk factors for coronary artery disease include dyslipidemia, diabetes mellitus, hypertension, post-menopausal and a sedentary lifestyle.  Back Pain This is a chronic problem. The current episode started more than 1 year ago. The problem occurs intermittently. The pain is present in the lumbar spine. The pain is at a severity of 7/10. The pain is moderate. The symptoms are aggravated by bending and twisting. Associated symptoms include weakness. She has tried NSAIDs for the symptoms. The treatment provided mild relief.  COPD PT continues to smoking a pack a day. She states she could not afford the Symbicort.     Review of Systems  Eyes: Negative for blurred vision.   Musculoskeletal: Positive for back pain.  Neurological: Positive for weakness.  All other systems reviewed and are negative.      Objective:   Physical Exam Vitals signs reviewed.  Constitutional:      General: She is not in acute distress.    Appearance: She is well-developed.  HENT:     Head: Normocephalic and atraumatic.     Right Ear: Tympanic membrane normal.     Left Ear: Tympanic membrane normal.  Eyes:     Pupils: Pupils are equal, round, and reactive to light.  Neck:     Musculoskeletal: Normal range of motion and neck supple.     Thyroid: No thyromegaly.  Cardiovascular:     Rate and Rhythm: Normal rate and regular rhythm.     Heart sounds: Normal heart sounds. No murmur.  Pulmonary:     Effort: Pulmonary effort is normal. No respiratory distress.     Breath sounds: Normal breath sounds. No wheezing.  Abdominal:     General: Bowel sounds are normal. There is no distension.     Palpations: Abdomen is soft.     Tenderness: There is no abdominal tenderness.  Musculoskeletal: Normal range of motion.        General: No tenderness.  Skin:    General: Skin is warm and dry.  Neurological:     Mental Status: She is alert and oriented to person, place, and time.     Cranial Nerves: No cranial nerve deficit.     Deep Tendon Reflexes: Reflexes are normal and symmetric.  Psychiatric:        Behavior: Behavior normal.        Thought Content: Thought content normal.  Judgment: Judgment normal.       BP 139/82   Pulse 85   Temp 98 F (36.7 C) (Oral)   Ht '5\' 1"'  (1.549 m)   Wt 88 lb 6.4 oz (40.1 kg)   BMI 16.70 kg/m      Assessment & Plan:  Jamie [name] comes in today with chief complaint of Diabetes   Diagnosis and orders addressed:  1. Moderate COPD (chronic obstructive pulmonary disease) (HCC) - CMP14+EGFR - CBC with Differential/Platelet  2. COPD with acute exacerbation ([facility]) - CMP14+EGFR - CBC with Differential/Platelet  3. Hyperlipidemia  associated with type 2 diabetes mellitus ([place]) - CMP14+EGFR - CBC with Differential/Platelet - Lipid panel  4. Diabetes mellitus with complication ([place]) - XIH03+UUEK - CBC with Differential/Platelet - Bayer DCA Hb A1c Waived - Microalbumin / creatinine urine ratio  5. Diabetes mellitus due to underlying condition, uncontrolled, with hyperglycemia, without long-term current use of insulin (HCC) - CMP14+EGFR - CBC with Differential/Platelet - Bayer DCA Hb A1c Waived - Microalbumin / creatinine urine ratio   Labs pending Health Maintenance reviewed Diet and exercise encouraged  Follow up plan: 3 months   Evelina Dun, FNP

## 2019-04-20 NOTE — Patient Instructions (Signed)

## 2019-04-21 LAB — CMP14+EGFR
ALT: 10 IU/L (ref 0–32)
AST: 13 IU/L (ref 0–40)
Albumin/Globulin Ratio: 1.9 (ref 1.2–2.2)
Albumin: 4.1 g/dL (ref 3.8–4.8)
Alkaline Phosphatase: 107 IU/L (ref 39–117)
BUN/Creatinine Ratio: 18 (ref 12–28)
BUN: 17 mg/dL (ref 8–27)
Bilirubin Total: <0.2 mg/dL (ref 0.0–1.2)
CO2: 22 mmol/L (ref 20–29)
Calcium: 9.1 mg/dL (ref 8.7–10.3)
Chloride: 99 mmol/L (ref 96–106)
Creatinine, Ser: 0.94 mg/dL (ref 0.57–1.00)
GFR calc Af Amer: 73 mL/min/{1.73_m2} (ref >59)
GFR calc non Af Amer: 63 mL/min/{1.73_m2} (ref >59)
Globulin, Total: 2.2 g/dL (ref 1.5–4.5)
Glucose: 346 mg/dL — ABNORMAL HIGH (ref 65–99)
Potassium: 4.7 mmol/L (ref 3.5–5.2)
Sodium: 138 mmol/L (ref 134–144)
Total Protein: 6.3 g/dL (ref 6.0–8.5)

## 2019-04-21 LAB — CBC WITH DIFFERENTIAL/PLATELET
Basophils Absolute: 0.1 10*3/uL (ref 0.0–0.2)
Basos: 1 %
EOS (ABSOLUTE): 0.1 10*3/uL (ref 0.0–0.4)
Eos: 2 %
Hematocrit: 36 % (ref 34.0–46.6)
Hemoglobin: 11.4 g/dL (ref 11.1–15.9)
Immature Grans (Abs): 0 10*3/uL (ref 0.0–0.1)
Immature Granulocytes: 0 %
Lymphocytes Absolute: 1.7 10*3/uL (ref 0.7–3.1)
Lymphs: 28 %
MCH: 25 pg — ABNORMAL LOW (ref 26.6–33.0)
MCHC: 31.7 g/dL (ref 31.5–35.7)
MCV: 79 fL (ref 79–97)
Monocytes Absolute: 0.5 10*3/uL (ref 0.1–0.9)
Monocytes: 8 %
Neutrophils Absolute: 3.8 10*3/uL (ref 1.4–7.0)
Neutrophils: 61 %
Platelets: 257 10*3/uL (ref 150–450)
RBC: 4.56 x10E6/uL (ref 3.77–5.28)
RDW: 13.9 % (ref 11.7–15.4)
WBC: 6.2 10*3/uL (ref 3.4–10.8)

## 2019-04-21 LAB — LIPID PANEL
Chol/HDL Ratio: 2.5 ratio (ref 0.0–4.4)
Cholesterol, Total: 155 mg/dL (ref 100–199)
HDL: 63 mg/dL (ref >39)
LDL Calculated: 73 mg/dL (ref 0–99)
Triglycerides: 97 mg/dL (ref 0–149)
VLDL Cholesterol Cal: 19 mg/dL (ref 5–40)

## 2019-04-22 ENCOUNTER — Other Ambulatory Visit: Payer: Self-pay | Admitting: Family

## 2019-04-27 ENCOUNTER — Telehealth: Payer: Self-pay | Admitting: Family

## 2019-04-27 NOTE — Chronic Care Management (AMB) (Signed)
Chronic Care Management   Note  04/27/2019 Name: KRISALYN YANKOWSKI MRN: [id number] DOB: Dec 16, 1950  BABBETTE DALESANDRO is a 68 y.o. year old female who is a primary care patient of Sharion Balloon, FNP. I reached out to Paulita Fujita by phone today in response to a referral sent by Ms. Victorino Sparrow Sadowsky's health plan.    Ms. Harries was given information about Chronic Care Management services today including:  1. CCM service includes personalized support from designated clinical staff supervised by her physician, including individualized plan of care and coordination with other care providers 2. 24/7 contact phone numbers for assistance for urgent and routine care needs. 3. Service will only be billed when office clinical staff spend 20 minutes or more in a month to coordinate care. 4. Only one practitioner may furnish and bill the service in a calendar month. 5. The patient may stop CCM services at any time (effective at the end of the month) by phone call to the office staff. 6. The patient will be responsible for cost sharing (co-pay) of up to 20% of the service fee (after annual deductible is met).  Patient agreed to services and verbal consent obtained.   Follow up plan: Telephone appointment with CCM team member scheduled for: 05/04/2019  [place]  ??[email or web address]   ??[phone]

## 2019-05-04 ENCOUNTER — Ambulatory Visit (INDEPENDENT_AMBULATORY_CARE_PROVIDER_SITE_OTHER): Payer: Medicare HMO | Admitting: *Deleted

## 2019-05-04 DIAGNOSIS — F17200 Nicotine dependence, unspecified, uncomplicated: Secondary | ICD-10-CM

## 2019-05-04 DIAGNOSIS — R636 Underweight: Secondary | ICD-10-CM

## 2019-05-04 DIAGNOSIS — E118 Type 2 diabetes mellitus with unspecified complications: Secondary | ICD-10-CM

## 2019-05-04 DIAGNOSIS — J441 Chronic obstructive pulmonary disease with (acute) exacerbation: Secondary | ICD-10-CM

## 2019-05-04 DIAGNOSIS — G8929 Other chronic pain: Secondary | ICD-10-CM

## 2019-05-04 DIAGNOSIS — M5416 Radiculopathy, lumbar region: Secondary | ICD-10-CM

## 2019-05-05 NOTE — Patient Instructions (Signed)
Visit Information  Goals Addressed            This Visit's Progress     Patient Stated   . "I want to keep my back pain under control" (pt-stated)       Current Barriers:  . Chronic Disease Management support and education needs related to chronic back pain  Nurse Case Manager Clinical Goal(s):  Marland Kitchen Over the next 30 days, patient will verbalize understanding of plan for management of chronic back pain . Over the next 30 days, patient will meet with RN Care Manager to address plan for self management of chronic back pain  Interventions:  . Advised patient to take medications as prescribed . Provided education to patient re: positive effects of exercise and physical fitness on chronic back pain . Discussed characteristics of current pain, current treatments, and prior treatments . Recommended to consider physical therapy for abd/back strength training Patient Self Care Activities:  . Self administers medications as prescribed . Attends all scheduled provider appointments . Calls provider office for new concerns or questions  Initial goal documentation     . "I would like to be able to hear better" (pt-stated)       Current Barriers:  . Film/video editor.  . Chronic Disease Management support and education needs related to hearing loss and need for hearing aids  Nurse Case Manager Clinical Goal(s):  Marland Kitchen Over the next 30 days, patient will verbalize understanding of plan for self management of hearing loss . Over the next 30 days, patient will meet with RN Care Manager to address hearing loss and benefits of hearing aids  Interventions:  . Advised patient to keep all appointments . Provided education to patient re: types of hearing loss . RN Care Manager will collaborate with LCSW regarding hearing aid resources  Patient Self Care Activities:  . Self administers medications as prescribed . Attends all scheduled provider appointments . Calls provider office for new concerns  or questions  Initial goal documentation     . "I would like to control my blood sugar better" (pt-stated)       Current Barriers:  . Chronic Disease Management support and education needs related to diabetes . Cost associated with diabetes management  Nurse Case Manager Clinical Goal(s):  Marland Kitchen Over the next 30 days, patient will verbalize understanding of plan for self management of diabetes . Over the next 30 days, patient will meet with RN Care Manager to address blood sugar management  Interventions:  . Evaluation of current treatment plan related to diabetes and patient's adherence to plan as established by provider. . Advised patient to continue taking medications as prescribed . Provided education to patient re: diabetic diet/meal planning . Reviewed medications with patient and discussed Novolin vs Lantus . Discussed current insulin type and dosing instructions  Patient Self Care Activities:  . Self administers medications as prescribed . Calls pharmacy for medication refills . Calls provider office for new concerns or questions  Initial goal documentation       Other   . Quit Smoking       Barriers: Tobacco addiction  Nurse Care Manager Clinical Goals: Over the next 3 days, patient will decrease by half the amount of cigarettes she smokes.   Interventions:  . Smoking cessation resources printed and will be mailed.   Patient Self Care Activities:  . Self administers medications as prescribed . Calls provider office for new concerns or questions  Initial goal documentation  The patient verbalized understanding of instructions provided today and declined a print copy of patient instruction materials.   The care management team will reach out to the patient again over the next 30 days.   Chong Sicilian BSN, RN-BC Embedded Chronic Care Manager [facility] [place] [facility] Direct Dial: [phone]

## 2019-05-05 NOTE — Chronic Care Management (AMB) (Signed)
Chronic Care Management   Initial Visit Note  05/05/2019 Name: Jamie [name] MRN: [id number] DOB: August 28, 1951  Referred by: Sharion Balloon, FNP Reason for referral : Chronic Care Management   Jamie [name] is a 68 y.o. year old female who is a primary care patient of Sharion Balloon, FNP. The CCM team was consulted for assistance with chronic disease management and care coordination needs.   Review of patient status, including review of consultants reports, relevant laboratory and other test results, and collaboration with appropriate care team members and the patient's provider was performed as part of comprehensive patient evaluation and provision of chronic care management services.    SDOH (Social Determinants of Health) screening performed today. See Care Plan Entry related to challenges with: Tobacco Use Physical Activity  Subjective: I soke with Jamie [name] by telephone today. She has multiple chronic medical conditions and would benefit from [facility] Services and care coordination. Her primary concerns are diabetes management, chronic pain management, and hearing loss. She states that her insulin was changed from Novolin to Lantus. She prefers the Novolin and says that her blood sugar was under better control when she was using it. She is also concerned about the cost of the pen needles which is approximately $10 for a 90 day supply. She did not have to pay for the syring/needles at all when he was taking Novolin. She has chronic back pain and suffers form osteoporosis as well. She has had multiple spinal surgeries. She is not very physically active and I question if PT would be helpful in strengtheninb her back and core muscles to aid in proper sitting and standing posture. She also complains of hearing loss resulting from many ear infections as a child. Her mother was advised to have tubes placed but she declined.   Objective:  Lab Results  Component Value Date   HGBA1C 7.8 (H) 04/20/2019    Wt Readings from Last 3 Encounters:  04/20/19 88 lb 6.4 oz (40.1 kg)  03/04/19 91 lb 12.8 oz (41.6 kg)  02/26/19 95 lb 14.4 oz (43.5 kg)   BMI Readings from Last 3 Encounters:  04/20/19 16.70 kg/m  03/04/19 17.35 kg/m  02/26/19 18.12 kg/m   Outpatient Encounter Medications as of 05/04/2019  Medication Sig  . ACCU-CHEK AVIVA PLUS test strip USE AS INSTRUCTED  . ACCU-CHEK FASTCLIX LANCETS MISC Use to check blood sugar two times daily  Dx Code E11.9  . albuterol (PROVENTIL) (2.5 MG/3ML) 0.083% nebulizer solution Take 3 mLs (2.5 mg total) by nebulization every 6 (six) hours as needed for wheezing or shortness of breath.  Marland Kitchen amLODipine (NORVASC) 5 MG tablet TAKE 1 TABLET EVERY DAY  . Blood Glucose Monitoring Suppl (ACCU-CHEK AVIVA PLUS) W/DEVICE KIT 1 Device by Does not apply route 2 (two) times daily.  . Insulin Glargine (LANTUS SOLOSTAR) 100 UNIT/ML Solostar Pen Inject 20-25 Units into the skin daily. (Patient taking differently: Inject 22 Units into the skin every morning. )  . Insulin Pen Needle (PEN NEEDLES) 32G X 4 MM MISC 1 applicator by Does not apply route daily.  Marland Kitchen losartan (COZAAR) 100 MG tablet TAKE 1 TABLET EVERY DAY  . methocarbamol (ROBAXIN) 500 MG tablet Take 1 tablet (500 mg total) by mouth every 6 (six) hours as needed for muscle spasms.  . pravastatin (PRAVACHOL) 20 MG tablet TAKE 1 TABLET EVERY DAY    Goals Addressed      Patient Stated   . "I want to keep my back  pain under control" (pt-stated)       Current Barriers:  . Chronic Disease Management support and education needs related to chronic back pain  Nurse Case Manager Clinical Goal(s):  Marland Kitchen Over the next 30 days, patient will verbalize understanding of plan for management of chronic back pain . Over the next 30 days, patient will meet with RN Care Manager to address plan for self management of chronic back pain  Interventions:  . Advised patient to take medications as prescribed . Provided education to  patient re: positive effects of exercise and physical fitness on chronic back pain . Discussed characteristics of current pain, current treatments, and prior treatments . Recommended to consider physical therapy for abd/back strength training Patient Self Care Activities:  . Self administers medications as prescribed . Attends all scheduled provider appointments . Calls provider office for new concerns or questions  Initial goal documentation     . "I would like to be able to hear better" (pt-stated)       Current Barriers:  . Film/video editor.  . Chronic Disease Management support and education needs related to hearing loss and need for hearing aids  Nurse Case Manager Clinical Goal(s):  Marland Kitchen Over the next 30 days, patient will verbalize understanding of plan for self management of hearing loss . Over the next 30 days, patient will meet with RN Care Manager to address hearing loss and benefits of hearing aids  Interventions:  . Advised patient to keep all appointments . Provided education to patient re: types of hearing loss . RN Care Manager will collaborate with LCSW regarding hearing aid resources  Patient Self Care Activities:  . Self administers medications as prescribed . Attends all scheduled provider appointments . Calls provider office for new concerns or questions  Initial goal documentation     . "I would like to control my blood sugar better" (pt-stated)       Current Barriers:  . Chronic Disease Management support and education needs related to diabetes . Cost associated with diabetes management  Nurse Case Manager Clinical Goal(s):  Marland Kitchen Over the next 30 days, patient will verbalize understanding of plan for self management of diabetes . Over the next 30 days, patient will meet with RN Care Manager to address blood sugar management  Interventions:  . Evaluation of current treatment plan related to diabetes and patient's adherence to plan as established by  provider. . Advised patient to continue taking medications as prescribed . Provided education to patient re: diabetic diet/meal planning . Reviewed medications with patient and discussed Novolin vs Lantus . Discussed current insulin type and dosing instructions  Patient Self Care Activities:  . Self administers medications as prescribed . Calls pharmacy for medication refills . Calls provider office for new concerns or questions  Initial goal documentation       Other   . Quit Smoking       Barriers: Tobacco addiction  Nurse Care Manager Clinical Goals: Over the next 3 days, patient will decrease by half the amount of cigarettes she smokes.   Interventions:  . Smoking cessation resources printed and will be mailed.   Patient Self Care Activities:  . Self administers medications as prescribed . Calls provider office for new concerns or questions  Initial goal documentation        The care management team will reach out to the patient again over the next 30 days.   Chong Sicilian BSN, RN-BC Embedded Chronic Care Manager [place] /  [place] Management Direct Dial: [phone]

## 2019-05-26 DIAGNOSIS — Z681 Body mass index (BMI) 19 or less, adult: Secondary | ICD-10-CM | POA: Diagnosis not present

## 2019-05-26 DIAGNOSIS — I10 Essential (primary) hypertension: Secondary | ICD-10-CM | POA: Diagnosis not present

## 2019-05-26 DIAGNOSIS — M4015 Other secondary kyphosis, thoracolumbar region: Secondary | ICD-10-CM | POA: Diagnosis not present

## 2019-07-23 ENCOUNTER — Telehealth: Payer: Self-pay | Admitting: Family

## 2019-07-26 ENCOUNTER — Encounter: Payer: Self-pay | Admitting: Family

## 2019-07-26 ENCOUNTER — Other Ambulatory Visit: Payer: Self-pay

## 2019-07-26 ENCOUNTER — Other Ambulatory Visit: Payer: Self-pay | Admitting: Family

## 2019-07-26 ENCOUNTER — Ambulatory Visit (INDEPENDENT_AMBULATORY_CARE_PROVIDER_SITE_OTHER): Payer: Medicare HMO | Admitting: Family

## 2019-07-26 VITALS — BP 152/77 | HR 72 | Temp 96.8°F | Ht 61.0 in | Wt 95.2 lb

## 2019-07-26 DIAGNOSIS — E1169 Type 2 diabetes mellitus with other specified complication: Secondary | ICD-10-CM | POA: Diagnosis not present

## 2019-07-26 DIAGNOSIS — R636 Underweight: Secondary | ICD-10-CM

## 2019-07-26 DIAGNOSIS — H9193 Unspecified hearing loss, bilateral: Secondary | ICD-10-CM

## 2019-07-26 DIAGNOSIS — E785 Hyperlipidemia, unspecified: Secondary | ICD-10-CM | POA: Diagnosis not present

## 2019-07-26 DIAGNOSIS — K219 Gastro-esophageal reflux disease without esophagitis: Secondary | ICD-10-CM | POA: Diagnosis not present

## 2019-07-26 DIAGNOSIS — M5441 Lumbago with sciatica, right side: Secondary | ICD-10-CM | POA: Diagnosis not present

## 2019-07-26 DIAGNOSIS — J441 Chronic obstructive pulmonary disease with (acute) exacerbation: Secondary | ICD-10-CM | POA: Diagnosis not present

## 2019-07-26 DIAGNOSIS — Z23 Encounter for immunization: Secondary | ICD-10-CM | POA: Diagnosis not present

## 2019-07-26 DIAGNOSIS — E559 Vitamin D deficiency, unspecified: Secondary | ICD-10-CM

## 2019-07-26 DIAGNOSIS — I10 Essential (primary) hypertension: Secondary | ICD-10-CM

## 2019-07-26 DIAGNOSIS — E0865 Diabetes mellitus due to underlying condition with hyperglycemia: Secondary | ICD-10-CM

## 2019-07-26 DIAGNOSIS — M5416 Radiculopathy, lumbar region: Secondary | ICD-10-CM | POA: Diagnosis not present

## 2019-07-26 DIAGNOSIS — I2584 Coronary atherosclerosis due to calcified coronary lesion: Secondary | ICD-10-CM

## 2019-07-26 DIAGNOSIS — I2510 Atherosclerotic heart disease of native coronary artery without angina pectoris: Secondary | ICD-10-CM

## 2019-07-26 DIAGNOSIS — J449 Chronic obstructive pulmonary disease, unspecified: Secondary | ICD-10-CM

## 2019-07-26 DIAGNOSIS — G8929 Other chronic pain: Secondary | ICD-10-CM

## 2019-07-26 DIAGNOSIS — F17200 Nicotine dependence, unspecified, uncomplicated: Secondary | ICD-10-CM

## 2019-07-26 DIAGNOSIS — E118 Type 2 diabetes mellitus with unspecified complications: Secondary | ICD-10-CM | POA: Diagnosis not present

## 2019-07-26 LAB — BAYER DCA HB A1C WAIVED: HB A1C (BAYER DCA - WAIVED): 8.3 % — ABNORMAL HIGH (ref <7.0)

## 2019-07-26 MED ORDER — PRAVASTATIN SODIUM 20 MG PO TABS: 20.0000 mg | ORAL_TABLET | Freq: Every day | ORAL | 1 refills | Status: DC

## 2019-07-26 MED ORDER — AMLODIPINE BESYLATE 5 MG PO TABS: 5.0000 mg | ORAL_TABLET | Freq: Every day | ORAL | 1 refills | Status: DC

## 2019-07-26 MED ORDER — LANTUS SOLOSTAR 100 UNIT/ML ~~LOC~~ SOPN: 20.0000 [IU] | PEN_INJECTOR | Freq: Every day | SUBCUTANEOUS | 3 refills | Status: DC

## 2019-07-26 MED ORDER — LOSARTAN POTASSIUM 100 MG PO TABS: 100.0000 mg | ORAL_TABLET | Freq: Every day | ORAL | 1 refills | Status: DC

## 2019-07-26 NOTE — Patient Instructions (Signed)
Health Maintenance After Age 67 After age 67, you are at a higher risk for certain long-term diseases and infections as well as injuries from falls. Falls are a major cause of broken bones and head injuries in people who are older than age 67. Getting regular preventive care can help to keep you healthy and well. Preventive care includes getting regular testing and making lifestyle changes as recommended by your health care provider. Talk with your health care provider about:  Which screenings and tests you should have. A screening is a test that checks for a disease when you have no symptoms.  A diet and exercise plan that is right for you. What should I know about screenings and tests to prevent falls? Screening and testing are the best ways to find a health problem early. Early diagnosis and treatment give you the best chance of managing medical conditions that are common after age 67. Certain conditions and lifestyle choices may make you more likely to have a fall. Your health care provider may recommend:  Regular vision checks. Poor vision and conditions such as cataracts can make you more likely to have a fall. If you wear glasses, make sure to get your prescription updated if your vision changes.  Medicine review. Work with your health care provider to regularly review all of the medicines you are taking, including over-the-counter medicines. Ask your health care provider about any side effects that may make you more likely to have a fall. Tell your health care provider if any medicines that you take make you feel dizzy or sleepy.  Osteoporosis screening. Osteoporosis is a condition that causes the bones to get weaker. This can make the bones weak and cause them to break more easily.  Blood pressure screening. Blood pressure changes and medicines to control blood pressure can make you feel dizzy.  Strength and balance checks. Your health care provider may recommend certain tests to check your  strength and balance while standing, walking, or changing positions.  Foot health exam. Foot pain and numbness, as well as not wearing proper footwear, can make you more likely to have a fall.  Depression screening. You may be more likely to have a fall if you have a fear of falling, feel emotionally low, or feel unable to do activities that you used to do.  Alcohol use screening. Using too much alcohol can affect your balance and may make you more likely to have a fall. What actions can I take to lower my risk of falls? General instructions  Talk with your health care provider about your risks for falling. Tell your health care provider if: ? You fall. Be sure to tell your health care provider about all falls, even ones that seem minor. ? You feel dizzy, sleepy, or off-balance.  Take over-the-counter and prescription medicines only as told by your health care provider. These include any supplements.  Eat a healthy diet and maintain a healthy weight. A healthy diet includes low-fat dairy products, low-fat (lean) meats, and fiber from whole grains, beans, and lots of fruits and vegetables. Home safety  Remove any tripping hazards, such as rugs, cords, and clutter.  Install safety equipment such as grab bars in bathrooms and safety rails on stairs.  Keep rooms and walkways well-lit. Activity   Follow a regular exercise program to stay fit. This will help you maintain your balance. Ask your health care provider what types of exercise are appropriate for you.  If you need a cane or   walker, use it as recommended by your health care provider.  Wear supportive shoes that have nonskid soles. Lifestyle  Do not drink alcohol if your health care provider tells you not to drink.  If you drink alcohol, limit how much you have: ? 0-1 drink a day for women. ? 0-2 drinks a day for men.  Be aware of how much alcohol is in your drink. In the [place] one drink equals one typical bottle of beer (12  oz), one-half glass of wine (5 oz), or one shot of hard liquor (1 oz).  Do not use any products that contain nicotine or tobacco, such as cigarettes and e-cigarettes. If you need help quitting, ask your health care provider. Summary  Having a healthy lifestyle and getting preventive care can help to protect your health and wellness after age 67.  Screening and testing are the best way to find a health problem early and help you avoid having a fall. Early diagnosis and treatment give you the best chance for managing medical conditions that are more common for people who are older than age 67.  Falls are a major cause of broken bones and head injuries in people who are older than age 67. Take precautions to prevent a fall at home.  Work with your health care provider to learn what changes you can make to improve your health and wellness and to prevent falls. This information is not intended to replace advice given to you by your health care provider. Make sure you discuss any questions you have with your health care provider. Document Released: 08/13/2017 Document Revised: 01/21/2019 Document Reviewed: 08/13/2017 Elsevier Patient Education  2020 Elsevier Inc.  

## 2019-07-26 NOTE — Progress Notes (Signed)
Subjective:    Patient ID: Jamie [name], female    DOB: 1951/04/21, 68 y.o.   MRN: [id number]  Chief Complaint  Patient presents with  . Medical Management of Chronic Issues   PT presents to the office today for chronic follow up.PT had surgical decompression and stabilization from L2-L4 on 02/05/17. Pt states her back continues to hurtand her pain is a6-7out 10 when she walks or standing Diabetes She presents for her follow-up diabetic visit. She has type 2 diabetes mellitus. Her disease course has been stable. There are no hypoglycemic associated symptoms. Associated symptoms include foot paresthesias. Pertinent negatives for diabetes include no blurred vision. There are no hypoglycemic complications. Symptoms are stable. Diabetic complications include heart disease. Risk factors for coronary artery disease include dyslipidemia, diabetes mellitus, hypertension and post-menopausal. She is following a generally unhealthy diet. Her overall blood glucose range is 140-180 mg/dl. Eye exam is current.  Back Pain This is a chronic problem. The current episode started more than 1 year ago. The problem occurs intermittently. The pain is present in the lumbar spine. The quality of the pain is described as aching. The pain is at a severity of 8/10. The pain is moderate. Associated symptoms include leg pain. Pertinent negatives include no bladder incontinence or bowel incontinence. She has tried bed rest for the symptoms. The treatment provided mild relief.  Nicotine Dependence Presents for follow-up visit. Her urge triggers include company of smokers. She smokes 1 pack of cigarettes per day.  Hyperlipidemia This is a chronic problem. The current episode started more than 1 year ago. The problem is controlled. Recent lipid tests were reviewed and are normal. Associated symptoms include leg pain. Current antihyperlipidemic treatment includes statins. The current treatment provides moderate improvement  of lipids. Risk factors for coronary artery disease include dyslipidemia, hypertension, a sedentary lifestyle and post-menopausal.  Gastroesophageal Reflux She complains of nausea. She reports no belching or no heartburn. This is a chronic problem. The current episode started more than 1 year ago. The problem occurs occasionally. She has tried a PPI for the symptoms. The treatment provided moderate relief.  COPD Pt continues to smoke a pack a day. Has intermittent SOB, but is stable.    Review of Systems  Eyes: Negative for blurred vision.  Gastrointestinal: Positive for nausea. Negative for bowel incontinence and heartburn.  Genitourinary: Negative for bladder incontinence.  Musculoskeletal: Positive for back pain.  All other systems reviewed and are negative.      Objective:   Physical Exam Vitals signs reviewed.  Constitutional:      General: She is not in acute distress.    Appearance: She is well-developed.  HENT:     Head: Normocephalic and atraumatic.     Right Ear: Tympanic membrane normal.     Left Ear: Tympanic membrane normal.  Eyes:     Pupils: Pupils are equal, round, and reactive to light.  Neck:     Musculoskeletal: Normal range of motion and neck supple.     Thyroid: No thyromegaly.  Cardiovascular:     Rate and Rhythm: Normal rate and regular rhythm.     Heart sounds: Normal heart sounds. No murmur.  Pulmonary:     Effort: Pulmonary effort is normal. No respiratory distress.     Breath sounds: Normal breath sounds. No wheezing.  Abdominal:     General: Bowel sounds are normal. There is no distension.     Palpations: Abdomen is soft.     Tenderness: There is  no abdominal tenderness.  Musculoskeletal: Normal range of motion.        General: No tenderness.  Skin:    General: Skin is warm and dry.  Neurological:     Mental Status: She is alert and oriented to person, place, and time.     Cranial Nerves: No cranial nerve deficit.     Deep Tendon Reflexes:  Reflexes are normal and symmetric.  Psychiatric:        Behavior: Behavior normal.        Thought Content: Thought content normal.        Judgment: Judgment normal.       BP (!) 152/77   Pulse 72   Temp (!) 96.8 F (36 C) (Temporal)   Ht '5\' 1"'  (1.549 m)   Wt 95 lb 3.2 oz (43.2 kg)   SpO2 97%   BMI 17.99 kg/m      Assessment & Plan:  Jamie [name] comes in today with chief complaint of Medical Management of Chronic Issues   Diagnosis and orders addressed:  1. Essential hypertension - amLODipine (NORVASC) 5 MG tablet; Take 1 tablet (5 mg total) by mouth daily.  Dispense: 90 tablet; Refill: 1 - losartan (COZAAR) 100 MG tablet; Take 1 tablet (100 mg total) by mouth daily.  Dispense: 90 tablet; Refill: 1 - CMP14+EGFR - CBC with Differential/Platelet  2. Diabetes mellitus with complication (HCC) - Insulin Glargine (LANTUS SOLOSTAR) 100 UNIT/ML Solostar Pen; Inject 20-25 Units into the skin daily.  Dispense: 15 mL; Refill: 3 - Bayer DCA Hb A1c Waived - CMP14+EGFR - CBC with Differential/Platelet - Microalbumin / creatinine urine ratio  3. Diabetes mellitus due to underlying condition, uncontrolled, with hyperglycemia, without long-term current use of insulin (HCC) - Insulin Glargine (LANTUS SOLOSTAR) 100 UNIT/ML Solostar Pen; Inject 20-25 Units into the skin daily.  Dispense: 15 mL; Refill: 3 - Bayer DCA Hb A1c Waived - CMP14+EGFR - CBC with Differential/Platelet - TSH - Microalbumin / creatinine urine ratio  4. Moderate COPD (chronic obstructive pulmonary disease) (HCC) - CMP14+EGFR - CBC with Differential/Platelet - TSH  5. COPD with acute exacerbation (HCC) - CMP14+EGFR - CBC with Differential/Platelet  6. Gastroesophageal reflux disease without esophagitis - CMP14+EGFR - CBC with Differential/Platelet  7. Hyperlipidemia associated with type 2 diabetes mellitus (HCC) - pravastatin (PRAVACHOL) 20 MG tablet; Take 1 tablet (20 mg total) by mouth daily.   Dispense: 90 tablet; Refill: 1 - CMP14+EGFR - CBC with Differential/Platelet  8. Bilateral hearing loss, unspecified hearing loss type - CMP14+EGFR - CBC with Differential/Platelet  9. Lumbar radiculopathy, chronic - CMP14+EGFR - CBC with Differential/Platelet  10. Underweight - CMP14+EGFR - CBC with Differential/Platelet - TSH  11. Smoking Smoking cessation discussed - CMP14+EGFR - CBC with Differential/Platelet  12. Chronic bilateral low back pain with right-sided sciatica - CMP14+EGFR - CBC with Differential/Platelet  13. Coronary artery calcification - CMP14+EGFR - CBC with Differential/Platelet  14. Vitamin D deficiency - CMP14+EGFR - CBC with Differential/Platelet - VITAMIN D 25 Hydroxy (Vit-D Deficiency, Fractures)   Labs pending Health Maintenance reviewed Diet and exercise encouraged  Follow up plan: 3 months    Evelina Dun, FNP

## 2019-07-27 LAB — CMP14+EGFR
ALT: 13 IU/L (ref 0–32)
AST: 12 IU/L (ref 0–40)
Albumin/Globulin Ratio: 1.7 (ref 1.2–2.2)
Albumin: 4 g/dL (ref 3.8–4.8)
Alkaline Phosphatase: 117 IU/L (ref 39–117)
BUN/Creatinine Ratio: 16 (ref 12–28)
BUN: 13 mg/dL (ref 8–27)
Bilirubin Total: 0.4 mg/dL (ref 0.0–1.2)
CO2: 22 mmol/L (ref 20–29)
Calcium: 9.4 mg/dL (ref 8.7–10.3)
Chloride: 100 mmol/L (ref 96–106)
Creatinine, Ser: 0.81 mg/dL (ref 0.57–1.00)
GFR calc Af Amer: 88 mL/min/{1.73_m2} (ref >59)
GFR calc non Af Amer: 76 mL/min/{1.73_m2} (ref >59)
Globulin, Total: 2.4 g/dL (ref 1.5–4.5)
Glucose: 159 mg/dL — ABNORMAL HIGH (ref 65–99)
Potassium: 4.2 mmol/L (ref 3.5–5.2)
Sodium: 138 mmol/L (ref 134–144)
Total Protein: 6.4 g/dL (ref 6.0–8.5)

## 2019-07-27 LAB — CBC WITH DIFFERENTIAL/PLATELET
Basophils Absolute: 0 10*3/uL (ref 0.0–0.2)
Basos: 1 %
EOS (ABSOLUTE): 0.1 10*3/uL (ref 0.0–0.4)
Eos: 2 %
Hematocrit: 37.7 % (ref 34.0–46.6)
Hemoglobin: 11.7 g/dL (ref 11.1–15.9)
Immature Grans (Abs): 0 10*3/uL (ref 0.0–0.1)
Immature Granulocytes: 0 %
Lymphocytes Absolute: 1.4 10*3/uL (ref 0.7–3.1)
Lymphs: 23 %
MCH: 24 pg — ABNORMAL LOW (ref 26.6–33.0)
MCHC: 31 g/dL — ABNORMAL LOW (ref 31.5–35.7)
MCV: 77 fL — ABNORMAL LOW (ref 79–97)
Monocytes Absolute: 0.6 10*3/uL (ref 0.1–0.9)
Monocytes: 9 %
Neutrophils Absolute: 4 10*3/uL (ref 1.4–7.0)
Neutrophils: 65 %
Platelets: 223 10*3/uL (ref 150–450)
RBC: 4.87 x10E6/uL (ref 3.77–5.28)
RDW: 15.8 % — ABNORMAL HIGH (ref 11.7–15.4)
WBC: 6.2 10*3/uL (ref 3.4–10.8)

## 2019-07-27 LAB — MICROALBUMIN / CREATININE URINE RATIO
Creatinine, Urine: 60.5 mg/dL
Microalb/Creat Ratio: 16 mg/g creat (ref 0–29)
Microalbumin, Urine: 9.8 ug/mL

## 2019-07-27 LAB — TSH: TSH: 0.896 u[IU]/mL (ref 0.450–4.500)

## 2019-07-27 LAB — VITAMIN D 25 HYDROXY (VIT D DEFICIENCY, FRACTURES): Vit D, 25-Hydroxy: 28 ng/mL — ABNORMAL LOW (ref 30.0–100.0)

## 2019-07-29 ENCOUNTER — Other Ambulatory Visit: Payer: Self-pay | Admitting: Family

## 2019-07-29 DIAGNOSIS — E0865 Diabetes mellitus due to underlying condition with hyperglycemia: Secondary | ICD-10-CM

## 2019-07-29 DIAGNOSIS — E118 Type 2 diabetes mellitus with unspecified complications: Secondary | ICD-10-CM

## 2019-07-29 MED ORDER — JARDIANCE 10 MG PO TABS: 10.0000 mg | ORAL_TABLET | Freq: Every day | ORAL | 3 refills | Status: DC

## 2019-07-29 MED ORDER — LANTUS SOLOSTAR 100 UNIT/ML ~~LOC~~ SOPN: 30.0000 [IU] | PEN_INJECTOR | Freq: Every day | SUBCUTANEOUS | 3 refills | Status: DC

## 2019-08-02 ENCOUNTER — Other Ambulatory Visit: Payer: Self-pay | Admitting: *Deleted

## 2019-08-02 DIAGNOSIS — E0865 Diabetes mellitus due to underlying condition with hyperglycemia: Secondary | ICD-10-CM

## 2019-08-02 DIAGNOSIS — E118 Type 2 diabetes mellitus with unspecified complications: Secondary | ICD-10-CM

## 2019-08-02 DIAGNOSIS — E1165 Type 2 diabetes mellitus with hyperglycemia: Secondary | ICD-10-CM

## 2019-08-02 MED ORDER — ACCU-CHEK FASTCLIX LANCETS MISC: 3 refills | Status: DC

## 2019-08-02 MED ORDER — ACCU-CHEK AVIVA VI SOLN: 0 refills | Status: DC

## 2019-08-02 MED ORDER — PEN NEEDLES 32G X 4 MM MISC: 1.0000 | Freq: Every day | 3 refills | Status: DC

## 2019-08-02 MED ORDER — ACCU-CHEK AVIVA PLUS VI STRP: ORAL_STRIP | 3 refills | Status: DC

## 2019-08-02 MED ORDER — BD SWAB SINGLE USE REGULAR PADS: MEDICATED_PAD | 3 refills | Status: DC

## 2019-08-02 MED ORDER — ACCU-CHEK AVIVA PLUS W/DEVICE KIT: PACK | 0 refills | Status: DC

## 2019-08-02 NOTE — Addendum Note (Signed)
Addended by: Antonietta Barcelona D on: 08/02/2019 03:29 PM   Modules accepted: Orders

## 2019-08-03 NOTE — Addendum Note (Signed)
Addended by: Evelina Dun A on: 08/03/2019 01:33 PM   Modules accepted: Orders

## 2019-08-04 NOTE — Addendum Note (Signed)
Addended by: Evelina Dun A on: 08/04/2019 02:40 PM   Modules accepted: Orders

## 2019-08-06 ENCOUNTER — Telehealth: Payer: Self-pay | Admitting: Family

## 2019-08-06 DIAGNOSIS — E1169 Type 2 diabetes mellitus with other specified complication: Secondary | ICD-10-CM

## 2019-08-06 DIAGNOSIS — I10 Essential (primary) hypertension: Secondary | ICD-10-CM

## 2019-08-06 DIAGNOSIS — E785 Hyperlipidemia, unspecified: Secondary | ICD-10-CM

## 2019-08-06 MED ORDER — LOSARTAN POTASSIUM 100 MG PO TABS: 100.0000 mg | ORAL_TABLET | Freq: Every day | ORAL | 1 refills | Status: DC

## 2019-08-06 MED ORDER — PRAVASTATIN SODIUM 20 MG PO TABS: 20.0000 mg | ORAL_TABLET | Freq: Every day | ORAL | 1 refills | Status: DC

## 2019-08-06 MED ORDER — AMLODIPINE BESYLATE 5 MG PO TABS: 5.0000 mg | ORAL_TABLET | Freq: Every day | ORAL | 1 refills | Status: DC

## 2019-08-06 NOTE — Telephone Encounter (Signed)
Pt aware refills sent 

## 2019-08-10 ENCOUNTER — Encounter: Payer: Self-pay | Admitting: *Deleted

## 2019-08-10 ENCOUNTER — Ambulatory Visit (INDEPENDENT_AMBULATORY_CARE_PROVIDER_SITE_OTHER): Payer: Medicare HMO | Admitting: "Endocrinology

## 2019-08-10 ENCOUNTER — Encounter: Payer: Self-pay | Admitting: "Endocrinology

## 2019-08-10 ENCOUNTER — Other Ambulatory Visit: Payer: Self-pay

## 2019-08-10 VITALS — BP 130/85 | HR 93 | Ht 61.0 in | Wt 95.0 lb

## 2019-08-10 DIAGNOSIS — E782 Mixed hyperlipidemia: Secondary | ICD-10-CM | POA: Diagnosis not present

## 2019-08-10 DIAGNOSIS — I10 Essential (primary) hypertension: Secondary | ICD-10-CM

## 2019-08-10 DIAGNOSIS — E1159 Type 2 diabetes mellitus with other circulatory complications: Secondary | ICD-10-CM | POA: Diagnosis not present

## 2019-08-10 DIAGNOSIS — F17200 Nicotine dependence, unspecified, uncomplicated: Secondary | ICD-10-CM | POA: Diagnosis not present

## 2019-08-10 NOTE — Progress Notes (Signed)
Endocrinology Consult Note       08/10/2019, 3:28 PM   Subjective:    Patient ID: Jamie [name], female    DOB: 1951/07/23.  Jamie [name] is being seen in consultation for management of currently uncontrolled symptomatic diabetes requested by  Sharion Balloon, FNP.   Past Medical History:  Diagnosis Date  . Anxiety   . Breast cancer ([place])   . COPD (chronic obstructive pulmonary disease) ([place])   . Deaf   . Depression   . Diabetes ([place])   . HOH (hard of hearing)   . HTN (hypertension)   . Hyperlipidemia   . Personal history of radiation therapy   . Vitamin D deficiency     Past Surgical History:  Procedure Laterality Date  . APPLICATION OF ROBOTIC ASSISTANCE FOR SPINAL PROCEDURE N/A 02/23/2019   Procedure: APPLICATION OF ROBOTIC ASSISTANCE FOR SPINAL PROCEDURE;  Surgeon: Kristeen Miss, MD;  Location: [facility];  Service: Neurosurgery;  Laterality: N/A;  . BACK SURGERY    . BREAST SURGERY     radiation  . CARPAL TUNNEL RELEASE    . CERVICAL SPINE SURGERY    . EYE SURGERY Bilateral    cataracts  . GALLBLADDER SURGERY    . INNER EAR SURGERY    . POSTERIOR LUMBAR FUSION 4 LEVEL N/A 02/23/2019   Procedure: Thoracic ten to Lumbar two decompression with Posterior lumbar interbody fusion lumbar one-lumbar two with Osteotomy at Lumbar one-Lumbar two, Posterior arthrodesis Thoracic ten to Lumbar two with Mazor;  Surgeon: Kristeen Miss, MD;  Location: [facility];  Service: Neurosurgery;  Laterality: N/A;  . SPINE SURGERY      Social History   Socioeconomic History  . Marital status: Married    Spouse name: Not on file  . Number of children: 1  . Years of education: Not on file  . Highest education level: High school graduate  Occupational History  . Occupation: disability  Social Needs  . Financial resource strain: Not very hard  . Food insecurity    Worry: Never true    Inability: Never true  .  Transportation needs    Medical: No    Non-medical: No  Tobacco Use  . Smoking status: Current Every Day Smoker    Packs/day: 1.00    Years: 51.00    Pack years: 51.00    Types: Cigarettes  . Smokeless tobacco: Never Used  Substance and Sexual Activity  . Alcohol use: No  . Drug use: No  . Sexual activity: Not on file  Lifestyle  . Physical activity    Days per week: 0 days    Minutes per session: 0 min  . Stress: Not at all  Relationships  . Social connections    Talks on phone: More than three times a week    Gets together: More than three times a week    Attends religious service: Never    Active member of club or organization: No    Attends meetings of clubs or organizations: Never    Relationship status: Married  Other Topics Concern  . Not on file  Social History Narrative  Retired/disabled. Lives at home with her husband. He still works part-time. Currently following social distancing guidelines.     Family History  Problem Relation Age of Onset  . Heart attack Mother   . Stroke Mother   . Stroke Sister   . Diabetes Other        sister x3, and mother  . Lung disease Father        worked in [employer]  . Colon cancer Neg Hx   . Colon polyps Neg Hx     Outpatient Encounter Medications as of 08/10/2019  Medication Sig  . amLODipine (NORVASC) 5 MG tablet Take 1 tablet (5 mg total) by mouth daily.  . Insulin Glargine (LANTUS) 100 UNIT/ML Solostar Pen Inject 30 Units into the skin at bedtime.  Marland Kitchen losartan (COZAAR) 100 MG tablet Take 1 tablet (100 mg total) by mouth daily.  . pravastatin (PRAVACHOL) 20 MG tablet Take 1 tablet (20 mg total) by mouth daily.  . [DISCONTINUED] Insulin Glargine (LANTUS SOLOSTAR) 100 UNIT/ML Solostar Pen Inject 30 Units into the skin daily.  . Accu-Chek FastClix Lancets MISC Use to check blood sugar two times daily  Dx Code E11.9 (Patient not taking: Reported on 08/10/2019)  . albuterol (PROVENTIL) (2.5 MG/3ML) 0.083% nebulizer  solution Take 3 mLs (2.5 mg total) by nebulization every 6 (six) hours as needed for wheezing or shortness of breath.  . Alcohol Swabs (B-D SINGLE USE SWABS REGULAR) PADS Test BS BID Dx E11.9  . Blood Glucose Calibration (ACCU-CHEK AVIVA) SOLN Use with glucose machine Dx E11.9  . Blood Glucose Monitoring Suppl (ACCU-CHEK AVIVA PLUS) w/Device KIT Check BS BID Dx E11.9  . glucose blood (ACCU-CHEK AVIVA PLUS) test strip Check BS BID Dx E11.9  . Insulin Pen Needle (PEN NEEDLES) 32G X 4 MM MISC Inject 1 applicator into the skin daily. Dx E11.9  . methocarbamol (ROBAXIN) 500 MG tablet Take 1 tablet (500 mg total) by mouth every 6 (six) hours as needed for muscle spasms. (Patient not taking: Reported on 08/10/2019)  . [DISCONTINUED] empagliflozin (JARDIANCE) 10 MG TABS tablet Take 10 mg by mouth daily before breakfast. (Patient not taking: Reported on 08/10/2019)   No facility-administered encounter medications on file as of 08/10/2019.     ALLERGIES: Allergies  Allergen Reactions  . Lipitor [Atorvastatin] Other (See Comments)    Constipation   . Soma [Carisoprodol] Nausea And Vomiting    VACCINATION STATUS: Immunization History  Administered Date(s) Administered  . Fluad Quad(high Dose 65+) 07/26/2019  . Influenza Split 08/14/2012, 07/17/2013  . Influenza, High Dose Seasonal PF 07/28/2018  . Influenza,inj,Quad PF,6+ Mos 07/11/2014, 07/14/2015, 07/15/2016, 07/22/2017  . Pneumococcal Conjugate-13 02/13/2015  . Pneumococcal Polysaccharide-23 10/14/2002, 01/29/2018  . Zoster Recombinat (Shingrix) 09/16/2018    Diabetes She presents for her initial diabetic visit. She has type 2 diabetes mellitus. Her disease course has been worsening. There are no hypoglycemic associated symptoms. Pertinent negatives for hypoglycemia include no confusion, headaches, pallor or seizures. Associated symptoms include fatigue, polydipsia and polyuria. Pertinent negatives for diabetes include no chest pain and no  polyphagia. There are no hypoglycemic complications. Symptoms are worsening. Diabetic complications include heart disease and peripheral neuropathy. Risk factors for coronary artery disease include diabetes mellitus, dyslipidemia, hypertension, tobacco exposure, sedentary lifestyle and post-menopausal. Current diabetic treatment includes insulin injections. Her weight is fluctuating minimally. She is following a generally unhealthy diet. When asked about meal planning, she reported none. She has not had a previous visit with a dietitian. She rarely participates  in exercise. (She did not bring any logs nor meter to review.  Previsit A1c was 8.5%) An ACE inhibitor/angiotensin II receptor blocker is not being taken. Eye exam is current.  Hyperlipidemia This is a chronic problem. The current episode started more than 1 year ago. The problem is controlled. Exacerbating diseases include diabetes. Pertinent negatives include no chest pain, myalgias or shortness of breath. Current antihyperlipidemic treatment includes statins. Risk factors for coronary artery disease include dyslipidemia, diabetes mellitus, hypertension, post-menopausal and a sedentary lifestyle.  Hypertension This is a chronic problem. The current episode started more than 1 year ago. Pertinent negatives include no chest pain, headaches, palpitations or shortness of breath. Risk factors for coronary artery disease include dyslipidemia, diabetes mellitus, sedentary lifestyle and smoking/tobacco exposure. Past treatments include calcium channel blockers.     Review of Systems  Constitutional: Positive for fatigue. Negative for chills, fever and unexpected weight change.  HENT: Negative for trouble swallowing and voice change.   Eyes: Negative for visual disturbance.  Respiratory: Negative for cough, shortness of breath and wheezing.   Cardiovascular: Negative for chest pain, palpitations and leg swelling.  Gastrointestinal: Negative for  diarrhea, nausea and vomiting.  Endocrine: Positive for polydipsia and polyuria. Negative for cold intolerance, heat intolerance and polyphagia.  Musculoskeletal: Negative for arthralgias and myalgias.  Skin: Negative for color change, pallor, rash and wound.  Neurological: Negative for seizures and headaches.  Psychiatric/Behavioral: Negative for confusion and suicidal ideas.    Objective:    BP 130/85   Pulse 93   Ht '5\' 1"'$  (1.549 m)   Wt 95 lb (43.1 kg)   BMI 17.95 kg/m   Wt Readings from Last 3 Encounters:  08/10/19 95 lb (43.1 kg)  07/26/19 95 lb 3.2 oz (43.2 kg)  04/20/19 88 lb 6.4 oz (40.1 kg)     Physical Exam Constitutional:      Appearance: She is well-developed.  HENT:     Head: Normocephalic and atraumatic.  Neck:     Musculoskeletal: Normal range of motion and neck supple.     Thyroid: No thyromegaly.     Trachea: No tracheal deviation.  Pulmonary:     Effort: Pulmonary effort is normal.     Breath sounds: Wheezing present.  Abdominal:     Tenderness: There is no abdominal tenderness. There is no guarding.  Musculoskeletal: Normal range of motion.  Skin:    General: Skin is warm and dry.     Coloration: Skin is not pale.     Findings: No erythema or rash.  Neurological:     Mental Status: She is alert and oriented to person, place, and time.     Cranial Nerves: No cranial nerve deficit.     Coordination: Coordination normal.     Deep Tendon Reflexes: Reflexes are normal and symmetric.  Psychiatric:     Comments: Reluctant affect.      CMP ( most recent) CMP     Component Value Date/Time   NA 138 07/26/2019 0921   K 4.2 07/26/2019 0921   CL 100 07/26/2019 0921   CO2 22 07/26/2019 0921   GLUCOSE 159 (H) 07/26/2019 0921   GLUCOSE 203 (H) 02/24/2019 0209   BUN 13 07/26/2019 0921   CREATININE 0.81 07/26/2019 0921   CALCIUM 9.4 07/26/2019 0921   PROT 6.4 07/26/2019 0921   ALBUMIN 4.0 07/26/2019 0921   AST 12 07/26/2019 0921   ALT 13 07/26/2019  0921   ALKPHOS 117 07/26/2019 0921   BILITOT 0.4  07/26/2019 0921   GFRNONAA 76 07/26/2019 0921   GFRAA 88 07/26/2019 0921     Diabetic Labs (most recent): Lab Results  Component Value Date   HGBA1C 8.3 (H) 07/26/2019   HGBA1C 7.8 (H) 04/20/2019   HGBA1C 8.1 (H) 02/17/2019     Lipid Panel ( most recent) Lipid Panel     Component Value Date/Time   CHOL 155 04/20/2019 1134   TRIG 97 04/20/2019 1134   TRIG 72 05/14/2013 1251   HDL 63 04/20/2019 1134   HDL 49 05/14/2013 1251   CHOLHDL 2.5 04/20/2019 1134   LDLCALC 73 04/20/2019 1134   LDLCALC 96 05/14/2013 1251   LABVLDL 19 04/20/2019 1134      Lab Results  Component Value Date   TSH 0.896 07/26/2019   TSH 0.787 12/14/2018   TSH 0.651 01/29/2018   TSH 0.476 02/13/2015      Assessment & Plan:   1. Diabetes mellitus with complication ([place])  - Jamie [name] has currently uncontrolled symptomatic type 2 DM since  68 years of age,  with most recent A1c of 8.3 %. Recent labs reviewed. - I had a long discussion with her about the progressive nature of diabetes and the pathology behind its complications. -her diabetes is complicated by coronary artery disease, chronic active smoking, and she remains at a high risk for more acute and chronic complications which include CAD, CVA, CKD, retinopathy, and neuropathy. These are all discussed in detail with her.  - I have counseled her on diet  by adopting a carbohydrate restricted/protein rich diet. Patient is encouraged to switch to  unprocessed or minimally processed     complex starch and increased protein intake (animal or plant source), fruits, and vegetables. -  she is advised to stick to a routine mealtimes to eat 3 meals  a day and avoid unnecessary snacks ( to snack only to correct hypoglycemia).   - she admits that there is a room for improvement in her food and drink choices. - Suggestion is made for her to avoid simple carbohydrates  from her diet including Cakes, Sweet  Desserts, Ice Cream, Soda (diet and regular), Sweet Tea, Candies, Chips, Cookies, Store Bought Juices, Alcohol in Excess of  1-2 drinks a day, Artificial Sweeteners,  Coffee Creamer, and "Sugar-free" Products. This will help patient to have more stable blood glucose profile and potentially avoid unintended weight gain.  - she will be scheduled with Jearld Fenton, RDN, CDE for diabetes education.  - I have approached her with the following individualized plan to manage  her diabetes and patient agrees:   - she wishes to switch back to Novolin treatment because she did better on this regimen than her current Lantus.   -She will be considered for Novolin 70/30 after she uses up her current supply of Lantus.   -Accordingly, she is approached with adjusting her Lantus to 30 units nightly associated with strict monitoring of glucose 4 times a day-before meals and at bedtime and revisit in 10 days over the phone. - she is warned not to take insulin without proper monitoring per orders.  - she is encouraged to call clinic for blood glucose levels less than 70 or above 300 mg /dl. - she is not a candidate for metformin, SGLT2 inhibitors, nor incretin therapy due to her body habitus.   - Specific targets for  A1c;  LDL, HDL, Triglycerides, and  Waist Circumference were discussed with the patient.  2) Blood Pressure /Hypertension:  her  blood pressure is  controlled to target.   she is advised to continue her current medications including losartan 100 mg p.o. daily with breakfast as well as amlodipine 5 mg p.o. daily at breakfast. 3) Lipids/Hyperlipidemia:   Review of her recent lipid panel showed  controlled  LDL at 73 .  she  is advised to continue    pravastatin 20 mg daily at bedtime.  Side effects and precautions discussed with her.  4)  Weight/Diet:  Body mass index is 17.95 kg/m.  -Is not a candidate for weight loss.   Exercise, and detailed carbohydrates information provided  -  detailed on  discharge instructions.  5) Chronic Care/Health Maintenance:  -she  is on ACEI/ARB and Statin medications and  is encouraged to initiate and continue to follow up with Ophthalmology, Dentist,  Podiatrist at least yearly or according to recommendations, and advised to  quit stay away from smoking. I have recommended yearly flu vaccine and pneumonia vaccine at least every 5 years; and  sleep for at least 7 hours a day. -She is extensively counseled for smoking cessation.  - she is  advised to maintain close follow up with Sharion Balloon, FNP for primary care needs, as well as her other providers for optimal and coordinated care.  - Time spent with the patient: 45 minutes, of which >50% was spent in obtaining information about her symptoms, reviewing her previous labs/studies, evaluations, and treatments, counseling her about her currently uncontrolled type 2 diabetes, hyperlipidemia, hypertension, smoking, and developing plans for long term treatment based on the latest standards of care/guidelines.  Please refer to " Patient Self Inventory" in the Media  tab for reviewed elements of pertinent patient history.  Paulita Fujita participated in the discussions, expressed understanding, and voiced agreement with the above plans.  All questions were answered to her satisfaction. she is encouraged to contact clinic should she have any questions or concerns prior to her return visit.  Follow up plan: - Return in about 10 days (around 08/20/2019) for Follow up with Meter and Logs Only - no Labs.  Glade Lloyd, MD [facility] [address] [place], [place] [address] Phone: [phone]  Fax: [phone]    08/10/2019, 3:28 PM  This note was partially dictated with voice recognition software. Similar sounding words can be transcribed inadequately or may not  be corrected upon review.

## 2019-08-11 ENCOUNTER — Telehealth: Payer: Medicare HMO | Admitting: *Deleted

## 2019-08-18 ENCOUNTER — Ambulatory Visit: Payer: Medicare HMO | Admitting: *Deleted

## 2019-08-24 ENCOUNTER — Ambulatory Visit (INDEPENDENT_AMBULATORY_CARE_PROVIDER_SITE_OTHER): Payer: Medicare HMO | Admitting: "Endocrinology

## 2019-08-24 ENCOUNTER — Other Ambulatory Visit: Payer: Self-pay

## 2019-08-24 ENCOUNTER — Encounter: Payer: Self-pay | Admitting: "Endocrinology

## 2019-08-24 DIAGNOSIS — F17200 Nicotine dependence, unspecified, uncomplicated: Secondary | ICD-10-CM | POA: Diagnosis not present

## 2019-08-24 DIAGNOSIS — E1159 Type 2 diabetes mellitus with other circulatory complications: Secondary | ICD-10-CM | POA: Diagnosis not present

## 2019-08-24 DIAGNOSIS — E782 Mixed hyperlipidemia: Secondary | ICD-10-CM

## 2019-08-24 DIAGNOSIS — I10 Essential (primary) hypertension: Secondary | ICD-10-CM | POA: Diagnosis not present

## 2019-08-24 NOTE — Progress Notes (Signed)
08/24/2019, 12:33 PM                                                    Endocrinology Telehealth Visit Follow up Note -During COVID -19 Pandemic  This visit type was conducted due to national recommendations for restrictions regarding the COVID-19 Pandemic  in an effort to limit this patient's exposure and mitigate transmission of the corona virus.  Due to her co-morbid illnesses, Jamie [name] is at  moderate to high risk for complications without adequate follow up.  This format is felt to be most appropriate for her at this time.  I connected with this patient on 08/24/2019   by telephone and verified that I am speaking with the correct person using two identifiers. Jamie [name], [birth year]. she has verbally consented to this visit. All issues noted in this document were discussed and addressed. The format was not optimal for physical exam.    Subjective:    Patient ID: Jamie [name], female    DOB: 07/17/1951.  Jamie [name] is being engaged in telehealth via telephone after she was seen in consultation for management of currently uncontrolled symptomatic diabetes requested by  Sharion Balloon, FNP.   Past Medical History:  Diagnosis Date  . Anxiety   . Breast cancer ([place])   . COPD (chronic obstructive pulmonary disease) ([place])   . Deaf   . Depression   . Diabetes ([place])   . HOH (hard of hearing)   . HTN (hypertension)   . Hyperlipidemia   . Personal history of radiation therapy   . Vitamin D deficiency     Past Surgical History:  Procedure Laterality Date  . APPLICATION OF ROBOTIC ASSISTANCE FOR SPINAL PROCEDURE N/A 02/23/2019   Procedure: APPLICATION OF ROBOTIC ASSISTANCE FOR SPINAL PROCEDURE;  Surgeon: Kristeen Miss, MD;  Location: [facility];  Service: Neurosurgery;  Laterality: N/A;  . BACK SURGERY    . BREAST SURGERY     radiation  . CARPAL TUNNEL RELEASE    . CERVICAL SPINE SURGERY    . EYE SURGERY  Bilateral    cataracts  . GALLBLADDER SURGERY    . INNER EAR SURGERY    . POSTERIOR LUMBAR FUSION 4 LEVEL N/A 02/23/2019   Procedure: Thoracic ten to Lumbar two decompression with Posterior lumbar interbody fusion lumbar one-lumbar two with Osteotomy at Lumbar one-Lumbar two, Posterior arthrodesis Thoracic ten to Lumbar two with Mazor;  Surgeon: Kristeen Miss, MD;  Location: [facility];  Service: Neurosurgery;  Laterality: N/A;  . SPINE SURGERY      Social History   Socioeconomic History  . Marital status: Married    Spouse name: Not on file  . Number of children: 1  . Years of education: Not on file  . Highest education level: High school graduate  Occupational History  . Occupation: disability  Social Needs  . Financial resource strain: Not very hard  . Food insecurity    Worry: Never true    Inability: Never true  . Transportation needs  Medical: No    Non-medical: No  Tobacco Use  . Smoking status: Current Every Day Smoker    Packs/day: 1.00    Years: 51.00    Pack years: 51.00    Types: Cigarettes  . Smokeless tobacco: Never Used  Substance and Sexual Activity  . Alcohol use: No  . Drug use: No  . Sexual activity: Not on file  Lifestyle  . Physical activity    Days per week: 0 days    Minutes per session: 0 min  . Stress: Not at all  Relationships  . Social connections    Talks on phone: More than three times a week    Gets together: More than three times a week    Attends religious service: Never    Active member of club or organization: No    Attends meetings of clubs or organizations: Never    Relationship status: Married  Other Topics Concern  . Not on file  Social History Narrative   Retired/disabled. Lives at home with her husband. He still works part-time. Currently following social distancing guidelines.     Family History  Problem Relation Age of Onset  . Heart attack Mother   . Stroke Mother   . Stroke Sister   . Diabetes Other        sister  x3, and mother  . Lung disease Father        worked in [employer]  . Colon cancer Neg Hx   . Colon polyps Neg Hx     Outpatient Encounter Medications as of 08/24/2019  Medication Sig  . Accu-Chek FastClix Lancets MISC Use to check blood sugar two times daily  Dx Code E11.9 (Patient not taking: Reported on 08/10/2019)  . albuterol (PROVENTIL) (2.5 MG/3ML) 0.083% nebulizer solution Take 3 mLs (2.5 mg total) by nebulization every 6 (six) hours as needed for wheezing or shortness of breath.  . Alcohol Swabs (B-D SINGLE USE SWABS REGULAR) PADS Test BS BID Dx E11.9  . amLODipine (NORVASC) 5 MG tablet Take 1 tablet (5 mg total) by mouth daily.  . Blood Glucose Calibration (ACCU-CHEK AVIVA) SOLN Use with glucose machine Dx E11.9  . Blood Glucose Monitoring Suppl (ACCU-CHEK AVIVA PLUS) w/Device KIT Check BS BID Dx E11.9  . glucose blood (ACCU-CHEK AVIVA PLUS) test strip Check BS BID Dx E11.9  . Insulin Glargine (LANTUS) 100 UNIT/ML Solostar Pen Inject 26 Units into the skin daily with breakfast.  . Insulin Pen Needle (PEN NEEDLES) 32G X 4 MM MISC Inject 1 applicator into the skin daily. Dx E11.9  . losartan (COZAAR) 100 MG tablet Take 1 tablet (100 mg total) by mouth daily.  . methocarbamol (ROBAXIN) 500 MG tablet Take 1 tablet (500 mg total) by mouth every 6 (six) hours as needed for muscle spasms. (Patient not taking: Reported on 08/10/2019)  . pravastatin (PRAVACHOL) 20 MG tablet Take 1 tablet (20 mg total) by mouth daily.   No facility-administered encounter medications on file as of 08/24/2019.     ALLERGIES: Allergies  Allergen Reactions  . Lipitor [Atorvastatin] Other (See Comments)    Constipation   . Soma [Carisoprodol] Nausea And Vomiting    VACCINATION STATUS: Immunization History  Administered Date(s) Administered  . Fluad Quad(high Dose 65+) 07/26/2019  . Influenza Split 08/14/2012, 07/17/2013  . Influenza, High Dose Seasonal PF 07/28/2018  . Influenza,inj,Quad PF,6+ Mos  07/11/2014, 07/14/2015, 07/15/2016, 07/22/2017  . Pneumococcal Conjugate-13 02/13/2015  . Pneumococcal Polysaccharide-23 10/14/2002, 01/29/2018  . Zoster Recombinat (  Shingrix) 09/16/2018    Diabetes She presents for her follow-up diabetic visit. She has type 2 diabetes mellitus. Her disease course has been improving. There are no hypoglycemic associated symptoms. Pertinent negatives for hypoglycemia include no confusion, headaches, pallor or seizures. Associated symptoms include fatigue, polydipsia and polyuria. Pertinent negatives for diabetes include no chest pain and no polyphagia. There are no hypoglycemic complications. Symptoms are improving. Diabetic complications include heart disease and peripheral neuropathy. Risk factors for coronary artery disease include diabetes mellitus, dyslipidemia, hypertension, tobacco exposure, sedentary lifestyle and post-menopausal. Current diabetic treatment includes insulin injections. Her weight is fluctuating minimally. She is following a generally unhealthy diet. When asked about meal planning, she reported none. She has not had a previous visit with a dietitian. She rarely participates in exercise. (She reports fasting blood glucose ranging from 45-1 99, prelunch between 57- 214, presupper between 110-2 42, bedtime between 100-250 . Her recent previsit A1c was 8.3%) An ACE inhibitor/angiotensin II receptor blocker is not being taken. Eye exam is current.  Hyperlipidemia This is a chronic problem. The current episode started more than 1 year ago. The problem is controlled. Exacerbating diseases include diabetes. Pertinent negatives include no chest pain, myalgias or shortness of breath. Current antihyperlipidemic treatment includes statins. Risk factors for coronary artery disease include dyslipidemia, diabetes mellitus, hypertension, post-menopausal and a sedentary lifestyle.  Hypertension This is a chronic problem. The current episode started more than 1 year  ago. Pertinent negatives include no chest pain, headaches, palpitations or shortness of breath. Risk factors for coronary artery disease include dyslipidemia, diabetes mellitus, sedentary lifestyle and smoking/tobacco exposure. Past treatments include calcium channel blockers.   Review of systems: Limited as above.   Objective:    There were no vitals taken for this visit.  Wt Readings from Last 3 Encounters:  08/10/19 95 lb (43.1 kg)  07/26/19 95 lb 3.2 oz (43.2 kg)  04/20/19 88 lb 6.4 oz (40.1 kg)       CMP ( most recent) CMP     Component Value Date/Time   NA 138 07/26/2019 0921   K 4.2 07/26/2019 0921   CL 100 07/26/2019 0921   CO2 22 07/26/2019 0921   GLUCOSE 159 (H) 07/26/2019 0921   GLUCOSE 203 (H) 02/24/2019 0209   BUN 13 07/26/2019 0921   CREATININE 0.81 07/26/2019 0921   CALCIUM 9.4 07/26/2019 0921   PROT 6.4 07/26/2019 0921   ALBUMIN 4.0 07/26/2019 0921   AST 12 07/26/2019 0921   ALT 13 07/26/2019 0921   ALKPHOS 117 07/26/2019 0921   BILITOT 0.4 07/26/2019 0921   GFRNONAA 76 07/26/2019 0921   GFRAA 88 07/26/2019 0921     Diabetic Labs (most recent): Lab Results  Component Value Date   HGBA1C 8.3 (H) 07/26/2019   HGBA1C 7.8 (H) 04/20/2019   HGBA1C 8.1 (H) 02/17/2019     Lipid Panel ( most recent) Lipid Panel     Component Value Date/Time   CHOL 155 04/20/2019 1134   TRIG 97 04/20/2019 1134   TRIG 72 05/14/2013 1251   HDL 63 04/20/2019 1134   HDL 49 05/14/2013 1251   CHOLHDL 2.5 04/20/2019 1134   LDLCALC 73 04/20/2019 1134   LDLCALC 96 05/14/2013 1251   LABVLDL 19 04/20/2019 1134      Lab Results  Component Value Date   TSH 0.896 07/26/2019   TSH 0.787 12/14/2018   TSH 0.651 01/29/2018   TSH 0.476 02/13/2015      Assessment & Plan:   1.  Diabetes mellitus with complication ([place])  - PAOLINA KARWOWSKI has currently uncontrolled symptomatic type 2 DM since  68 years of age,  with most recent A1c of 8.3 %. Recent labs reviewed. She reports  fluctuating glycemic profile including some hypoglycemic episodes. - I had a long discussion with her about the progressive nature of diabetes and the pathology behind its complications. -her diabetes is complicated by coronary artery disease, chronic active smoking, and she remains at a high risk for more acute and chronic complications which include CAD, CVA, CKD, retinopathy, and neuropathy. These are all discussed in detail with her.  - I have counseled her on diet  by adopting a carbohydrate restricted/protein rich diet. Patient is encouraged to switch to  unprocessed or minimally processed     complex starch and increased protein intake (animal or plant source), fruits, and vegetables. -  she is advised to stick to a routine mealtimes to eat 3 meals  a day and avoid unnecessary snacks ( to snack only to correct hypoglycemia).   - she  admits there is a room for improvement in her diet and drink choices. -  Suggestion is made for her to avoid simple carbohydrates  from her diet including Cakes, Sweet Desserts / Pastries, Ice Cream, Soda (diet and regular), Sweet Tea, Candies, Chips, Cookies, Sweet Pastries,  Store Bought Juices, Alcohol in Excess of  1-2 drinks a day, Artificial Sweeteners, Coffee Creamer, and "Sugar-free" Products. This will help patient to have stable blood glucose profile and potentially avoid unintended weight gain.   - she will be scheduled with Jearld Fenton, RDN, CDE for diabetes education.  - I have approached her with the following individualized plan to manage  her diabetes and patient agrees:   - she wishes to switch back to Novolin treatment because she did better on this regimen than her current Lantus.   -She will be considered for Novolin 70/30 after she uses up her current supply of Lantus.   -Priority in this patient will be to avoid hypoglycemia.  She is advised to lower her Lantus to 26 units daily with breakfast, associated with strict monitoring of blood  glucose twice daily-before breakfast and at bedtime.    - she is warned not to take insulin without proper monitoring per orders.  - she is encouraged to call clinic for blood glucose levels less than 70 or above 200 mg /dl. - she is not a candidate for metformin, SGLT2 inhibitors, nor incretin therapy due to her body habitus.   - Specific targets for  A1c;  LDL, HDL, Triglycerides, and  Waist Circumference were discussed with the patient.  2) Blood Pressure /Hypertension:  she is advised to home monitor blood pressure and report if > 140/90 on 2 separate readings.    she is advised to continue her current medications including losartan 100 mg p.o. daily with breakfast as well as amlodipine 5 mg p.o. daily at breakfast.  3) Lipids/Hyperlipidemia:   Review of her recent lipid panel showed  controlled  LDL at 73 .  she  is advised to continue pravastatin 20 mg p.o. daily at bedtime.  Side effects and precautions discussed with her.  4)  Weight/Diet:  She Is not a candidate for weight loss.   Exercise, and detailed carbohydrates information provided  -  detailed on discharge instructions.  5) Chronic Care/Health Maintenance:  -she  is on ACEI/ARB and Statin medications and  is encouraged to initiate and continue to follow up with  Ophthalmology, Dentist,  Podiatrist at least yearly or according to recommendations, and advised to  quit stay away from smoking. I have recommended yearly flu vaccine and pneumonia vaccine at least every 5 years; and  sleep for at least 7 hours a day. -She is extensively counseled for smoking cessation.  - she is  advised to maintain close follow up with Sharion Balloon, FNP for primary care needs, as well as her other providers for optimal and coordinated care.  - Patient Care Time Today:  25 min, of which >50% was spent in  counseling and the rest reviewing her  current and  previous labs/studies, previous treatments, her blood glucose readings, and medications'  doses and developing a plan for long-term care based on the latest recommendations for standards of care.   Jamie [name] participated in the discussions, expressed understanding, and voiced agreement with the above plans.  All questions were answered to her satisfaction. she is encouraged to contact clinic should she have any questions or concerns prior to her return visit.   Follow up plan: - Return in about 3 months (around 11/24/2019) for Bring Meter and Logs- A1c in Office, Include 8 log sheets.  Glade Lloyd, MD [facility] [address] [place], [place] [address] Phone: [phone]  Fax: [phone]    08/24/2019, 12:33 PM  This note was partially dictated with voice recognition software. Similar sounding words can be transcribed inadequately or may not  be corrected upon review.

## 2019-08-26 ENCOUNTER — Encounter: Payer: Medicare HMO | Admitting: *Deleted

## 2019-11-01 ENCOUNTER — Other Ambulatory Visit: Payer: Self-pay

## 2019-11-02 ENCOUNTER — Ambulatory Visit (INDEPENDENT_AMBULATORY_CARE_PROVIDER_SITE_OTHER): Payer: Medicare HMO | Admitting: Family

## 2019-11-02 ENCOUNTER — Encounter: Payer: Self-pay | Admitting: Family

## 2019-11-02 VITALS — BP 146/83 | HR 82 | Temp 97.3°F | Ht 61.0 in | Wt 96.0 lb

## 2019-11-02 DIAGNOSIS — Z716 Tobacco abuse counseling: Secondary | ICD-10-CM

## 2019-11-02 DIAGNOSIS — E1169 Type 2 diabetes mellitus with other specified complication: Secondary | ICD-10-CM

## 2019-11-02 DIAGNOSIS — I10 Essential (primary) hypertension: Secondary | ICD-10-CM | POA: Diagnosis not present

## 2019-11-02 DIAGNOSIS — E785 Hyperlipidemia, unspecified: Secondary | ICD-10-CM | POA: Diagnosis not present

## 2019-11-02 DIAGNOSIS — F17200 Nicotine dependence, unspecified, uncomplicated: Secondary | ICD-10-CM | POA: Diagnosis not present

## 2019-11-02 DIAGNOSIS — M5441 Lumbago with sciatica, right side: Secondary | ICD-10-CM

## 2019-11-02 DIAGNOSIS — E782 Mixed hyperlipidemia: Secondary | ICD-10-CM | POA: Diagnosis not present

## 2019-11-02 DIAGNOSIS — G8929 Other chronic pain: Secondary | ICD-10-CM

## 2019-11-02 DIAGNOSIS — K219 Gastro-esophageal reflux disease without esophagitis: Secondary | ICD-10-CM | POA: Diagnosis not present

## 2019-11-02 DIAGNOSIS — H9193 Unspecified hearing loss, bilateral: Secondary | ICD-10-CM

## 2019-11-02 DIAGNOSIS — E1159 Type 2 diabetes mellitus with other circulatory complications: Secondary | ICD-10-CM

## 2019-11-02 DIAGNOSIS — E118 Type 2 diabetes mellitus with unspecified complications: Secondary | ICD-10-CM | POA: Diagnosis not present

## 2019-11-02 DIAGNOSIS — J449 Chronic obstructive pulmonary disease, unspecified: Secondary | ICD-10-CM

## 2019-11-02 DIAGNOSIS — M5416 Radiculopathy, lumbar region: Secondary | ICD-10-CM

## 2019-11-02 DIAGNOSIS — E0865 Diabetes mellitus due to underlying condition with hyperglycemia: Secondary | ICD-10-CM

## 2019-11-02 DIAGNOSIS — E559 Vitamin D deficiency, unspecified: Secondary | ICD-10-CM

## 2019-11-02 LAB — BAYER DCA HB A1C WAIVED: HB A1C (BAYER DCA - WAIVED): 9 % — ABNORMAL HIGH (ref <7.0)

## 2019-11-02 MED ORDER — GABAPENTIN 100 MG PO CAPS: 100.0000 mg | ORAL_CAPSULE | Freq: Two times a day (BID) | ORAL | 1 refills | Status: DC

## 2019-11-02 MED ORDER — PRAVASTATIN SODIUM 20 MG PO TABS: 20.0000 mg | ORAL_TABLET | Freq: Every day | ORAL | 1 refills | Status: DC

## 2019-11-02 MED ORDER — LOSARTAN POTASSIUM 100 MG PO TABS: 100.0000 mg | ORAL_TABLET | Freq: Every day | ORAL | 1 refills | Status: DC

## 2019-11-02 MED ORDER — AMLODIPINE BESYLATE 5 MG PO TABS: 5.0000 mg | ORAL_TABLET | Freq: Every day | ORAL | 1 refills | Status: DC

## 2019-11-02 NOTE — Progress Notes (Signed)
Subjective:    Patient ID: Jamie [name], female    DOB: 21-Aug-1951, 69 y.o.   MRN: [id number]  Chief Complaint  Patient presents with  . Diabetes    3 month follow up  . COPD   PT presents to the office today for chronic follow up.PT had surgical decompression and stabilization from L2-L4 on 02/05/17. Pt states her back continues to hurtand her pain is a8 out 10 when she walks or standing.  Her A1C is elevated today. She is followed by  Endocrinologists.  Diabetes She presents for her follow-up diabetic visit. She has type 2 diabetes mellitus. Her disease course has been worsening. There are no hypoglycemic associated symptoms. Pertinent negatives for hypoglycemia include no sleepiness. Associated symptoms include foot paresthesias. Pertinent negatives for diabetes include no blurred vision. There are no hypoglycemic complications. Symptoms are stable. There are no diabetic complications. Risk factors for coronary artery disease include dyslipidemia, diabetes mellitus, hypertension and post-menopausal. She is following a generally healthy diet. Her overall blood glucose range is 140-180 mg/dl.  COPD She complains of shortness of breath (When walking distances) and wheezing. There is no cough. This is a chronic problem. The current episode started more than 1 year ago. The problem occurs intermittently. Associated symptoms include heartburn. Pertinent negatives include no malaise/fatigue. Her past medical history is significant for COPD.  Hypertension This is a chronic problem. The current episode started more than 1 year ago. The problem has been waxing and waning since onset. The problem is controlled. Associated symptoms include shortness of breath (When walking distances). Pertinent negatives include no blurred vision, malaise/fatigue or peripheral edema. Risk factors for coronary artery disease include dyslipidemia, diabetes mellitus, sedentary lifestyle and smoking/tobacco exposure.  The current treatment provides moderate improvement. There is no history of kidney disease or CAD/MI.  Gastroesophageal Reflux She complains of belching, heartburn and wheezing. She reports no coughing. This is a chronic problem. The problem occurs rarely. The problem has been waxing and waning. She has tried a PPI for the symptoms. The treatment provided moderate relief.  Back Pain This is a chronic problem. The current episode started more than 1 year ago. The problem occurs constantly. The problem has been waxing and waning since onset. The pain is present in the lumbar spine. The quality of the pain is described as aching. The pain radiates to the left thigh. The pain is at a severity of 8/10. The pain is moderate. The symptoms are aggravated by standing. Stiffness is present in the morning. Associated symptoms include leg pain. She has tried muscle relaxant for the symptoms. The treatment provided mild relief.  Hyperlipidemia This is a chronic problem. The current episode started more than 1 year ago. The problem is uncontrolled. Associated symptoms include leg pain and shortness of breath (When walking distances). Current antihyperlipidemic treatment includes statins. The current treatment provides moderate improvement of lipids.  Nicotine Dependence Presents for follow-up visit. Her urge triggers include company of smokers. She smokes 1 pack of cigarettes per day. Compliance with prior treatments has been good.      Review of Systems  Constitutional: Negative for malaise/fatigue.  Eyes: Negative for blurred vision.  Respiratory: Positive for shortness of breath (When walking distances) and wheezing. Negative for cough.   Gastrointestinal: Positive for heartburn.  Musculoskeletal: Positive for back pain.  All other systems reviewed and are negative.      Objective:   Physical Exam Vitals reviewed.  Constitutional:      General: She  is not in acute distress.    Appearance: She is  well-developed.  HENT:     Head: Normocephalic and atraumatic.     Right Ear: Tympanic membrane normal.     Left Ear: Tympanic membrane normal.     Ears:     Comments: HOH Eyes:     Pupils: Pupils are equal, round, and reactive to light.  Neck:     Thyroid: No thyromegaly.  Cardiovascular:     Rate and Rhythm: Normal rate and regular rhythm.     Heart sounds: Normal heart sounds. No murmur.  Pulmonary:     Effort: Pulmonary effort is normal. No respiratory distress.     Breath sounds: Normal breath sounds. No wheezing.  Abdominal:     General: Bowel sounds are normal. There is no distension.     Palpations: Abdomen is soft.     Tenderness: There is no abdominal tenderness.  Musculoskeletal:        General: No tenderness.     Cervical back: Normal range of motion and neck supple.     Comments: Pain in lumbar with flexion and extension  Skin:    General: Skin is warm and dry.  Neurological:     Mental Status: She is alert and oriented to person, place, and time.     Cranial Nerves: No cranial nerve deficit.     Deep Tendon Reflexes: Reflexes are normal and symmetric.  Psychiatric:        Behavior: Behavior normal.        Thought Content: Thought content normal.        Judgment: Judgment normal.     Diabetic Foot Exam - Simple   Simple Foot Form Diabetic Foot exam was performed with the following findings: Yes 11/02/2019  8:59 AM  Visual Inspection No deformities, no ulcerations, no other skin breakdown bilaterally: Yes Sensation Testing Intact to touch and monofilament testing bilaterally: Yes Pulse Check Posterior Tibialis and Dorsalis pulse intact bilaterally: Yes Comments      BP (!) 146/83   Pulse 82   Temp (!) 97.3 F (36.3 C) (Temporal)   Ht '5\' 1"'  (1.549 m)   Wt 96 lb (43.5 kg)   SpO2 95%   BMI 18.14 kg/m      Assessment & Plan:  Jamie [name] comes in today with chief complaint of Diabetes (3 month follow up) and COPD   Diagnosis and orders  addressed:  1. Diabetes mellitus with complication ([place]) - Bayer DCA Hb A1c Waived - CMP14+EGFR - CBC with Differential/Platelet  2. Essential hypertension - losartan (COZAAR) 100 MG tablet; Take 1 tablet (100 mg total) by mouth daily.  Dispense: 90 tablet; Refill: 1 - amLODipine (NORVASC) 5 MG tablet; Take 1 tablet (5 mg total) by mouth daily.  Dispense: 90 tablet; Refill: 1 - CMP14+EGFR - CBC with Differential/Platelet  3. Hyperlipidemia associated with type 2 diabetes mellitus ([facility]) - pravastatin (PRAVACHOL) 20 MG tablet; Take 1 tablet (20 mg total) by mouth daily.  Dispense: 90 tablet; Refill: 1 - CMP14+EGFR - CBC with Differential/Platelet  4. DM type 2 causing vascular disease ([place]) Keep follow up with Endocrinologists  - CMP14+EGFR - CBC with Differential/Platelet  5. Essential hypertension, benign - CMP14+EGFR - CBC with Differential/Platelet  6. Moderate COPD (chronic obstructive pulmonary disease) ([facility]) - CMP14+EGFR - CBC with Differential/Platelet  7. Gastroesophageal reflux disease without esophagitis - CMP14+EGFR - CBC with Differential/Platelet  8. Diabetes mellitus due to underlying condition, uncontrolled, with hyperglycemia, without long-term  current use of insulin ([facility]) - CMP14+EGFR - CBC with Differential/Platelet  9. Current smoker Smoking cessation discussed - CMP14+EGFR - CBC with Differential/Platelet  10. Mixed hyperlipidemia - CMP14+EGFR - CBC with Differential/Platelet  11. Chronic bilateral low back pain with right-sided sciatica - CMP14+EGFR - CBC with Differential/Platelet  12. Tobacco abuse counseling - CMP14+EGFR - CBC with Differential/Platelet  13. Bilateral hearing loss, unspecified hearing loss type - CMP14+EGFR - CBC with Differential/Platelet  14. Vitamin D deficiency - CMP14+EGFR - CBC with Differential/Platelet  15. Lumbar radiculopathy, chronic Gabapentin added today - CMP14+EGFR - CBC with  Differential/Platelet - gabapentin (NEURONTIN) 100 MG capsule; Take 1 capsule (100 mg total) by mouth 2 (two) times daily.  Dispense: 60 capsule; Refill: 1   Labs pending Health Maintenance reviewed Diet and exercise encouraged  Follow up plan: 3 months    Evelina Dun, FNP

## 2019-11-02 NOTE — Patient Instructions (Signed)

## 2019-11-03 LAB — CMP14+EGFR
ALT: 11 IU/L (ref 0–32)
AST: 11 IU/L (ref 0–40)
Albumin/Globulin Ratio: 1.6 (ref 1.2–2.2)
Albumin: 4.1 g/dL (ref 3.8–4.8)
Alkaline Phosphatase: 129 IU/L — ABNORMAL HIGH (ref 39–117)
BUN/Creatinine Ratio: 20 (ref 12–28)
BUN: 14 mg/dL (ref 8–27)
Bilirubin Total: 0.4 mg/dL (ref 0.0–1.2)
CO2: 23 mmol/L (ref 20–29)
Calcium: 9.7 mg/dL (ref 8.7–10.3)
Chloride: 99 mmol/L (ref 96–106)
Creatinine, Ser: 0.69 mg/dL (ref 0.57–1.00)
GFR calc Af Amer: 104 mL/min/{1.73_m2} (ref >59)
GFR calc non Af Amer: 90 mL/min/{1.73_m2} (ref >59)
Globulin, Total: 2.5 g/dL (ref 1.5–4.5)
Glucose: 174 mg/dL — ABNORMAL HIGH (ref 65–99)
Potassium: 4.5 mmol/L (ref 3.5–5.2)
Sodium: 137 mmol/L (ref 134–144)
Total Protein: 6.6 g/dL (ref 6.0–8.5)

## 2019-11-03 LAB — CBC WITH DIFFERENTIAL/PLATELET
Basophils Absolute: 0.1 10*3/uL (ref 0.0–0.2)
Basos: 1 %
EOS (ABSOLUTE): 0.2 10*3/uL (ref 0.0–0.4)
Eos: 3 %
Hematocrit: 40.5 % (ref 34.0–46.6)
Hemoglobin: 13 g/dL (ref 11.1–15.9)
Immature Grans (Abs): 0 10*3/uL (ref 0.0–0.1)
Immature Granulocytes: 1 %
Lymphocytes Absolute: 1.6 10*3/uL (ref 0.7–3.1)
Lymphs: 23 %
MCH: 25.3 pg — ABNORMAL LOW (ref 26.6–33.0)
MCHC: 32.1 g/dL (ref 31.5–35.7)
MCV: 79 fL (ref 79–97)
Monocytes Absolute: 0.7 10*3/uL (ref 0.1–0.9)
Monocytes: 10 %
Neutrophils Absolute: 4.1 10*3/uL (ref 1.4–7.0)
Neutrophils: 62 %
Platelets: 215 10*3/uL (ref 150–450)
RBC: 5.14 x10E6/uL (ref 3.77–5.28)
RDW: 15.4 % (ref 11.7–15.4)
WBC: 6.6 10*3/uL (ref 3.4–10.8)

## 2019-11-25 ENCOUNTER — Ambulatory Visit: Payer: Medicare HMO | Admitting: "Endocrinology

## 2019-11-26 ENCOUNTER — Ambulatory Visit (INDEPENDENT_AMBULATORY_CARE_PROVIDER_SITE_OTHER): Payer: Medicare HMO | Admitting: *Deleted

## 2019-11-26 DIAGNOSIS — Z0000 Encounter for general adult medical examination without abnormal findings: Secondary | ICD-10-CM

## 2019-11-26 NOTE — Progress Notes (Addendum)
MEDICARE ANNUAL WELLNESS VISIT  11/26/2019  Telephone Visit Disclaimer This Medicare AWV was conducted by telephone due to national recommendations for restrictions regarding the COVID-19 Pandemic (e.g. social distancing).  I verified, using two identifiers, that I am speaking with Jamie [name] or their authorized healthcare agent. I discussed the limitations, risks, security, and privacy concerns of performing an evaluation and management service by telephone and the potential availability of an in-person appointment in the future. The patient expressed understanding and agreed to proceed.   Subjective:  Jamie [name] is a 69 y.o. female patient of Hawks, Theador Hawthorne, FNP who had a Medicare Annual Wellness Visit today via telephone. Jamie [name] is Retired and lives with their spouse. she has 1 child. she reports that she is socially active and does interact with friends/family regularly. she is minimally physically active and enjoys playing games on her tablet, cooking and working around the house.  Patient Care Team: Sharion Balloon, FNP as PCP - General (Nurse Practitioner) Ilean China, RN as Registered Nurse  Advanced Directives 11/26/2019 02/26/2019 02/17/2019 09/16/2018 09/09/2018 09/08/2018 02/03/2017  Does Patient Have a Medical Advance Directive? No No No No Yes Yes No  Type of Advance Directive - - - - Living will Living will -  Does patient want to make changes to medical advance directive? - - - Yes (MAU/Ambulatory/Procedural Areas - Information given) No - Patient declined - -  Copy of [place] in [place]  Would patient like information on creating a medical advance directive? No - Patient declined No - Patient declined - Yes (MAU/Ambulatory/Procedural Areas - Information given) - - -    Hospital Utilization Over the Past 12 Months: # of hospitalizations or ER visits: 0 # of surgeries: 0  Review of Systems    Patient reports that her overall  health is unchanged compared to last year.  History obtained from chart review  Patient Reported Readings (BP, Pulse, CBG, Weight, etc) none  Pain Assessment Pain : No/denies pain     Current Medications & Allergies (verified) Allergies as of 11/26/2019       Reactions   Lipitor [atorvastatin] Other (See Comments)   Constipation    Soma [carisoprodol] Nausea And Vomiting        Medication List        Accurate as of November 26, 2019  1:33 PM. If you have any questions, ask your nurse or doctor.          Accu-Chek Aviva Plus test strip Generic drug: glucose blood Check BS BID Dx E11.9   Accu-Chek Aviva Plus w/Device Kit Check BS BID Dx E11.9   Accu-Chek Aviva Soln Use with glucose machine Dx E11.9   Accu-Chek FastClix Lancets Misc Use to check blood sugar two times daily  Dx Code E11.9   albuterol (2.5 MG/3ML) 0.083% nebulizer solution Commonly known as: PROVENTIL Take 3 mLs (2.5 mg total) by nebulization every 6 (six) hours as needed for wheezing or shortness of breath.   amLODipine 5 MG tablet Commonly known as: NORVASC Take 1 tablet (5 mg total) by mouth daily.   B-D SINGLE USE SWABS REGULAR Pads Test BS BID Dx E11.9   gabapentin 100 MG capsule Commonly known as: NEURONTIN Take 1 capsule (100 mg total) by mouth 2 (two) times daily.   Insulin Glargine 100 UNIT/ML Solostar Pen Commonly known as: LANTUS Inject 26 Units into the skin daily with breakfast.  losartan 100 MG tablet Commonly known as: COZAAR Take 1 tablet (100 mg total) by mouth daily.   methocarbamol 500 MG tablet Commonly known as: ROBAXIN Take 1 tablet (500 mg total) by mouth every 6 (six) hours as needed for muscle spasms.   Pen Needles 32G X 4 MM Misc Inject 1 applicator into the skin daily. Dx E11.9   pravastatin 20 MG tablet Commonly known as: PRAVACHOL Take 1 tablet (20 mg total) by mouth daily.        History (reviewed): Past Medical History:  Diagnosis Date    Anxiety    Breast cancer ([place])    COPD (chronic obstructive pulmonary disease) ([place])    Deaf    Depression    Diabetes ([place])    HOH (hard of hearing)    HTN (hypertension)    Hyperlipidemia    Personal history of radiation therapy    Vitamin D deficiency    Past Surgical History:  Procedure Laterality Date   APPLICATION OF ROBOTIC ASSISTANCE FOR SPINAL PROCEDURE N/A 02/23/2019   Procedure: APPLICATION OF ROBOTIC ASSISTANCE FOR SPINAL PROCEDURE;  Surgeon: Kristeen Miss, MD;  Location: [facility];  Service: Neurosurgery;  Laterality: N/A;   BACK SURGERY     BREAST SURGERY     radiation   CARPAL TUNNEL RELEASE     CERVICAL SPINE SURGERY     EYE SURGERY Bilateral    cataracts   GALLBLADDER SURGERY     INNER EAR SURGERY     POSTERIOR LUMBAR FUSION 4 LEVEL N/A 02/23/2019   Procedure: Thoracic ten to Lumbar two decompression with Posterior lumbar interbody fusion lumbar one-lumbar two with Osteotomy at Lumbar one-Lumbar two, Posterior arthrodesis Thoracic ten to Lumbar two with Mazor;  Surgeon: Kristeen Miss, MD;  Location: [facility];  Service: Neurosurgery;  Laterality: N/A;   SPINE SURGERY     Family History  Problem Relation Age of Onset   Heart attack Mother    Stroke Mother    Stroke Sister    Diabetes Other        sister x3, and mother   Lung disease Father        worked in Company secretary cancer Neg Hx    Colon polyps Neg Hx    Social History   Socioeconomic History   Marital status: Married    Spouse name: Not on file   Number of children: 1   Years of education: 12   Highest education level: High school graduate  Occupational History   Occupation: disability  Tobacco Use   Smoking status: Current Every Day Smoker    Packs/day: 1.00    Years: 51.00    Pack years: 51.00    Types: Cigarettes   Smokeless tobacco: Never Used  Substance and Sexual Activity   Alcohol use: No   Drug use: No   Sexual activity: Not Currently    Birth control/protection: None  Other  Topics Concern   Not on file  Social History Narrative   Retired/disabled. Lives at home with her husband. He still works part-time. Currently following social distancing guidelines.    Social Determinants of Health   Financial Resource Strain: Low Risk    Difficulty of Paying Living Expenses: Not hard at all  Food Insecurity: No Food Insecurity   Worried About Charity fundraiser in the Last Year: Never true   Ran Out of Food in the Last Year: Never true  Transportation Needs: No Transportation Needs  Lack of Transportation (Medical): No   Lack of Transportation (Non-Medical): No  Physical Activity: Inactive   Days of Exercise per Week: 0 days   Minutes of Exercise per Session: 0 min  Stress: Stress Concern Present   Feeling of Stress : To some extent  Social Connections: Somewhat Isolated   Frequency of Communication with Friends and Family: More than three times a week   Frequency of Social Gatherings with Friends and Family: More than three times a week   Attends Religious Services: Never   Marine scientist or Organizations: No   Attends Archivist Meetings: Never   Marital Status: Married    Activities of Daily Living In your present state of health, do you have any difficulty performing the following activities: 11/26/2019 05/05/2019  Hearing? Y Y  Comment deaf in one ear and about 50% deficit in the other ear-can't afford hearing aids due to recurrent ear infections during childhood  Vision? N N  Difficulty concentrating or making decisions? Y N  Comment has trouble remembering names -  Walking or climbing stairs? N Y  Comment - chronic back pain with history of surgical treatment  Dressing or bathing? N N  Doing errands, shopping? Y N  Comment her husband takes her to do all errands and to all doctor appointments -  Preparing Food and eating ? N -  Using the Toilet? N -  In the past six months, have you accidently leaked urine? Y -  Comment wears a  pad -  Do you have problems with loss of bowel control? N -  Managing your Medications? N -  Managing your Finances? N -  Housekeeping or managing your Housekeeping? N -  Some recent data might be hidden    Patient Education/ Literacy How often do you need to have someone help you when you read instructions, pamphlets, or other written materials from your doctor or pharmacy?: 1 - Never What is the last grade level you completed in school?: 12th grade  Exercise Current Exercise Habits: The patient does not participate in regular exercise at present, Exercise limited by: respiratory conditions(s);orthopedic condition(s)  Diet Patient reports consuming 2 meals a day and 1 snack(s) a day Patient reports that her primary diet is: Regular Patient reports that she does have regular access to food.   Depression Screen PHQ 2/9 Scores 11/26/2019 11/02/2019 07/26/2019 07/26/2019 05/05/2019 04/20/2019 03/04/2019  PHQ - 2 Score 0 0 0 0 0 0 0  PHQ- 9 Score - - - - - - -     Fall Risk Fall Risk  11/26/2019 11/02/2019 07/26/2019 07/26/2019 05/05/2019  Falls in the past year? 0 0 0 0 0  Number falls in past yr: - - - - 0  Comment - - - - -  Injury with Fall? - - - - 0  Risk for fall due to : - - - - Medication side effect  Follow up - - - - Falls prevention discussed     Objective:  Jamie [name] seemed alert and oriented and she participated appropriately during our telephone visit.  Blood Pressure Weight BMI  BP Readings from Last 3 Encounters:  11/02/19 (!) 146/83  08/10/19 130/85  07/26/19 (!) 152/77   Wt Readings from Last 3 Encounters:  11/02/19 96 lb (43.5 kg)  08/10/19 95 lb (43.1 kg)  07/26/19 95 lb 3.2 oz (43.2 kg)   BMI Readings from Last 1 Encounters:  11/02/19 18.14 kg/m    *  Unable to obtain current vital signs, weight, and BMI due to telephone visit type  Hearing/Vision  Kosha did not seem to have difficulty with hearing/understanding during the telephone  conversation Reports that she has not had a formal eye exam by an eye care professional within the past year Reports that she has not had a formal hearing evaluation within the past year *Unable to fully assess hearing and vision during telephone visit type  Cognitive Function: 6CIT Screen 11/26/2019 09/16/2018  What Year? 0 points 0 points  What month? 0 points 0 points  What time? 0 points 0 points  Count back from 20 0 points 0 points  Months in reverse 2 points 2 points  Repeat phrase 0 points 0 points  Total Score 2 2   (Normal:0-7, Significant for Dysfunction: >8)  Normal Cognitive Function Screening: Yes   Immunization & Health Maintenance Record Immunization History  Administered Date(s) Administered   Fluad Quad(high Dose 65+) 07/26/2019   Influenza Split 08/14/2012, 07/17/2013   Influenza, High Dose Seasonal PF 07/28/2018   Influenza,inj,Quad PF,6+ Mos 07/11/2014, 07/14/2015, 07/15/2016, 07/22/2017   Pneumococcal Conjugate-13 02/13/2015   Pneumococcal Polysaccharide-23 10/14/2002, 01/29/2018   Zoster Recombinat (Shingrix) 09/16/2018    Health Maintenance  Topic Date Due   OPHTHALMOLOGY EXAM  01/23/2019   TETANUS/TDAP  11/01/2020 (Originally 08/11/1970)   MAMMOGRAM  02/05/2020   HEMOGLOBIN A1C  05/01/2020   FOOT EXAM  11/01/2020   Fecal DNA (Cologuard)  03/04/2021   INFLUENZA VACCINE  Completed   DEXA SCAN  Completed   Hepatitis C Screening  Completed   PNA vac Low Risk Adult  Completed       Assessment  This is a routine wellness examination for Jamie [name].  Health Maintenance: Due or Overdue Health Maintenance Due  Topic Date Due   OPHTHALMOLOGY EXAM  01/23/2019    Jamie [name] does not need a referral for Community Assistance: Care Management:   no Social Work:    no Prescription Assistance:  no Nutrition/Diabetes Education:  no   Plan:  Personalized Goals Goals Addressed             This Visit's Progress    DIET - INCREASE WATER  INTAKE       Try to drink 6-8 glasses of water daily       Personalized Health Maintenance & Screening Recommendations  Td vaccine 2nd Shingrix Shot  Lung Cancer Screening Recommended: no (Low Dose CT Chest recommended if Age 48-80 years, 30 pack-year currently smoking OR have quit w/in past 15 years) Hepatitis C Screening recommended: no HIV Screening recommended: no  Advanced Directives: Written information was not prepared per patient's request.  Referrals & Orders No orders of the defined types were placed in this encounter.   Follow-up Plan Follow-up with Sharion Balloon, FNP as planned Schedule your Diabetic Eye Exam as discussed Consider TDAP and 2nd Shingrix vaccine at your next visit with your PCP   I have personally reviewed and noted the following in the patient's chart:   Medical and social history Use of alcohol, tobacco or illicit drugs  Current medications and supplements Functional ability and status Nutritional status Physical activity Advanced directives List of other physicians Hospitalizations, surgeries, and ER visits in previous 12 months Vitals Screenings to include cognitive, depression, and falls Referrals and appointments  In addition, I have reviewed and discussed with Jamie [name] certain preventive protocols, quality metrics, and best practice recommendations. A written personalized care plan for preventive  services as well as general preventive health recommendations is available and can be mailed to the patient at her request.      Milas Hock, LPN  [id number]   I have reviewed and agree with the above AWV documentation.   Evelina Dun, FNP

## 2019-11-26 NOTE — Patient Instructions (Signed)
Preventive Care 38 Years and Older, Female Preventive care refers to lifestyle choices and visits with your health care provider that can promote health and wellness. This includes:  A yearly physical exam. This is also called an annual well check.  Regular dental and eye exams.  Immunizations.  Screening for certain conditions.  Healthy lifestyle choices, such as diet and exercise. What can I expect for my preventive care visit? Physical exam Your health care provider will check:  Height and weight. These may be used to calculate body mass index (BMI), which is a measurement that tells if you are at a healthy weight.  Heart rate and blood pressure.  Your skin for abnormal spots. Counseling Your health care provider may ask you questions about:  Alcohol, tobacco, and drug use.  Emotional well-being.  Home and relationship well-being.  Sexual activity.  Eating habits.  History of falls.  Memory and ability to understand (cognition).  Work and work Statistician.  Pregnancy and menstrual history. What immunizations do I need?  Influenza (flu) vaccine  This is recommended every year. Tetanus, diphtheria, and pertussis (Tdap) vaccine  You may need a Td booster every 10 years. Varicella (chickenpox) vaccine  You may need this vaccine if you have not already been vaccinated. Zoster (shingles) vaccine  You may need this after age 33. Pneumococcal conjugate (PCV13) vaccine  One dose is recommended after age 33. Pneumococcal polysaccharide (PPSV23) vaccine  One dose is recommended after age 72. Measles, mumps, and rubella (MMR) vaccine  You may need at least one dose of MMR if you were born in 1957 or later. You may also need a second dose. Meningococcal conjugate (MenACWY) vaccine  You may need this if you have certain conditions. Hepatitis A vaccine  You may need this if you have certain conditions or if you travel or work in places where you may be exposed  to hepatitis A. Hepatitis B vaccine  You may need this if you have certain conditions or if you travel or work in places where you may be exposed to hepatitis B. Haemophilus influenzae type b (Hib) vaccine  You may need this if you have certain conditions. You may receive vaccines as individual doses or as more than one vaccine together in one shot (combination vaccines). Talk with your health care provider about the risks and benefits of combination vaccines. What tests do I need? Blood tests  Lipid and cholesterol levels. These may be checked every 5 years, or more frequently depending on your overall health.  Hepatitis C test.  Hepatitis B test. Screening  Lung cancer screening. You may have this screening every year starting at age 39 if you have a 30-pack-year history of smoking and currently smoke or have quit within the past 15 years.  Colorectal cancer screening. All adults should have this screening starting at age 36 and continuing until age 15. Your health care provider may recommend screening at age 23 if you are at increased risk. You will have tests every 1-10 years, depending on your results and the type of screening test.  Diabetes screening. This is done by checking your blood sugar (glucose) after you have not eaten for a while (fasting). You may have this done every 1-3 years.  Mammogram. This may be done every 1-2 years. Talk with your health care provider about how often you should have regular mammograms.  BRCA-related cancer screening. This may be done if you have a family history of breast, ovarian, tubal, or peritoneal cancers.  Other tests  Sexually transmitted disease (STD) testing.  Bone density scan. This is done to screen for osteoporosis. You may have this done starting at age 44. Follow these instructions at home: Eating and drinking  Eat a diet that includes fresh fruits and vegetables, whole grains, lean protein, and low-fat dairy products. Limit  your intake of foods with high amounts of sugar, saturated fats, and salt.  Take vitamin and mineral supplements as recommended by your health care provider.  Do not drink alcohol if your health care provider tells you not to drink.  If you drink alcohol: ? Limit how much you have to 0-1 drink a day. ? Be aware of how much alcohol is in your drink. In the [place] one drink equals one 12 oz bottle of beer (355 mL), one 5 oz glass of wine (148 mL), or one 1 oz glass of hard liquor (44 mL). Lifestyle  Take daily care of your teeth and gums.  Stay active. Exercise for at least 30 minutes on 5 or more days each week.  Do not use any products that contain nicotine or tobacco, such as cigarettes, e-cigarettes, and chewing tobacco. If you need help quitting, ask your health care provider.  If you are sexually active, practice safe sex. Use a condom or other form of protection in order to prevent STIs (sexually transmitted infections).  Talk with your health care provider about taking a low-dose aspirin or statin. What's next?  Go to your health care provider once a year for a well check visit.  Ask your health care provider how often you should have your eyes and teeth checked.  Stay up to date on all vaccines. This information is not intended to replace advice given to you by your health care provider. Make sure you discuss any questions you have with your health care provider. Document Revised: 09/24/2018 Document Reviewed: 09/24/2018 Elsevier Patient Education  2020 [employer].

## 2019-11-29 ENCOUNTER — Ambulatory Visit (INDEPENDENT_AMBULATORY_CARE_PROVIDER_SITE_OTHER): Payer: Medicare HMO | Admitting: *Deleted

## 2019-11-29 DIAGNOSIS — I10 Essential (primary) hypertension: Secondary | ICD-10-CM

## 2019-11-29 DIAGNOSIS — E1159 Type 2 diabetes mellitus with other circulatory complications: Secondary | ICD-10-CM

## 2019-11-29 NOTE — Patient Instructions (Signed)
Visit Information  Goals Addressed            This Visit's Progress     Patient Stated   . "I would like to control my blood sugar better" (pt-stated)       Current Barriers:  . Chronic Disease Management support and education needs related to diabetes . Cost associated with diabetes management  Nurse Case Manager Clinical Goal(s):  Marland Kitchen Over the next 30 days, patient will work with Dr Dorris Fetch to address needs related to diabetes management  Interventions:  . Evaluation of current treatment plan related to diabetes and patient's adherence to plan as established by provider. . Advised patient to take Lantus 26 units each morning as prescribed by Dr Dorris Fetch . Provided education to patient re: diabetic diet/meal planning . Reviewed medications with patient and discussed Novolin vs Lantus . Discussed plans with patient for ongoing care management follow up and provided patient with direct contact information for care management team . Reviewed scheduled/upcoming provider appointments including: Dr Dorris Fetch on 12/03/2019 . Advised patient, providing education and rationale, to check cbg twice daily as instructed by Dr Dorris Fetch and record. Take blood sugar meter and log to appointment on 2/19. . Advised to reach out to Dr Dorris Fetch with blood sugar readings below 70 or above 200 . Discussed current insulin type and dosing instructions  Patient Self Care Activities:  . Self administers medications as prescribed . Calls pharmacy for medication refills . Calls provider office for new concerns or questions  Please see past updates related to this goal by clicking on the "Past Updates" button in the selected goal        The care management team will reach out to the patient again over the next 45 days.    Chong Sicilian, BSN, RN-BC Embedded Chronic Care Manager [facility] [employer] Management Direct Dial: [phone]   The patient verbalized understanding of instructions provided  today and declined a print copy of patient instruction materials.

## 2019-11-29 NOTE — Chronic Care Management (AMB) (Signed)
Erroneous encounter

## 2019-11-29 NOTE — Chronic Care Management (AMB) (Addendum)
Chronic Care Management   Follow Up Note   11/29/2019 Name: LANISE MERGEN MRN: [id number] DOB: 02/15/1951  Referred by: Sharion Balloon, FNP Reason for referral : Chronic Care Management   Jamie [name] is a 69 y.o. year old female who is a primary care patient of Sharion Balloon, FNP. The CCM team was consulted for assistance with chronic disease management and care coordination needs.    Review of patient status, including review of consultants reports, relevant laboratory and other test results, and collaboration with appropriate care team members and the patient's provider was performed as part of comprehensive patient evaluation and provision of chronic care management services.    Subjective:  I talked with Ms Collard by telephone today primarily regarding diabetes management.   Objective:  Outpatient Encounter Medications as of 11/29/2019  Medication Sig   Accu-Chek FastClix Lancets MISC Use to check blood sugar two times daily  Dx Code E11.9   albuterol (PROVENTIL) (2.5 MG/3ML) 0.083% nebulizer solution Take 3 mLs (2.5 mg total) by nebulization every 6 (six) hours as needed for wheezing or shortness of breath.   Alcohol Swabs (B-D SINGLE USE SWABS REGULAR) PADS Test BS BID Dx E11.9   amLODipine (NORVASC) 5 MG tablet Take 1 tablet (5 mg total) by mouth daily.   Blood Glucose Calibration (ACCU-CHEK AVIVA) SOLN Use with glucose machine Dx E11.9   Blood Glucose Monitoring Suppl (ACCU-CHEK AVIVA PLUS) w/Device KIT Check BS BID Dx E11.9   gabapentin (NEURONTIN) 100 MG capsule Take 1 capsule (100 mg total) by mouth 2 (two) times daily.   glucose blood (ACCU-CHEK AVIVA PLUS) test strip Check BS BID Dx E11.9   Insulin Glargine (LANTUS) 100 UNIT/ML Solostar Pen Inject 26 Units into the skin daily with breakfast.   Insulin Pen Needle (PEN NEEDLES) 32G X 4 MM MISC Inject 1 applicator into the skin daily. Dx E11.9   losartan (COZAAR) 100 MG tablet Take 1 tablet (100 mg total) by mouth daily.     methocarbamol (ROBAXIN) 500 MG tablet Take 1 tablet (500 mg total) by mouth every 6 (six) hours as needed for muscle spasms.   pravastatin (PRAVACHOL) 20 MG tablet Take 1 tablet (20 mg total) by mouth daily.   No facility-administered encounter medications on file as of 11/29/2019.      Lab Results  Component Value Date   HGBA1C 9.0 (H) 11/02/2019   HGBA1C 8.3 (H) 07/26/2019   HGBA1C 7.8 (H) 04/20/2019   Lab Results  Component Value Date   LDLCALC 73 04/20/2019   CREATININE 0.69 11/02/2019     RN Assessment & Care Plan            This Visit's Progress    "I would like to control my blood sugar better" (pt-stated)       Current Barriers:  Chronic Disease Management support and education needs related to diabetes Cost associated with diabetes management  Nurse Case Manager Clinical Goal(s):  Over the next 30 days, patient will work with Dr Dorris Fetch to address needs related to diabetes management  Interventions:  Evaluation of current treatment plan related to diabetes and patient's adherence to plan as established by provider. Advised patient to take Lantus 26 units each morning as prescribed by Dr Dorris Fetch Provided education to patient re: diabetic diet/meal planning Reviewed medications with patient and discussed Novolin vs Lantus Discussed plans with patient for ongoing care management follow up and provided patient with direct contact information for care management team Reviewed scheduled/upcoming  provider appointments including: Dr Dorris Fetch on 12/03/2019 Advised patient, providing education and rationale, to check cbg twice daily as instructed by Dr Dorris Fetch and record. Take blood sugar meter and log to appointment on 2/19. Advised to reach out to Dr Dorris Fetch with blood sugar readings below 70 or above 200 Discussed current insulin type and dosing instructions  Patient Self Care Activities:  Self administers medications as prescribed Calls pharmacy for medication refills Calls  provider office for new concerns or questions  Please see past updates related to this goal by clicking on the "Past Updates" button in the selected goal           Plan:   The care management team will reach out to the patient again over the next 45 days.    Chong Sicilian, BSN, RN-BC Embedded Chronic Care Manager [facility] Direct Dial: [phone]   I have reviewed and agree with the above  documentation.   Evelina Dun, FNP

## 2019-12-03 ENCOUNTER — Ambulatory Visit (INDEPENDENT_AMBULATORY_CARE_PROVIDER_SITE_OTHER): Payer: Medicare HMO | Admitting: "Endocrinology

## 2019-12-03 ENCOUNTER — Encounter: Payer: Self-pay | Admitting: "Endocrinology

## 2019-12-03 ENCOUNTER — Other Ambulatory Visit: Payer: Self-pay

## 2019-12-03 DIAGNOSIS — E782 Mixed hyperlipidemia: Secondary | ICD-10-CM

## 2019-12-03 DIAGNOSIS — F17200 Nicotine dependence, unspecified, uncomplicated: Secondary | ICD-10-CM | POA: Diagnosis not present

## 2019-12-03 DIAGNOSIS — E1159 Type 2 diabetes mellitus with other circulatory complications: Secondary | ICD-10-CM

## 2019-12-03 DIAGNOSIS — I10 Essential (primary) hypertension: Secondary | ICD-10-CM

## 2019-12-03 NOTE — Progress Notes (Signed)
12/03/2019, 11:20 AM                                                    Endocrinology Telehealth Visit Follow up Note -During COVID -19 Pandemic  This visit type was conducted due to national recommendations for restrictions regarding the COVID-19 Pandemic  in an effort to limit this patient's exposure and mitigate transmission of the corona virus.  Due to her co-morbid illnesses, Jamie [name] is at  moderate to high risk for complications without adequate follow up.  This format is felt to be most appropriate for her at this time.  I connected with this patient on 12/03/2019   by telephone and verified that I am speaking with the correct person using two identifiers. Jamie [name], 04-Feb-1951. she has verbally consented to this visit. All issues noted in this document were discussed and addressed. The format was not optimal for physical exam.    Subjective:    Patient ID: Jamie [name], female    DOB: [birth year].  Jamie [name] is being engaged in telehealth via telephone after she was seen in consultation for management of currently uncontrolled symptomatic diabetes requested by  Sharion Balloon, FNP.   Past Medical History:  Diagnosis Date  . Anxiety   . Breast cancer ([place])   . COPD (chronic obstructive pulmonary disease) ([place])   . Deaf   . Depression   . Diabetes ([place])   . HOH (hard of hearing)   . HTN (hypertension)   . Hyperlipidemia   . Personal history of radiation therapy   . Vitamin D deficiency     Past Surgical History:  Procedure Laterality Date  . APPLICATION OF ROBOTIC ASSISTANCE FOR SPINAL PROCEDURE N/A 02/23/2019   Procedure: APPLICATION OF ROBOTIC ASSISTANCE FOR SPINAL PROCEDURE;  Surgeon: Kristeen Miss, MD;  Location: [facility];  Service: Neurosurgery;  Laterality: N/A;  . BACK SURGERY    . BREAST SURGERY     radiation  . CARPAL TUNNEL RELEASE    . CERVICAL SPINE SURGERY    . EYE SURGERY  Bilateral    cataracts  . GALLBLADDER SURGERY    . INNER EAR SURGERY    . POSTERIOR LUMBAR FUSION 4 LEVEL N/A 02/23/2019   Procedure: Thoracic ten to Lumbar two decompression with Posterior lumbar interbody fusion lumbar one-lumbar two with Osteotomy at Lumbar one-Lumbar two, Posterior arthrodesis Thoracic ten to Lumbar two with Mazor;  Surgeon: Kristeen Miss, MD;  Location: [facility];  Service: Neurosurgery;  Laterality: N/A;  . SPINE SURGERY      Social History   Socioeconomic History  . Marital status: Married    Spouse name: Not on file  . Number of children: 1  . Years of education: 54  . Highest education level: High school graduate  Occupational History  . Occupation: disability  Tobacco Use  . Smoking status: Current Every Day Smoker    Packs/day: 1.00    Years: 51.00    Pack years: 51.00    Types: Cigarettes  .  Smokeless tobacco: Never Used  Substance and Sexual Activity  . Alcohol use: No  . Drug use: No  . Sexual activity: Not Currently    Birth control/protection: None  Other Topics Concern  . Not on file  Social History Narrative   Retired/disabled. Lives at home with her husband. He still works part-time. Currently following social distancing guidelines.    Social Determinants of Health   Financial Resource Strain: Low Risk   . Difficulty of Paying Living Expenses: Not hard at all  Food Insecurity: No Food Insecurity  . Worried About Charity fundraiser in the Last Year: Never true  . Ran Out of Food in the Last Year: Never true  Transportation Needs: No Transportation Needs  . Lack of Transportation (Medical): No  . Lack of Transportation (Non-Medical): No  Physical Activity: Inactive  . Days of Exercise per Week: 0 days  . Minutes of Exercise per Session: 0 min  Stress: Stress Concern Present  . Feeling of Stress : To some extent  Social Connections: Somewhat Isolated  . Frequency of Communication with Friends and Family: More than three times a week  .  Frequency of Social Gatherings with Friends and Family: More than three times a week  . Attends Religious Services: Never  . Active Member of Clubs or Organizations: No  . Attends Archivist Meetings: Never  . Marital Status: Married    Family History  Problem Relation Age of Onset  . Heart attack Mother   . Stroke Mother   . Stroke Sister   . Diabetes Other        sister x3, and mother  . Lung disease Father        worked in [employer]  . Colon cancer Neg Hx   . Colon polyps Neg Hx     Outpatient Encounter Medications as of 12/03/2019  Medication Sig  . Accu-Chek FastClix Lancets MISC Use to check blood sugar two times daily  Dx Code E11.9  . albuterol (PROVENTIL) (2.5 MG/3ML) 0.083% nebulizer solution Take 3 mLs (2.5 mg total) by nebulization every 6 (six) hours as needed for wheezing or shortness of breath.  . Alcohol Swabs (B-D SINGLE USE SWABS REGULAR) PADS Test BS BID Dx E11.9  . amLODipine (NORVASC) 5 MG tablet Take 1 tablet (5 mg total) by mouth daily.  . Blood Glucose Calibration (ACCU-CHEK AVIVA) SOLN Use with glucose machine Dx E11.9  . Blood Glucose Monitoring Suppl (ACCU-CHEK AVIVA PLUS) w/Device KIT Check BS BID Dx E11.9  . gabapentin (NEURONTIN) 100 MG capsule Take 1 capsule (100 mg total) by mouth 2 (two) times daily.  Marland Kitchen glucose blood (ACCU-CHEK AVIVA PLUS) test strip Check BS BID Dx E11.9  . Insulin Glargine (LANTUS) 100 UNIT/ML Solostar Pen Inject 25 Units into the skin daily with breakfast.  . Insulin Pen Needle (PEN NEEDLES) 32G X 4 MM MISC Inject 1 applicator into the skin daily. Dx E11.9  . losartan (COZAAR) 100 MG tablet Take 1 tablet (100 mg total) by mouth daily.  . methocarbamol (ROBAXIN) 500 MG tablet Take 1 tablet (500 mg total) by mouth every 6 (six) hours as needed for muscle spasms.  . pravastatin (PRAVACHOL) 20 MG tablet Take 1 tablet (20 mg total) by mouth daily.   No facility-administered encounter medications on file as of 12/03/2019.     ALLERGIES: Allergies  Allergen Reactions  . Lipitor [Atorvastatin] Other (See Comments)    Constipation   . Soma [Carisoprodol] Nausea And  Vomiting    VACCINATION STATUS: Immunization History  Administered Date(s) Administered  . Fluad Quad(high Dose 65+) 07/26/2019  . Influenza Split 08/14/2012, 07/17/2013  . Influenza, High Dose Seasonal PF 07/28/2018  . Influenza,inj,Quad PF,6+ Mos 07/11/2014, 07/14/2015, 07/15/2016, 07/22/2017  . Pneumococcal Conjugate-13 02/13/2015  . Pneumococcal Polysaccharide-23 10/14/2002, 01/29/2018  . Zoster Recombinat (Shingrix) 09/16/2018    Diabetes She presents for her follow-up diabetic visit. She has type 2 diabetes mellitus. Her disease course has been worsening. There are no hypoglycemic associated symptoms. Pertinent negatives for hypoglycemia include no confusion, headaches, pallor or seizures. Associated symptoms include fatigue, polydipsia and polyuria. Pertinent negatives for diabetes include no chest pain and no polyphagia. There are no hypoglycemic complications. Symptoms are improving. Diabetic complications include heart disease and peripheral neuropathy. Risk factors for coronary artery disease include diabetes mellitus, dyslipidemia, hypertension, tobacco exposure, sedentary lifestyle and post-menopausal. Current diabetic treatment includes insulin injections. Her weight is fluctuating minimally. She is following a generally unhealthy diet. When asked about meal planning, she reported none. She has not had a previous visit with a dietitian. She rarely participates in exercise. Her breakfast blood glucose range is generally 140-180 mg/dl. Her bedtime blood glucose range is generally 180-200 mg/dl. Her overall blood glucose range is 180-200 mg/dl. (She reports fasting blood glucose ranging from 63-1 83, bedtime readings between 137-249.  Her recent A1c was 9%, increasing from 8.3%.   ) An ACE inhibitor/angiotensin II receptor blocker is not  being taken. Eye exam is current.  Hyperlipidemia This is a chronic problem. The current episode started more than 1 year ago. The problem is controlled. Exacerbating diseases include diabetes. Pertinent negatives include no chest pain, myalgias or shortness of breath. Current antihyperlipidemic treatment includes statins. Risk factors for coronary artery disease include dyslipidemia, diabetes mellitus, hypertension, post-menopausal and a sedentary lifestyle.  Hypertension This is a chronic problem. The current episode started more than 1 year ago. Pertinent negatives include no chest pain, headaches, palpitations or shortness of breath. Risk factors for coronary artery disease include dyslipidemia, diabetes mellitus, sedentary lifestyle and smoking/tobacco exposure. Past treatments include calcium channel blockers.   Review of systems: Limited as above.   Objective:    There were no vitals taken for this visit.  Wt Readings from Last 3 Encounters:  11/02/19 96 lb (43.5 kg)  08/10/19 95 lb (43.1 kg)  07/26/19 95 lb 3.2 oz (43.2 kg)       CMP ( most recent) CMP     Component Value Date/Time   NA 137 11/02/2019 0903   K 4.5 11/02/2019 0903   CL 99 11/02/2019 0903   CO2 23 11/02/2019 0903   GLUCOSE 174 (H) 11/02/2019 0903   GLUCOSE 203 (H) 02/24/2019 0209   BUN 14 11/02/2019 0903   CREATININE 0.69 11/02/2019 0903   CALCIUM 9.7 11/02/2019 0903   PROT 6.6 11/02/2019 0903   ALBUMIN 4.1 11/02/2019 0903   AST 11 11/02/2019 0903   ALT 11 11/02/2019 0903   ALKPHOS 129 (H) 11/02/2019 0903   BILITOT 0.4 11/02/2019 0903   GFRNONAA 90 11/02/2019 0903   GFRAA 104 11/02/2019 0903     Diabetic Labs (most recent): Lab Results  Component Value Date   HGBA1C 9.0 (H) 11/02/2019   HGBA1C 8.3 (H) 07/26/2019   HGBA1C 7.8 (H) 04/20/2019     Lipid Panel ( most recent) Lipid Panel     Component Value Date/Time   CHOL 155 04/20/2019 1134   TRIG 97 04/20/2019 1134   TRIG 72  05/14/2013  1251   HDL 63 04/20/2019 1134   HDL 49 05/14/2013 1251   CHOLHDL 2.5 04/20/2019 1134   LDLCALC 73 04/20/2019 1134   LDLCALC 96 05/14/2013 1251   LABVLDL 19 04/20/2019 1134      Lab Results  Component Value Date   TSH 0.896 07/26/2019   TSH 0.787 12/14/2018   TSH 0.651 01/29/2018   TSH 0.476 02/13/2015      Assessment & Plan:   1. Diabetes mellitus with complication ([place])  - AEON KOORS has currently uncontrolled symptomatic type 2 DM since  69 years of age. She reports fasting blood glucose ranging from 63-1 83, bedtime readings between 137-249.  Her recent A1c was 9%, increasing from 8.3%.    Recent labs reviewed. She reports fluctuating glycemic profile including some hypoglycemic episodes. - I had a long discussion with her about the progressive nature of diabetes and the pathology behind its complications. -her diabetes is complicated by coronary artery disease, chronic active smoking, and she remains at a high risk for more acute and chronic complications which include CAD, CVA, CKD, retinopathy, and neuropathy. These are all discussed in detail with her.  - I have counseled her on diet  by adopting a carbohydrate restricted/protein rich diet. Patient is encouraged to switch to  unprocessed or minimally processed     complex starch and increased protein intake (animal or plant source), fruits, and vegetables. -  she is advised to stick to a routine mealtimes to eat 3 meals  a day and avoid unnecessary snacks ( to snack only to correct hypoglycemia).  - she  admits there is a room for improvement in her diet and drink choices. -  Suggestion is made for her to avoid simple carbohydrates  from her diet including Cakes, Sweet Desserts / Pastries, Ice Cream, Soda (diet and regular), Sweet Tea, Candies, Chips, Cookies, Sweet Pastries,  Store Bought Juices, Alcohol in Excess of  1-2 drinks a day, Artificial Sweeteners, Coffee Creamer, and "Sugar-free" Products. This will help patient  to have stable blood glucose profile and potentially avoid unintended weight gain.    - she will be scheduled with Jearld Fenton, RDN, CDE for diabetes education.  - I have approached her with the following individualized plan to manage  her diabetes and patient agrees:   - she believes she did have a better control when she was taking Novolin 70/30.  She will be switched to this product after she uses her current supplies of Lantus.  She does have 10 pens of Lantus left, currently taking 25 units daily at breakfast.  She will be allowed to continue on the same program.  She will test blood glucose twice a day daily before breakfast and at bedtime.  She is requesting and will benefit from a CGM device.  I discussed and prescribed the freestyle libre device for her.   - she is encouraged to call clinic for fasting blood glucose levels less than 70 or above 200 mg /dl. - she is not a candidate for metformin, SGLT2 inhibitors, nor incretin therapy due to her body habitus.   - Specific targets for  A1c;  LDL, HDL, Triglycerides, and  Waist Circumference were discussed with the patient.  2) Blood Pressure /Hypertension:   she is advised to home monitor blood pressure and report if > 140/90 on 2 separate readings.     she is advised to continue her current medications including losartan 100 mg p.o. daily with breakfast as  well as amlodipine 5 mg p.o. daily at breakfast.  3) Lipids/Hyperlipidemia:   Review of her recent lipid panel showed  controlled  LDL at 73 .  she  is advised to continue pravastatin 20 mg p.o. daily at bedtime.   - Side effects and precautions discussed with her.  4)  Weight/Diet:  She Is not a candidate for weight loss.   Exercise, and detailed carbohydrates information provided  -  detailed on discharge instructions.  5) Chronic Care/Health Maintenance:  -she  is on ACEI/ARB and Statin medications and  is encouraged to initiate and continue to follow up with  Ophthalmology, Dentist,  Podiatrist at least yearly or according to recommendations, and advised to  quit stay away from smoking. I have recommended yearly flu vaccine and pneumonia vaccine at least every 5 years; and  sleep for at least 7 hours a day. -She is extensively counseled for smoking cessation.  - she is  advised to maintain close follow up with Sharion Balloon, FNP for primary care needs, as well as her other providers for optimal and coordinated care.  - Time spent on this patient care encounter:  35 min, of which >50% was spent in  counseling and the rest reviewing her  current and  previous labs/studies ( including abstraction from other facilities),  previous treatments, her blood glucose readings, and medications' doses and developing a plan for long-term care based on the latest recommendations for standards of care; and documenting her care.  Jamie [name] participated in the discussions, expressed understanding, and voiced agreement with the above plans.  All questions were answered to her satisfaction. she is encouraged to contact clinic should she have any questions or concerns prior to her return visit.    Follow up plan: - Return in about 3 months (around 03/01/2020) for Bring Meter and Logs- A1c in Office.  Glade Lloyd, MD [facility] [address] [place], [place] [address] Phone: [phone]  Fax: [phone]    12/03/2019, 11:20 AM  This note was partially dictated with voice recognition software. Similar sounding words can be transcribed inadequately or may not  be corrected upon review.

## 2020-01-05 ENCOUNTER — Ambulatory Visit (INDEPENDENT_AMBULATORY_CARE_PROVIDER_SITE_OTHER): Payer: Medicare HMO | Admitting: *Deleted

## 2020-01-05 DIAGNOSIS — I10 Essential (primary) hypertension: Secondary | ICD-10-CM | POA: Diagnosis not present

## 2020-01-05 DIAGNOSIS — H9193 Unspecified hearing loss, bilateral: Secondary | ICD-10-CM

## 2020-01-05 DIAGNOSIS — E1159 Type 2 diabetes mellitus with other circulatory complications: Secondary | ICD-10-CM

## 2020-01-05 NOTE — Patient Instructions (Signed)
Visit Information  Goals Addressed            This Visit's Progress     Patient Stated   . "I would like to be able to hear better" (pt-stated)       Current Barriers:  . Film/video editor.  . Chronic Disease Management support and education needs related to hearing loss and need for hearing aids  Nurse Case Manager Clinical Goal(s):  Marland Kitchen Over the next 30 days, patient will work with [place] to address needs related to obtaining hearing aids.   Interventions:  . Chart Reviewed . Talked with patient by phone . Discussed hx of hearing loss . Care guide referral for assistance with affordable hearing aids  Patient Self Care Activities:  . Self administers medications as prescribed . Attends all scheduled provider appointments . Calls provider office for new concerns or questions  Initial goal documentation     . "I would like to control my blood sugar better" (pt-stated)       Current Barriers:  . Chronic Disease Management support and education needs related to diabetes . Cost associated with diabetes management . Hearing loss  Nurse Case Manager Clinical Goal(s):  Marland Kitchen Over the next 90 days, patient will work with Dr Dorris Fetch to address needs related to diabetes management . Over the next 30 days, patient will work with nutritionist/diabetes educator  Interventions:  . Evaluation of current treatment plan related to diabetes and patient's adherence to plan as established by provider. . Reviewed medications and discussed Lantus and Novolin 70/30 o Patient is prescribed Lantus 25 units and injects it each morning o States that she has 9 full pens of lantus left. I questioned if she is taking it as prescribed because at her visit with Dr Dorris Fetch last month she reported having 10 pens. - Injecting 25 units daily one pen should last 12 days so she should have less than 8 full pens at this time o Patient prefers to use Novolin 70/30 and not Lantus. Reiterated that Dr Dorris Fetch  documented that he would switch her back to Novolin once she has used he current supply of Lantus . Discussed Dr Liliane Channel recommendation to follow-up with Jearld Fenton for diabetes education. . RN will coordinate appointment with [facility] . Discussed plans with patient for ongoing care management follow up and provided patient with direct contact information for care management team . Reviewed scheduled/upcoming provider appointments including: Evelina Dun, FNP on 01/31/20 and Dr Dorris Fetch on 03/01/2020 . Advised patient, providing education and rationale, to check cbg twice daily as instructed by Dr Dorris Fetch and record. Take blood sugar meter and log to appointment on 2/19. . Advised to reach out to Dr Dorris Fetch with blood sugar readings below 70 or above 200  Patient Self Care Activities:  . Self administers medications as prescribed . Calls pharmacy for medication refills . Calls provider office for new concerns or questions  Please see past updates related to this goal by clicking on the "Past Updates" button in the selected goal         The patient verbalized understanding of instructions provided today and declined a print copy of patient instruction materials.   Follow-up Plan The care management team will reach out to the patient again over the next 45 days.   Chong Sicilian, BSN, RN-BC Embedded Chronic Care Manager [facility] Direct Dial: [phone]

## 2020-01-05 NOTE — Chronic Care Management (AMB) (Addendum)
Chronic Care Management   Follow Up Note   01/05/2020 Name: Jamie [name] MRN: [id number] DOB: 01-Feb-1951  Referred by: Jamie Balloon, FNP Reason for referral : Chronic Care Management   Jamie [name] is a 69 y.o. year old female who is a primary care patient of Jamie Balloon, FNP. The CCM team was consulted for assistance with chronic disease management and care coordination needs.    Review of patient status, including review of consultants reports, relevant laboratory and other test results, and collaboration with appropriate care team members and the patient's provider was performed as part of comprehensive patient evaluation and provision of chronic care management services.    SDOH (Social Determinants of Health) assessments performed: No See Care Plan activities for detailed interventions related to [place])     I spoke with Jamie [name] by telephone today regarding her diabetes management. She has hearing loss which makes telephone visits difficult.   Outpatient Encounter Medications as of 01/05/2020  Medication Sig   Accu-Chek FastClix Lancets MISC Use to check blood sugar two times daily  Dx Code E11.9   albuterol (PROVENTIL) (2.5 MG/3ML) 0.083% nebulizer solution Take 3 mLs (2.5 mg total) by nebulization every 6 (six) hours as needed for wheezing or shortness of breath.   Alcohol Swabs (B-D SINGLE USE SWABS REGULAR) PADS Test BS BID Dx E11.9   amLODipine (NORVASC) 5 MG tablet Take 1 tablet (5 mg total) by mouth daily.   Blood Glucose Calibration (ACCU-CHEK AVIVA) SOLN Use with glucose machine Dx E11.9   Blood Glucose Monitoring Suppl (ACCU-CHEK AVIVA PLUS) w/Device KIT Check BS BID Dx E11.9   gabapentin (NEURONTIN) 100 MG capsule Take 1 capsule (100 mg total) by mouth 2 (two) times daily.   glucose blood (ACCU-CHEK AVIVA PLUS) test strip Check BS BID Dx E11.9   Insulin Glargine (LANTUS) 100 UNIT/ML Solostar Pen Inject 25 Units into the skin daily with breakfast.   Insulin Pen  Needle (PEN NEEDLES) 32G X 4 MM MISC Inject 1 applicator into the skin daily. Dx E11.9   losartan (COZAAR) 100 MG tablet Take 1 tablet (100 mg total) by mouth daily.   methocarbamol (ROBAXIN) 500 MG tablet Take 1 tablet (500 mg total) by mouth every 6 (six) hours as needed for muscle spasms.   pravastatin (PRAVACHOL) 20 MG tablet Take 1 tablet (20 mg total) by mouth daily.   No facility-administered encounter medications on file as of 01/05/2020.      RN Assessment & Care Plan      "I would like to be able to hear better" (pt-stated)       Current Barriers:  Film/video editor.  Chronic Disease Management support and education needs related to hearing loss and need for hearing aids  Nurse Case Manager Clinical Goal(s):  Over the next 30 days, patient will work with [place] to address needs related to obtaining hearing aids.   Interventions:  Chart Reviewed Talked with patient by phone Discussed hx of hearing loss Care guide referral for assistance with affordable hearing aids  Patient Self Care Activities:  Self administers medications as prescribed Attends all scheduled provider appointments Calls provider office for new concerns or questions  Initial goal documentation      "I would like to control my blood sugar better" (pt-stated)       Current Barriers:  Chronic Disease Management support and education needs related to diabetes Cost associated with diabetes management Hearing loss  Nurse Case Manager Clinical Goal(s):  Over the next 90 days, patient will work with Dr Dorris Fetch to address needs related to diabetes management Over the next 30 days, patient will work with nutritionist/diabetes educator  Interventions:  Evaluation of current treatment plan related to diabetes and patient's adherence to plan as established by provider. Reviewed medications and discussed Lantus and Novolin 70/30 Patient is prescribed Lantus 25 units and injects it each morning States  that she has 9 full pens of lantus left. I questioned if she is taking it as prescribed because at her visit with Dr Dorris Fetch last month she reported having 10 pens. Injecting 25 units daily one pen should last 12 days so she should have less than 8 full pens at this time Patient prefers to use Novolin 70/30 and not Lantus. Reiterated that Dr Dorris Fetch documented that he would switch her back to Novolin once she has used he current supply of Lantus Discussed Dr Liliane Channel recommendation to follow-up with Jearld Fenton for diabetes education. RN will coordinate appointment with Kieth Brightly Crumptom Discussed plans with patient for ongoing care management follow up and provided patient with direct contact information for care management team Reviewed scheduled/upcoming provider appointments including: Evelina Dun, FNP on 01/31/20 and Dr Dorris Fetch on 03/01/2020 Advised patient, providing education and rationale, to check cbg twice daily as instructed by Dr Dorris Fetch and record. Take blood sugar meter and log to appointment on 2/19. Advised to reach out to Dr Dorris Fetch with blood sugar readings below 70 or above 200  Patient Self Care Activities:  Self administers medications as prescribed Calls pharmacy for medication refills Calls provider office for new concerns or questions  Please see past updates related to this goal by clicking on the "Past Updates" button in the selected goal           Plan:   The care management team will reach out to the patient again over the next 45 days.    Chong Sicilian, BSN, RN-BC Embedded Chronic Care Manager [facility] [place] [facility] Direct Dial: [phone]    I have reviewed and agree with the above documentation.   Evelina Dun, FNP

## 2020-01-06 ENCOUNTER — Telehealth: Payer: Self-pay | Admitting: Family

## 2020-01-06 NOTE — Telephone Encounter (Signed)
   KNB 01/06/2020  Name: Jamie [name]   MRN: [id number]   DOB: 1951-06-04   AGE: 69 y.o.   GENDER: female   PCP Sharion Balloon, FNP.   Called pt regarding Community Resource Referral for hearing aid assistance. Pt stated that she cancelled her nutrition referral today because the co-pay was $45 and she cannot afford it Notifying RN  Mrs. Woolums stated that she received SSI $1281 and her husband also receives SSI $2000 in addition to her husband working part time and earning $19 an hour. They are not eligible for Medicaid. When asked about audiogram she said that she had her last test when she was 14 and was told that the amount would be $6000 and she could enroll in a payment plan but could not have hearing aids until it was paid off.  She is currently working towards paying off her car loan of $500/mo and will re-visit hearing aids after that is paid off (within a year) [place] will send letter to Mrs. Woloszyn with information on Audiogram testing nearby as well as [employer].  Closing referral pending any other needs of patient.    [place] . [email or web address]  [phone]

## 2020-01-06 NOTE — Telephone Encounter (Signed)
Spoke with Sharyn Lull at [facility] answered questions regarding resources and free audiogram options M-TH 8-4 FRI 8-5

## 2020-01-06 NOTE — Telephone Encounter (Signed)
   KNB 01/06/2020 Name: Jamie [name]   MRN: [id number]   DOB: 04-26-1951   AGE: 69 y.o.   GENDER: female   PCP Sharion Balloon, FNP.   Called [employer] to research program requirements for hearing aid assistance LMTCB Follow up on: 01/07/20  [place] . [email or web address]  [phone]

## 2020-01-31 ENCOUNTER — Ambulatory Visit: Payer: Medicare HMO | Admitting: Family

## 2020-02-01 ENCOUNTER — Other Ambulatory Visit: Payer: Self-pay | Admitting: Family

## 2020-02-01 DIAGNOSIS — Z1231 Encounter for screening mammogram for malignant neoplasm of breast: Secondary | ICD-10-CM

## 2020-02-03 ENCOUNTER — Ambulatory Visit: Payer: Medicare HMO | Admitting: *Deleted

## 2020-02-03 DIAGNOSIS — I10 Essential (primary) hypertension: Secondary | ICD-10-CM

## 2020-02-03 DIAGNOSIS — H9193 Unspecified hearing loss, bilateral: Secondary | ICD-10-CM

## 2020-02-03 DIAGNOSIS — E1159 Type 2 diabetes mellitus with other circulatory complications: Secondary | ICD-10-CM

## 2020-02-25 ENCOUNTER — Ambulatory Visit (INDEPENDENT_AMBULATORY_CARE_PROVIDER_SITE_OTHER): Payer: Medicare HMO | Admitting: Family

## 2020-02-25 ENCOUNTER — Other Ambulatory Visit: Payer: Self-pay

## 2020-02-25 ENCOUNTER — Encounter: Payer: Self-pay | Admitting: Family

## 2020-02-25 VITALS — BP 162/78 | HR 83 | Temp 97.2°F | Ht 61.0 in | Wt 101.0 lb

## 2020-02-25 DIAGNOSIS — E0865 Diabetes mellitus due to underlying condition with hyperglycemia: Secondary | ICD-10-CM | POA: Diagnosis not present

## 2020-02-25 DIAGNOSIS — I10 Essential (primary) hypertension: Secondary | ICD-10-CM | POA: Diagnosis not present

## 2020-02-25 DIAGNOSIS — J441 Chronic obstructive pulmonary disease with (acute) exacerbation: Secondary | ICD-10-CM | POA: Diagnosis not present

## 2020-02-25 DIAGNOSIS — M5416 Radiculopathy, lumbar region: Secondary | ICD-10-CM

## 2020-02-25 DIAGNOSIS — J449 Chronic obstructive pulmonary disease, unspecified: Secondary | ICD-10-CM

## 2020-02-25 DIAGNOSIS — I2510 Atherosclerotic heart disease of native coronary artery without angina pectoris: Secondary | ICD-10-CM | POA: Diagnosis not present

## 2020-02-25 DIAGNOSIS — E1169 Type 2 diabetes mellitus with other specified complication: Secondary | ICD-10-CM | POA: Diagnosis not present

## 2020-02-25 DIAGNOSIS — Z716 Tobacco abuse counseling: Secondary | ICD-10-CM

## 2020-02-25 DIAGNOSIS — E785 Hyperlipidemia, unspecified: Secondary | ICD-10-CM | POA: Diagnosis not present

## 2020-02-25 DIAGNOSIS — K219 Gastro-esophageal reflux disease without esophagitis: Secondary | ICD-10-CM

## 2020-02-25 DIAGNOSIS — E1159 Type 2 diabetes mellitus with other circulatory complications: Secondary | ICD-10-CM

## 2020-02-25 DIAGNOSIS — I2584 Coronary atherosclerosis due to calcified coronary lesion: Secondary | ICD-10-CM | POA: Diagnosis not present

## 2020-02-25 DIAGNOSIS — F17200 Nicotine dependence, unspecified, uncomplicated: Secondary | ICD-10-CM

## 2020-02-25 DIAGNOSIS — R636 Underweight: Secondary | ICD-10-CM

## 2020-02-25 DIAGNOSIS — H9193 Unspecified hearing loss, bilateral: Secondary | ICD-10-CM

## 2020-02-25 DIAGNOSIS — G8929 Other chronic pain: Secondary | ICD-10-CM

## 2020-02-25 DIAGNOSIS — M5441 Lumbago with sciatica, right side: Secondary | ICD-10-CM

## 2020-02-25 LAB — BAYER DCA HB A1C WAIVED: HB A1C (BAYER DCA - WAIVED): 8.1 % — ABNORMAL HIGH (ref <7.0)

## 2020-02-25 MED ORDER — METHOCARBAMOL 500 MG PO TABS: 500.0000 mg | ORAL_TABLET | Freq: Four times a day (QID) | ORAL | 3 refills | Status: DC | PRN

## 2020-02-25 MED ORDER — INSULIN GLARGINE 100 UNIT/ML SOLOSTAR PEN: 25.0000 [IU] | PEN_INJECTOR | Freq: Every day | SUBCUTANEOUS | 3 refills | Status: DC

## 2020-02-25 MED ORDER — LOSARTAN POTASSIUM 100 MG PO TABS: 100.0000 mg | ORAL_TABLET | Freq: Every day | ORAL | 1 refills | Status: DC

## 2020-02-25 MED ORDER — AMLODIPINE BESYLATE 5 MG PO TABS: 5.0000 mg | ORAL_TABLET | Freq: Every day | ORAL | 1 refills | Status: DC

## 2020-02-25 MED ORDER — GABAPENTIN 100 MG PO CAPS: 100.0000 mg | ORAL_CAPSULE | Freq: Two times a day (BID) | ORAL | 1 refills | Status: DC

## 2020-02-25 MED ORDER — SPIRIVA HANDIHALER 18 MCG IN CAPS
18.0000 ug | ORAL_CAPSULE | Freq: Every day | RESPIRATORY_TRACT | 12 refills | Status: DC
Start: 2020-02-25 — End: 2020-06-13

## 2020-02-25 MED ORDER — PRAVASTATIN SODIUM 20 MG PO TABS: 20.0000 mg | ORAL_TABLET | Freq: Every day | ORAL | 1 refills | Status: DC

## 2020-02-25 NOTE — Progress Notes (Signed)
Subjective:    Patient ID: Jamie [name], female    DOB: [birth year], 69 y.o.   MRN: [id number]  Chief Complaint  Patient presents with  . Medical Management of Chronic Issues   PT presents to the office today for chronic follow up.PT had surgical decompression and stabilization from L2-L4 on 02/05/17. Pt states her back continues to hurtand her pain is a8 out 10 when she walks or standing.  Her A1C is elevated today. She is followed by  Endocrinologists.  Diabetes She presents for her follow-up diabetic visit. She has type 2 diabetes mellitus. Her disease course has been stable. There are no hypoglycemic associated symptoms. Pertinent negatives for diabetes include no blurred vision, no foot paresthesias and no visual change. Symptoms are stable. Diabetic complications include heart disease. Risk factors for coronary artery disease include dyslipidemia, diabetes mellitus, hypertension, sedentary lifestyle and post-menopausal. She is following a generally healthy diet. Her overall blood glucose range is 110-130 mg/dl. An ACE inhibitor/angiotensin II receptor blocker is being taken.  Hypertension This is a chronic problem. The current episode started more than 1 year ago. The problem has been waxing and waning since onset. The problem is uncontrolled. Associated symptoms include malaise/fatigue and shortness of breath. Pertinent negatives include no blurred vision or peripheral edema. Risk factors for coronary artery disease include dyslipidemia, diabetes mellitus, sedentary lifestyle and obesity. The current treatment provides moderate improvement. Hypertensive end-organ damage includes CAD/MI.  Hyperlipidemia This is a chronic problem. The current episode started more than 1 year ago. The problem is controlled. Recent lipid tests were reviewed and are normal. Associated symptoms include shortness of breath. Current antihyperlipidemic treatment includes statins. The current treatment provides  moderate improvement of lipids. Risk factors for coronary artery disease include dyslipidemia, diabetes mellitus, hypertension, a sedentary lifestyle and post-menopausal.  Gastroesophageal Reflux She complains of belching and heartburn. This is a chronic problem. The current episode started more than 1 year ago. The problem occurs occasionally. The problem has been waxing and waning. She has tried a PPI for the symptoms. The treatment provided moderate relief.  Nicotine Dependence Presents for follow-up visit. Her urge triggers include company of smokers. She smokes 1 pack of cigarettes per day.  COPD Continues to smoke 1 pack a day. States she has intermittent SOB.     Review of Systems  Constitutional: Positive for malaise/fatigue.  Eyes: Negative for blurred vision.  Respiratory: Positive for shortness of breath.   Gastrointestinal: Positive for heartburn.  All other systems reviewed and are negative.      Objective:   Physical Exam Vitals reviewed.  Constitutional:      General: She is not in acute distress.    Appearance: She is well-developed.     Comments: underweight  HENT:     Head: Normocephalic and atraumatic.     Right Ear: Tympanic membrane normal.     Left Ear: Tympanic membrane normal.     Ears:     Comments: HOH  Eyes:     Pupils: Pupils are equal, round, and reactive to light.  Neck:     Thyroid: No thyromegaly.  Cardiovascular:     Rate and Rhythm: Normal rate and regular rhythm.     Heart sounds: Normal heart sounds. No murmur.  Pulmonary:     Effort: Pulmonary effort is normal. No respiratory distress.     Breath sounds: Normal breath sounds. No wheezing.  Abdominal:     General: Bowel sounds are normal. There is no distension.  Palpations: Abdomen is soft.     Tenderness: There is no abdominal tenderness.  Musculoskeletal:        General: No tenderness.     Cervical back: Normal range of motion and neck supple.     Comments: Pain in lumbar  with flexion and extension, pain right hip with external rotation  Skin:    General: Skin is warm and dry.  Neurological:     Mental Status: She is alert and oriented to person, place, and time.     Cranial Nerves: No cranial nerve deficit.     Deep Tendon Reflexes: Reflexes are normal and symmetric.  Psychiatric:        Behavior: Behavior normal.        Thought Content: Thought content normal.        Judgment: Judgment normal.       BP (!) 162/78   Pulse 83   Temp (!) 97.2 F (36.2 C) (Temporal)   Ht _0  (1.549 m)   Wt 101 lb (45.8 kg)   SpO2 94%   BMI 19.08 kg/m      Assessment & Plan:  Jamie [name] comes in today with chief complaint of Medical Management of Chronic Issues   Diagnosis and orders addressed:  1. Essential hypertension - amLODipine (NORVASC) 5 MG tablet; Take 1 tablet (5 mg total) by mouth daily.  Dispense: 90 tablet; Refill: 1 - losartan (COZAAR) 100 MG tablet; Take 1 tablet (100 mg total) by mouth daily.  Dispense: 90 tablet; Refill: 1 - CMP14+EGFR - CBC with Differential/Platelet  2. Hyperlipidemia associated with type 2 diabetes mellitus (HCC) - pravastatin (PRAVACHOL) 20 MG tablet; Take 1 tablet (20 mg total) by mouth daily.  Dispense: 90 tablet; Refill: 1 - CMP14+EGFR - CBC with Differential/Platelet  3. Lumbar radiculopathy, chronic - gabapentin (NEURONTIN) 100 MG capsule; Take 1 capsule (100 mg total) by mouth 2 (two) times daily.  Dispense: 60 capsule; Refill: 1 - methocarbamol (ROBAXIN) 500 MG tablet; Take 1 tablet (500 mg total) by mouth every 6 (six) hours as needed for muscle spasms.  Dispense: 40 tablet; Refill: 3 - CMP14+EGFR - CBC with Differential/Platelet  4. Coronary artery calcification - CMP14+EGFR - CBC with Differential/Platelet  5. DM type 2 causing vascular disease (HCC) - insulin glargine (LANTUS) 100 UNIT/ML Solostar Pen; Inject 25 Units into the skin daily with breakfast.  Dispense: 15 mL; Refill: 3 - Bayer DCA Hb  A1c Waived - CMP14+EGFR - CBC with Differential/Platelet  6. Essential hypertension, benign - CMP14+EGFR - CBC with Differential/Platelet  7. COPD with acute exacerbation (HCC) - CMP14+EGFR - CBC with Differential/Platelet  8. Moderate COPD (chronic obstructive pulmonary disease) (HCC) - CMP14+EGFR - CBC with Differential/Platelet  9. Gastroesophageal reflux disease without esophagitis - CMP14+EGFR - CBC with Differential/Platelet  10. Diabetes mellitus due to underlying condition, uncontrolled, with hyperglycemia, without long-term current use of insulin (HCC) - CMP14+EGFR - CBC with Differential/Platelet  11. Bilateral hearing loss, unspecified hearing loss type - CMP14+EGFR - CBC with Differential/Platelet  12. Chronic bilateral low back pain with right-sided sciatica - methocarbamol (ROBAXIN) 500 MG tablet; Take 1 tablet (500 mg total) by mouth every 6 (six) hours as needed for muscle spasms.  Dispense: 40 tablet; Refill: 3 - CMP14+EGFR - CBC with Differential/Platelet  13. Current smoker  - CMP14+EGFR - CBC with Differential/Platelet  14. Tobacco abuse counseling - CMP14+EGFR - CBC with Differential/Platelet  15. Underweight - CMP14+EGFR - CBC with Differential/Platelet   Labs pending Health Maintenance  reviewed Diet and exercise encouraged  Follow up plan: 6 months    Evelina Dun, FNP

## 2020-02-25 NOTE — Patient Instructions (Signed)

## 2020-02-26 LAB — CBC WITH DIFFERENTIAL/PLATELET
Basophils Absolute: 0.1 10*3/uL (ref 0.0–0.2)
Basos: 1 %
EOS (ABSOLUTE): 0.1 10*3/uL (ref 0.0–0.4)
Eos: 1 %
Hematocrit: 41.9 % (ref 34.0–46.6)
Hemoglobin: 13.9 g/dL (ref 11.1–15.9)
Immature Grans (Abs): 0 10*3/uL (ref 0.0–0.1)
Immature Granulocytes: 0 %
Lymphocytes Absolute: 1.5 10*3/uL (ref 0.7–3.1)
Lymphs: 21 %
MCH: 26.9 pg (ref 26.6–33.0)
MCHC: 33.2 g/dL (ref 31.5–35.7)
MCV: 81 fL (ref 79–97)
Monocytes Absolute: 0.5 10*3/uL (ref 0.1–0.9)
Monocytes: 7 %
Neutrophils Absolute: 5 10*3/uL (ref 1.4–7.0)
Neutrophils: 70 %
Platelets: 224 10*3/uL (ref 150–450)
RBC: 5.16 x10E6/uL (ref 3.77–5.28)
RDW: 13.2 % (ref 11.7–15.4)
WBC: 7.1 10*3/uL (ref 3.4–10.8)

## 2020-02-26 LAB — CMP14+EGFR
ALT: 10 IU/L (ref 0–32)
AST: 14 IU/L (ref 0–40)
Albumin/Globulin Ratio: 1.8 (ref 1.2–2.2)
Albumin: 4.2 g/dL (ref 3.8–4.8)
Alkaline Phosphatase: 128 IU/L — ABNORMAL HIGH (ref 39–117)
BUN/Creatinine Ratio: 17 (ref 12–28)
BUN: 13 mg/dL (ref 8–27)
Bilirubin Total: 0.4 mg/dL (ref 0.0–1.2)
CO2: 23 mmol/L (ref 20–29)
Calcium: 9.6 mg/dL (ref 8.7–10.3)
Chloride: 101 mmol/L (ref 96–106)
Creatinine, Ser: 0.75 mg/dL (ref 0.57–1.00)
GFR calc Af Amer: 95 mL/min/{1.73_m2} (ref >59)
GFR calc non Af Amer: 83 mL/min/{1.73_m2} (ref >59)
Globulin, Total: 2.3 g/dL (ref 1.5–4.5)
Glucose: 123 mg/dL — ABNORMAL HIGH (ref 65–99)
Potassium: 4.3 mmol/L (ref 3.5–5.2)
Sodium: 140 mmol/L (ref 134–144)
Total Protein: 6.5 g/dL (ref 6.0–8.5)

## 2020-02-29 ENCOUNTER — Telehealth: Payer: Self-pay | Admitting: Family

## 2020-02-29 NOTE — Telephone Encounter (Signed)
Pt returned missed call from [facility] regarding lab results. Reviewed results with pt per Christys notes. Pt voiced understanding. 

## 2020-03-01 ENCOUNTER — Ambulatory Visit: Payer: Medicare HMO | Admitting: "Endocrinology

## 2020-03-06 ENCOUNTER — Encounter: Payer: Self-pay | Admitting: "Endocrinology

## 2020-03-06 ENCOUNTER — Other Ambulatory Visit: Payer: Self-pay

## 2020-03-06 ENCOUNTER — Ambulatory Visit (INDEPENDENT_AMBULATORY_CARE_PROVIDER_SITE_OTHER): Payer: Medicare HMO | Admitting: "Endocrinology

## 2020-03-06 VITALS — BP 149/79 | HR 77 | Ht 61.0 in | Wt 101.8 lb

## 2020-03-06 DIAGNOSIS — E118 Type 2 diabetes mellitus with unspecified complications: Secondary | ICD-10-CM

## 2020-03-06 DIAGNOSIS — E0865 Diabetes mellitus due to underlying condition with hyperglycemia: Secondary | ICD-10-CM | POA: Diagnosis not present

## 2020-03-06 DIAGNOSIS — F17200 Nicotine dependence, unspecified, uncomplicated: Secondary | ICD-10-CM | POA: Diagnosis not present

## 2020-03-06 DIAGNOSIS — E1159 Type 2 diabetes mellitus with other circulatory complications: Secondary | ICD-10-CM

## 2020-03-06 DIAGNOSIS — I10 Essential (primary) hypertension: Secondary | ICD-10-CM | POA: Diagnosis not present

## 2020-03-06 DIAGNOSIS — E782 Mixed hyperlipidemia: Secondary | ICD-10-CM | POA: Diagnosis not present

## 2020-03-06 MED ORDER — INSULIN GLARGINE 100 UNIT/ML SOLOSTAR PEN: 25.0000 [IU] | PEN_INJECTOR | Freq: Every day | SUBCUTANEOUS | 1 refills | Status: DC

## 2020-03-06 MED ORDER — PEN NEEDLES 32G X 4 MM MISC: 1 refills | Status: DC

## 2020-03-06 NOTE — Progress Notes (Signed)
03/06/2020, 12:52 PM   Endocrinology follow-up note   Subjective:    Patient ID: Jamie [name], female    DOB: 05/15/1951.  EVIA GOLDSMITH is being seen in follow-up for the management of currently uncontrolled type 2 diabetes, hyperlipidemia, hypertension.   PCP: Sharion Balloon, FNP.   Past Medical History:  Diagnosis Date  . Anxiety   . Breast cancer ([place])   . COPD (chronic obstructive pulmonary disease) ([place])   . Deaf   . Depression   . Diabetes ([place])   . HOH (hard of hearing)   . HTN (hypertension)   . Hyperlipidemia   . Personal history of radiation therapy   . Vitamin D deficiency     Past Surgical History:  Procedure Laterality Date  . APPLICATION OF ROBOTIC ASSISTANCE FOR SPINAL PROCEDURE N/A 02/23/2019   Procedure: APPLICATION OF ROBOTIC ASSISTANCE FOR SPINAL PROCEDURE;  Surgeon: Kristeen Miss, MD;  Location: [facility];  Service: Neurosurgery;  Laterality: N/A;  . BACK SURGERY    . BREAST SURGERY     radiation  . CARPAL TUNNEL RELEASE    . CERVICAL SPINE SURGERY    . EYE SURGERY Bilateral    cataracts  . GALLBLADDER SURGERY    . INNER EAR SURGERY    . POSTERIOR LUMBAR FUSION 4 LEVEL N/A 02/23/2019   Procedure: Thoracic ten to Lumbar two decompression with Posterior lumbar interbody fusion lumbar one-lumbar two with Osteotomy at Lumbar one-Lumbar two, Posterior arthrodesis Thoracic ten to Lumbar two with Mazor;  Surgeon: Kristeen Miss, MD;  Location: [facility];  Service: Neurosurgery;  Laterality: N/A;  . SPINE SURGERY      Social History   Socioeconomic History  . Marital status: Married    Spouse name: Not on file  . Number of children: 1  . Years of education: 29  . Highest education level: High school graduate  Occupational History  . Occupation: disability  Tobacco Use  . Smoking status: Current Every Day Smoker    Packs/day: 1.00    Years: 51.00    Pack years: 51.00    Types:  Cigarettes  . Smokeless tobacco: Never Used  Substance and Sexual Activity  . Alcohol use: No  . Drug use: No  . Sexual activity: Not Currently    Birth control/protection: None  Other Topics Concern  . Not on file  Social History Narrative   Retired/disabled. Lives at home with her husband. He still works part-time. Currently following social distancing guidelines.    Social Determinants of Health   Financial Resource Strain: Low Risk   . Difficulty of Paying Living Expenses: Not hard at all  Food Insecurity: No Food Insecurity  . Worried About Charity fundraiser in the Last Year: Never true  . Ran Out of Food in the Last Year: Never true  Transportation Needs: No Transportation Needs  . Lack of Transportation (Medical): No  . Lack of Transportation (Non-Medical): No  Physical Activity: Inactive  . Days of Exercise per Week: 0 days  . Minutes of Exercise per Session: 0 min  Stress: Stress Concern Present  . Feeling of Stress : To some extent  Social Connections: Somewhat Isolated  .  Frequency of Communication with Friends and Family: More than three times a week  . Frequency of Social Gatherings with Friends and Family: More than three times a week  . Attends Religious Services: Never  . Active Member of Clubs or Organizations: No  . Attends Archivist Meetings: Never  . Marital Status: Married    Family History  Problem Relation Age of Onset  . Heart attack Mother   . Stroke Mother   . Stroke Sister   . Diabetes Other        sister x3, and mother  . Lung disease Father        worked in [employer]  . Colon cancer Neg Hx   . Colon polyps Neg Hx     Outpatient Encounter Medications as of 03/06/2020  Medication Sig  . Accu-Chek FastClix Lancets MISC Use to check blood sugar two times daily  Dx Code E11.9  . albuterol (PROVENTIL) (2.5 MG/3ML) 0.083% nebulizer solution Take 3 mLs (2.5 mg total) by nebulization every 6 (six) hours as needed for wheezing or  shortness of breath.  . Alcohol Swabs (B-D SINGLE USE SWABS REGULAR) PADS Test BS BID Dx E11.9  . amLODipine (NORVASC) 5 MG tablet Take 1 tablet (5 mg total) by mouth daily.  . Blood Glucose Calibration (ACCU-CHEK AVIVA) SOLN Use with glucose machine Dx E11.9  . Blood Glucose Monitoring Suppl (ACCU-CHEK AVIVA PLUS) w/Device KIT Check BS BID Dx E11.9  . gabapentin (NEURONTIN) 100 MG capsule Take 1 capsule (100 mg total) by mouth 2 (two) times daily.  Marland Kitchen glucose blood (ACCU-CHEK AVIVA PLUS) test strip Check BS BID Dx E11.9  . insulin glargine (LANTUS) 100 UNIT/ML Solostar Pen Inject 25 Units into the skin daily with breakfast.  . Insulin Pen Needle (PEN NEEDLES) 32G X 4 MM MISC Use to inject insulin once a day.  . losartan (COZAAR) 100 MG tablet Take 1 tablet (100 mg total) by mouth daily.  . pravastatin (PRAVACHOL) 20 MG tablet Take 1 tablet (20 mg total) by mouth daily.  Marland Kitchen tiotropium (SPIRIVA HANDIHALER) 18 MCG inhalation capsule Place 1 capsule (18 mcg total) into inhaler and inhale daily.  . [DISCONTINUED] insulin glargine (LANTUS) 100 UNIT/ML Solostar Pen Inject 25 Units into the skin daily with breakfast.  . [DISCONTINUED] Insulin Pen Needle (PEN NEEDLES) 32G X 4 MM MISC Inject 1 applicator into the skin daily. Dx E11.9  . [DISCONTINUED] methocarbamol (ROBAXIN) 500 MG tablet Take 1 tablet (500 mg total) by mouth every 6 (six) hours as needed for muscle spasms.   No facility-administered encounter medications on file as of 03/06/2020.    ALLERGIES: Allergies  Allergen Reactions  . Lipitor [Atorvastatin] Other (See Comments)    Constipation   . Soma [Carisoprodol] Nausea And Vomiting    VACCINATION STATUS: Immunization History  Administered Date(s) Administered  . Fluad Quad(high Dose 65+) 07/26/2019  . Influenza Split 08/14/2012, 07/17/2013  . Influenza, High Dose Seasonal PF 07/28/2018  . Influenza,inj,Quad PF,6+ Mos 07/11/2014, 07/14/2015, 07/15/2016, 07/22/2017  . Moderna  SARS-COVID-2 Vaccination 12/09/2019, 01/07/2020  . Pneumococcal Conjugate-13 02/13/2015  . Pneumococcal Polysaccharide-23 10/14/2002, 01/29/2018  . Zoster Recombinat (Shingrix) 09/16/2018    Diabetes She presents for her follow-up diabetic visit. She has type 2 diabetes mellitus. Her disease course has been worsening. There are no hypoglycemic associated symptoms. Pertinent negatives for hypoglycemia include no confusion, headaches, pallor or seizures. Associated symptoms include fatigue. Pertinent negatives for diabetes include no chest pain, no polydipsia, no polyphagia and  no polyuria. There are no hypoglycemic complications. Symptoms are improving. Diabetic complications include heart disease and peripheral neuropathy. Risk factors for coronary artery disease include diabetes mellitus, dyslipidemia, hypertension, tobacco exposure, sedentary lifestyle and post-menopausal. Current diabetic treatment includes insulin injections. Her weight is increasing steadily. She is following a generally unhealthy diet. When asked about meal planning, she reported none. She has not had a previous visit with a dietitian. She rarely participates in exercise. Her home blood glucose trend is decreasing steadily. Her breakfast blood glucose range is generally 140-180 mg/dl. Her overall blood glucose range is 140-180 mg/dl. (She presents with controlled fasting glycemic profile and point-of-care A1c of 8%, improving from 9%.     ) An ACE inhibitor/angiotensin II receptor blocker is not being taken. Eye exam is current.  Hyperlipidemia This is a chronic problem. The current episode started more than 1 year ago. The problem is controlled. Exacerbating diseases include diabetes. Pertinent negatives include no chest pain, myalgias or shortness of breath. Current antihyperlipidemic treatment includes statins. Risk factors for coronary artery disease include dyslipidemia, diabetes mellitus, hypertension, post-menopausal and a  sedentary lifestyle.  Hypertension This is a chronic problem. The current episode started more than 1 year ago. Pertinent negatives include no chest pain, headaches, palpitations or shortness of breath. Risk factors for coronary artery disease include dyslipidemia, diabetes mellitus, sedentary lifestyle and smoking/tobacco exposure. Past treatments include calcium channel blockers.    Review of systems  Constitutional: + Gained 6 pounds since last visit,   current  Body mass index is 19.23 kg/m. , no fatigue, no subjective hyperthermia, no subjective hypothermia Eyes: no blurry vision, no xerophthalmia ENT: no sore throat, no nodules palpated in throat, no dysphagia/odynophagia, no hoarseness Cardiovascular: no Chest Pain, no Shortness of Breath, no palpitations, no leg swelling Respiratory: no cough, no shortness of breath Gastrointestinal: no Nausea/Vomiting/Diarhhea Musculoskeletal: Complains of diffuse body pain including back pain related to her previous extensive surgery. Skin: no rashes, no hyperemia Neurological: no tremors, no numbness, no tingling, no dizziness Psychiatric: no depression, no anxiety    Objective:    BP (!) 149/79   Pulse 77   Ht _0  (1.549 m)   Wt 101 lb 12.8 oz (46.2 kg)   BMI 19.23 kg/m   Wt Readings from Last 3 Encounters:  03/06/20 101 lb 12.8 oz (46.2 kg)  02/25/20 101 lb (45.8 kg)  11/02/19 96 lb (43.5 kg)       CMP ( most recent) CMP     Component Value Date/Time   NA 140 02/25/2020 0953   K 4.3 02/25/2020 0953   CL 101 02/25/2020 0953   CO2 23 02/25/2020 0953   GLUCOSE 123 (H) 02/25/2020 0953   GLUCOSE 203 (H) 02/24/2019 0209   BUN 13 02/25/2020 0953   CREATININE 0.75 02/25/2020 0953   CALCIUM 9.6 02/25/2020 0953   PROT 6.5 02/25/2020 0953   ALBUMIN 4.2 02/25/2020 0953   AST 14 02/25/2020 0953   ALT 10 02/25/2020 0953   ALKPHOS 128 (H) 02/25/2020 0953   BILITOT 0.4 02/25/2020 0953   GFRNONAA 83 02/25/2020 0953   GFRAA 95  02/25/2020 0953     Diabetic Labs (most recent): Lab Results  Component Value Date   HGBA1C 8.1 (H) 02/25/2020   HGBA1C 9.0 (H) 11/02/2019   HGBA1C 8.3 (H) 07/26/2019     Lipid Panel ( most recent) Lipid Panel     Component Value Date/Time   CHOL 155 04/20/2019 1134   TRIG 97 04/20/2019 1134  TRIG 72 05/14/2013 1251   HDL 63 04/20/2019 1134   HDL 49 05/14/2013 1251   CHOLHDL 2.5 04/20/2019 1134   LDLCALC 73 04/20/2019 1134   LDLCALC 96 05/14/2013 1251   LABVLDL 19 04/20/2019 1134      Lab Results  Component Value Date   TSH 0.896 07/26/2019   TSH 0.787 12/14/2018   TSH 0.651 01/29/2018   TSH 0.476 02/13/2015      Assessment & Plan:   1. Diabetes mellitus with complication ([place])  - TASHERA MONTALVO has currently uncontrolled symptomatic type 2 DM since  69 years of age. She presents with controlled fasting glycemic profile and point-of-care A1c of 8%, improving from 9%.   She has no major hypoglycemia.  Her average blood glucose is 161 for the last 30 days.   Recent labs reviewed. She reports fluctuating glycemic profile including some hypoglycemic episodes. - I had a long discussion with her about the progressive nature of diabetes and the pathology behind its complications. -her diabetes is complicated by coronary artery disease, chronic active smoking, and she remains at a high risk for more acute and chronic complications which include CAD, CVA, CKD, retinopathy, and neuropathy. These are all discussed in detail with her.  - I have counseled her on diet  by adopting a carbohydrate restricted/protein rich diet. Patient is encouraged to switch to  unprocessed or minimally processed     complex starch and increased protein intake (animal or plant source), fruits, and vegetables. -  she is advised to stick to a routine mealtimes to eat 3 meals  a day and avoid unnecessary snacks ( to snack only to correct hypoglycemia).   - she  admits there is a room for improvement  in her diet and drink choices. -  Suggestion is made for her to avoid simple carbohydrates  from her diet including Cakes, Sweet Desserts / Pastries, Ice Cream, Soda (diet and regular), Sweet Tea, Candies, Chips, Cookies, Sweet Pastries,  Store Bought Juices, Alcohol in Excess of  1-2 drinks a day, Artificial Sweeteners, Coffee Creamer, and "Sugar-free" Products. This will help patient to have stable blood glucose profile and potentially avoid unintended weight gain.  - she will be scheduled with Jearld Fenton, RDN, CDE for diabetes education.  - I have approached her with the following individualized plan to manage  her diabetes and patient agrees:   - she still believes that she did have a better control when she was taking Novolin 70/30.  However, her current regimen of Lantus is working for her.    -She is advised to continue Lantus 25 units daily at breakfast, associated with monitoring blood glucose twice a day-daily before breakfast and at bedtime.   - she is encouraged to call clinic for fasting blood glucose levels less than 70 or above 200 mg /dl. - she is not a candidate for metformin, SGLT2 inhibitors, nor incretin therapy due to her body habitus.   - Specific targets for  A1c;  LDL, HDL, Triglycerides, and  Waist Circumference were discussed with the patient.  2) Blood Pressure /Hypertension:    Her blood pressure is near target.    she is advised to continue her current medications including losartan 100 mg p.o. daily with breakfast as well as amlodipine 5 mg p.o. daily at breakfast.  3) Lipids/Hyperlipidemia:   Review of her recent lipid panel showed  controlled  LDL at 73 .  she  is advised to continue pravastatin 20 mg p.o. daily  at bedtime.    - Side effects and precautions discussed with her.  4)  Weight/Diet:  She has gained 6 pounds since last visit-a good development for her.  She is not a candidate for weight loss.   Exercise, and detailed carbohydrates information  provided  -  detailed on discharge instructions.  5) Chronic Care/Health Maintenance:  -she  is on ACEI/ARB and Statin medications and  is encouraged to initiate and continue to follow up with Ophthalmology, Dentist,  Podiatrist at least yearly or according to recommendations, and advised to  quit stay away from smoking. I have recommended yearly flu vaccine and pneumonia vaccine at least every 5 years; and  sleep for at least 7 hours a day. -She is extensively counseled for smoking cessation.  - she is  advised to maintain close follow up with Sharion Balloon, FNP for primary care needs, as well as her other providers for optimal and coordinated care.  - Time spent on this patient care encounter:  35 min, of which > 50% was spent in  counseling and the rest reviewing her blood glucose logs , discussing her hypoglycemia and hyperglycemia episodes, reviewing her current and  previous labs / studies  ( including abstraction from other facilities) and medications  doses and developing a  long term treatment plan and documenting her care.   Please refer to Patient Instructions for Blood Glucose Monitoring and Insulin/Medications Dosing Guide"  in media tab for additional information. Please  also refer to " Patient Self Inventory" in the Media  tab for reviewed elements of pertinent patient history.  Paulita Fujita participated in the discussions, expressed understanding, and voiced agreement with the above plans.  All questions were answered to her satisfaction. she is encouraged to contact clinic should she have any questions or concerns prior to her return visit.   Follow up plan: - Return in about 6 months (around 09/06/2020) for NV Office Urine MA, NV A1c in Office.  Glade Lloyd, MD [facility] [address] [place], [place] [address] Phone: [phone]  Fax: [phone]    03/06/2020, 12:52 PM  This note was partially dictated with  voice recognition software. Similar sounding words can be transcribed inadequately or may not  be corrected upon review.

## 2020-03-06 NOTE — Patient Instructions (Signed)

## 2020-03-12 IMAGING — RF LUMBAR SPINE - 2-3 VIEW
1 series · 2 of 2 positions shown · non-contrast
Comparison: Earlier same day

CLINICAL DATA: T10-L2 posterior fusion. L1-2 TLIF. Cement
augmentation T10 through L1.

EXAM:
DG C-ARM 61-120 MIN; LUMBAR SPINE - 2-3 VIEW

[Series 1: run · 2 of 2 slices shown]
[im 1/2]
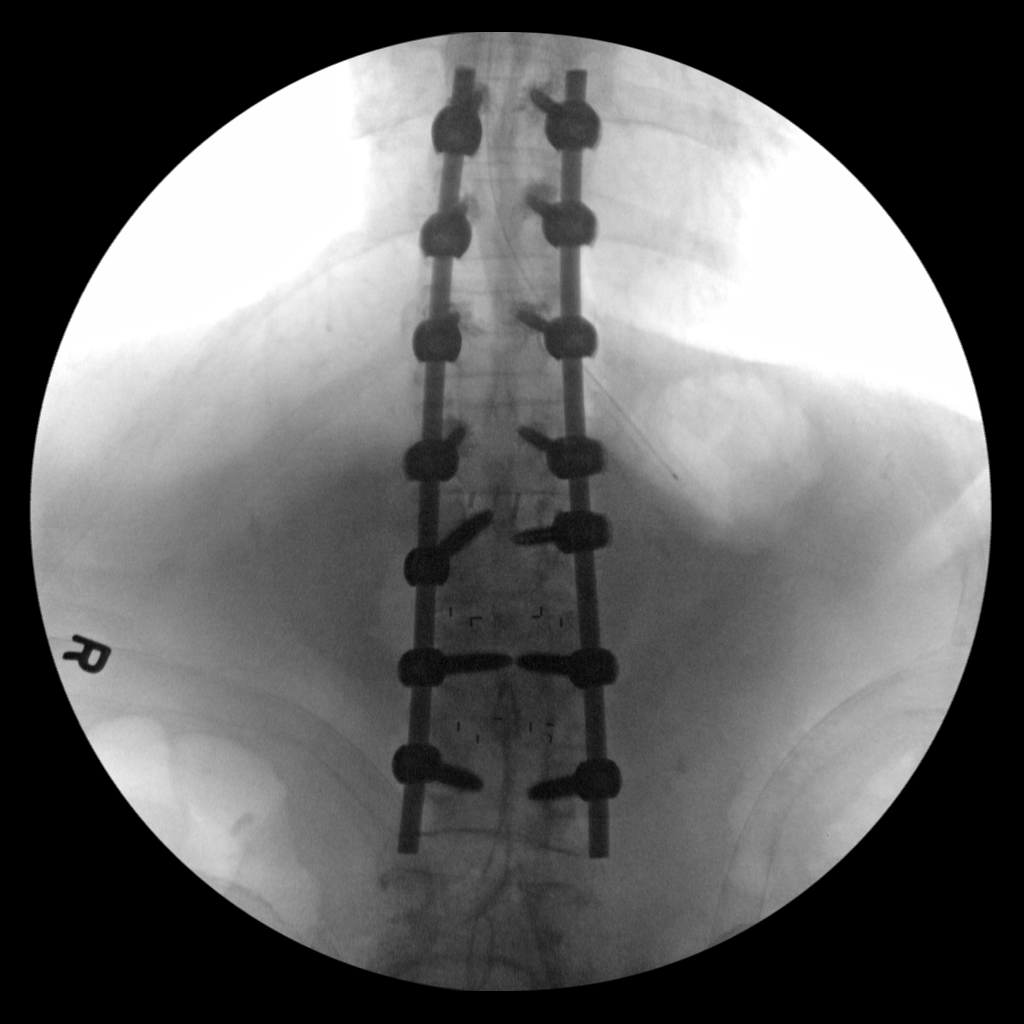
[im 2/2]
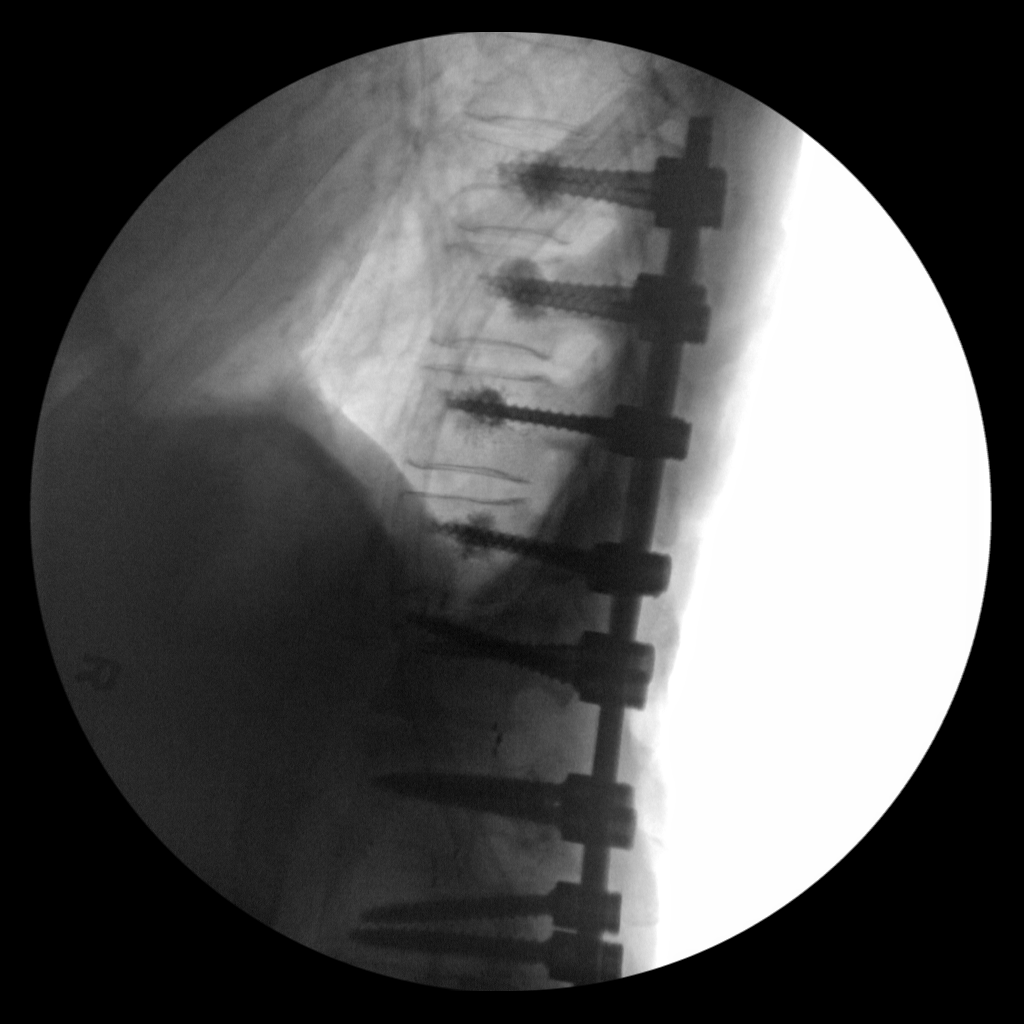

[2 of 2 positions shown; findings below may reference images not displayed]

FINDINGS: Previous discectomy and fusion from L2 through L4. Extension of the
fusion to T10, with pedicle screws and posterior rods. Vertebral
augmentation cement evident from T10 through L1. New interbody
fusion spacer at L1-2.
IMPRESSION: Good appearance following extension of the spinal fusion up to T10,
with features as above.

## 2020-03-12 IMAGING — CR LUMBAR SPINE - 2-3 VIEW
2 series · 2 of 2 positions shown · non-contrast
Comparison: CT 02/23/2019

CLINICAL DATA: Lumbar fusion

EXAM:
LUMBAR SPINE - 2-3 VIEW

[lateral (1 of 2)]
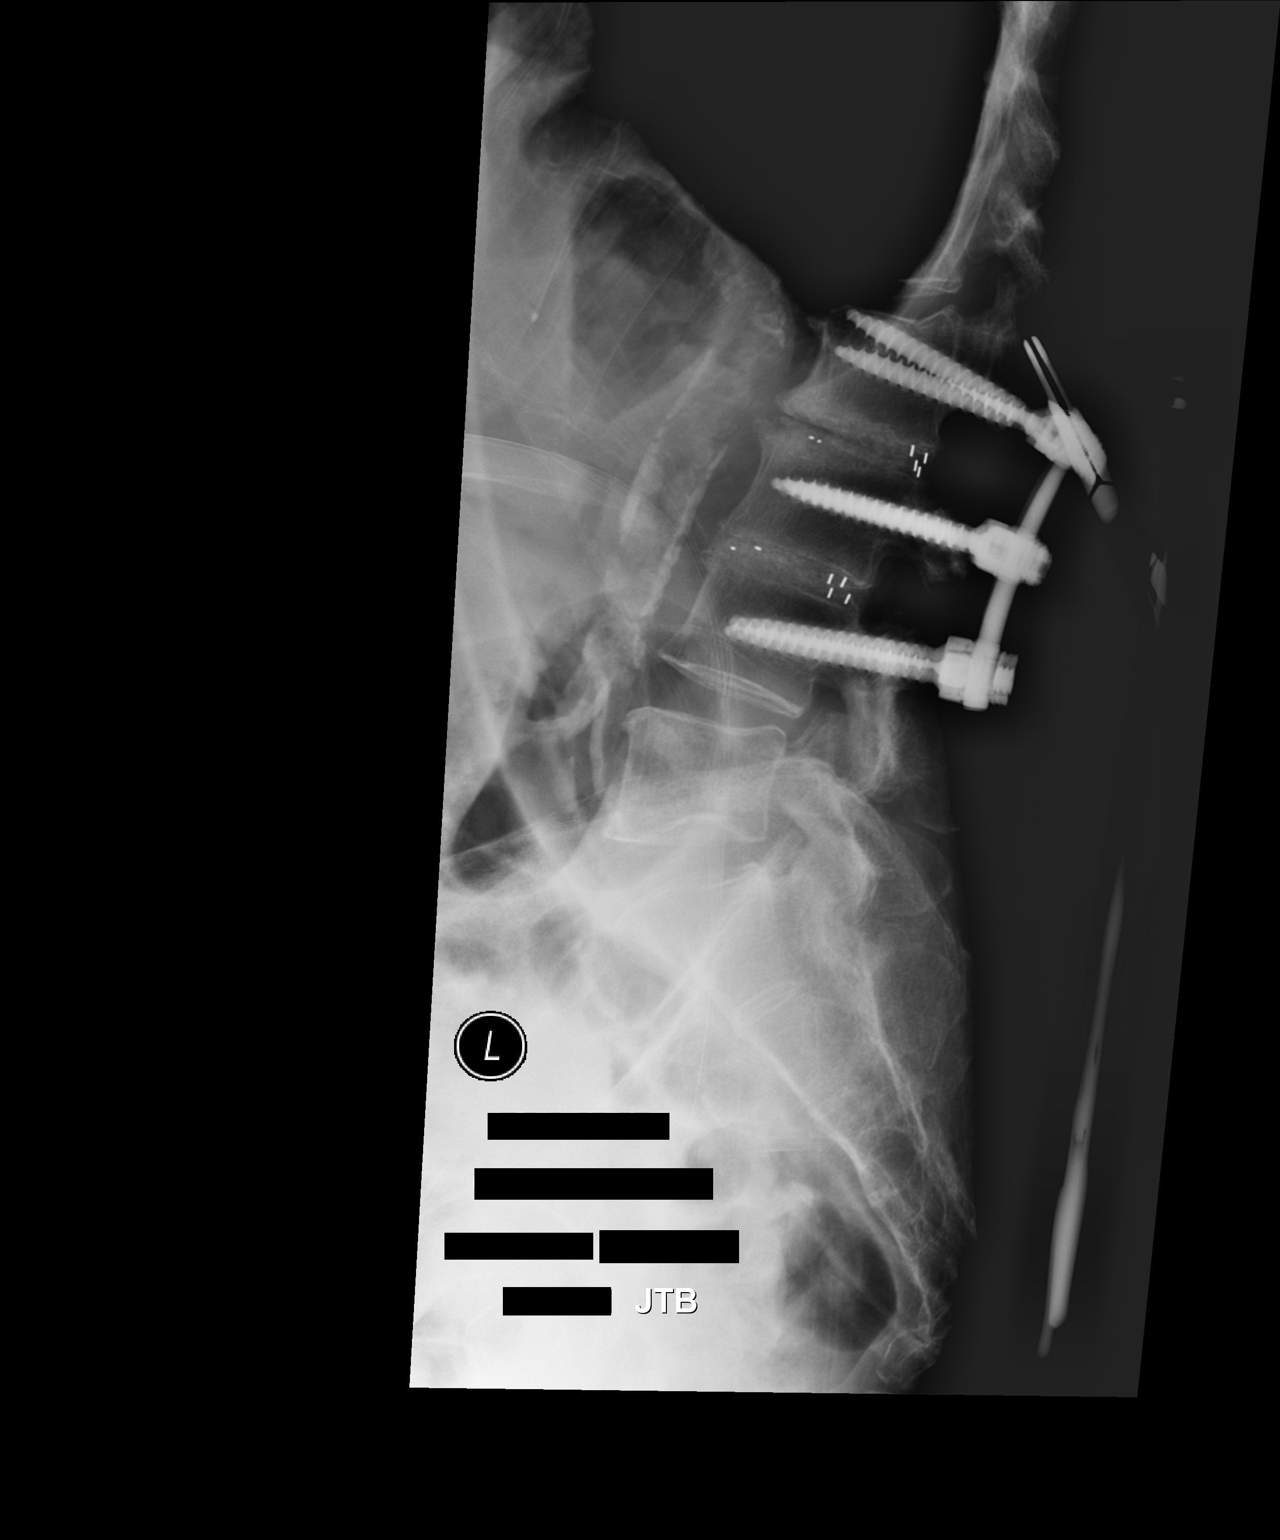

[lateral (2 of 2)]
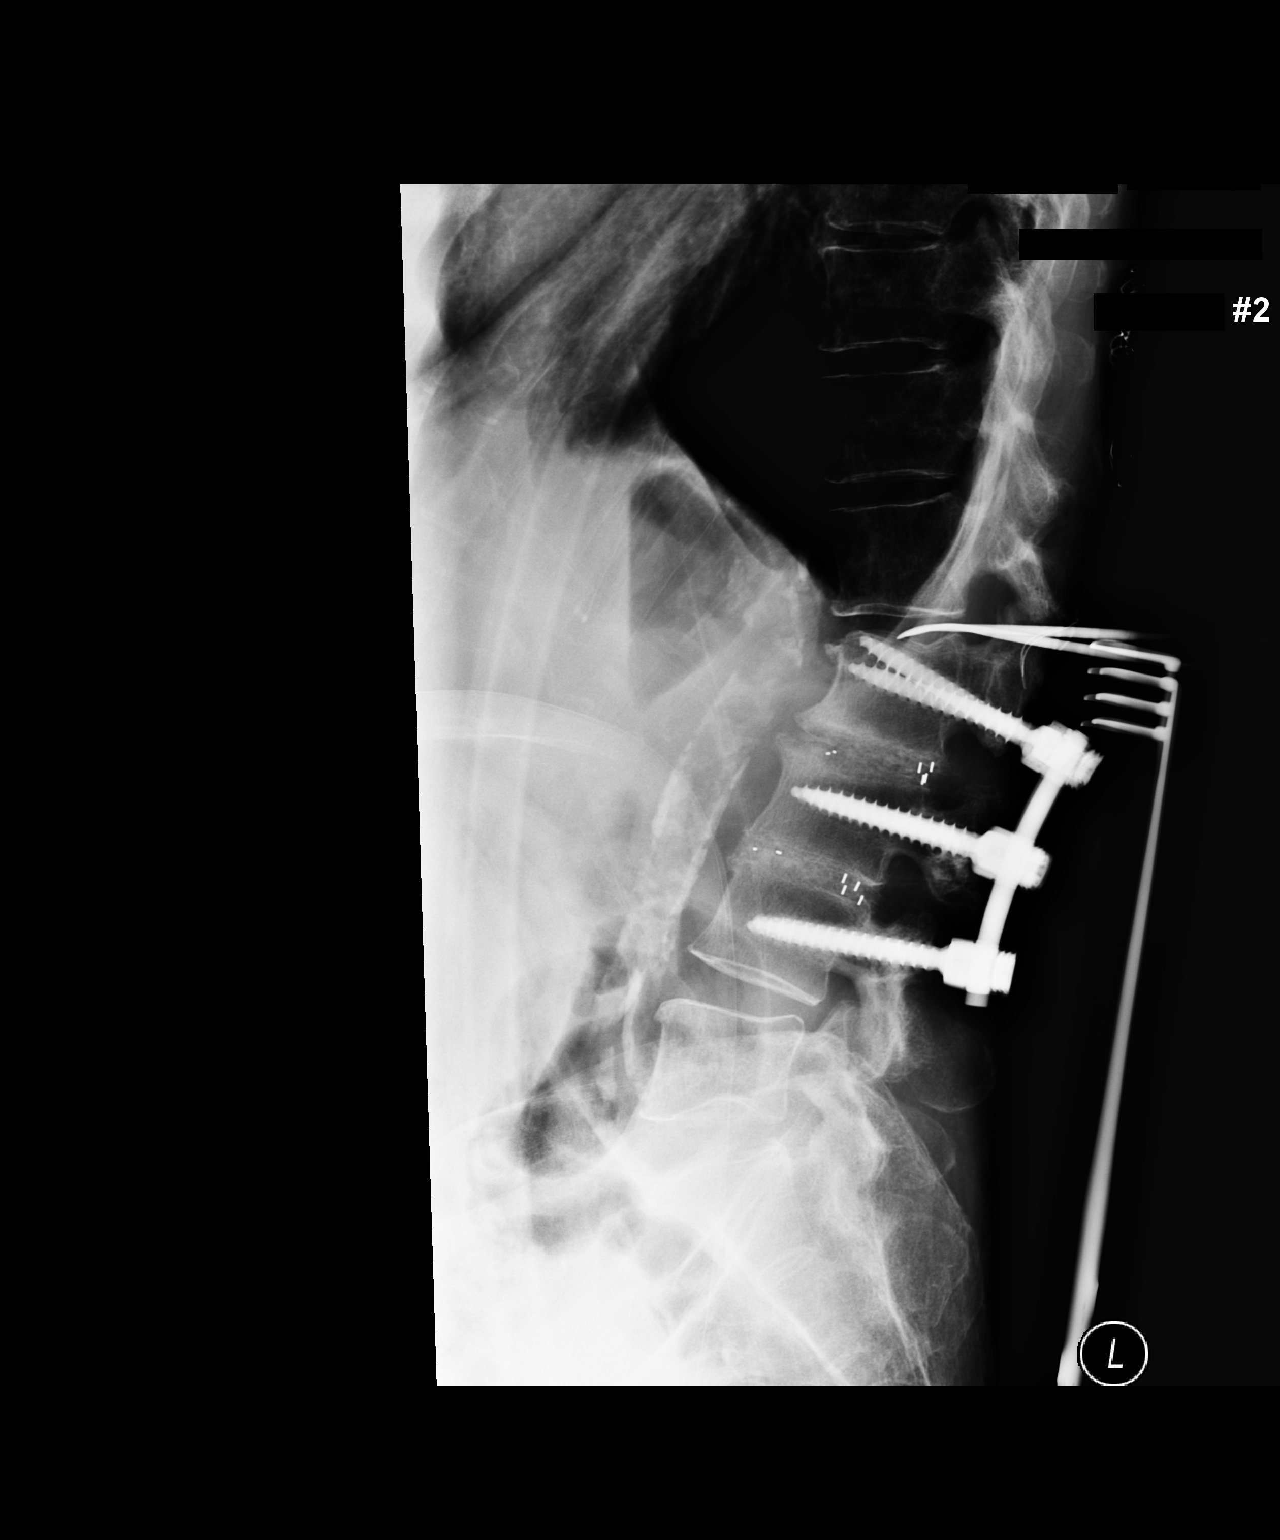

[2 of 2 positions shown; findings below may reference images not displayed]

FINDINGS: Initial lateral intraoperative view of the lumbar spine demonstrates
posterior fusion rods and pedicle screws at L2, L3, L4. Second image
demonstrates a probe at the L1-L2 disc space.
IMPRESSION: Intraoperative views as above.

## 2020-06-13 ENCOUNTER — Other Ambulatory Visit: Payer: Self-pay

## 2020-06-13 ENCOUNTER — Encounter: Payer: Self-pay | Admitting: Family

## 2020-06-13 ENCOUNTER — Ambulatory Visit (INDEPENDENT_AMBULATORY_CARE_PROVIDER_SITE_OTHER): Payer: Medicare HMO | Admitting: Family

## 2020-06-13 VITALS — BP 141/74 | HR 78 | Temp 97.7°F | Ht 61.0 in | Wt 100.4 lb

## 2020-06-13 DIAGNOSIS — H9193 Unspecified hearing loss, bilateral: Secondary | ICD-10-CM | POA: Diagnosis not present

## 2020-06-13 DIAGNOSIS — E1169 Type 2 diabetes mellitus with other specified complication: Secondary | ICD-10-CM

## 2020-06-13 DIAGNOSIS — I2510 Atherosclerotic heart disease of native coronary artery without angina pectoris: Secondary | ICD-10-CM | POA: Diagnosis not present

## 2020-06-13 DIAGNOSIS — E559 Vitamin D deficiency, unspecified: Secondary | ICD-10-CM

## 2020-06-13 DIAGNOSIS — I10 Essential (primary) hypertension: Secondary | ICD-10-CM

## 2020-06-13 DIAGNOSIS — I2584 Coronary atherosclerosis due to calcified coronary lesion: Secondary | ICD-10-CM | POA: Diagnosis not present

## 2020-06-13 DIAGNOSIS — K219 Gastro-esophageal reflux disease without esophagitis: Secondary | ICD-10-CM | POA: Diagnosis not present

## 2020-06-13 DIAGNOSIS — E785 Hyperlipidemia, unspecified: Secondary | ICD-10-CM | POA: Diagnosis not present

## 2020-06-13 DIAGNOSIS — E0865 Diabetes mellitus due to underlying condition with hyperglycemia: Secondary | ICD-10-CM

## 2020-06-13 DIAGNOSIS — E1159 Type 2 diabetes mellitus with other circulatory complications: Secondary | ICD-10-CM

## 2020-06-13 DIAGNOSIS — M5416 Radiculopathy, lumbar region: Secondary | ICD-10-CM

## 2020-06-13 DIAGNOSIS — J449 Chronic obstructive pulmonary disease, unspecified: Secondary | ICD-10-CM

## 2020-06-13 DIAGNOSIS — F17200 Nicotine dependence, unspecified, uncomplicated: Secondary | ICD-10-CM

## 2020-06-13 LAB — BAYER DCA HB A1C WAIVED: HB A1C (BAYER DCA - WAIVED): 7.5 % — ABNORMAL HIGH (ref <7.0)

## 2020-06-13 MED ORDER — SPIRIVA HANDIHALER 18 MCG IN CAPS: 18.0000 ug | ORAL_CAPSULE | Freq: Every day | RESPIRATORY_TRACT | 12 refills | Status: DC

## 2020-06-13 NOTE — Patient Instructions (Signed)

## 2020-06-13 NOTE — Progress Notes (Signed)
Subjective:    Patient ID: Jamie [name], female    DOB: [birth year], 69 y.o.   MRN: [id number]  Chief Complaint  Patient presents with  . Medical Management of Chronic Issues  . Diabetes  . COPD    Diabetes She presents for her follow-up diabetic visit. She has type 2 diabetes mellitus. Her disease course has been worsening. Hypoglycemia symptoms include nervousness/anxiousness. Associated symptoms include foot paresthesias and visual change. Pertinent negatives for diabetes include no blurred vision. There are no hypoglycemic complications. Symptoms are stable. Diabetic complications include heart disease, nephropathy and peripheral neuropathy. Risk factors for coronary artery disease include dyslipidemia, diabetes mellitus, hypertension and sedentary lifestyle. Her bedtime blood glucose range is generally 130-140 mg/dl.  COPD She complains of cough, hoarse voice, shortness of breath and wheezing. This is a chronic problem. The current episode started more than 1 year ago. The problem has been waxing and waning. Associated symptoms include heartburn and malaise/fatigue. Her symptoms are alleviated by rest. She reports moderate improvement on treatment. Her past medical history is significant for COPD.  Hypertension This is a chronic problem. The current episode started more than 1 year ago. The problem has been waxing and waning since onset. The problem is controlled. Associated symptoms include malaise/fatigue and shortness of breath. Pertinent negatives include no blurred vision or peripheral edema. Risk factors for coronary artery disease include sedentary lifestyle, smoking/tobacco exposure, dyslipidemia and diabetes mellitus. The current treatment provides moderate improvement.  Hyperlipidemia This is a chronic problem. The current episode started more than 1 year ago. The problem is controlled. Recent lipid tests were reviewed and are normal. Associated symptoms include shortness of  breath. Current antihyperlipidemic treatment includes statins. The current treatment provides moderate improvement of lipids. Risk factors for coronary artery disease include dyslipidemia, diabetes mellitus, hypertension, a sedentary lifestyle and post-menopausal.  Gastroesophageal Reflux She complains of coughing, heartburn, a hoarse voice and wheezing. This is a chronic problem. The current episode started more than 1 year ago. The problem occurs occasionally. The problem has been waxing and waning. Risk factors include smoking/tobacco exposure. She has tried a PPI for the symptoms. The treatment provided moderate relief.      Review of Systems  Constitutional: Positive for malaise/fatigue.  HENT: Positive for hoarse voice.   Eyes: Negative for blurred vision.  Respiratory: Positive for cough, shortness of breath and wheezing.   Gastrointestinal: Positive for heartburn.  Psychiatric/Behavioral: The patient is nervous/anxious.   All other systems reviewed and are negative.      Objective:   Physical Exam Vitals reviewed.  Constitutional:      General: She is not in acute distress.    Appearance: She is well-developed.  HENT:     Head: Normocephalic and atraumatic.     Right Ear: Tympanic membrane normal.     Left Ear: Tympanic membrane normal.     Ears:     Comments: HOH  Eyes:     Pupils: Pupils are equal, round, and reactive to light.  Neck:     Thyroid: No thyromegaly.  Cardiovascular:     Rate and Rhythm: Normal rate and regular rhythm.     Heart sounds: Normal heart sounds. No murmur heard.   Pulmonary:     Effort: Pulmonary effort is normal. No respiratory distress.     Breath sounds: Wheezing present.  Abdominal:     General: Bowel sounds are normal. There is no distension.     Palpations: Abdomen is soft.  Tenderness: There is no abdominal tenderness.  Musculoskeletal:        General: No tenderness. Normal range of motion.     Cervical back: Normal range of  motion and neck supple.  Skin:    General: Skin is warm and dry.  Neurological:     Mental Status: She is alert and oriented to person, place, and time.     Cranial Nerves: No cranial nerve deficit.     Deep Tendon Reflexes: Reflexes are normal and symmetric.  Psychiatric:        Behavior: Behavior normal.        Thought Content: Thought content normal.        Judgment: Judgment normal.       BP (!) 141/74   Pulse 78   Temp 97.7 F (36.5 C) (Temporal)   Ht _0  (1.549 m)   Wt 100 lb 6.4 oz (45.5 kg)   SpO2 94%   BMI 18.97 kg/m      Assessment & Plan:  ANNALYNN CENTANNI comes in today with chief complaint of Medical Management of Chronic Issues, Diabetes, and COPD   Diagnosis and orders addressed:  1. Hyperlipidemia associated with type 2 diabetes mellitus (HCC) - Bayer DCA Hb A1c Waived - CMP14+EGFR - Lipid panel  2. Coronary artery calcification - CMP14+EGFR - Lipid panel  3. DM type 2 causing vascular disease (HCC) - CMP14+EGFR - Microalbumin / creatinine urine ratio  4. Essential hypertension, benign - CMP14+EGFR  5. Moderate COPD (chronic obstructive pulmonary disease) (HCC) - CMP14+EGFR - tiotropium (SPIRIVA HANDIHALER) 18 MCG inhalation capsule; Place 1 capsule (18 mcg total) into inhaler and inhale daily.  Dispense: 30 capsule; Refill: 12  6. Gastroesophageal reflux disease without esophagitis - CMP14+EGFR  7. Diabetes mellitus due to underlying condition, uncontrolled, with hyperglycemia, without long-term current use of insulin (HCC) - CMP14+EGFR  8. Bilateral hearing loss, unspecified hearing loss type - CMP14+EGFR  9. Lumbar radiculopathy, chronic - CMP14+EGFR  10. Current smoker - CMP14+EGFR  11. Vitamin D deficiency - CMP14+EGFR   Labs pending Health Maintenance reviewed Diet and exercise encouraged  Follow up plan: 3 months    Evelina Dun, FNP

## 2020-06-14 LAB — CMP14+EGFR
ALT: 11 IU/L (ref 0–32)
AST: 13 IU/L (ref 0–40)
Albumin/Globulin Ratio: 1.6 (ref 1.2–2.2)
Albumin: 3.9 g/dL (ref 3.8–4.8)
Alkaline Phosphatase: 97 IU/L (ref 48–121)
BUN/Creatinine Ratio: 13 (ref 12–28)
BUN: 14 mg/dL (ref 8–27)
Bilirubin Total: 0.2 mg/dL (ref 0.0–1.2)
CO2: 25 mmol/L (ref 20–29)
Calcium: 9.2 mg/dL (ref 8.7–10.3)
Chloride: 101 mmol/L (ref 96–106)
Creatinine, Ser: 1.11 mg/dL — ABNORMAL HIGH (ref 0.57–1.00)
GFR calc Af Amer: 59 mL/min/{1.73_m2} — ABNORMAL LOW (ref >59)
GFR calc non Af Amer: 52 mL/min/{1.73_m2} — ABNORMAL LOW (ref >59)
Globulin, Total: 2.5 g/dL (ref 1.5–4.5)
Glucose: 246 mg/dL — ABNORMAL HIGH (ref 65–99)
Potassium: 5 mmol/L (ref 3.5–5.2)
Sodium: 136 mmol/L (ref 134–144)
Total Protein: 6.4 g/dL (ref 6.0–8.5)

## 2020-06-14 LAB — LIPID PANEL
Chol/HDL Ratio: 2.9 ratio (ref 0.0–4.4)
Cholesterol, Total: 140 mg/dL (ref 100–199)
HDL: 48 mg/dL (ref >39)
LDL Chol Calc (NIH): 74 mg/dL (ref 0–99)
Triglycerides: 94 mg/dL (ref 0–149)
VLDL Cholesterol Cal: 18 mg/dL (ref 5–40)

## 2020-06-15 ENCOUNTER — Other Ambulatory Visit: Payer: Self-pay | Admitting: Family

## 2020-06-28 ENCOUNTER — Other Ambulatory Visit: Payer: Self-pay

## 2020-06-28 LAB — HM DIABETES EYE EXAM

## 2020-07-11 NOTE — Chronic Care Management (AMB) (Signed)
  Chronic Care Management   Outreach Note  02/03/2020 Name: Jamie [name] MRN: [id number] DOB: June 07, 1951  Referred by: Sharion Balloon, FNP Reason for referral : Chronic Care Management (RN follow up)   An unsuccessful telephone follow-up was attempted today. The patient was referred to the case management team for assistance with care management and care coordination.   Follow Up Plan: The care management team will reach out to the patient again over the next 45 days.   Chong Sicilian, BSN, RN-BC Embedded Chronic Care Manager [facility] [place] Management Direct Dial: [phone]

## 2020-07-13 NOTE — Chronic Care Management (AMB) (Signed)
  Chronic Care Management   Outreach Note  08/18/2019 Name: Jamie [name] MRN: [id number] DOB: 1951/03/20  Referred by: Sharion Balloon, FNP Reason for referral : Chronic Care Management   An unsuccessful telephone outreach was attempted today. The patient was referred to the case management team for assistance with care management and care coordination.   Follow Up Plan: The care management team will reach out to the patient again over the next 60 days.   Chong Sicilian, BSN, RN-BC Embedded Chronic Care Manager [facility] [place] Management Direct Dial: [phone]

## 2020-07-28 ENCOUNTER — Ambulatory Visit (INDEPENDENT_AMBULATORY_CARE_PROVIDER_SITE_OTHER): Payer: Medicare HMO

## 2020-07-28 ENCOUNTER — Other Ambulatory Visit: Payer: Self-pay

## 2020-07-28 DIAGNOSIS — Z23 Encounter for immunization: Secondary | ICD-10-CM | POA: Diagnosis not present

## 2020-09-12 ENCOUNTER — Ambulatory Visit: Payer: Medicare HMO | Admitting: "Endocrinology

## 2020-09-15 ENCOUNTER — Other Ambulatory Visit: Payer: Self-pay

## 2020-09-15 ENCOUNTER — Ambulatory Visit (INDEPENDENT_AMBULATORY_CARE_PROVIDER_SITE_OTHER): Payer: Medicare HMO | Admitting: Family

## 2020-09-15 ENCOUNTER — Encounter: Payer: Self-pay | Admitting: Family

## 2020-09-15 VITALS — BP 139/85 | HR 95 | Temp 97.4°F | Ht 61.0 in | Wt 96.6 lb

## 2020-09-15 DIAGNOSIS — E0865 Diabetes mellitus due to underlying condition with hyperglycemia: Secondary | ICD-10-CM | POA: Diagnosis not present

## 2020-09-15 DIAGNOSIS — I2510 Atherosclerotic heart disease of native coronary artery without angina pectoris: Secondary | ICD-10-CM | POA: Diagnosis not present

## 2020-09-15 DIAGNOSIS — F17200 Nicotine dependence, unspecified, uncomplicated: Secondary | ICD-10-CM

## 2020-09-15 DIAGNOSIS — G8929 Other chronic pain: Secondary | ICD-10-CM

## 2020-09-15 DIAGNOSIS — E785 Hyperlipidemia, unspecified: Secondary | ICD-10-CM

## 2020-09-15 DIAGNOSIS — E1159 Type 2 diabetes mellitus with other circulatory complications: Secondary | ICD-10-CM

## 2020-09-15 DIAGNOSIS — J449 Chronic obstructive pulmonary disease, unspecified: Secondary | ICD-10-CM | POA: Diagnosis not present

## 2020-09-15 DIAGNOSIS — Z716 Tobacco abuse counseling: Secondary | ICD-10-CM

## 2020-09-15 DIAGNOSIS — I10 Essential (primary) hypertension: Secondary | ICD-10-CM | POA: Diagnosis not present

## 2020-09-15 DIAGNOSIS — I2584 Coronary atherosclerosis due to calcified coronary lesion: Secondary | ICD-10-CM

## 2020-09-15 DIAGNOSIS — K219 Gastro-esophageal reflux disease without esophagitis: Secondary | ICD-10-CM | POA: Diagnosis not present

## 2020-09-15 DIAGNOSIS — M5416 Radiculopathy, lumbar region: Secondary | ICD-10-CM

## 2020-09-15 DIAGNOSIS — E1169 Type 2 diabetes mellitus with other specified complication: Secondary | ICD-10-CM | POA: Diagnosis not present

## 2020-09-15 DIAGNOSIS — H9193 Unspecified hearing loss, bilateral: Secondary | ICD-10-CM | POA: Diagnosis not present

## 2020-09-15 DIAGNOSIS — M5441 Lumbago with sciatica, right side: Secondary | ICD-10-CM

## 2020-09-15 DIAGNOSIS — R636 Underweight: Secondary | ICD-10-CM

## 2020-09-15 DIAGNOSIS — E782 Mixed hyperlipidemia: Secondary | ICD-10-CM

## 2020-09-15 LAB — BAYER DCA HB A1C WAIVED: HB A1C (BAYER DCA - WAIVED): 7.7 % — ABNORMAL HIGH (ref <7.0)

## 2020-09-15 MED ORDER — GABAPENTIN 100 MG PO CAPS: 100.0000 mg | ORAL_CAPSULE | Freq: Two times a day (BID) | ORAL | 1 refills | Status: DC

## 2020-09-15 MED ORDER — LOSARTAN POTASSIUM 100 MG PO TABS: 100.0000 mg | ORAL_TABLET | Freq: Every day | ORAL | 1 refills | Status: DC

## 2020-09-15 MED ORDER — ASPIRIN EC 81 MG PO TBEC: 81.0000 mg | DELAYED_RELEASE_TABLET | Freq: Every day | ORAL | 1 refills | Status: DC

## 2020-09-15 MED ORDER — SPIRIVA HANDIHALER 18 MCG IN CAPS: 18.0000 ug | ORAL_CAPSULE | Freq: Every day | RESPIRATORY_TRACT | 12 refills | Status: DC

## 2020-09-15 MED ORDER — AMLODIPINE BESYLATE 5 MG PO TABS: 5.0000 mg | ORAL_TABLET | Freq: Every day | ORAL | 1 refills | Status: DC

## 2020-09-15 MED ORDER — PRAVASTATIN SODIUM 20 MG PO TABS: 20.0000 mg | ORAL_TABLET | Freq: Every day | ORAL | 1 refills | Status: DC

## 2020-09-15 NOTE — Patient Instructions (Signed)

## 2020-09-15 NOTE — Progress Notes (Signed)
Subjective:    Patient ID: Jamie [name], female    DOB: Feb 11, 1951, 69 y.o.   MRN: [id number]  Chief Complaint  Patient presents with  . Medical Management of Chronic Issues   PT presents to the office today for chronic follow up.PT had surgical decompression and stabilization from L2-L4 on 02/05/17. Pt states her back continues to hurtand her pain is a8out 10 when she walks or standing.  Her A1C is improved today. She is followed by Endocrinologists.  Back Pain This is a chronic problem. The current episode started more than 1 year ago. The problem occurs intermittently. The problem has been waxing and waning since onset. The pain is present in the lumbar spine. The pain radiates to the right thigh and left thigh. The pain is at a severity of 10/10. The pain is moderate.  Diabetes She presents for her follow-up diabetic visit. She has type 2 diabetes mellitus. Her disease course has been stable. Pertinent negatives for diabetes include no blurred vision. Symptoms are stable. Pertinent negatives for diabetic complications include no nephropathy or peripheral neuropathy. Risk factors for coronary artery disease include dyslipidemia, hypertension, sedentary lifestyle and post-menopausal. She is following a generally healthy diet. Her overall blood glucose range is 110-130 mg/dl. An ACE inhibitor/angiotensin II receptor blocker is being taken.  Hypertension This is a chronic problem. The current episode started more than 1 year ago. The problem has been resolved since onset. The problem is controlled. Associated symptoms include malaise/fatigue and shortness of breath. Pertinent negatives include no blurred vision or peripheral edema. Risk factors for coronary artery disease include dyslipidemia, diabetes mellitus, sedentary lifestyle and smoking/tobacco exposure. The current treatment provides moderate improvement.  Gastroesophageal Reflux She complains of belching and heartburn. This is a  chronic problem. The current episode started more than 1 year ago. The problem occurs occasionally. She has tried a PPI for the symptoms. The treatment provided moderate relief.  Hyperlipidemia This is a chronic problem. The current episode started more than 1 year ago. Associated symptoms include shortness of breath. Current antihyperlipidemic treatment includes statins. The current treatment provides moderate improvement of lipids. Risk factors for coronary artery disease include dyslipidemia, hypertension, a sedentary lifestyle and post-menopausal.  Nicotine Dependence Presents for follow-up visit. Her urge triggers include company of smokers. She smokes 1 pack of cigarettes per day.  COPD States her breathing is stable. Using Spiriva daily.    Review of Systems  Constitutional: Positive for malaise/fatigue.  Eyes: Negative for blurred vision.  Respiratory: Positive for shortness of breath.   Gastrointestinal: Positive for heartburn.  Musculoskeletal: Positive for back pain.  All other systems reviewed and are negative.      Objective:   Physical Exam Vitals reviewed.  Constitutional:      General: She is not in acute distress.    Appearance: She is well-developed.     Comments: underweight  HENT:     Head: Normocephalic and atraumatic.     Right Ear: Tympanic membrane normal.     Left Ear: Tympanic membrane normal.     Ears:     Comments: HOH Eyes:     Pupils: Pupils are equal, round, and reactive to light.  Neck:     Thyroid: No thyromegaly.  Cardiovascular:     Rate and Rhythm: Normal rate and regular rhythm.     Heart sounds: Normal heart sounds. No murmur heard.   Pulmonary:     Effort: Pulmonary effort is normal. No respiratory distress.  Breath sounds: Wheezing present.  Abdominal:     General: Bowel sounds are normal. There is no distension.     Palpations: Abdomen is soft.     Tenderness: There is no abdominal tenderness.  Musculoskeletal:         General: No tenderness. Normal range of motion.     Cervical back: Normal range of motion and neck supple.  Skin:    General: Skin is warm and dry.  Neurological:     Mental Status: She is alert and oriented to person, place, and time.     Cranial Nerves: No cranial nerve deficit.     Deep Tendon Reflexes: Reflexes are normal and symmetric.  Psychiatric:        Behavior: Behavior normal.        Thought Content: Thought content normal.        Judgment: Judgment normal.       BP 139/85   Pulse 95   Temp (!) 97.4 F (36.3 C) (Temporal)   Ht '5\' 1"'  (1.549 m)   Wt 96 lb 9.6 oz (43.8 kg)   SpO2 94%   BMI 18.25 kg/m      Assessment & Plan:  SWEDEN LESURE comes in today with chief complaint of Medical Management of Chronic Issues   Diagnosis and orders addressed:  1. DM type 2 causing vascular disease ([place]) - Bayer DCA Hb A1c Waived - Microalbumin / creatinine urine ratio - CMP14+EGFR - CBC with Differential/Platelet  2. Coronary artery calcification - CMP14+EGFR - CBC with Differential/Platelet  3. Essential hypertension, benign - CMP14+EGFR - CBC with Differential/Platelet  4. Moderate COPD (chronic obstructive pulmonary disease) (HCC) - CMP14+EGFR - CBC with Differential/Platelet - tiotropium (SPIRIVA HANDIHALER) 18 MCG inhalation capsule; Place 1 capsule (18 mcg total) into inhaler and inhale daily.  Dispense: 30 capsule; Refill: 12  5. Gastroesophageal reflux disease without esophagitis - CMP14+EGFR - CBC with Differential/Platelet  6. Diabetes mellitus due to underlying condition, uncontrolled, with hyperglycemia, without long-term current use of insulin (HCC) - CMP14+EGFR - CBC with Differential/Platelet  7. Hyperlipidemia associated with type 2 diabetes mellitus (HCC) - CMP14+EGFR - CBC with Differential/Platelet - pravastatin (PRAVACHOL) 20 MG tablet; Take 1 tablet (20 mg total) by mouth daily.  Dispense: 90 tablet; Refill: 1  8. Bilateral hearing  loss, unspecified hearing loss type - CMP14+EGFR - CBC with Differential/Platelet  9. Lumbar radiculopathy, chronic - CMP14+EGFR - CBC with Differential/Platelet - gabapentin (NEURONTIN) 100 MG capsule; Take 1 capsule (100 mg total) by mouth 2 (two) times daily.  Dispense: 60 capsule; Refill: 1  10. Chronic bilateral low back pain with right-sided sciatica - CMP14+EGFR - CBC with Differential/Platelet  11. Current smoker - CMP14+EGFR - CBC with Differential/Platelet  12. Mixed hyperlipidemia - CMP14+EGFR - CBC with Differential/Platelet  13. Tobacco abuse counseling - CMP14+EGFR - CBC with Differential/Platelet  14. Underweight - CMP14+EGFR - CBC with Differential/Platelet  15. Essential hypertension - losartan (COZAAR) 100 MG tablet; Take 1 tablet (100 mg total) by mouth daily.  Dispense: 90 tablet; Refill: 1 - amLODipine (NORVASC) 5 MG tablet; Take 1 tablet (5 mg total) by mouth daily.  Dispense: 90 tablet; Refill: 1   Labs pending Health Maintenance reviewed Diet and exercise encouraged  Follow up plan: 3  Months    Evelina Dun, FNP

## 2020-09-16 LAB — CMP14+EGFR
ALT: 7 IU/L (ref 0–32)
AST: 12 IU/L (ref 0–40)
Albumin/Globulin Ratio: 1.4 (ref 1.2–2.2)
Albumin: 3.7 g/dL — ABNORMAL LOW (ref 3.8–4.8)
Alkaline Phosphatase: 100 IU/L (ref 44–121)
BUN/Creatinine Ratio: 17 (ref 12–28)
BUN: 14 mg/dL (ref 8–27)
Bilirubin Total: 0.2 mg/dL (ref 0.0–1.2)
CO2: 22 mmol/L (ref 20–29)
Calcium: 8.8 mg/dL (ref 8.7–10.3)
Chloride: 100 mmol/L (ref 96–106)
Creatinine, Ser: 0.83 mg/dL (ref 0.57–1.00)
GFR calc Af Amer: 84 mL/min/{1.73_m2} (ref >59)
GFR calc non Af Amer: 73 mL/min/{1.73_m2} (ref >59)
Globulin, Total: 2.7 g/dL (ref 1.5–4.5)
Glucose: 155 mg/dL — ABNORMAL HIGH (ref 65–99)
Potassium: 4.1 mmol/L (ref 3.5–5.2)
Sodium: 137 mmol/L (ref 134–144)
Total Protein: 6.4 g/dL (ref 6.0–8.5)

## 2020-09-16 LAB — CBC WITH DIFFERENTIAL/PLATELET
Basophils Absolute: 0.1 10*3/uL (ref 0.0–0.2)
Basos: 1 %
EOS (ABSOLUTE): 0.1 10*3/uL (ref 0.0–0.4)
Eos: 1 %
Hematocrit: 36.6 % (ref 34.0–46.6)
Hemoglobin: 11.8 g/dL (ref 11.1–15.9)
Immature Grans (Abs): 0 10*3/uL (ref 0.0–0.1)
Immature Granulocytes: 0 %
Lymphocytes Absolute: 1.3 10*3/uL (ref 0.7–3.1)
Lymphs: 17 %
MCH: 25.7 pg — ABNORMAL LOW (ref 26.6–33.0)
MCHC: 32.2 g/dL (ref 31.5–35.7)
MCV: 80 fL (ref 79–97)
Monocytes Absolute: 0.5 10*3/uL (ref 0.1–0.9)
Monocytes: 7 %
Neutrophils Absolute: 5.6 10*3/uL (ref 1.4–7.0)
Neutrophils: 74 %
Platelets: 288 10*3/uL (ref 150–450)
RBC: 4.59 x10E6/uL (ref 3.77–5.28)
RDW: 13.4 % (ref 11.7–15.4)
WBC: 7.5 10*3/uL (ref 3.4–10.8)

## 2020-09-18 LAB — MICROALBUMIN / CREATININE URINE RATIO
Creatinine, Urine: 110.7 mg/dL
Microalb/Creat Ratio: 9 mg/g creat (ref 0–29)
Microalbumin, Urine: 10.1 ug/mL

## 2020-12-11 ENCOUNTER — Ambulatory Visit (INDEPENDENT_AMBULATORY_CARE_PROVIDER_SITE_OTHER): Payer: Medicare HMO | Admitting: *Deleted

## 2020-12-11 VITALS — BP 139/85 | Ht 61.0 in | Wt 96.0 lb

## 2020-12-11 DIAGNOSIS — Z0000 Encounter for general adult medical examination without abnormal findings: Secondary | ICD-10-CM

## 2020-12-11 NOTE — Progress Notes (Signed)
MEDICARE ANNUAL WELLNESS VISIT  12/11/2020  Telephone Visit Disclaimer This Medicare AWV was conducted by telephone due to national recommendations for restrictions regarding the COVID-19 Pandemic (e.g. social distancing).  I verified, using two identifiers, that I am speaking with Jamie [name] or their authorized healthcare agent. I discussed the limitations, risks, security, and privacy concerns of performing an evaluation and management service by telephone and the potential availability of an in-person appointment in the future. The patient expressed understanding and agreed to proceed.  Location of Patient: in her home Location of Provider (nurse):  In office  Subjective:    Jamie [name] is a 70 y.o. female patient of Hawks, Theador Hawthorne, FNP who had a Medicare Annual Wellness Visit today via telephone. [name] is Disabled and lives with their family. she has 1 child. she reports that she is socially active and does interact with friends/family regularly. she is minimally physically active and enjoys playing games on tablet.  Patient Care Team: Sharion Balloon, FNP as PCP - General (Nurse Practitioner)  Advanced Directives 12/11/2020 11/26/2019 02/26/2019 02/17/2019 09/16/2018 09/09/2018 09/08/2018  Does Patient Have a Medical Advance Directive? _0  Yes Yes  Type of Advance Directive - - - - - Living will Living will  Does patient want to make changes to medical advance directive? - - - - Yes (MAU/Ambulatory/Procedural Areas - Information given) No - Patient declined -  Copy of [place] in [place]  Would patient like information on creating a medical advance directive? No - Patient declined No - Patient declined No - Patient declined - Yes (MAU/Ambulatory/Procedural Areas - Information given) - -    Hospital Utilization Over the Past 12 Months: # of hospitalizations or ER visits: 0 # of surgeries: 0  Review of Systems    Patient reports  that her overall health is unchanged compared to last year.  General ROS: negative  Patient Reported Readings (BP, Pulse, CBG, Weight, etc) Ht _1  (1.549 m)   Wt 96 lb (43.5 kg)   BMI 18.14 kg/m    Pain Assessment       Current Medications & Allergies (verified) Allergies as of 12/11/2020      Reactions   Lipitor [atorvastatin] Other (See Comments)   Constipation    Soma [carisoprodol] Nausea And Vomiting      Medication List       Accurate as of December 11, 2020  1:27 PM. If you have any questions, ask your nurse or doctor.        Accu-Chek Aviva Plus test strip Generic drug: glucose blood Check BS BID Dx E11.9   Accu-Chek Aviva Plus w/Device Kit Check BS BID Dx E11.9   Accu-Chek Aviva Soln Use with glucose machine Dx E11.9   Accu-Chek FastClix Lancets Misc Use to check blood sugar two times daily  Dx Code E11.9   albuterol (2.5 MG/3ML) 0.083% nebulizer solution Commonly known as: PROVENTIL Take 3 mLs (2.5 mg total) by nebulization every 6 (six) hours as needed for wheezing or shortness of breath.   amLODipine 5 MG tablet Commonly known as: NORVASC Take 1 tablet (5 mg total) by mouth daily.   aspirin EC 81 MG tablet Take 1 tablet (81 mg total) by mouth daily. Swallow whole.   B-D SINGLE USE SWABS REGULAR Pads Test BS BID Dx E11.9   gabapentin 100 MG capsule Commonly known as: NEURONTIN Take 1 capsule (100  mg total) by mouth 2 (two) times daily.   insulin glargine 100 UNIT/ML Solostar Pen Commonly known as: LANTUS Inject 25 Units into the skin daily with breakfast.   losartan 100 MG tablet Commonly known as: COZAAR Take 1 tablet (100 mg total) by mouth daily.   Pen Needles 32G X 4 MM Misc Use to inject insulin once a day.   pravastatin 20 MG tablet Commonly known as: PRAVACHOL Take 1 tablet (20 mg total) by mouth daily.   Spiriva HandiHaler 18 MCG inhalation capsule Generic drug: tiotropium Place 1 capsule (18 mcg total) into inhaler  and inhale daily.       History (reviewed): Past Medical History:  Diagnosis Date  . Anxiety   . Breast cancer ([place])   . COPD (chronic obstructive pulmonary disease) ([place])   . Deaf   . Depression   . Diabetes ([place])   . HOH (hard of hearing)   . HTN (hypertension)   . Hyperlipidemia   . Personal history of radiation therapy   . Vitamin D deficiency    Past Surgical History:  Procedure Laterality Date  . APPLICATION OF ROBOTIC ASSISTANCE FOR SPINAL PROCEDURE N/A 02/23/2019   Procedure: APPLICATION OF ROBOTIC ASSISTANCE FOR SPINAL PROCEDURE;  Surgeon: Kristeen Miss, MD;  Location: [facility];  Service: Neurosurgery;  Laterality: N/A;  . BACK SURGERY    . BREAST SURGERY     radiation  . CARPAL TUNNEL RELEASE    . CERVICAL SPINE SURGERY    . EYE SURGERY Bilateral    cataracts  . GALLBLADDER SURGERY    . INNER EAR SURGERY    . POSTERIOR LUMBAR FUSION 4 LEVEL N/A 02/23/2019   Procedure: Thoracic ten to Lumbar two decompression with Posterior lumbar interbody fusion lumbar one-lumbar two with Osteotomy at Lumbar one-Lumbar two, Posterior arthrodesis Thoracic ten to Lumbar two with Mazor;  Surgeon: Kristeen Miss, MD;  Location: [facility];  Service: Neurosurgery;  Laterality: N/A;  . SPINE SURGERY     Family History  Problem Relation Age of Onset  . Heart attack Mother   . Stroke Mother   . Stroke Sister   . Diabetes Other        sister x3, and mother  . Lung disease Father        worked in [employer]  . Colon cancer Neg Hx   . Colon polyps Neg Hx    Social History   Socioeconomic History  . Marital status: Married    Spouse name: Not on file  . Number of children: 1  . Years of education: 81  . Highest education level: High school graduate  Occupational History  . Occupation: disability  Tobacco Use  . Smoking status: Current Every Day Smoker    Packs/day: 1.00    Years: 51.00    Pack years: 51.00    Types: Cigarettes  . Smokeless tobacco: Never Used  Vaping Use  .  Vaping Use: Never used  Substance and Sexual Activity  . Alcohol use: No  . Drug use: No  . Sexual activity: Not Currently    Birth control/protection: None  Other Topics Concern  . Not on file  Social History Narrative   Retired/disabled. Lives at home with her husband. He still works part-time. Currently following social distancing guidelines.    Social Determinants of Health   Financial Resource Strain: Not on file  Food Insecurity: Not on file  Transportation Needs: Not on file  Physical Activity: Not on file  Stress: Not on file  Social Connections: Not on file    Activities of Daily Living In your present state of health, do you have any difficulty performing the following activities: 12/11/2020  Hearing? Y  Vision? N  Difficulty concentrating or making decisions? N  Walking or climbing stairs? Y  Comment back trouble  Dressing or bathing? N  Doing errands, shopping? N  Preparing Food and eating ? N  Using the Toilet? N  In the past six months, have you accidently leaked urine? N  Managing your Medications? N  Managing your Finances? N  Housekeeping or managing your Housekeeping? N  Some recent data might be hidden    Patient Education/ Literacy    Exercise Current Exercise Habits: The patient does not participate in regular exercise at present, Exercise limited by: orthopedic condition(s)  Diet Patient reports consuming 2 meals a day and 1 snack(s) a day Patient reports that her primary diet is: Regular Patient reports that she does have regular access to food.   Depression Screen PHQ 2/9 Scores 12/11/2020 09/15/2020 06/13/2020 02/25/2020 11/26/2019 11/02/2019 07/26/2019  PHQ - 2 Score 0 0 0 0 0 0 0  PHQ- 9 Score - - - - - - -     Fall Risk Fall Risk  12/11/2020 09/15/2020 06/13/2020 02/25/2020 11/26/2019  Falls in the past year? 0 0 0 0 0  Number falls in past yr: - - - - -  Comment - - - - -  Injury with Fall? - - - - -  Risk for fall due to : - - - - -   Follow up - - - - -     Objective:  Jamie [name] seemed alert and oriented and she participated appropriately during our telephone visit.  Blood Pressure Weight BMI  BP Readings from Last 3 Encounters:  09/15/20 139/85  06/13/20 (!) 141/74  03/06/20 (!) 149/79   Wt Readings from Last 3 Encounters:  09/15/20 96 lb 9.6 oz (43.8 kg)  06/13/20 100 lb 6.4 oz (45.5 kg)  03/06/20 101 lb 12.8 oz (46.2 kg)   BMI Readings from Last 1 Encounters:  09/15/20 18.25 kg/m    *Unable to obtain current vital signs, weight, and BMI due to telephone visit type  Hearing/Vision  . Jamie [name] did seem to have difficulty with hearing/understanding during the telephone conversation . Reports that she has had a formal eye exam by an eye care professional within the past year . Reports that she has not had a formal hearing evaluation within the past year *Unable to fully assess hearing and vision during telephone visit type  Cognitive Function: 6CIT Screen 12/11/2020 11/26/2019 09/16/2018  What Year? 0 points 0 points 0 points  What month? 0 points 0 points 0 points  What time? 0 points 0 points 0 points  Count back from 20 0 points 0 points 0 points  Months in reverse 0 points 2 points 2 points  Repeat phrase 2 points 0 points 0 points  Total Score _0 (Normal:0-7, Significant for Dysfunction: >8)  Normal Cognitive Function Screening: Yes   Immunization & Health Maintenance Record Immunization History  Administered Date(s) Administered  . Fluad Quad(high Dose 65+) 07/26/2019, 07/28/2020  . Influenza Split 08/14/2012, 07/17/2013  . Influenza, High Dose Seasonal PF 07/28/2018  . Influenza,inj,Quad PF,6+ Mos 07/11/2014, 07/14/2015, 07/15/2016, 07/22/2017  . Moderna Sars-Covid-2 Vaccination 12/09/2019, 01/07/2020  . Pneumococcal Conjugate-13 02/13/2015  . Pneumococcal Polysaccharide-23 10/14/2002, 01/29/2018  . Zoster Recombinat (  Shingrix) 09/16/2018    Health Maintenance  Topic Date Due   . TETANUS/TDAP  Never done  . MAMMOGRAM  02/05/2020  . COVID-19 Vaccine (3 - Booster for Moderna series) 07/09/2020  . FOOT EXAM  11/01/2020  . Fecal DNA (Cologuard)  03/04/2021  . HEMOGLOBIN A1C  03/16/2021  . OPHTHALMOLOGY EXAM  06/28/2021  . INFLUENZA VACCINE  Completed  . DEXA SCAN  Completed  . Hepatitis C Screening  Completed  . PNA vac Low Risk Adult  Completed       Assessment  This is a routine wellness examination for Jamie [name].  Health Maintenance: Due or Overdue Health Maintenance Due  Topic Date Due  . TETANUS/TDAP  Never done  . MAMMOGRAM  02/05/2020  . COVID-19 Vaccine (3 - Booster for Moderna series) 07/09/2020  . FOOT EXAM  11/01/2020    Jamie [name] does not need a referral for Community Assistance: Care Management:   no Social Work:    no Prescription Assistance:  no Nutrition/Diabetes Education:  not applicable   Plan:  Personalized Goals Goals Addressed              This Visit's Progress   .  "I would like to control my blood sugar better" (pt-stated)   On track     Current Barriers:  . Chronic Disease Management support and education needs related to diabetes . Cost associated with diabetes management . Hearing loss  Nurse Case Manager Clinical Goal(s):  Marland Kitchen Over the next 90 days, patient will work with Dr Dorris Fetch to address needs related to diabetes management . Over the next 30 days, patient will work with nutritionist/diabetes educator  Interventions:  . Evaluation of current treatment plan related to diabetes and patient's adherence to plan as established by provider. . Reviewed medications and discussed Lantus and Novolin 70/30 o Patient is prescribed Lantus 25 units and injects it each morning o States that she has 9 full pens of lantus left. I questioned if she is taking it as prescribed because at her visit with Dr Dorris Fetch last month she reported having 10 pens. - Injecting 25 units daily one pen should last 12 days so she should  have less than 8 full pens at this time o Patient prefers to use Novolin 70/30 and not Lantus. Reiterated that Dr Dorris Fetch documented that he would switch her back to Novolin once she has used he current supply of Lantus . Discussed Dr Liliane Channel recommendation to follow-up with Jearld Fenton for diabetes education. . RN will coordinate appointment with [facility] . Discussed plans with patient for ongoing care management follow up and provided patient with direct contact information for care management team . Reviewed scheduled/upcoming provider appointments including: Evelina Dun, FNP on 01/31/20 and Dr Dorris Fetch on 03/01/2020 . Advised patient, providing education and rationale, to check cbg twice daily as instructed by Dr Dorris Fetch and record. Take blood sugar meter and log to appointment on 2/19. . Advised to reach out to Dr Dorris Fetch with blood sugar readings below 70 or above 200  Patient Self Care Activities:  . Self administers medications as prescribed . Calls pharmacy for medication refills . Calls provider office for new concerns or questions  Please see past updates related to this goal by clicking on the "Past Updates" button in the selected goal      .  DIET - INCREASE WATER INTAKE   On track     Try to drink 6-8 glasses of water daily  Personalized Health Maintenance & Screening Recommendations  needs foot check   Lung Cancer Screening Recommended: no (Low Dose CT Chest recommended if Age 32-80 years, 30 pack-year currently smoking OR have quit w/in past 15 years) Hepatitis C Screening recommended: no HIV Screening recommended: no  Advanced Directives: Written information was not prepared per patient's request.  Referrals & Orders No orders of the defined types were placed in this encounter.   Follow-up Plan . Follow-up with Sharion Balloon, FNP as planned    I have personally reviewed and noted the following in the patient's chart:   . Medical and social history . Use  of alcohol, tobacco or illicit drugs  . Current medications and supplements . Functional ability and status . Nutritional status . Physical activity . Advanced directives . List of other physicians . Hospitalizations, surgeries, and ER visits in previous 12 months . Vitals . Screenings to include cognitive, depression, and falls . Referrals and appointments  In addition, I have reviewed and discussed with Jamie [name] certain preventive protocols, quality metrics, and best practice recommendations. A written personalized care plan for preventive services as well as general preventive health recommendations is available and can be mailed to the patient at her request.      [place] Dec  12/11/2020

## 2020-12-11 NOTE — Patient Instructions (Signed)
[name] , Thank you for taking time to come for your Medicare Wellness Visit. I appreciate your ongoing commitment to your health goals. Please review the following plan we discussed and let me know if I can assist you in the future.   These are the goals we discussed: Goals    .  "I want to keep my back pain under control" (pt-stated)      Current Barriers:  . Chronic Disease Management support and education needs related to chronic back pain  Nurse Case Manager Clinical Goal(s):  Marland Kitchen Over the next 30 days, patient will verbalize understanding of plan for management of chronic back pain . Over the next 30 days, patient will meet with RN Care Manager to address plan for self management of chronic back pain  Interventions:  . Advised patient to take medications as prescribed . Provided education to patient re: positive effects of exercise and physical fitness on chronic back pain . Discussed characteristics of current pain, current treatments, and prior treatments . Recommended to consider physical therapy for abd/back strength training Patient Self Care Activities:  . Self administers medications as prescribed . Attends all scheduled provider appointments . Calls provider office for new concerns or questions  Initial goal documentation     .  "I would like to be able to hear better" (pt-stated)      Current Barriers:  . Film/video editor.  . Chronic Disease Management support and education needs related to hearing loss and need for hearing aids  Nurse Case Manager Clinical Goal(s):  Marland Kitchen Over the next 30 days, patient will work with [place] to address needs related to obtaining hearing aids.   Interventions:  . Chart Reviewed . Talked with patient by phone . Discussed hx of hearing loss . Care guide referral for assistance with affordable hearing aids  Patient Self Care Activities:  . Self administers medications as prescribed . Attends all scheduled provider  appointments . Calls provider office for new concerns or questions  Initial goal documentation     .  "I would like to control my blood sugar better" (pt-stated)      Current Barriers:  . Chronic Disease Management support and education needs related to diabetes . Cost associated with diabetes management . Hearing loss  Nurse Case Manager Clinical Goal(s):  Marland Kitchen Over the next 90 days, patient will work with Dr Dorris Fetch to address needs related to diabetes management . Over the next 30 days, patient will work with nutritionist/diabetes educator  Interventions:  . Evaluation of current treatment plan related to diabetes and patient's adherence to plan as established by provider. . Reviewed medications and discussed Lantus and Novolin 70/30 o Patient is prescribed Lantus 25 units and injects it each morning o States that she has 9 full pens of lantus left. I questioned if she is taking it as prescribed because at her visit with Dr Dorris Fetch last month she reported having 10 pens. - Injecting 25 units daily one pen should last 12 days so she should have less than 8 full pens at this time o Patient prefers to use Novolin 70/30 and not Lantus. Reiterated that Dr Dorris Fetch documented that he would switch her back to Novolin once she has used he current supply of Lantus . Discussed Dr Liliane Channel recommendation to follow-up with Jearld Fenton for diabetes education. . RN will coordinate appointment with [facility] . Discussed plans with patient for ongoing care management follow up and provided patient with direct contact  information for care management team . Reviewed scheduled/upcoming provider appointments including: Evelina Dun, FNP on 01/31/20 and Dr Dorris Fetch on 03/01/2020 . Advised patient, providing education and rationale, to check cbg twice daily as instructed by Dr Dorris Fetch and record. Take blood sugar meter and log to appointment on 2/19. . Advised to reach out to Dr Dorris Fetch with blood sugar readings below 70 or  above 200  Patient Self Care Activities:  . Self administers medications as prescribed . Calls pharmacy for medication refills . Calls provider office for new concerns or questions  Please see past updates related to this goal by clicking on the "Past Updates" button in the selected goal      .  DIET - INCREASE WATER INTAKE      Try to drink 6-8 glasses of water daily    .  Have 3 meals a day      Continue to have 3 balanced meals per day.     .  Quit Smoking      Barriers: Tobacco addiction  Nurse Care Manager Clinical Goals: Over the next 3 days, patient will decrease by half the amount of cigarettes she smokes.   Interventions:  . Smoking cessation resources printed and will be mailed.   Patient Self Care Activities:  . Self administers medications as prescribed . Calls provider office for new concerns or questions  Initial goal documentation        This is a list of the screening recommended for you and due dates:  Health Maintenance  Topic Date Due  . Tetanus Vaccine  Never done  . Mammogram  02/05/2020  . COVID-19 Vaccine (3 - Booster for Moderna series) 07/09/2020  . Complete foot exam   11/01/2020  . Cologuard (Stool DNA test)  03/04/2021  . Hemoglobin A1C  03/16/2021  . Eye exam for diabetics  06/28/2021  . Flu Shot  Completed  . DEXA scan (bone density measurement)  Completed  .  Hepatitis C: One time screening is recommended by Center for Disease Control  (CDC) for  adults born from [birth year] through 1964.   Completed  . Pneumonia vaccines  Completed

## 2020-12-19 ENCOUNTER — Encounter: Payer: Self-pay | Admitting: Family

## 2020-12-19 ENCOUNTER — Ambulatory Visit (INDEPENDENT_AMBULATORY_CARE_PROVIDER_SITE_OTHER): Payer: Medicare HMO | Admitting: Family

## 2020-12-19 ENCOUNTER — Other Ambulatory Visit: Payer: Self-pay

## 2020-12-19 VITALS — BP 156/90 | HR 79 | Temp 96.0°F | Ht 61.0 in | Wt 99.8 lb

## 2020-12-19 DIAGNOSIS — I2510 Atherosclerotic heart disease of native coronary artery without angina pectoris: Secondary | ICD-10-CM | POA: Diagnosis not present

## 2020-12-19 DIAGNOSIS — J449 Chronic obstructive pulmonary disease, unspecified: Secondary | ICD-10-CM

## 2020-12-19 DIAGNOSIS — K219 Gastro-esophageal reflux disease without esophagitis: Secondary | ICD-10-CM

## 2020-12-19 DIAGNOSIS — E1169 Type 2 diabetes mellitus with other specified complication: Secondary | ICD-10-CM

## 2020-12-19 DIAGNOSIS — H9193 Unspecified hearing loss, bilateral: Secondary | ICD-10-CM | POA: Diagnosis not present

## 2020-12-19 DIAGNOSIS — E785 Hyperlipidemia, unspecified: Secondary | ICD-10-CM | POA: Diagnosis not present

## 2020-12-19 DIAGNOSIS — I2584 Coronary atherosclerosis due to calcified coronary lesion: Secondary | ICD-10-CM | POA: Diagnosis not present

## 2020-12-19 DIAGNOSIS — F17200 Nicotine dependence, unspecified, uncomplicated: Secondary | ICD-10-CM

## 2020-12-19 DIAGNOSIS — E0865 Diabetes mellitus due to underlying condition with hyperglycemia: Secondary | ICD-10-CM

## 2020-12-19 DIAGNOSIS — E559 Vitamin D deficiency, unspecified: Secondary | ICD-10-CM

## 2020-12-19 DIAGNOSIS — E1159 Type 2 diabetes mellitus with other circulatory complications: Secondary | ICD-10-CM

## 2020-12-19 DIAGNOSIS — M5416 Radiculopathy, lumbar region: Secondary | ICD-10-CM | POA: Diagnosis not present

## 2020-12-19 DIAGNOSIS — I10 Essential (primary) hypertension: Secondary | ICD-10-CM

## 2020-12-19 DIAGNOSIS — G8929 Other chronic pain: Secondary | ICD-10-CM

## 2020-12-19 DIAGNOSIS — M5441 Lumbago with sciatica, right side: Secondary | ICD-10-CM

## 2020-12-19 LAB — BAYER DCA HB A1C WAIVED: HB A1C (BAYER DCA - WAIVED): 7.7 % — ABNORMAL HIGH (ref <7.0)

## 2020-12-19 MED ORDER — INSULIN GLARGINE 100 UNIT/ML SOLOSTAR PEN: 25.0000 [IU] | PEN_INJECTOR | Freq: Every day | SUBCUTANEOUS | 2 refills | Status: DC

## 2020-12-19 MED ORDER — LOSARTAN POTASSIUM 100 MG PO TABS: 100.0000 mg | ORAL_TABLET | Freq: Every day | ORAL | 1 refills | Status: DC

## 2020-12-19 NOTE — Patient Instructions (Signed)
Diabetes Mellitus and Nutrition, Adult When you have diabetes, or diabetes mellitus, it is very important to have healthy eating habits because your blood sugar (glucose) levels are greatly affected by what you eat and drink. Eating healthy foods in the right amounts, at about the same times every day, can help you:  Control your blood glucose.  Lower your risk of heart disease.  Improve your blood pressure.  Reach or maintain a healthy weight. What can affect my meal plan? Every person with diabetes is different, and each person has different needs for a meal plan. Your health care provider may recommend that you work with a dietitian to make a meal plan that is best for you. Your meal plan may vary depending on factors such as:  The calories you need.  The medicines you take.  Your weight.  Your blood glucose, blood pressure, and cholesterol levels.  Your activity level.  Other health conditions you have, such as heart or kidney disease. How do carbohydrates affect me? Carbohydrates, also called carbs, affect your blood glucose level more than any other type of food. Eating carbs naturally raises the amount of glucose in your blood. Carb counting is a method for keeping track of how many carbs you eat. Counting carbs is important to keep your blood glucose at a healthy level, especially if you use insulin or take certain oral diabetes medicines. It is important to know how many carbs you can safely have in each meal. This is different for every person. Your dietitian can help you calculate how many carbs you should have at each meal and for each snack. How does alcohol affect me? Alcohol can cause a sudden decrease in blood glucose (hypoglycemia), especially if you use insulin or take certain oral diabetes medicines. Hypoglycemia can be a life-threatening condition. Symptoms of hypoglycemia, such as sleepiness, dizziness, and confusion, are similar to symptoms of having too much  alcohol.  Do not drink alcohol if: ? Your health care provider tells you not to drink. ? You are pregnant, may be pregnant, or are planning to become pregnant.  If you drink alcohol: ? Do not drink on an empty stomach. ? Limit how much you use to:  0-1 drink a day for women.  0-2 drinks a day for men. ? Be aware of how much alcohol is in your drink. In the [place] one drink equals one 12 oz bottle of beer (355 mL), one 5 oz glass of wine (148 mL), or one 1 oz glass of hard liquor (44 mL). ? Keep yourself hydrated with water, diet soda, or unsweetened iced tea.  Keep in mind that regular soda, juice, and other mixers may contain a lot of sugar and must be counted as carbs. What are tips for following this plan? Reading food labels  Start by checking the serving size on the "Nutrition Facts" label of packaged foods and drinks. The amount of calories, carbs, fats, and other nutrients listed on the label is based on one serving of the item. Many items contain more than one serving per package.  Check the total grams (g) of carbs in one serving. You can calculate the number of servings of carbs in one serving by dividing the total carbs by 15. For example, if a food has 30 g of total carbs per serving, it would be equal to 2 servings of carbs.  Check the number of grams (g) of saturated fats and trans fats in one serving. Choose foods that have   a low amount or none of these fats.  Check the number of milligrams (mg) of salt (sodium) in one serving. Most people should limit total sodium intake to less than 2,300 mg per day.  Always check the nutrition information of foods labeled as "low-fat" or "nonfat." These foods may be higher in added sugar or refined carbs and should be avoided.  Talk to your dietitian to identify your daily goals for nutrients listed on the label. Shopping  Avoid buying canned, pre-made, or processed foods. These foods tend to be high in fat, sodium, and added  sugar.  Shop around the outside edge of the grocery store. This is where you will most often find fresh fruits and vegetables, bulk grains, fresh meats, and fresh dairy. Cooking  Use low-heat cooking methods, such as baking, instead of high-heat cooking methods like deep frying.  Cook using healthy oils, such as olive, canola, or sunflower oil.  Avoid cooking with butter, cream, or high-fat meats. Meal planning  Eat meals and snacks regularly, preferably at the same times every day. Avoid going long periods of time without eating.  Eat foods that are high in fiber, such as fresh fruits, vegetables, beans, and whole grains. Talk with your dietitian about how many servings of carbs you can eat at each meal.  Eat 4-6 oz (112-168 g) of lean protein each day, such as lean meat, chicken, fish, eggs, or tofu. One ounce (oz) of lean protein is equal to: ? 1 oz (28 g) of meat, chicken, or fish. ? 1 egg. ?  cup (62 g) of tofu.  Eat some foods each day that contain healthy fats, such as avocado, nuts, seeds, and fish.   What foods should I eat? Fruits Berries. Apples. Oranges. Peaches. Apricots. Plums. Grapes. Mango. Papaya. Pomegranate. Kiwi. Cherries. Vegetables Lettuce. Spinach. Leafy greens, including kale, chard, collard greens, and mustard greens. Beets. Cauliflower. Cabbage. Broccoli. Carrots. Green beans. Tomatoes. Peppers. Onions. Cucumbers. Brussels sprouts. Grains Whole grains, such as whole-wheat or whole-grain bread, crackers, tortillas, cereal, and pasta. Unsweetened oatmeal. Quinoa. Brown or wild rice. Meats and other proteins Seafood. Poultry without skin. Lean cuts of poultry and beef. Tofu. Nuts. Seeds. Dairy Low-fat or fat-free dairy products such as milk, yogurt, and cheese. The items listed above may not be a complete list of foods and beverages you can eat. Contact a dietitian for more information. What foods should I avoid? Fruits Fruits canned with  syrup. Vegetables Canned vegetables. Frozen vegetables with butter or cream sauce. Grains Refined white flour and flour products such as bread, pasta, snack foods, and cereals. Avoid all processed foods. Meats and other proteins Fatty cuts of meat. Poultry with skin. Breaded or fried meats. Processed meat. Avoid saturated fats. Dairy Full-fat yogurt, cheese, or milk. Beverages Sweetened drinks, such as soda or iced tea. The items listed above may not be a complete list of foods and beverages you should avoid. Contact a dietitian for more information. Questions to ask a health care provider  Do I need to meet with a diabetes educator?  Do I need to meet with a dietitian?  What number can I call if I have questions?  When are the best times to check my blood glucose? Where to find more information:  American Diabetes Association: diabetes.org  Academy of Nutrition and Dietetics: www.eatright.org  National Institute of Diabetes and Digestive and Kidney Diseases: www.niddk.nih.gov  Association of Diabetes Care and Education Specialists: www.diabeteseducator.org Summary  It is important to have healthy eating   habits because your blood sugar (glucose) levels are greatly affected by what you eat and drink.  A healthy meal plan will help you control your blood glucose and maintain a healthy lifestyle.  Your health care provider may recommend that you work with a dietitian to make a meal plan that is best for you.  Keep in mind that carbohydrates (carbs) and alcohol have immediate effects on your blood glucose levels. It is important to count carbs and to use alcohol carefully. This information is not intended to replace advice given to you by your health care provider. Make sure you discuss any questions you have with your health care provider. Document Revised: 09/07/2019 Document Reviewed: 09/07/2019 Elsevier Patient Education  2021 Elsevier Inc.  

## 2020-12-19 NOTE — Progress Notes (Signed)
Subjective:    Patient ID: Jamie [name], female    DOB: 1951-01-04, 70 y.o.   MRN: [id number]  Chief Complaint  Patient presents with  . Medical Management of Chronic Issues  . Depression  . Diabetes    PT presents to the office today for chronic follow up.PT had surgical decompression and stabilization from L2-L4 on 02/05/17. Pt states her back continues to hurtand her pain is a8out 10 when she walks or standing.  Her A1C is improved today. She is followed by Endocrinologists. Depression        This is a chronic problem.  The current episode started more than 1 year ago.   The onset quality is gradual.   Associated symptoms include hopelessness, irritable, restlessness and sad. Diabetes She presents for her follow-up diabetic visit. She has type 2 diabetes mellitus. There are no hypoglycemic associated symptoms. Pertinent negatives for diabetes include no blurred vision and no foot paresthesias. There are no hypoglycemic complications. Symptoms are stable. Diabetic complications include heart disease. Pertinent negatives for diabetic complications include no PVD. Risk factors for coronary artery disease include dyslipidemia, diabetes mellitus, hypertension, post-menopausal and sedentary lifestyle. She is following a generally healthy diet. Her overall blood glucose range is 90-110 mg/dl. An ACE inhibitor/angiotensin II receptor blocker is being taken. Eye exam is not current.  Hypertension This is a chronic problem. The current episode started more than 1 year ago. The problem has been resolved since onset. The problem is controlled. Pertinent negatives include no blurred vision, malaise/fatigue, peripheral edema or shortness of breath. Risk factors for coronary artery disease include dyslipidemia, diabetes mellitus, sedentary lifestyle and smoking/tobacco exposure. The current treatment provides moderate improvement. There is no history of PVD.  Hyperlipidemia This is a chronic  problem. The current episode started more than 1 year ago. The problem is controlled. Factors aggravating her hyperlipidemia include smoking. Pertinent negatives include no shortness of breath. Current antihyperlipidemic treatment includes statins. The current treatment provides moderate improvement of lipids. Risk factors for coronary artery disease include dyslipidemia, diabetes mellitus, hypertension, a sedentary lifestyle and post-menopausal.  COPD    Review of Systems  Constitutional: Negative for malaise/fatigue.  Eyes: Negative for blurred vision.  Respiratory: Negative for shortness of breath.   Musculoskeletal: Positive for back pain.  Psychiatric/Behavioral: Positive for depression.  All other systems reviewed and are negative.      Objective:   Physical Exam Vitals reviewed.  Constitutional:      General: She is irritable. She is not in acute distress.    Appearance: She is well-developed and well-nourished.  HENT:     Head: Normocephalic and atraumatic.     Right Ear: Tympanic membrane normal.     Left Ear: Tympanic membrane normal.     Mouth/Throat:     Mouth: Oropharynx is clear and moist.  Eyes:     Pupils: Pupils are equal, round, and reactive to light.  Neck:     Thyroid: No thyromegaly.  Cardiovascular:     Rate and Rhythm: Normal rate and regular rhythm.     Pulses: Intact distal pulses.     Heart sounds: Normal heart sounds. No murmur heard.   Pulmonary:     Effort: Pulmonary effort is normal. No respiratory distress.     Breath sounds: Rhonchi present. No wheezing.  Abdominal:     General: Bowel sounds are normal. There is no distension.     Palpations: Abdomen is soft.     Tenderness: There is no  abdominal tenderness.  Musculoskeletal:        General: No edema.     Cervical back: Normal range of motion and neck supple. No tenderness.     Comments: Pain in lumbar with flexion and extension  Skin:    General: Skin is warm and dry.  Neurological:      Mental Status: She is alert and oriented to person, place, and time.     Cranial Nerves: No cranial nerve deficit.     Deep Tendon Reflexes: Reflexes are normal and symmetric.  Psychiatric:        Mood and Affect: Mood and affect normal.        Behavior: Behavior normal.        Thought Content: Thought content normal.        Judgment: Judgment normal.       Diabetic Foot Exam - Simple   Simple Foot Form Diabetic Foot exam was performed with the following findings: Yes 12/19/2020 11:35 AM  Visual Inspection No deformities, no ulcerations, no other skin breakdown bilaterally: Yes Sensation Testing Intact to touch and monofilament testing bilaterally: Yes Pulse Check Posterior Tibialis and Dorsalis pulse intact bilaterally: Yes Comments      BP (!) 156/90   Pulse 79   Temp (!) 96 F (35.6 C) (Temporal)   Ht _0  (1.549 m)   Wt 99 lb 12.8 oz (45.3 kg)   SpO2 96%   BMI 18.86 kg/m      Assessment & Plan:  Jamie [name] comes in today with chief complaint of Medical Management of Chronic Issues, Depression, and Diabetes   Diagnosis and orders addressed:  1. Essential hypertension - losartan (COZAAR) 100 MG tablet; Take 1 tablet (100 mg total) by mouth daily.  Dispense: 90 tablet; Refill: 1 - CMP14+EGFR - CBC with Differential/Platelet  2. DM type 2 causing vascular disease (HCC) - insulin glargine (LANTUS) 100 UNIT/ML Solostar Pen; Inject 25 Units into the skin daily with breakfast.  Dispense: 30 mL; Refill: 2 - Bayer DCA Hb A1c Waived - CMP14+EGFR - CBC with Differential/Platelet  3. Essential hypertension, benign - CMP14+EGFR - CBC with Differential/Platelet  4. Coronary artery calcification - CMP14+EGFR - CBC with Differential/Platelet  5. Moderate COPD (chronic obstructive pulmonary disease) (HCC) - CMP14+EGFR - CBC with Differential/Platelet  6. Gastroesophageal reflux disease without esophagitis - CMP14+EGFR - CBC with Differential/Platelet  7.  Hyperlipidemia associated with type 2 diabetes mellitus (HCC) - CMP14+EGFR - CBC with Differential/Platelet  8. Diabetes mellitus due to underlying condition, uncontrolled, with hyperglycemia, without long-term current use of insulin (HCC) - CMP14+EGFR - CBC with Differential/Platelet  9. Bilateral hearing loss, unspecified hearing loss type - CMP14+EGFR - CBC with Differential/Platelet  10. Lumbar radiculopathy, chronic - CMP14+EGFR - CBC with Differential/Platelet  11. Current smoker - CMP14+EGFR - CBC with Differential/Platelet  12. Vitamin D deficiency - CMP14+EGFR - CBC with Differential/Platelet  13. Chronic bilateral low back pain with right-sided sciatica - CMP14+EGFR - CBC with Differential/Platelet   Labs pending Health Maintenance reviewed Diet and exercise encouraged  Follow up plan: 3 months    Evelina Dun, FNP

## 2020-12-20 LAB — CBC WITH DIFFERENTIAL/PLATELET
Basophils Absolute: 0.1 10*3/uL (ref 0.0–0.2)
Basos: 1 %
EOS (ABSOLUTE): 0.2 10*3/uL (ref 0.0–0.4)
Eos: 2 %
Hematocrit: 36.6 % (ref 34.0–46.6)
Hemoglobin: 12.1 g/dL (ref 11.1–15.9)
Immature Grans (Abs): 0 10*3/uL (ref 0.0–0.1)
Immature Granulocytes: 1 %
Lymphocytes Absolute: 1.8 10*3/uL (ref 0.7–3.1)
Lymphs: 21 %
MCH: 25.9 pg — ABNORMAL LOW (ref 26.6–33.0)
MCHC: 33.1 g/dL (ref 31.5–35.7)
MCV: 78 fL — ABNORMAL LOW (ref 79–97)
Monocytes Absolute: 0.6 10*3/uL (ref 0.1–0.9)
Monocytes: 7 %
Neutrophils Absolute: 5.8 10*3/uL (ref 1.4–7.0)
Neutrophils: 68 %
Platelets: 269 10*3/uL (ref 150–450)
RBC: 4.67 x10E6/uL (ref 3.77–5.28)
RDW: 13.6 % (ref 11.7–15.4)
WBC: 8.4 10*3/uL (ref 3.4–10.8)

## 2020-12-20 LAB — CMP14+EGFR
ALT: 9 IU/L (ref 0–32)
AST: 14 IU/L (ref 0–40)
Albumin/Globulin Ratio: 1.5 (ref 1.2–2.2)
Albumin: 3.8 g/dL (ref 3.8–4.8)
Alkaline Phosphatase: 112 IU/L (ref 44–121)
BUN/Creatinine Ratio: 23 (ref 12–28)
BUN: 18 mg/dL (ref 8–27)
Bilirubin Total: <0.2 mg/dL (ref 0.0–1.2)
CO2: 23 mmol/L (ref 20–29)
Calcium: 9 mg/dL (ref 8.7–10.3)
Chloride: 102 mmol/L (ref 96–106)
Creatinine, Ser: 0.79 mg/dL (ref 0.57–1.00)
Globulin, Total: 2.6 g/dL (ref 1.5–4.5)
Glucose: 127 mg/dL — ABNORMAL HIGH (ref 65–99)
Potassium: 4.5 mmol/L (ref 3.5–5.2)
Sodium: 138 mmol/L (ref 134–144)
Total Protein: 6.4 g/dL (ref 6.0–8.5)
eGFR: 81 mL/min/{1.73_m2} (ref >59)

## 2021-01-25 ENCOUNTER — Telehealth: Payer: Self-pay

## 2021-01-29 MED ORDER — PRAVASTATIN SODIUM 40 MG PO TABS: 40.0000 mg | ORAL_TABLET | Freq: Every day | ORAL | 2 refills | Status: DC

## 2021-01-29 NOTE — Telephone Encounter (Signed)
Pravastatin increased to 40 mg from 20 mg.

## 2021-03-14 ENCOUNTER — Telehealth: Payer: Self-pay | Admitting: Family

## 2021-03-14 NOTE — Telephone Encounter (Signed)
Dentist aware that patient is not currently on any osteoporosis medication.

## 2021-03-14 NOTE — Telephone Encounter (Signed)
Dentist office calling to see if the patient has taken osteoarthritis medicine.  Could someone please call them.

## 2021-04-04 ENCOUNTER — Other Ambulatory Visit: Payer: Self-pay | Admitting: Family

## 2021-04-04 DIAGNOSIS — I10 Essential (primary) hypertension: Secondary | ICD-10-CM

## 2021-04-11 ENCOUNTER — Ambulatory Visit (INDEPENDENT_AMBULATORY_CARE_PROVIDER_SITE_OTHER): Payer: Medicare HMO | Admitting: Family

## 2021-04-11 ENCOUNTER — Encounter: Payer: Self-pay | Admitting: Family

## 2021-04-11 ENCOUNTER — Other Ambulatory Visit: Payer: Self-pay

## 2021-04-11 VITALS — BP 118/72 | HR 76 | Temp 97.8°F | Ht 60.0 in | Wt 100.0 lb

## 2021-04-11 DIAGNOSIS — E1159 Type 2 diabetes mellitus with other circulatory complications: Secondary | ICD-10-CM | POA: Diagnosis not present

## 2021-04-11 DIAGNOSIS — E1169 Type 2 diabetes mellitus with other specified complication: Secondary | ICD-10-CM

## 2021-04-11 DIAGNOSIS — E785 Hyperlipidemia, unspecified: Secondary | ICD-10-CM | POA: Diagnosis not present

## 2021-04-11 DIAGNOSIS — M5441 Lumbago with sciatica, right side: Secondary | ICD-10-CM

## 2021-04-11 DIAGNOSIS — H9193 Unspecified hearing loss, bilateral: Secondary | ICD-10-CM

## 2021-04-11 DIAGNOSIS — J449 Chronic obstructive pulmonary disease, unspecified: Secondary | ICD-10-CM

## 2021-04-11 DIAGNOSIS — J441 Chronic obstructive pulmonary disease with (acute) exacerbation: Secondary | ICD-10-CM | POA: Diagnosis not present

## 2021-04-11 DIAGNOSIS — M5416 Radiculopathy, lumbar region: Secondary | ICD-10-CM | POA: Diagnosis not present

## 2021-04-11 DIAGNOSIS — G8929 Other chronic pain: Secondary | ICD-10-CM

## 2021-04-11 DIAGNOSIS — K219 Gastro-esophageal reflux disease without esophagitis: Secondary | ICD-10-CM

## 2021-04-11 DIAGNOSIS — I10 Essential (primary) hypertension: Secondary | ICD-10-CM | POA: Diagnosis not present

## 2021-04-11 DIAGNOSIS — E0865 Diabetes mellitus due to underlying condition with hyperglycemia: Secondary | ICD-10-CM | POA: Diagnosis not present

## 2021-04-11 LAB — BAYER DCA HB A1C WAIVED: HB A1C (BAYER DCA - WAIVED): 7.5 % — ABNORMAL HIGH (ref <7.0)

## 2021-04-11 MED ORDER — GABAPENTIN 100 MG PO CAPS: 100.0000 mg | ORAL_CAPSULE | Freq: Three times a day (TID) | ORAL | 3 refills | Status: DC

## 2021-04-11 MED ORDER — SPIRIVA HANDIHALER 18 MCG IN CAPS: 18.0000 ug | ORAL_CAPSULE | Freq: Every day | RESPIRATORY_TRACT | 12 refills | Status: DC

## 2021-04-11 MED ORDER — LOSARTAN POTASSIUM 100 MG PO TABS
100.0000 mg | ORAL_TABLET | Freq: Every day | ORAL | 1 refills | Status: DC
Start: 2021-04-11 — End: 2022-01-04

## 2021-04-11 MED ORDER — BACLOFEN 5 MG PO TABS: 5.0000 mg | ORAL_TABLET | Freq: Two times a day (BID) | ORAL | 2 refills | Status: DC

## 2021-04-11 MED ORDER — INSULIN GLARGINE 100 UNIT/ML SOLOSTAR PEN: 25.0000 [IU] | PEN_INJECTOR | Freq: Every day | SUBCUTANEOUS | 2 refills | Status: DC

## 2021-04-11 MED ORDER — AMLODIPINE BESYLATE 5 MG PO TABS: 5.0000 mg | ORAL_TABLET | Freq: Every day | ORAL | 0 refills | Status: DC

## 2021-04-11 MED ORDER — PRAVASTATIN SODIUM 40 MG PO TABS: 40.0000 mg | ORAL_TABLET | Freq: Every day | ORAL | 2 refills | Status: DC

## 2021-04-11 NOTE — Patient Instructions (Signed)
https://[email or web address]">  Chronic Back Pain When back pain lasts longer than 3 months, it is called chronic back pain. The cause of your back pain may not be known. Some common causes include: Wear and tear (degenerative disease) of the bones, ligaments, or disks in your back. Inflammation and stiffness in your back (arthritis). People who have chronic back pain often go through certain periods in which the pain is more intense (flare-ups). Many people can learn to manage the pain with home care. Follow these instructions at home: Pay attention to any changes in your symptoms. Take these actions to help withyour pain: Managing pain and stiffness     If directed, apply ice to the painful area. Your health care provider may recommend applying ice during the first 24-48 hours after a flare-up begins. To do this: Put ice in a plastic bag. Place a towel between your skin and the bag. Leave the ice on for 20 minutes, 2-3 times per day. If directed, apply heat to the affected area as often as told by your health care provider. Use the heat source that your health care provider recommends, such as a moist heat pack or a heating pad. Place a towel between your skin and the heat source. Leave the heat on for 20-30 minutes. Remove the heat if your skin turns bright red. This is especially important if you are unable to feel pain, heat, or cold. You may have a greater risk of getting burned. Try soaking in a warm tub. Activity  Avoid bending and other activities that make the problem worse. Maintain a proper position when standing or sitting: When standing, keep your upper back and neck straight, with your shoulders pulled back. Avoid slouching. When sitting, keep your back straight and relax your shoulders. Do not round your shoulders or pull them backward. Do not sit or stand in one place for long periods of time. Take brief periods of rest throughout the day. This will reduce your  pain. Resting in a lying or standing position is usually better than sitting to rest. When you are resting for longer periods, mix in some mild activity or stretching between periods of rest. This will help to prevent stiffness and pain. Get regular exercise. Ask your health care provider what activities are safe for you. Do not lift anything that is heavier than 10 lb (4.5 kg), or the limit that you are told, until your health care provider says that it is safe. Always use proper lifting technique, which includes: Bending your knees. Keeping the load close to your body. Avoiding twisting. Sleep on a firm mattress in a comfortable position. Try lying on your side with your knees slightly bent. If you lie on your back, put a pillow under your knees.  Medicines Treatment may include medicines for pain and inflammation taken by mouth or applied to the skin, prescription pain medicine, or muscle relaxants. Take over-the-counter and prescription medicines only as told by your health care provider. Ask your health care provider if the medicine prescribed to you: Requires you to avoid driving or using machinery. Can cause constipation. You may need to take these actions to prevent or treat constipation: Drink enough fluid to keep your urine pale yellow. Take over-the-counter or prescription medicines. Eat foods that are high in fiber, such as beans, whole grains, and fresh fruits and vegetables. Limit foods that are high in fat and processed sugars, such as fried or sweet foods. General instructions Do not use any products that contain   nicotine or tobacco, such as cigarettes, e-cigarettes, and chewing tobacco. If you need help quitting, ask your health care provider. Keep all follow-up visits as told by your health care provider. This is important. Contact a health care provider if: You have pain that is not relieved with rest or medicine. Your pain gets worse, or you have new pain. You have a high  fever. You have rapid weight loss. You have trouble doing your normal activities. Get help right away if: You have weakness or numbness in one or both of your legs or feet. You have trouble controlling your bladder or your bowels. You have severe back pain and have any of the following: Nausea or vomiting. Pain in your abdomen. Shortness of breath or you faint. Summary Chronic back pain is back pain that lasts longer than 3 months. When a flare-up begins, apply ice to the painful area for the first 24-48 hours. Apply a moist heat pad or use a heating pad on the painful area as directed by your health care provider. When you are resting for longer periods, mix in some mild activity or stretching between periods of rest. This will help to prevent stiffness and pain. This information is not intended to replace advice given to you by your health care provider. Make sure you discuss any questions you have with your healthcare provider. Document Revised: 11/10/2019 Document Reviewed: 11/10/2019 Elsevier Patient Education  2022 [employer]  

## 2021-04-11 NOTE — Progress Notes (Signed)
 Subjective:    Patient ID: Jamie [name], female    DOB: 05/14/1951, 70 y.o.   MRN: [id number]  Chief Complaint  Patient presents with   Medical Management of Chronic Issues    PT presents to the office today for chronic follow up.  PT had surgical decompression and stabilization from L2-L4 on 02/05/17. Pt states her back continues to hurt and her pain is a 8 out 10 when she walks or standing.   Her A1C is improved today. She is followed by  Endocrinologists.  Hypertension This is a chronic problem. The current episode started more than 1 year ago. The problem has been resolved since onset. The problem is controlled. Associated symptoms include malaise/fatigue. Pertinent negatives include no blurred vision, peripheral edema or shortness of breath. Risk factors for coronary artery disease include dyslipidemia, diabetes mellitus and sedentary lifestyle. The current treatment provides moderate improvement. Hypertensive end-organ damage includes kidney disease and CAD/MI. There is no history of heart failure.  Diabetes She presents for her follow-up diabetic visit. She has type 2 diabetes mellitus. Pertinent negatives for diabetes include no blurred vision and no foot paresthesias. Symptoms are stable. Diabetic complications include heart disease. Risk factors for coronary artery disease include dyslipidemia, diabetes mellitus, hypertension and sedentary lifestyle. Her overall blood glucose range is 130-140 mg/dl. An ACE inhibitor/angiotensin II receptor blocker is being taken.  Gastroesophageal Reflux She complains of belching and heartburn. This is a chronic problem. The current episode started in the past 7 days. The problem occurs occasionally. The symptoms are aggravated by certain foods. She has tried a PPI for the symptoms. The treatment provided moderate relief.  Hyperlipidemia This is a chronic problem. The current episode started more than 1 year ago. The problem is controlled. Pertinent  negatives include no shortness of breath. Current antihyperlipidemic treatment includes statins. Risk factors for coronary artery disease include dyslipidemia, diabetes mellitus, hypertension and a sedentary lifestyle.  Back Pain This is a chronic problem. The current episode started more than 1 year ago. The problem occurs intermittently. The pain is present in the lumbar spine. The quality of the pain is described as aching. The pain is at a severity of 10/10. She has tried bed rest for the symptoms. The treatment provided moderate relief.  Nicotine Dependence Presents for follow-up visit. Her urge triggers include company of smokers. The symptoms have been stable. She smokes 1 pack of cigarettes per day.  COPD Continues to smoke a pack a day. States breathing is stable.    Review of Systems  Constitutional:  Positive for malaise/fatigue.  Eyes:  Negative for blurred vision.  Respiratory:  Negative for shortness of breath.   Gastrointestinal:  Positive for heartburn.  Musculoskeletal:  Positive for back pain.  All other systems reviewed and are negative.     Objective:   Physical Exam Vitals reviewed.  Constitutional:      General: She is not in acute distress.    Appearance: She is well-developed.     Comments: underweight  HENT:     Head: Normocephalic and atraumatic.     Right Ear: External ear normal.  Eyes:     Pupils: Pupils are equal, round, and reactive to light.  Neck:     Thyroid: No thyromegaly.  Cardiovascular:     Rate and Rhythm: Normal rate and regular rhythm.     Heart sounds: Normal heart sounds. No murmur heard. Pulmonary:     Effort: Pulmonary effort is normal. No respiratory distress.       Breath sounds: Wheezing and rhonchi present.  Abdominal:     General: Bowel sounds are normal. There is no distension.     Palpations: Abdomen is soft.     Tenderness: There is no abdominal tenderness.  Musculoskeletal:        General: Tenderness present.      Cervical back: Normal range of motion and neck supple.     Comments: Pain in lumbar with flexion and extension  Skin:    General: Skin is warm and dry.  Neurological:     Mental Status: She is alert and oriented to person, place, and time.     Cranial Nerves: No cranial nerve deficit.     Deep Tendon Reflexes: Reflexes are normal and symmetric.  Psychiatric:        Behavior: Behavior normal.        Thought Content: Thought content normal.        Judgment: Judgment normal.      BP 118/72   Pulse 76   Temp 97.8 F (36.6 C) (Temporal)   Ht 5' (1.524 m)   Wt 100 lb (45.4 kg)   SpO2 94%   BMI 19.53 kg/m      Assessment & Plan:  Avis B Fulghum comes in today with chief complaint of Medical Management of Chronic Issues   Diagnosis and orders addressed:  1. Essential hypertension - losartan (COZAAR) 100 MG tablet; Take 1 tablet (100 mg total) by mouth daily.  Dispense: 90 tablet; Refill: 1 - amLODipine (NORVASC) 5 MG tablet; Take 1 tablet (5 mg total) by mouth daily.  Dispense: 90 tablet; Refill: 0 - CMP14+EGFR  2. Lumbar radiculopathy, chronic - gabapentin (NEURONTIN) 100 MG capsule; Take 1 capsule (100 mg total) by mouth 3 (three) times daily.  Dispense: 90 capsule; Refill: 3 - CMP14+EGFR  3. DM type 2 causing vascular disease (HCC) - insulin glargine (LANTUS) 100 UNIT/ML Solostar Pen; Inject 25 Units into the skin daily with breakfast.  Dispense: 30 mL; Refill: 2 - Bayer DCA Hb A1c Waived - CMP14+EGFR  4. Moderate COPD (chronic obstructive pulmonary disease) (HCC) - tiotropium (SPIRIVA HANDIHALER) 18 MCG inhalation capsule; Place 1 capsule (18 mcg total) into inhaler and inhale daily.  Dispense: 30 capsule; Refill: 12 - CMP14+EGFR  5. Essential hypertension, benign  - CMP14+EGFR  6. COPD with acute exacerbation (HCC) - CMP14+EGFR  7. Gastroesophageal reflux disease without esophagitis - CMP14+EGFR  8. Hyperlipidemia associated with type 2 diabetes mellitus  (HCC) - CMP14+EGFR  9. Diabetes mellitus due to underlying condition, uncontrolled, with hyperglycemia, without long-term current use of insulin (HCC) - CMP14+EGFR  10. Bilateral hearing loss, unspecified hearing loss type - CMP14+EGFR  11. Chronic bilateral low back pain with right-sided sciatica Will increase gabapentin to TID from BID Add baclofen as needed - gabapentin (NEURONTIN) 100 MG capsule; Take 1 capsule (100 mg total) by mouth 3 (three) times daily.  Dispense: 90 capsule; Refill: 3 - CMP14+EGFR - Baclofen 5 MG TABS; Take 5 mg by mouth in the morning and at bedtime.  Dispense: 180 tablet; Refill: 2   Labs pending Health Maintenance reviewed Diet and exercise encouraged  Follow up plan: 3 months     , FNP    

## 2021-04-12 LAB — CMP14+EGFR
ALT: 10 IU/L (ref 0–32)
AST: 10 IU/L (ref 0–40)
Albumin/Globulin Ratio: 1.6 (ref 1.2–2.2)
Albumin: 4 g/dL (ref 3.8–4.8)
Alkaline Phosphatase: 107 IU/L (ref 44–121)
BUN/Creatinine Ratio: 18 (ref 12–28)
BUN: 19 mg/dL (ref 8–27)
Bilirubin Total: <0.2 mg/dL (ref 0.0–1.2)
CO2: 23 mmol/L (ref 20–29)
Calcium: 9.2 mg/dL (ref 8.7–10.3)
Chloride: 102 mmol/L (ref 96–106)
Creatinine, Ser: 1.07 mg/dL — ABNORMAL HIGH (ref 0.57–1.00)
Globulin, Total: 2.5 g/dL (ref 1.5–4.5)
Glucose: 318 mg/dL — ABNORMAL HIGH (ref 65–99)
Potassium: 4.9 mmol/L (ref 3.5–5.2)
Sodium: 140 mmol/L (ref 134–144)
Total Protein: 6.5 g/dL (ref 6.0–8.5)
eGFR: 57 mL/min/{1.73_m2} — ABNORMAL LOW (ref >59)

## 2021-04-19 ENCOUNTER — Telehealth: Payer: Self-pay | Admitting: Family

## 2021-04-19 NOTE — Telephone Encounter (Signed)
Patient aware and verbalized understanding. °

## 2021-05-04 ENCOUNTER — Other Ambulatory Visit: Payer: Self-pay | Admitting: Family Medicine

## 2021-05-04 DIAGNOSIS — Z1211 Encounter for screening for malignant neoplasm of colon: Secondary | ICD-10-CM

## 2021-05-04 NOTE — Progress Notes (Signed)
Order for cologuard

## 2021-05-10 ENCOUNTER — Encounter: Payer: Self-pay | Admitting: Family

## 2021-05-10 ENCOUNTER — Ambulatory Visit (INDEPENDENT_AMBULATORY_CARE_PROVIDER_SITE_OTHER): Payer: Medicare HMO

## 2021-05-10 ENCOUNTER — Other Ambulatory Visit: Payer: Self-pay

## 2021-05-10 ENCOUNTER — Ambulatory Visit (INDEPENDENT_AMBULATORY_CARE_PROVIDER_SITE_OTHER): Payer: Medicare HMO | Admitting: Family

## 2021-05-10 VITALS — BP 166/93 | HR 84 | Temp 97.2°F | Ht 60.0 in | Wt 100.8 lb

## 2021-05-10 DIAGNOSIS — M25532 Pain in left wrist: Secondary | ICD-10-CM

## 2021-05-10 DIAGNOSIS — M4005 Postural kyphosis, thoracolumbar region: Secondary | ICD-10-CM

## 2021-05-10 DIAGNOSIS — M5441 Lumbago with sciatica, right side: Secondary | ICD-10-CM

## 2021-05-10 DIAGNOSIS — G8929 Other chronic pain: Secondary | ICD-10-CM | POA: Diagnosis not present

## 2021-05-10 DIAGNOSIS — Z716 Tobacco abuse counseling: Secondary | ICD-10-CM

## 2021-05-10 DIAGNOSIS — M5416 Radiculopathy, lumbar region: Secondary | ICD-10-CM

## 2021-05-10 DIAGNOSIS — M7989 Other specified soft tissue disorders: Secondary | ICD-10-CM | POA: Diagnosis not present

## 2021-05-10 DIAGNOSIS — M545 Low back pain, unspecified: Secondary | ICD-10-CM | POA: Diagnosis not present

## 2021-05-10 NOTE — Progress Notes (Signed)
Subjective:    Patient ID: Jamie [name], female    DOB: 1951/04/28, 70 y.o.   MRN: [id number]  Chief Complaint  Patient presents with   Referral    Wants referral from [facility]    Pt presents to the office today with chronic back pain. She fell two weeks on a step and her pain has worsens and goes down her left leg.   She reports she was walking down the steps and fell forward and landed on her knees and wrist.   She had surgical decompression and stabilization from L2-L4 on 02/05/17. Back Pain This is a chronic problem. The current episode started more than 1 year ago. The problem occurs constantly. The pain is present in the lumbar spine. The quality of the pain is described as aching. The pain is at a severity of 10/10. The pain is moderate. The symptoms are aggravated by standing and twisting. Associated symptoms include leg pain, weakness and weight loss. She has tried muscle relaxant (gabapentin) for the symptoms. The treatment provided moderate relief.  Wrist Pain  The pain is present in the left wrist. This is a new problem. The current episode started 1 to 4 weeks ago. There has been no history of extremity trauma. The problem occurs constantly. The quality of the pain is described as aching. The pain is at a severity of 5/10. The pain is moderate. She has tried rest for the symptoms. The treatment provided mild relief.     Review of Systems  Constitutional:  Positive for weight loss.  Musculoskeletal:  Positive for back pain.  Neurological:  Positive for weakness.  All other systems reviewed and are negative.     Objective:   Physical Exam Vitals reviewed.  Constitutional:      General: She is not in acute distress.    Appearance: She is well-developed.  HENT:     Head: Normocephalic and atraumatic.     Right Ear: Tympanic membrane normal.     Left Ear: Tympanic membrane normal.  Eyes:     Pupils: Pupils are equal, round, and reactive to light.  Neck:     Thyroid:  No thyromegaly.  Cardiovascular:     Rate and Rhythm: Normal rate and regular rhythm.     Heart sounds: Normal heart sounds. No murmur heard. Pulmonary:     Effort: Pulmonary effort is normal. No respiratory distress.     Breath sounds: Normal breath sounds. No wheezing.  Abdominal:     General: Bowel sounds are normal. There is no distension.     Palpations: Abdomen is soft.     Tenderness: There is no abdominal tenderness.  Musculoskeletal:        General: Tenderness (left medial wrist) present.     Cervical back: Normal range of motion and neck supple.     Comments: Pain in lumbar with flexion and extension, unable to fully extend her back while standing.   Skin:    General: Skin is warm and dry.  Neurological:     Mental Status: She is alert and oriented to person, place, and time.     Cranial Nerves: No cranial nerve deficit.     Deep Tendon Reflexes: Reflexes are normal and symmetric.  Psychiatric:        Behavior: Behavior normal.        Thought Content: Thought content normal.        Judgment: Judgment normal.     BP (!) 166/93   Pulse  84   Temp (!) 97.2 F (36.2 C) (Temporal)   Ht 5' (1.524 m)   Wt 100 lb 12.8 oz (45.7 kg)   BMI 19.69 kg/m       Assessment & Plan:  CAETLIN BELGER comes in today with chief complaint of Referral (Wants referral from [facility] )   Diagnosis and orders addressed:  1. Left wrist pain - DG Wrist Complete Left; Future  2. Lumbar radiculopathy, chronic - Ambulatory referral to Neurosurgery - DG Lumbar Spine 2-3 Views; Future  3. Postural kyphosis of lumbar region - Ambulatory referral to Neurosurgery - DG Lumbar Spine 2-3 Views; Future  4. Chronic bilateral low back pain with right-sided sciatica - Ambulatory referral to Neurosurgery - DG Lumbar Spine 2-3 Views; Future  5. Tobacco abuse counseling  X-ray pending Continue gabapentin and baclofen    Evelina Dun, FNP

## 2021-05-10 NOTE — Patient Instructions (Signed)
https://[email or web address]">  Chronic Back Pain When back pain lasts longer than 3 months, it is called chronic back pain. The cause of your back pain may not be known. Some common causes include: Wear and tear (degenerative disease) of the bones, ligaments, or disks in your back. Inflammation and stiffness in your back (arthritis). People who have chronic back pain often go through certain periods in which the pain is more intense (flare-ups). Many people can learn to manage the pain with home care. Follow these instructions at home: Pay attention to any changes in your symptoms. Take these actions to help withyour pain: Managing pain and stiffness     If directed, apply ice to the painful area. Your health care provider may recommend applying ice during the first 24-48 hours after a flare-up begins. To do this: Put ice in a plastic bag. Place a towel between your skin and the bag. Leave the ice on for 20 minutes, 2-3 times per day. If directed, apply heat to the affected area as often as told by your health care provider. Use the heat source that your health care provider recommends, such as a moist heat pack or a heating pad. Place a towel between your skin and the heat source. Leave the heat on for 20-30 minutes. Remove the heat if your skin turns bright red. This is especially important if you are unable to feel pain, heat, or cold. You may have a greater risk of getting burned. Try soaking in a warm tub. Activity  Avoid bending and other activities that make the problem worse. Maintain a proper position when standing or sitting: When standing, keep your upper back and neck straight, with your shoulders pulled back. Avoid slouching. When sitting, keep your back straight and relax your shoulders. Do not round your shoulders or pull them backward. Do not sit or stand in one place for long periods of time. Take brief periods of rest throughout the day. This will reduce your  pain. Resting in a lying or standing position is usually better than sitting to rest. When you are resting for longer periods, mix in some mild activity or stretching between periods of rest. This will help to prevent stiffness and pain. Get regular exercise. Ask your health care provider what activities are safe for you. Do not lift anything that is heavier than 10 lb (4.5 kg), or the limit that you are told, until your health care provider says that it is safe. Always use proper lifting technique, which includes: Bending your knees. Keeping the load close to your body. Avoiding twisting. Sleep on a firm mattress in a comfortable position. Try lying on your side with your knees slightly bent. If you lie on your back, put a pillow under your knees.  Medicines Treatment may include medicines for pain and inflammation taken by mouth or applied to the skin, prescription pain medicine, or muscle relaxants. Take over-the-counter and prescription medicines only as told by your health care provider. Ask your health care provider if the medicine prescribed to you: Requires you to avoid driving or using machinery. Can cause constipation. You may need to take these actions to prevent or treat constipation: Drink enough fluid to keep your urine pale yellow. Take over-the-counter or prescription medicines. Eat foods that are high in fiber, such as beans, whole grains, and fresh fruits and vegetables. Limit foods that are high in fat and processed sugars, such as fried or sweet foods. General instructions Do not use any products that contain   nicotine or tobacco, such as cigarettes, e-cigarettes, and chewing tobacco. If you need help quitting, ask your health care provider. Keep all follow-up visits as told by your health care provider. This is important. Contact a health care provider if: You have pain that is not relieved with rest or medicine. Your pain gets worse, or you have new pain. You have a high  fever. You have rapid weight loss. You have trouble doing your normal activities. Get help right away if: You have weakness or numbness in one or both of your legs or feet. You have trouble controlling your bladder or your bowels. You have severe back pain and have any of the following: Nausea or vomiting. Pain in your abdomen. Shortness of breath or you faint. Summary Chronic back pain is back pain that lasts longer than 3 months. When a flare-up begins, apply ice to the painful area for the first 24-48 hours. Apply a moist heat pad or use a heating pad on the painful area as directed by your health care provider. When you are resting for longer periods, mix in some mild activity or stretching between periods of rest. This will help to prevent stiffness and pain. This information is not intended to replace advice given to you by your health care provider. Make sure you discuss any questions you have with your healthcare provider. Document Revised: 11/10/2019 Document Reviewed: 11/10/2019 Elsevier Patient Education  2022 [employer]  

## 2021-05-14 ENCOUNTER — Telehealth: Payer: Self-pay | Admitting: Family

## 2021-05-14 NOTE — Telephone Encounter (Signed)
Pt wants someone to call her with xray results.

## 2021-05-14 NOTE — Telephone Encounter (Signed)
See result note.  

## 2021-05-14 NOTE — Telephone Encounter (Signed)
Please review x-rays.

## 2021-05-15 ENCOUNTER — Other Ambulatory Visit: Payer: Self-pay | Admitting: Family

## 2021-05-15 DIAGNOSIS — T148XXA Other injury of unspecified body region, initial encounter: Secondary | ICD-10-CM

## 2021-05-17 ENCOUNTER — Telehealth: Payer: Self-pay | Admitting: Family

## 2021-05-17 NOTE — Telephone Encounter (Signed)
This referral was placed on 05/10/21 and is waiting to be scheduled.

## 2021-05-17 NOTE — Telephone Encounter (Signed)
Pt returned missed call regarding xray results. Reviewed results with patient. Patient voiced understanding. Wants to know what the next steps for her back is because she wants to get the metal out of her back because it hurts so much. Says she called the back doctor about it and they told her that Alyse Low would have to send her to see them since its been more than a year since she was last seen.  Please advise and call patient back tomorrow morning that way we can talk to her husband because he can hear better than she can.

## 2021-05-17 NOTE — Telephone Encounter (Signed)
PER PATIENT CALL 08/05

## 2021-06-29 DIAGNOSIS — M25532 Pain in left wrist: Secondary | ICD-10-CM | POA: Insufficient documentation

## 2021-06-29 DIAGNOSIS — M654 Radial styloid tenosynovitis [de Quervain]: Secondary | ICD-10-CM | POA: Diagnosis not present

## 2021-07-10 ENCOUNTER — Ambulatory Visit (INDEPENDENT_AMBULATORY_CARE_PROVIDER_SITE_OTHER): Payer: Medicare HMO

## 2021-07-10 ENCOUNTER — Other Ambulatory Visit: Payer: Self-pay

## 2021-07-10 DIAGNOSIS — Z23 Encounter for immunization: Secondary | ICD-10-CM

## 2021-07-13 DIAGNOSIS — M5416 Radiculopathy, lumbar region: Secondary | ICD-10-CM | POA: Diagnosis not present

## 2021-07-18 ENCOUNTER — Other Ambulatory Visit: Payer: Self-pay | Admitting: *Deleted

## 2021-07-18 DIAGNOSIS — E1165 Type 2 diabetes mellitus with hyperglycemia: Secondary | ICD-10-CM

## 2021-07-18 MED ORDER — ACCU-CHEK SOFTCLIX LANCETS MISC: 3 refills | Status: DC

## 2021-07-18 MED ORDER — ACCU-CHEK GUIDE VI STRP: ORAL_STRIP | 3 refills | Status: DC

## 2021-07-23 DIAGNOSIS — M5116 Intervertebral disc disorders with radiculopathy, lumbar region: Secondary | ICD-10-CM | POA: Diagnosis not present

## 2021-07-23 DIAGNOSIS — M5416 Radiculopathy, lumbar region: Secondary | ICD-10-CM | POA: Diagnosis not present

## 2021-09-26 ENCOUNTER — Ambulatory Visit (INDEPENDENT_AMBULATORY_CARE_PROVIDER_SITE_OTHER): Payer: Medicare HMO | Admitting: Family

## 2021-09-26 ENCOUNTER — Encounter: Payer: Self-pay | Admitting: Family

## 2021-09-26 VITALS — BP 131/70 | HR 87 | Temp 97.4°F | Ht 60.0 in | Wt 96.0 lb

## 2021-09-26 DIAGNOSIS — J449 Chronic obstructive pulmonary disease, unspecified: Secondary | ICD-10-CM

## 2021-09-26 DIAGNOSIS — H9193 Unspecified hearing loss, bilateral: Secondary | ICD-10-CM | POA: Diagnosis not present

## 2021-09-26 DIAGNOSIS — I2584 Coronary atherosclerosis due to calcified coronary lesion: Secondary | ICD-10-CM

## 2021-09-26 DIAGNOSIS — K219 Gastro-esophageal reflux disease without esophagitis: Secondary | ICD-10-CM

## 2021-09-26 DIAGNOSIS — E1159 Type 2 diabetes mellitus with other circulatory complications: Secondary | ICD-10-CM

## 2021-09-26 DIAGNOSIS — Z716 Tobacco abuse counseling: Secondary | ICD-10-CM

## 2021-09-26 DIAGNOSIS — M5416 Radiculopathy, lumbar region: Secondary | ICD-10-CM

## 2021-09-26 DIAGNOSIS — M5441 Lumbago with sciatica, right side: Secondary | ICD-10-CM

## 2021-09-26 DIAGNOSIS — I2510 Atherosclerotic heart disease of native coronary artery without angina pectoris: Secondary | ICD-10-CM | POA: Diagnosis not present

## 2021-09-26 DIAGNOSIS — E559 Vitamin D deficiency, unspecified: Secondary | ICD-10-CM

## 2021-09-26 DIAGNOSIS — I10 Essential (primary) hypertension: Secondary | ICD-10-CM | POA: Diagnosis not present

## 2021-09-26 DIAGNOSIS — E1169 Type 2 diabetes mellitus with other specified complication: Secondary | ICD-10-CM

## 2021-09-26 DIAGNOSIS — Z1211 Encounter for screening for malignant neoplasm of colon: Secondary | ICD-10-CM

## 2021-09-26 DIAGNOSIS — E785 Hyperlipidemia, unspecified: Secondary | ICD-10-CM

## 2021-09-26 DIAGNOSIS — F17200 Nicotine dependence, unspecified, uncomplicated: Secondary | ICD-10-CM

## 2021-09-26 DIAGNOSIS — E0865 Diabetes mellitus due to underlying condition with hyperglycemia: Secondary | ICD-10-CM | POA: Diagnosis not present

## 2021-09-26 DIAGNOSIS — G8929 Other chronic pain: Secondary | ICD-10-CM

## 2021-09-26 LAB — BAYER DCA HB A1C WAIVED: HB A1C (BAYER DCA - WAIVED): 9.9 % — ABNORMAL HIGH (ref 4.8–5.6)

## 2021-09-26 LAB — CBC WITH DIFFERENTIAL/PLATELET
Basophils Absolute: 0 10*3/uL (ref 0.0–0.2)
Basos: 0 %
EOS (ABSOLUTE): 0.1 10*3/uL (ref 0.0–0.4)
Eos: 1 %
Hematocrit: 40.1 % (ref 34.0–46.6)
Hemoglobin: 13.1 g/dL (ref 11.1–15.9)
Immature Grans (Abs): 0.1 10*3/uL (ref 0.0–0.1)
Immature Granulocytes: 1 %
Lymphocytes Absolute: 1.5 10*3/uL (ref 0.7–3.1)
Lymphs: 18 %
MCH: 27.5 pg (ref 26.6–33.0)
MCHC: 32.7 g/dL (ref 31.5–35.7)
MCV: 84 fL (ref 79–97)
Monocytes Absolute: 0.5 10*3/uL (ref 0.1–0.9)
Monocytes: 6 %
Neutrophils Absolute: 6.1 10*3/uL (ref 1.4–7.0)
Neutrophils: 74 %
Platelets: 231 10*3/uL (ref 150–450)
RBC: 4.76 x10E6/uL (ref 3.77–5.28)
RDW: 13.1 % (ref 11.7–15.4)
WBC: 8.3 10*3/uL (ref 3.4–10.8)

## 2021-09-26 LAB — CMP14+EGFR
ALT: 10 IU/L (ref 0–32)
AST: 9 IU/L (ref 0–40)
Albumin/Globulin Ratio: 1.5 (ref 1.2–2.2)
Albumin: 3.7 g/dL — ABNORMAL LOW (ref 3.8–4.8)
Alkaline Phosphatase: 96 IU/L (ref 44–121)
BUN/Creatinine Ratio: 15 (ref 12–28)
BUN: 16 mg/dL (ref 8–27)
Bilirubin Total: 0.3 mg/dL (ref 0.0–1.2)
CO2: 22 mmol/L (ref 20–29)
Calcium: 8.8 mg/dL (ref 8.7–10.3)
Chloride: 99 mmol/L (ref 96–106)
Creatinine, Ser: 1.06 mg/dL — ABNORMAL HIGH (ref 0.57–1.00)
Globulin, Total: 2.5 g/dL (ref 1.5–4.5)
Glucose: 382 mg/dL — ABNORMAL HIGH (ref 70–99)
Potassium: 4.3 mmol/L (ref 3.5–5.2)
Sodium: 137 mmol/L (ref 134–144)
Total Protein: 6.2 g/dL (ref 6.0–8.5)
eGFR: 57 mL/min/{1.73_m2} — ABNORMAL LOW (ref >59)

## 2021-09-26 NOTE — Progress Notes (Signed)
Subjective:    Patient ID: Jamie [name], female    DOB: Feb 14, 1951, 70 y.o.   MRN: [id number]  Chief Complaint  Patient presents with   Medical Management of Chronic Issues   PT presents to the office today for chronic follow up.  PT had surgical decompression and stabilization from L2-L4 on 02/05/17. Pt states her back continues to hurt and her pain is a 10 out 10 when she walks or standing.   Her A1C is improved today. She is followed by  Endocrinologists.   She has CAD and takes statin daily.  Hypertension This is a chronic problem. The current episode started more than 1 year ago. The problem has been waxing and waning since onset. The problem is uncontrolled. Associated symptoms include malaise/fatigue and shortness of breath. Pertinent negatives include no blurred vision or peripheral edema. Risk factors for coronary artery disease include dyslipidemia and diabetes mellitus. The current treatment provides moderate improvement.  Diabetes She presents for her follow-up diabetic visit. She has type 2 diabetes mellitus. Pertinent negatives for diabetes include no blurred vision and no foot paresthesias. Symptoms are stable. Diabetic complications include nephropathy. Pertinent negatives for diabetic complications include no peripheral neuropathy. Risk factors for coronary artery disease include dyslipidemia, diabetes mellitus, hypertension, sedentary lifestyle and post-menopausal. She is following a generally unhealthy diet. Her overall blood glucose range is 110-130 mg/dl. An ACE inhibitor/angiotensin II receptor blocker is being taken.  Gastroesophageal Reflux She complains of belching and heartburn. This is a chronic problem. The current episode started more than 1 year ago. The problem occurs occasionally. The problem has been waxing and waning. She has tried a PPI for the symptoms. The treatment provided moderate relief.  Hyperlipidemia This is a chronic problem. The current episode  started more than 1 year ago. Associated symptoms include shortness of breath. Current antihyperlipidemic treatment includes statins. The current treatment provides moderate improvement of lipids. Risk factors for coronary artery disease include dyslipidemia, diabetes mellitus, hypertension, a sedentary lifestyle and post-menopausal.  Nicotine Dependence Presents for follow-up visit. Her urge triggers include company of smokers. The symptoms have been stable. She smokes 1 pack of cigarettes per day.     Review of Systems  Constitutional:  Positive for malaise/fatigue.  Eyes:  Negative for blurred vision.  Respiratory:  Positive for shortness of breath.   Gastrointestinal:  Positive for heartburn.  All other systems reviewed and are negative.     Objective:   Physical Exam Vitals reviewed.  Constitutional:      General: She is not in acute distress.    Appearance: She is well-developed.  HENT:     Head: Normocephalic and atraumatic.     Right Ear: Tympanic membrane normal.     Left Ear: Tympanic membrane normal.  Eyes:     Pupils: Pupils are equal, round, and reactive to light.  Neck:     Thyroid: No thyromegaly.  Cardiovascular:     Rate and Rhythm: Normal rate and regular rhythm.     Heart sounds: Normal heart sounds. No murmur heard. Pulmonary:     Effort: Pulmonary effort is normal. No respiratory distress.     Breath sounds: Normal breath sounds. No wheezing.  Abdominal:     General: Bowel sounds are normal. There is no distension.     Palpations: Abdomen is soft.     Tenderness: There is no abdominal tenderness.  Musculoskeletal:        General: No tenderness. Normal range of motion.  Cervical back: Normal range of motion and neck supple.  Skin:    General: Skin is warm and dry.  Neurological:     Mental Status: She is alert and oriented to person, place, and time.     Cranial Nerves: No cranial nerve deficit.     Deep Tendon Reflexes: Reflexes are normal and  symmetric.  Psychiatric:        Behavior: Behavior normal.        Thought Content: Thought content normal.        Judgment: Judgment normal.      BP 131/70    Pulse 87    Temp (!) 97.4 F (36.3 C) (Temporal)    Ht 5' (1.524 m)    Wt 96 lb (43.5 kg)    SpO2 96%    BMI 18.75 kg/m      Assessment & Plan:  ERLA BACCHI comes in today with chief complaint of Medical Management of Chronic Issues   Diagnosis and orders addressed:  1. DM type 2 causing vascular disease ([place]) - CMP14+EGFR - CBC with Differential/Platelet - Bayer DCA Hb A1c Waived  2. Essential hypertension, benign - CMP14+EGFR - CBC with Differential/Platelet  3. Coronary artery calcification - CMP14+EGFR - CBC with Differential/Platelet  4. Moderate COPD (chronic obstructive pulmonary disease) (HCC) - CMP14+EGFR - CBC with Differential/Platelet  5. Gastroesophageal reflux disease without esophagitis - CMP14+EGFR - CBC with Differential/Platelet  6. Diabetes mellitus due to underlying condition, uncontrolled, with hyperglycemia, without long-term current use of insulin (HCC) - CMP14+EGFR - CBC with Differential/Platelet  7. Hyperlipidemia associated with type 2 diabetes mellitus (HCC) - CMP14+EGFR - CBC with Differential/Platelet  8. Lumbar radiculopathy, chronic - CMP14+EGFR - CBC with Differential/Platelet  9. Bilateral hearing loss, unspecified hearing loss type  - CMP14+EGFR - CBC with Differential/Platelet  10. Chronic bilateral low back pain with right-sided sciatica - CMP14+EGFR - CBC with Differential/Platelet  11. Vitamin D deficiency  - CMP14+EGFR - CBC with Differential/Platelet  12. Current smoker  - CMP14+EGFR - CBC with Differential/Platelet  13. Tobacco abuse counseling  - CMP14+EGFR - CBC with Differential/Platelet  14. Colon cancer screening  - Cologuard   Labs pending Health Maintenance reviewed Diet and exercise encouraged  Follow up plan: 3 months     Evelina Dun, FNP

## 2021-09-26 NOTE — Patient Instructions (Signed)
Health Maintenance After Age 69 After age 69, you are at a higher risk for certain long-term diseases and infections as well as injuries from falls. Falls are a major cause of broken bones and head injuries in people who are older than age 69. Getting regular preventive care can help to keep you healthy and well. Preventive care includes getting regular testing and making lifestyle changes as recommended by your health care provider. Talk with your health care provider about: Which screenings and tests you should have. A screening is a test that checks for a disease when you have no symptoms. A diet and exercise plan that is right for you. What should I know about screenings and tests to prevent falls? Screening and testing are the best ways to find a health problem early. Early diagnosis and treatment give you the best chance of managing medical conditions that are common after age 69. Certain conditions and lifestyle choices may make you more likely to have a fall. Your health care provider may recommend: Regular vision checks. Poor vision and conditions such as cataracts can make you more likely to have a fall. If you wear glasses, make sure to get your prescription updated if your vision changes. Medicine review. Work with your health care provider to regularly review all of the medicines you are taking, including over-the-counter medicines. Ask your health care provider about any side effects that may make you more likely to have a fall. Tell your health care provider if any medicines that you take make you feel dizzy or sleepy. Strength and balance checks. Your health care provider may recommend certain tests to check your strength and balance while standing, walking, or changing positions. Foot health exam. Foot pain and numbness, as well as not wearing proper footwear, can make you more likely to have a fall. Screenings, including: Osteoporosis screening. Osteoporosis is a condition that causes  the bones to get weaker and break more easily. Blood pressure screening. Blood pressure changes and medicines to control blood pressure can make you feel dizzy. Depression screening. You may be more likely to have a fall if you have a fear of falling, feel depressed, or feel unable to do activities that you used to do. Alcohol use screening. Using too much alcohol can affect your balance and may make you more likely to have a fall. Follow these instructions at home: Lifestyle Do not drink alcohol if: Your health care provider tells you not to drink. If you drink alcohol: Limit how much you have to: 0-1 drink a day for women. 0-2 drinks a day for men. Know how much alcohol is in your drink. In the [place] one drink equals one 12 oz bottle of beer (355 mL), one 5 oz glass of wine (148 mL), or one 1 oz glass of hard liquor (44 mL). Do not use any products that contain nicotine or tobacco. These products include cigarettes, chewing tobacco, and vaping devices, such as e-cigarettes. If you need help quitting, ask your health care provider. Activity  Follow a regular exercise program to stay fit. This will help you maintain your balance. Ask your health care provider what types of exercise are appropriate for you. If you need a cane or walker, use it as recommended by your health care provider. Wear supportive shoes that have nonskid soles. Safety  Remove any tripping hazards, such as rugs, cords, and clutter. Install safety equipment such as grab bars in bathrooms and safety rails on stairs. Keep rooms and walkways   well-lit. General instructions Talk with your health care provider about your risks for falling. Tell your health care provider if: You fall. Be sure to tell your health care provider about all falls, even ones that seem minor. You feel dizzy, tiredness (fatigue), or off-balance. Take over-the-counter and prescription medicines only as told by your health care provider. These include  supplements. Eat a healthy diet and maintain a healthy weight. A healthy diet includes low-fat dairy products, low-fat (lean) meats, and fiber from whole grains, beans, and lots of fruits and vegetables. Stay current with your vaccines. Schedule regular health, dental, and eye exams. Summary Having a healthy lifestyle and getting preventive care can help to protect your health and wellness after age 69. Screening and testing are the best way to find a health problem early and help you avoid having a fall. Early diagnosis and treatment give you the best chance for managing medical conditions that are more common for people who are older than age 69. Falls are a major cause of broken bones and head injuries in people who are older than age 69. Take precautions to prevent a fall at home. Work with your health care provider to learn what changes you can make to improve your health and wellness and to prevent falls. This information is not intended to replace advice given to you by your health care provider. Make sure you discuss any questions you have with your health care provider. Document Revised: 02/19/2021 Document Reviewed: 02/19/2021 Elsevier Patient Education  2022 [employer] Inc.  

## 2021-09-27 ENCOUNTER — Other Ambulatory Visit: Payer: Self-pay | Admitting: Family

## 2021-09-27 DIAGNOSIS — E1159 Type 2 diabetes mellitus with other circulatory complications: Secondary | ICD-10-CM

## 2021-09-27 MED ORDER — INSULIN GLARGINE 100 UNIT/ML SOLOSTAR PEN: 28.0000 [IU] | PEN_INJECTOR | Freq: Every day | SUBCUTANEOUS | 2 refills | Status: DC

## 2021-10-03 ENCOUNTER — Other Ambulatory Visit: Payer: Self-pay | Admitting: *Deleted

## 2021-10-03 MED ORDER — DROPLET PEN NEEDLES 32G X 5 MM MISC: 3 refills | Status: DC

## 2021-12-12 ENCOUNTER — Ambulatory Visit (INDEPENDENT_AMBULATORY_CARE_PROVIDER_SITE_OTHER): Payer: Medicare HMO

## 2021-12-12 VITALS — Wt 96.0 lb

## 2021-12-12 DIAGNOSIS — Z780 Asymptomatic menopausal state: Secondary | ICD-10-CM | POA: Diagnosis not present

## 2021-12-12 DIAGNOSIS — Z1211 Encounter for screening for malignant neoplasm of colon: Secondary | ICD-10-CM | POA: Diagnosis not present

## 2021-12-12 DIAGNOSIS — Z1231 Encounter for screening mammogram for malignant neoplasm of breast: Secondary | ICD-10-CM

## 2021-12-12 DIAGNOSIS — Z0000 Encounter for general adult medical examination without abnormal findings: Secondary | ICD-10-CM | POA: Diagnosis not present

## 2021-12-12 NOTE — Patient Instructions (Addendum)
Jamie [name] , Thank you for taking time to come for your Medicare Wellness Visit. I appreciate your ongoing commitment to your health goals. Please review the following plan we discussed and let me know if I can assist you in the future.   Screening recommendations/referrals: Colonoscopy: Cologuard at home test done 03/04/2018 - we recommend you repeat this every 3 years - I ordered you another kit today Mammogram: Done 02/04/2018 - Repeat annually *this is past due - please call us to make an appointment soon Bone Density: Done 09/27/2016 - we recommend you repeat this every 2 years - you can do this at your next visit with [facility] Recommended yearly ophthalmology/optometry visit for glaucoma screening and checkup Recommended yearly dental visit for hygiene and checkup  Vaccinations: Influenza vaccine: Done 07/10/2021 - Repeat annually Pneumococcal vaccine: Done 02/13/2015 & 01/29/2018 Tdap vaccine: Due - we recommend these every 10 years or if you are cut with metal Shingles vaccine: Due - we recommend 2 doses 2-6 months apart per lifetime to be protected over 90% from Shingles   Covid-19:Done 12/09/2019, 01/07/2020, 10/11/2020, & 08/14/2021  Advanced directives: Advance directive discussed with you today. Even though you declined this today, please call our office should you change your mind, and we can give you the proper paperwork for you to fill out.   Conditions/risks identified: Aim for 30 minutes of exercise, 6-8 glasses of water, and 5 servings of fruits and vegetables each day. Try the back exercises at the end of this summary - if they are painful, stop and try a different approach - discuss physical therapy with Christy.  Next appointment: Follow up in one year for your annual wellness visit    Preventive Care 65 Years and Older, Female Preventive care refers to lifestyle choices and visits with your health care provider that can promote health and wellness. What does preventive care  include? A yearly physical exam. This is also called an annual well check. Dental exams once or twice a year. Routine eye exams. Ask your health care provider how often you should have your eyes checked. Personal lifestyle choices, including: Daily care of your teeth and gums. Regular physical activity. Eating a healthy diet. Avoiding tobacco and drug use. Limiting alcohol use. Practicing safe sex. Taking low-dose aspirin every day. Taking vitamin and mineral supplements as recommended by your health care provider. What happens during an annual well check? The services and screenings done by your health care provider during your annual well check will depend on your age, overall health, lifestyle risk factors, and family history of disease. Counseling  Your health care provider may ask you questions about your: Alcohol use. Tobacco use. Drug use. Emotional well-being. Home and relationship well-being. Sexual activity. Eating habits. History of falls. Memory and ability to understand (cognition). Work and work Statistician. Reproductive health. Screening  You may have the following tests or measurements: Height, weight, and BMI. Blood pressure. Lipid and cholesterol levels. These may be checked every 5 years, or more frequently if you are over 91 years old. Skin check. Lung cancer screening. You may have this screening every year starting at age 24 if you have a 30-pack-year history of smoking and currently smoke or have quit within the past 15 years. Fecal occult blood test (FOBT) of the stool. You may have this test every year starting at age 2. Flexible sigmoidoscopy or colonoscopy. You may have a sigmoidoscopy every 5 years or a colonoscopy every 10 years starting at age 60. Hepatitis  C blood test. Hepatitis B blood test. Sexually transmitted disease (STD) testing. Diabetes screening. This is done by checking your blood sugar (glucose) after you have not eaten for a while  (fasting). You may have this done every 1-3 years. Bone density scan. This is done to screen for osteoporosis. You may have this done starting at age 25. Mammogram. This may be done every 1-2 years. Talk to your health care provider about how often you should have regular mammograms. Talk with your health care provider about your test results, treatment options, and if necessary, the need for more tests. Vaccines  Your health care provider may recommend certain vaccines, such as: Influenza vaccine. This is recommended every year. Tetanus, diphtheria, and acellular pertussis (Tdap, Td) vaccine. You may need a Td booster every 10 years. Zoster vaccine. You may need this after age 27. Pneumococcal 13-valent conjugate (PCV13) vaccine. One dose is recommended after age 68. Pneumococcal polysaccharide (PPSV23) vaccine. One dose is recommended after age 35. Talk to your health care provider about which screenings and vaccines you need and how often you need them. This information is not intended to replace advice given to you by your health care provider. Make sure you discuss any questions you have with your health care provider. Document Released: 10/27/2015 Document Revised: 06/19/2016 Document Reviewed: 08/01/2015 Elsevier Interactive Patient Education  2017 [place] Prevention in the Home Falls can cause injuries. They can happen to people of all ages. There are many things you can do to make your home safe and to help prevent falls. What can I do on the outside of my home? Regularly fix the edges of walkways and driveways and fix any cracks. Remove anything that might make you trip as you walk through a door, such as a raised step or threshold. Trim any bushes or trees on the path to your home. Use bright outdoor lighting. Clear any walking paths of anything that might make someone trip, such as rocks or tools. Regularly check to see if handrails are loose or broken. Make sure that  both sides of any steps have handrails. Any raised decks and porches should have guardrails on the edges. Have any leaves, snow, or ice cleared regularly. Use sand or salt on walking paths during winter. Clean up any spills in your garage right away. This includes oil or grease spills. What can I do in the bathroom? Use night lights. Install grab bars by the toilet and in the tub and shower. Do not use towel bars as grab bars. Use non-skid mats or decals in the tub or shower. If you need to sit down in the shower, use a plastic, non-slip stool. Keep the floor dry. Clean up any water that spills on the floor as soon as it happens. Remove soap buildup in the tub or shower regularly. Attach bath mats securely with double-sided non-slip rug tape. Do not have throw rugs and other things on the floor that can make you trip. What can I do in the bedroom? Use night lights. Make sure that you have a light by your bed that is easy to reach. Do not use any sheets or blankets that are too big for your bed. They should not hang down onto the floor. Have a firm chair that has side arms. You can use this for support while you get dressed. Do not have throw rugs and other things on the floor that can make you trip. What can I do in the  kitchen? Clean up any spills right away. Avoid walking on wet floors. Keep items that you use a lot in easy-to-reach places. If you need to reach something above you, use a strong step stool that has a grab bar. Keep electrical cords out of the way. Do not use floor polish or wax that makes floors slippery. If you must use wax, use non-skid floor wax. Do not have throw rugs and other things on the floor that can make you trip. What can I do with my stairs? Do not leave any items on the stairs. Make sure that there are handrails on both sides of the stairs and use them. Fix handrails that are broken or loose. Make sure that handrails are as long as the stairways. Check  any carpeting to make sure that it is firmly attached to the stairs. Fix any carpet that is loose or worn. Avoid having throw rugs at the top or bottom of the stairs. If you do have throw rugs, attach them to the floor with carpet tape. Make sure that you have a light switch at the top of the stairs and the bottom of the stairs. If you do not have them, ask someone to add them for you. What else can I do to help prevent falls? Wear shoes that: Do not have high heels. Have rubber bottoms. Are comfortable and fit you well. Are closed at the toe. Do not wear sandals. If you use a stepladder: Make sure that it is fully opened. Do not climb a closed stepladder. Make sure that both sides of the stepladder are locked into place. Ask someone to hold it for you, if possible. Clearly mark and make sure that you can see: Any grab bars or handrails. First and last steps. Where the edge of each step is. Use tools that help you move around (mobility aids) if they are needed. These include: Canes. Walkers. Scooters. Crutches. Turn on the lights when you go into a dark area. Replace any light bulbs as soon as they burn out. Set up your furniture so you have a clear path. Avoid moving your furniture around. If any of your floors are uneven, fix them. If there are any pets around you, be aware of where they are. Review your medicines with your doctor. Some medicines can make you feel dizzy. This can increase your chance of falling. Ask your doctor what other things that you can do to help prevent falls. This information is not intended to replace advice given to you by your health care provider. Make sure you discuss any questions you have with your health care provider. Document Released: 07/27/2009 Document Revised: 03/07/2016 Document Reviewed: 11/04/2014 Elsevier Interactive Patient Education  2017 [place].   Back Exercises These exercises help to make your trunk and back strong. They also  help to keep the lower back flexible. Doing these exercises can help to prevent or lessen pain in your lower back. If you have back pain, try to do these exercises 2-3 times each day or as told by your doctor. As you get better, do the exercises once each day. Repeat the exercises more often as told by your doctor. To stop back pain from coming back, do the exercises once each day, or as told by your doctor. Do exercises exactly as told by your doctor. Stop right away if you feel sudden pain or your pain gets worse. Exercises Single knee to chest Do these steps 3-5 times in a row for  each leg: Lie on your back on a firm bed or the floor with your legs stretched out. Bring one knee to your chest. Grab your knee or thigh with both hands and hold it in place. Pull on your knee until you feel a gentle stretch in your lower back or butt. Keep doing the stretch for 10-30 seconds. Slowly let go of your leg and straighten it. Pelvic tilt Do these steps 5-10 times in a row: Lie on your back on a firm bed or the floor with your legs stretched out. Bend your knees so they point up to the ceiling. Your feet should be flat on the floor. Tighten your lower belly (abdomen) muscles to press your lower back against the floor. This will make your tailbone point up to the ceiling instead of pointing down to your feet or the floor. Stay in this position for 5-10 seconds while you gently tighten your muscles and breathe evenly. Cat-cow Do these steps until your lower back bends more easily: Get on your hands and knees on a firm bed or the floor. Keep your hands under your shoulders, and keep your knees under your hips. You may put padding under your knees. Let your head hang down toward your chest. Tighten (contract) the muscles in your belly. Point your tailbone toward the floor so your lower back becomes rounded like the back of a cat. Stay in this position for 5 seconds. Slowly lift your head. Let the muscles  of your belly relax. Point your tailbone up toward the ceiling so your back forms a sagging arch like the back of a cow. Stay in this position for 5 seconds.  Press-ups Do these steps 5-10 times in a row: Lie on your belly (face-down) on a firm bed or the floor. Place your hands near your head, about shoulder-width apart. While you keep your back relaxed and keep your hips on the floor, slowly straighten your arms to raise the top half of your body and lift your shoulders. Do not use your back muscles. You may change where you place your hands to make yourself more comfortable. Stay in this position for 5 seconds. Keep your back relaxed. Slowly return to lying flat on the floor.  Bridges Do these steps 10 times in a row: Lie on your back on a firm bed or the floor. Bend your knees so they point up to the ceiling. Your feet should be flat on the floor. Your arms should be flat at your sides, next to your body. Tighten your butt muscles and lift your butt off the floor until your waist is almost as high as your knees. If you do not feel the muscles working in your butt and the back of your thighs, slide your feet 1-2 inches (2.5-5 cm) farther away from your butt. Stay in this position for 3-5 seconds. Slowly lower your butt to the floor, and let your butt muscles relax. If this exercise is too easy, try doing it with your arms crossed over your chest. Belly crunches Do these steps 5-10 times in a row: Lie on your back on a firm bed or the floor with your legs stretched out. Bend your knees so they point up to the ceiling. Your feet should be flat on the floor. Cross your arms over your chest. Tip your chin a little bit toward your chest, but do not bend your neck. Tighten your belly muscles and slowly raise your chest just enough to lift your  shoulder blades a tiny bit off the floor. Avoid raising your body higher than that because it can put too much stress on your lower back. Slowly lower  your chest and your head to the floor. Back lifts Do these steps 5-10 times in a row: Lie on your belly (face-down) with your arms at your sides, and rest your forehead on the floor. Tighten the muscles in your legs and your butt. Slowly lift your chest off the floor while you keep your hips on the floor. Keep the back of your head in line with the curve in your back. Look at the floor while you do this. Stay in this position for 3-5 seconds. Slowly lower your chest and your face to the floor. Contact a doctor if: Your back pain gets a lot worse when you do an exercise. Your back pain does not get better within 2 hours after you exercise. If you have any of these problems, stop doing the exercises. Do not do them again unless your doctor says it is okay. Get help right away if: You have sudden, very bad back pain. If this happens, stop doing the exercises. Do not do them again unless your doctor says it is okay. This information is not intended to replace advice given to you by your health care provider. Make sure you discuss any questions you have with your health care provider. Document Revised: 12/13/2020 Document Reviewed: 12/13/2020 Elsevier Patient Education  [place].

## 2021-12-12 NOTE — Progress Notes (Addendum)
Subjective:   Jamie [name] is a 71 y.o. female who presents for Medicare Annual (Subsequent) preventive examination.  Virtual Visit via Telephone Note  I connected with  Jamie [name] on 12/12/21 at  3:30 PM EST by telephone and verified that I am speaking with the correct person using two identifiers.  Location: Patient: Home Provider: [facility] Persons participating in the virtual visit: patient/Nurse Health Advisor   I discussed the limitations, risks, security and privacy concerns of performing an evaluation and management service by telephone and the availability of in person appointments. The patient expressed understanding and agreed to proceed.  Interactive audio and video telecommunications were attempted between this nurse and patient, however failed, due to patient having technical difficulties OR patient did not have access to video capability.  We continued and completed visit with audio only.  Some vital signs may be absent or patient reported.   Chauncey Bruno E Nasia Cannan, LPN   Review of Systems     Cardiac Risk Factors include: advanced age (>41mn, >>62women);smoking/ tobacco exposure;sedentary lifestyle;hypertension;diabetes mellitus;Other (see comment), Risk factor comments: COPD     Objective:    Today's Vitals   12/12/21 1532 12/12/21 1533  Weight: 96 lb (43.5 kg)   PainSc:  8    Body mass index is 18.75 kg/m.  Advanced Directives 12/12/2021 12/11/2020 11/26/2019 02/26/2019 02/17/2019 09/16/2018 09/09/2018  Does Patient Have a Medical Advance Directive? No No No No No No Yes  Type of Advance Directive - - - - - - Living will  Does patient want to make changes to medical advance directive? No - Patient declined - - - - Yes (MAU/Ambulatory/Procedural Areas - Information given) No - Patient declined  Copy of [place] Chart? - - - - - - -  Would patient like information on creating a medical advance directive? No - Patient declined No - Patient declined No  - Patient declined No - Patient declined - Yes (MAU/Ambulatory/Procedural Areas - Information given) -    Current Medications (verified) Outpatient Encounter Medications as of 12/12/2021  Medication Sig   Accu-Chek Softclix Lancets lancets Use to check blood sugar two times daily  Dx E11.9   albuterol (PROVENTIL) (2.5 MG/3ML) 0.083% nebulizer solution Take 3 mLs (2.5 mg total) by nebulization every 6 (six) hours as needed for wheezing or shortness of breath.   Alcohol Swabs (B-D SINGLE USE SWABS REGULAR) PADS Test BS BID Dx E11.9   amLODipine (NORVASC) 5 MG tablet Take 1 tablet (5 mg total) by mouth daily.   aspirin EC 81 MG tablet Take 1 tablet (81 mg total) by mouth daily. Swallow whole.   Baclofen 5 MG TABS Take 5 mg by mouth in the morning and at bedtime.   Blood Glucose Calibration (ACCU-CHEK AVIVA) SOLN Use with glucose machine Dx E11.9   Blood Glucose Monitoring Suppl (ACCU-CHEK AVIVA PLUS) w/Device KIT Check BS BID Dx E11.9   gabapentin (NEURONTIN) 100 MG capsule Take 1 capsule (100 mg total) by mouth 3 (three) times daily.   glucose blood (ACCU-CHEK GUIDE) test strip Use to check blood sugar two times daily  Dx E11.9   insulin glargine (LANTUS) 100 UNIT/ML Solostar Pen Inject 28 Units into the skin daily with breakfast.   Insulin Pen Needle (DROPLET PEN NEEDLES) 32G X 5 MM MISC Use with insulin daily Dx E11.9   losartan (COZAAR) 100 MG tablet Take 1 tablet (100 mg total) by mouth daily.   pravastatin (PRAVACHOL) 40 MG tablet Take  1 tablet (40 mg total) by mouth daily.   tiotropium (SPIRIVA HANDIHALER) 18 MCG inhalation capsule Place 1 capsule (18 mcg total) into inhaler and inhale daily.   No facility-administered encounter medications on file as of 12/12/2021.    Allergies (verified) Lipitor [atorvastatin] and Soma [carisoprodol]   History: Past Medical History:  Diagnosis Date   Anxiety    Breast cancer ([place])    COPD (chronic obstructive pulmonary disease) ([place])    Deaf     Depression    Diabetes ([place])    HOH (hard of hearing)    HTN (hypertension)    Hyperlipidemia    Personal history of radiation therapy    Vitamin D deficiency    Past Surgical History:  Procedure Laterality Date   APPLICATION OF ROBOTIC ASSISTANCE FOR SPINAL PROCEDURE N/A 02/23/2019   Procedure: APPLICATION OF ROBOTIC ASSISTANCE FOR SPINAL PROCEDURE;  Surgeon: Kristeen Miss, MD;  Location: [facility];  Service: Neurosurgery;  Laterality: N/A;   BACK SURGERY     BREAST SURGERY     radiation   CARPAL TUNNEL RELEASE     CERVICAL SPINE SURGERY     EYE SURGERY Bilateral    cataracts   GALLBLADDER SURGERY     INNER EAR SURGERY     POSTERIOR LUMBAR FUSION 4 LEVEL N/A 02/23/2019   Procedure: Thoracic ten to Lumbar two decompression with Posterior lumbar interbody fusion lumbar one-lumbar two with Osteotomy at Lumbar one-Lumbar two, Posterior arthrodesis Thoracic ten to Lumbar two with Mazor;  Surgeon: Kristeen Miss, MD;  Location: [facility];  Service: Neurosurgery;  Laterality: N/A;   SPINE SURGERY     Family History  Problem Relation Age of Onset   Heart attack Mother    Stroke Mother    Stroke Sister    Diabetes Other        sister x3, and mother   Lung disease Father        worked in Company secretary cancer Neg Hx    Colon polyps Neg Hx    Social History   Socioeconomic History   Marital status: Married    Spouse name: Sonia Side   Number of children: 1   Years of education: 12   Highest education level: High school graduate  Occupational History   Occupation: disability  Tobacco Use   Smoking status: Every Day    Packs/day: 1.00    Years: 51.00    Pack years: 51.00    Types: Cigarettes   Smokeless tobacco: Never  Vaping Use   Vaping Use: Never used  Substance and Sexual Activity   Alcohol use: No   Drug use: No   Sexual activity: Not Currently    Birth control/protection: None  Other Topics Concern   Not on file  Social History Narrative   Retired/disabled. Lives at  home with her husband. He still works part-time. Currently following social distancing guidelines.    Social Determinants of Health   Financial Resource Strain: Low Risk    Difficulty of Paying Living Expenses: Not hard at all  Food Insecurity: No Food Insecurity   Worried About Charity fundraiser in the Last Year: Never true   [place] in the Last Year: Never true  Transportation Needs: No Transportation Needs   Lack of Transportation (Medical): No   Lack of Transportation (Non-Medical): No  Physical Activity: Insufficiently Active   Days of Exercise per Week: 7 days   Minutes of Exercise per Session: 10  min  Stress: Stress Concern Present   Feeling of Stress : To some extent  Social Connections: Moderately Isolated   Frequency of Communication with Friends and Family: More than three times a week   Frequency of Social Gatherings with Friends and Family: More than three times a week   Attends Religious Services: Never   Marine scientist or Organizations: No   Attends Music therapist: Never   Marital Status: Married    Tobacco Counseling Ready to quit: Not Answered Counseling given: Not Answered   Clinical Intake:  Pre-visit preparation completed: Yes  Pain : 0-10 Pain Score: 8  Pain Type: Chronic pain Pain Location: Back Pain Orientation: Lower Pain Descriptors / Indicators: Sharp, Aching Pain Onset: More than a month ago Pain Frequency: Constant     BMI - recorded: 18.75 Nutritional Status: BMI <19  Underweight Nutritional Risks: None Diabetes: No  How often do you need to have someone help you when you read instructions, pamphlets, or other written materials from your doctor or pharmacy?: 1 - Never  Diabetic? Nutrition Risk Assessment:  Has the patient had any N/V/D within the last 2 months?  No  Does the patient have any non-healing wounds?  No  Has the patient had any unintentional weight loss or weight gain?  Yes    Diabetes:  Is the patient diabetic?  Yes  If diabetic, was a CBG obtained today?  No  Did the patient bring in their glucometer from home?  No  How often do you monitor your CBG's? Once daily fasting.   Financial Strains and Diabetes Management:  Are you having any financial strains with the device, your supplies or your medication? No .  Does the patient want to be seen by Chronic Care Management for management of their diabetes?  No  Would the patient like to be referred to a Nutritionist or for Diabetic Management?  No   Diabetic Exams:  Diabetic Eye Exam: Completed 06/28/2020.   Diabetic Foot Exam: Completed 12/19/2020. Pt has been advised about the importance in completing this exam. Pt is scheduled for diabetic foot exam on next appt.    Interpreter Needed?: No  Information entered by :: Meghen Akopyan, LPN   Activities of Daily Living In your present state of health, do you have any difficulty performing the following activities: 12/12/2021  Hearing? Y  Vision? N  Difficulty concentrating or making decisions? Y  Walking or climbing stairs? Y  Dressing or bathing? N  Doing errands, shopping? N  Preparing Food and eating ? N  Using the Toilet? N  In the past six months, have you accidently leaked urine? N  Do you have problems with loss of bowel control? N  Managing your Medications? N  Managing your Finances? N  Housekeeping or managing your Housekeeping? Y  Some recent data might be hidden    Patient Care Team: Sharion Balloon, FNP as PCP - General (Nurse Practitioner)  Indicate any recent Medical Services you may have received from other than [facility] providers in the past year (date may be approximate).     Assessment:   This is a routine wellness examination for [place].  Hearing/Vision screen Hearing Screening - Comments:: Wears hearing aids - from [facility] in [place] - Comments:: No vision concerns - behind on annual eye exams - no  current eye doctor  Dietary issues and exercise activities discussed: Current Exercise Habits: Home exercise routine, Type of exercise: Other - see  comments (house work), Time (Minutes): 10, Frequency (Times/Week): 7, Weekly Exercise (Minutes/Week): 70, Intensity: Mild, Exercise limited by: orthopedic condition(s);respiratory conditions(s)   Goals Addressed               This Visit's Progress     "I would like to be able to hear better" (pt-stated)   On track     Current Barriers:  Financial Constraints.  Chronic Disease Management support and education needs related to hearing loss and need for hearing aids  Nurse Case Manager Clinical Goal(s):  Over the next 30 days, patient will work with [place] to address needs related to obtaining hearing aids.   Interventions:  Chart Reviewed Talked with patient by phone Discussed hx of hearing loss Care guide referral for assistance with affordable hearing aids  Patient Self Care Activities:  Self administers medications as prescribed Attends all scheduled provider appointments Calls provider office for new concerns or questions  Initial goal documentation       Have 3 meals a day   No change     Continue to have 3 balanced meals per day.       Quit Smoking   Not on track     Barriers: Tobacco addiction  Nurse Care Manager Clinical Goals: Over the next 3 days, patient will decrease by half the amount of cigarettes she smokes.   Interventions:  Smoking cessation resources printed and will be mailed.   Patient Self Care Activities:  Self administers medications as prescribed Calls provider office for new concerns or questions  Initial goal documentation        Depression Screen PHQ 2/9 Scores 12/12/2021 09/26/2021 04/11/2021 12/19/2020 12/11/2020 09/15/2020 06/13/2020  PHQ - 2 Score '1 1 6 ' 0 0 0 0  PHQ- 9 Score - - 18 - - - -    Fall Risk Fall Risk  12/12/2021 09/26/2021 04/11/2021 12/19/2020 12/11/2020  Falls in the  past year? 1 1 0 0 0  Number falls in past yr: 0 0 - - -  Comment - - - - -  Injury with Fall? 1 1 - - -  Risk for fall due to : History of fall(s);Impaired mobility;Medication side effect;Orthopedic patient Impaired balance/gait;History of fall(s) - - -  Follow up Education provided;Falls prevention discussed Falls prevention discussed - - -    FALL RISK PREVENTION PERTAINING TO THE HOME:  Any stairs in or around the home? Yes  If so, are there any without handrails? No  Home free of loose throw rugs in walkways, pet beds, electrical cords, etc? Yes  Adequate lighting in your home to reduce risk of falls? Yes   ASSISTIVE DEVICES UTILIZED TO PREVENT FALLS:  Life alert? No  Use of a cane, walker or w/c?  Holds her husband or the wall while she walks Grab bars in the bathroom? Yes  Shower chair or bench in shower? No  Elevated toilet seat or a handicapped toilet? No   TIMED UP AND GO:  Was the test performed? No . Telephonic visit  Cognitive Function:      6CIT Screen 12/12/2021 12/11/2020 11/26/2019 09/16/2018  What Year? 0 points 0 points 0 points 0 points  What month? 0 points 0 points 0 points 0 points  What time? 0 points 0 points 0 points 0 points  Count back from 20 0 points 0 points 0 points 0 points  Months in reverse 4 points 0 points 2 points 2 points  Repeat phrase 6 points 2  points 0 points 0 points  Total Score '10 2 2 2    ' Immunizations Immunization History  Administered Date(s) Administered   Fluad Quad(high Dose 65+) 07/26/2019, 07/28/2020, 07/10/2021   Influenza Split 08/14/2012, 07/17/2013   Influenza, High Dose Seasonal PF 07/28/2018   Influenza,inj,Quad PF,6+ Mos 07/11/2014, 07/14/2015, 07/15/2016, 07/22/2017   Moderna Covid-19 Vaccine Bivalent Booster 25yr & up 08/14/2021   Moderna Sars-Covid-2 Vaccination 12/09/2019, 01/07/2020, 10/11/2020   Pneumococcal Conjugate-13 02/13/2015   Pneumococcal Polysaccharide-23 10/14/2002, 01/29/2018   Zoster  Recombinat (Shingrix) 09/16/2018    TDAP status: Due, Education has been provided regarding the importance of this vaccine. Advised may receive this vaccine at local pharmacy or Health Dept. Aware to provide a copy of the vaccination record if obtained from local pharmacy or Health Dept. Verbalized acceptance and understanding.  Flu Vaccine status: Up to date  Pneumococcal vaccine status: Up to date  Covid-19 vaccine status: Completed vaccines  Qualifies for Shingles Vaccine? Yes   Zostavax completed No   Shingrix Completed?: No.    Education has been provided regarding the importance of this vaccine. Patient has been advised to call insurance company to determine out of pocket expense if they have not yet received this vaccine. Advised may also receive vaccine at local pharmacy or Health Dept. Verbalized acceptance and understanding.  Screening Tests Health Maintenance  Topic Date Due   TETANUS/TDAP  Never done   Zoster Vaccines- Shingrix (2 of 2) 11/11/2018   DEXA SCAN  09/28/2019   Fecal DNA (Cologuard)  03/04/2021   OPHTHALMOLOGY EXAM  06/28/2021   MAMMOGRAM  04/11/2022 (Originally 02/05/2020)   FOOT EXAM  12/19/2021   HEMOGLOBIN A1C  03/27/2022   Pneumonia Vaccine 70 Years old  Completed   INFLUENZA VACCINE  Completed   COVID-19 Vaccine  Completed   Hepatitis C Screening  Completed   HPV VACCINES  Aged Out    Health Maintenance  Health Maintenance Due  Topic Date Due   TETANUS/TDAP  Never done   Zoster Vaccines- Shingrix (2 of 2) 11/11/2018   DEXA SCAN  09/28/2019   Fecal DNA (Cologuard)  03/04/2021   OPHTHALMOLOGY EXAM  06/28/2021    Colorectal cancer screening: Type of screening: Cologuard. Completed 03/04/2018. Repeat every 3 years ordered repeat today  Mammogram status: Completed 02/04/2018. Repeat every year - she says she will call back to make appointment  Bone Density status: Completed 09/27/2016. Results reflect: Bone density results: OSTEOPENIA. Repeat  every 2 years. - past due - patient may consider at next visit  Lung Cancer Screening: (Low Dose CT Chest recommended if Age 70-80years, 30 pack-year currently smoking OR have quit w/in 15years.) does qualify.   Lung Cancer Screening Referral: unsure about her qualifying - patient advised to discuss with pcp  Additional Screening:  Hepatitis C Screening: does qualify; Completed 10/10/2015  Vision Screening: Recommended annual ophthalmology exams for early detection of glaucoma and other disorders of the eye. Is the patient up to date with their annual eye exam?  No  Who is the provider or what is the name of the office in which the patient attends annual eye exams? unknown If pt is not established with a provider, would they like to be referred to a provider to establish care? No .   Dental Screening: Recommended annual dental exams for proper oral hygiene  Community Resource Referral / Chronic Care Management: CRR required this visit?  No   CCM required this visit?  No      Plan:  I have personally reviewed and noted the following in the patients chart:   Medical and social history Use of alcohol, tobacco or illicit drugs  Current medications and supplements including opioid prescriptions.  Functional ability and status Nutritional status Physical activity Advanced directives List of other physicians Hospitalizations, surgeries, and ER visits in previous 12 months Vitals Screenings to include cognitive, depression, and falls Referrals and appointments  In addition, I have reviewed and discussed with patient certain preventive protocols, quality metrics, and best practice recommendations. A written personalized care plan for preventive services as well as general preventive health recommendations were provided to patient.     Sandrea Hammond, LPN   11/15/1938   Nurse Notes: She's worried about her memory - 6CIT = 10 Also, wants referral for second opinion about her  back - Doesn't feel that Dr Ellene Route is taking her seriously

## 2021-12-25 ENCOUNTER — Encounter: Payer: Self-pay | Admitting: Family

## 2021-12-25 ENCOUNTER — Ambulatory Visit (INDEPENDENT_AMBULATORY_CARE_PROVIDER_SITE_OTHER): Payer: Medicare HMO | Admitting: Family

## 2021-12-25 VITALS — BP 143/77 | HR 74 | Temp 97.5°F | Ht 60.0 in | Wt 96.0 lb

## 2021-12-25 DIAGNOSIS — J449 Chronic obstructive pulmonary disease, unspecified: Secondary | ICD-10-CM | POA: Diagnosis not present

## 2021-12-25 DIAGNOSIS — I10 Essential (primary) hypertension: Secondary | ICD-10-CM

## 2021-12-25 DIAGNOSIS — E785 Hyperlipidemia, unspecified: Secondary | ICD-10-CM | POA: Diagnosis not present

## 2021-12-25 DIAGNOSIS — K219 Gastro-esophageal reflux disease without esophagitis: Secondary | ICD-10-CM | POA: Diagnosis not present

## 2021-12-25 DIAGNOSIS — E1159 Type 2 diabetes mellitus with other circulatory complications: Secondary | ICD-10-CM

## 2021-12-25 DIAGNOSIS — I2510 Atherosclerotic heart disease of native coronary artery without angina pectoris: Secondary | ICD-10-CM

## 2021-12-25 DIAGNOSIS — I2584 Coronary atherosclerosis due to calcified coronary lesion: Secondary | ICD-10-CM

## 2021-12-25 DIAGNOSIS — G8929 Other chronic pain: Secondary | ICD-10-CM

## 2021-12-25 DIAGNOSIS — E0865 Diabetes mellitus due to underlying condition with hyperglycemia: Secondary | ICD-10-CM

## 2021-12-25 DIAGNOSIS — F03A0 Unspecified dementia, mild, without behavioral disturbance, psychotic disturbance, mood disturbance, and anxiety: Secondary | ICD-10-CM | POA: Diagnosis not present

## 2021-12-25 DIAGNOSIS — M5441 Lumbago with sciatica, right side: Secondary | ICD-10-CM

## 2021-12-25 DIAGNOSIS — H9193 Unspecified hearing loss, bilateral: Secondary | ICD-10-CM | POA: Diagnosis not present

## 2021-12-25 DIAGNOSIS — E1169 Type 2 diabetes mellitus with other specified complication: Secondary | ICD-10-CM | POA: Diagnosis not present

## 2021-12-25 DIAGNOSIS — M5416 Radiculopathy, lumbar region: Secondary | ICD-10-CM

## 2021-12-25 DIAGNOSIS — Z716 Tobacco abuse counseling: Secondary | ICD-10-CM

## 2021-12-25 DIAGNOSIS — F17200 Nicotine dependence, unspecified, uncomplicated: Secondary | ICD-10-CM

## 2021-12-25 LAB — BAYER DCA HB A1C WAIVED: HB A1C (BAYER DCA - WAIVED): 7.4 % — ABNORMAL HIGH (ref 4.8–5.6)

## 2021-12-25 MED ORDER — TRELEGY ELLIPTA 100-62.5-25 MCG/ACT IN AEPB: 1.0000 | INHALATION_SPRAY | Freq: Every day | RESPIRATORY_TRACT | 11 refills | Status: DC

## 2021-12-25 MED ORDER — MEMANTINE HCL 5 MG PO TABS: 5.0000 mg | ORAL_TABLET | Freq: Two times a day (BID) | ORAL | 1 refills | Status: DC

## 2021-12-25 MED ORDER — GABAPENTIN 300 MG PO CAPS: 300.0000 mg | ORAL_CAPSULE | Freq: Three times a day (TID) | ORAL | 3 refills | Status: DC

## 2021-12-25 MED ORDER — DONEPEZIL HCL 5 MG PO TABS: 5.0000 mg | ORAL_TABLET | Freq: Every day | ORAL | 1 refills | Status: DC

## 2021-12-25 MED ORDER — ACCU-CHEK GUIDE VI STRP: ORAL_STRIP | 3 refills | Status: DC

## 2021-12-25 NOTE — Patient Instructions (Signed)

## 2021-12-25 NOTE — Progress Notes (Signed)
? ?Subjective:  ? ? Patient ID: Jamie [name], female    DOB: Jan 25, 1951, 71 y.o.   MRN: [id number] ? ?Chief Complaint  ?Patient presents with  ? Medical Management of Chronic Issues  ?  Forgot one of thems cousins name   ? ?PT presents to the office today for chronic follow up.  PT had surgical decompression and stabilization from L2-L4 on 02/05/17. Pt states her back continues to hurt and her pain is a 10 out 10 when she walks or standing. ?  ?Her A1C was elevated last visit.  She is followed by  Endocrinologists.  ?  ?She has CAD and takes statin daily. ? ?She has COPD and has SOB and wheezing. She continues to smoke a pack a day.  ?Hypertension ?This is a chronic problem. The current episode started more than 1 year ago. The problem has been waxing and waning since onset. The problem is uncontrolled. Associated symptoms include malaise/fatigue and shortness of breath. Pertinent negatives include no blurred vision or peripheral edema. Risk factors for coronary artery disease include dyslipidemia, sedentary lifestyle and smoking/tobacco exposure. The current treatment provides moderate improvement.  ?Diabetes ?She presents for her follow-up diabetic visit. She has type 2 diabetes mellitus. Associated symptoms include fatigue and foot paresthesias. Pertinent negatives for diabetes include no blurred vision. Symptoms are stable. Diabetic complications include peripheral neuropathy. Risk factors for coronary artery disease include dyslipidemia, diabetes mellitus, hypertension, sedentary lifestyle and post-menopausal. She is following a generally unhealthy diet. Her overall blood glucose range is 110-130 mg/dl. An ACE inhibitor/angiotensin II receptor blocker is being taken.  ?Hyperlipidemia ?This is a chronic problem. The current episode started more than 1 year ago. The problem is controlled. Associated symptoms include shortness of breath. Current antihyperlipidemic treatment includes statins. The current treatment  provides moderate improvement of lipids. Risk factors for coronary artery disease include dyslipidemia, hypertension, a sedentary lifestyle and post-menopausal.  ?Back Pain ?This is a chronic problem. The current episode started more than 1 year ago. The problem occurs intermittently. The problem has been waxing and waning since onset. The pain is present in the lumbar spine. The quality of the pain is described as aching. The pain is at a severity of 10/10. The pain is moderate.  ? ? ? ?Review of Systems  ?Constitutional:  Positive for fatigue and malaise/fatigue.  ?Eyes:  Negative for blurred vision.  ?Respiratory:  Positive for shortness of breath.   ?Musculoskeletal:  Positive for back pain.  ?All other systems reviewed and are negative. ? ?   ?Objective:  ? Physical Exam ?Vitals reviewed.  ?Constitutional:   ?   General: She is not in acute distress. ?   Appearance: She is well-developed.  ?HENT:  ?   Head: Normocephalic and atraumatic.  ?   Ears:  ?   Comments: HOH ?Eyes:  ?   Pupils: Pupils are equal, round, and reactive to light.  ?Neck:  ?   Thyroid: No thyromegaly.  ?Cardiovascular:  ?   Rate and Rhythm: Normal rate and regular rhythm.  ?   Heart sounds: Normal heart sounds. No murmur heard. ?Pulmonary:  ?   Effort: Pulmonary effort is normal. No respiratory distress.  ?   Breath sounds: Normal breath sounds. No wheezing.  ?Abdominal:  ?   General: Bowel sounds are normal. There is no distension.  ?   Palpations: Abdomen is soft.  ?   Tenderness: There is no abdominal tenderness.  ?Musculoskeletal:     ?  General: Tenderness present. Normal range of motion.  ?   Cervical back: Normal range of motion and neck supple.  ?   Comments: Pain in lumbar with flexion and extension  ?Skin: ?   General: Skin is warm and dry.  ?Neurological:  ?   Mental Status: She is alert and oriented to person, place, and time.  ?   Cranial Nerves: No cranial nerve deficit.  ?   Deep Tendon Reflexes: Reflexes are normal and  symmetric.  ?Psychiatric:     ?   Behavior: Behavior normal.     ?   Thought Content: Thought content normal.     ?   Judgment: Judgment normal.  ? ? ? ? ?BP (!) 152/78   Pulse 79   Temp (!) 97.5 ?F (36.4 ?C) (Temporal)   Ht 5' (1.524 m)   Wt 96 lb (43.5 kg)   BMI 18.75 kg/m?  ? ?   ?Assessment & Plan:  ?Jamie [name] comes in today with chief complaint of Medical Management of Chronic Issues (Forgot one of thems cousins name ) ? ? ?Diagnosis and orders addressed: ? ?1. DM type 2 causing vascular disease ([place]) ?- Bayer DCA Hb A1c Waived ?- CMP14+EGFR ?- CBC with Differential/Platelet ?- glucose blood (ACCU-CHEK GUIDE) test strip; Use to check blood sugar two times daily  Dx E11.9  Dispense: 200 each; Refill: 3 ? ?2. Essential hypertension, benign ?- CMP14+EGFR ?- CBC with Differential/Platelet ? ?3. Coronary artery calcification ?- CMP14+EGFR ?- CBC with Differential/Platelet ? ?4. Moderate COPD (chronic obstructive pulmonary disease) ([place]) ?-Stop Spiriva and start Trelegy  ?- Fluticasone-Umeclidin-Vilant (TRELEGY ELLIPTA) 100-62.5-25 MCG/ACT AEPB; Inhale 1 puff into the lungs daily.  Dispense: 1 each; Refill: 11 ?- CMP14+EGFR ?- CBC with Differential/Platelet ? ?5. Gastroesophageal reflux disease without esophagitis ?- CMP14+EGFR ?- CBC with Differential/Platelet ? ?6. Hyperlipidemia associated with type 2 diabetes mellitus ([place]) ?- CMP14+EGFR ?- CBC with Differential/Platelet ? ?7. Diabetes mellitus due to underlying condition, uncontrolled, with hyperglycemia, without long-term current use of insulin ([place]) ?- CMP14+EGFR ?- CBC with Differential/Platelet ? ?8. Bilateral hearing loss, unspecified hearing loss type ?- CMP14+EGFR ?- CBC with Differential/Platelet ? ?9. Lumbar radiculopathy, chronic ?Will increase gabapentin to 300 mg from 100 mg  ?- gabapentin (NEURONTIN) 300 MG capsule; Take 1 capsule (300 mg total) by mouth 3 (three) times daily.  Dispense: 90 capsule; Refill: 3 ?- CMP14+EGFR ?- CBC with  Differential/Platelet ? ?10. Tobacco abuse counseling ?- CMP14+EGFR ?- CBC with Differential/Platelet ? ?11. Chronic bilateral low back pain with right-sided sciatica ?- CMP14+EGFR ?- CBC with Differential/Platelet ? ?12. Current smoker ?- CMP14+EGFR ?- CBC with Differential/Platelet ? ?13. Mild dementia, unspecified dementia type, unspecified whether behavioral, psychotic, or mood disturbance or anxiety ?Will add aricept and namenda today ?Memory strategies  discussed  ?- donepezil (ARICEPT) 5 MG tablet; Take 1 tablet (5 mg total) by mouth at bedtime.  Dispense: 90 tablet; Refill: 1 ?- memantine (NAMENDA) 5 MG tablet; Take 1 tablet (5 mg total) by mouth 2 (two) times daily.  Dispense: 180 tablet; Refill: 1 ? ? ?Labs pending ?Health Maintenance reviewed ?Diet and exercise encouraged ? ?Follow up plan: ?3 months  ? ?Evelina Dun, FNP ? ? ?

## 2021-12-26 LAB — CBC WITH DIFFERENTIAL/PLATELET
Basophils Absolute: 0.1 10*3/uL (ref 0.0–0.2)
Basos: 1 %
EOS (ABSOLUTE): 0.1 10*3/uL (ref 0.0–0.4)
Eos: 2 %
Hematocrit: 41.2 % (ref 34.0–46.6)
Hemoglobin: 13.2 g/dL (ref 11.1–15.9)
Immature Grans (Abs): 0 10*3/uL (ref 0.0–0.1)
Immature Granulocytes: 0 %
Lymphocytes Absolute: 1.2 10*3/uL (ref 0.7–3.1)
Lymphs: 18 %
MCH: 27 pg (ref 26.6–33.0)
MCHC: 32 g/dL (ref 31.5–35.7)
MCV: 84 fL (ref 79–97)
Monocytes Absolute: 0.5 10*3/uL (ref 0.1–0.9)
Monocytes: 7 %
Neutrophils Absolute: 4.9 10*3/uL (ref 1.4–7.0)
Neutrophils: 72 %
Platelets: 237 10*3/uL (ref 150–450)
RBC: 4.89 x10E6/uL (ref 3.77–5.28)
RDW: 12.5 % (ref 11.7–15.4)
WBC: 6.8 10*3/uL (ref 3.4–10.8)

## 2021-12-26 LAB — CMP14+EGFR
ALT: 12 IU/L (ref 0–32)
AST: 13 IU/L (ref 0–40)
Albumin/Globulin Ratio: 1.8 (ref 1.2–2.2)
Albumin: 3.9 g/dL (ref 3.8–4.8)
Alkaline Phosphatase: 103 IU/L (ref 44–121)
BUN/Creatinine Ratio: 22 (ref 12–28)
BUN: 19 mg/dL (ref 8–27)
Bilirubin Total: <0.2 mg/dL (ref 0.0–1.2)
CO2: 25 mmol/L (ref 20–29)
Calcium: 9.1 mg/dL (ref 8.7–10.3)
Chloride: 102 mmol/L (ref 96–106)
Creatinine, Ser: 0.87 mg/dL (ref 0.57–1.00)
Globulin, Total: 2.2 g/dL (ref 1.5–4.5)
Glucose: 256 mg/dL — ABNORMAL HIGH (ref 70–99)
Potassium: 4.8 mmol/L (ref 3.5–5.2)
Sodium: 137 mmol/L (ref 134–144)
Total Protein: 6.1 g/dL (ref 6.0–8.5)
eGFR: 72 mL/min/{1.73_m2} (ref >59)

## 2021-12-28 ENCOUNTER — Telehealth: Payer: Self-pay | Admitting: Family

## 2022-01-03 NOTE — Telephone Encounter (Signed)
Called busy

## 2022-01-04 ENCOUNTER — Other Ambulatory Visit: Payer: Self-pay | Admitting: Family

## 2022-01-04 DIAGNOSIS — I10 Essential (primary) hypertension: Secondary | ICD-10-CM

## 2022-01-04 NOTE — Telephone Encounter (Signed)
Appt made

## 2022-01-07 ENCOUNTER — Other Ambulatory Visit: Payer: Medicare HMO

## 2022-01-16 ENCOUNTER — Ambulatory Visit: Payer: Medicare HMO

## 2022-01-16 ENCOUNTER — Ambulatory Visit
Admission: RE | Admit: 2022-01-16 | Discharge: 2022-01-16 | Disposition: A | Discharge status: Home / Self Care | Payer: Medicare HMO | Source: Ambulatory Visit | Attending: Family | Admitting: Family

## 2022-01-16 DIAGNOSIS — Z780 Asymptomatic menopausal state: Secondary | ICD-10-CM

## 2022-01-16 DIAGNOSIS — Z1231 Encounter for screening mammogram for malignant neoplasm of breast: Secondary | ICD-10-CM

## 2022-03-26 ENCOUNTER — Ambulatory Visit: Payer: Medicare HMO | Admitting: Family

## 2022-04-08 ENCOUNTER — Other Ambulatory Visit: Payer: Self-pay | Admitting: Family

## 2022-05-02 ENCOUNTER — Ambulatory Visit (INDEPENDENT_AMBULATORY_CARE_PROVIDER_SITE_OTHER): Payer: Medicare HMO | Admitting: Family

## 2022-05-02 ENCOUNTER — Telehealth: Payer: Self-pay | Admitting: Family

## 2022-05-02 ENCOUNTER — Ambulatory Visit: Payer: Medicare HMO

## 2022-05-02 ENCOUNTER — Encounter: Payer: Self-pay | Admitting: Family

## 2022-05-02 VITALS — BP 148/77 | HR 74 | Temp 97.6°F | Ht 60.0 in | Wt 96.6 lb

## 2022-05-02 DIAGNOSIS — E1159 Type 2 diabetes mellitus with other circulatory complications: Secondary | ICD-10-CM

## 2022-05-02 DIAGNOSIS — R636 Underweight: Secondary | ICD-10-CM

## 2022-05-02 DIAGNOSIS — H9193 Unspecified hearing loss, bilateral: Secondary | ICD-10-CM

## 2022-05-02 DIAGNOSIS — M5416 Radiculopathy, lumbar region: Secondary | ICD-10-CM

## 2022-05-02 DIAGNOSIS — E0865 Diabetes mellitus due to underlying condition with hyperglycemia: Secondary | ICD-10-CM

## 2022-05-02 DIAGNOSIS — F17200 Nicotine dependence, unspecified, uncomplicated: Secondary | ICD-10-CM | POA: Diagnosis not present

## 2022-05-02 DIAGNOSIS — Z716 Tobacco abuse counseling: Secondary | ICD-10-CM

## 2022-05-02 DIAGNOSIS — G8929 Other chronic pain: Secondary | ICD-10-CM

## 2022-05-02 DIAGNOSIS — E559 Vitamin D deficiency, unspecified: Secondary | ICD-10-CM

## 2022-05-02 DIAGNOSIS — K219 Gastro-esophageal reflux disease without esophagitis: Secondary | ICD-10-CM | POA: Diagnosis not present

## 2022-05-02 DIAGNOSIS — J449 Chronic obstructive pulmonary disease, unspecified: Secondary | ICD-10-CM

## 2022-05-02 DIAGNOSIS — Z780 Asymptomatic menopausal state: Secondary | ICD-10-CM

## 2022-05-02 DIAGNOSIS — I10 Essential (primary) hypertension: Secondary | ICD-10-CM

## 2022-05-02 DIAGNOSIS — E1169 Type 2 diabetes mellitus with other specified complication: Secondary | ICD-10-CM | POA: Diagnosis not present

## 2022-05-02 DIAGNOSIS — E785 Hyperlipidemia, unspecified: Secondary | ICD-10-CM

## 2022-05-02 DIAGNOSIS — E43 Unspecified severe protein-calorie malnutrition: Secondary | ICD-10-CM

## 2022-05-02 DIAGNOSIS — Z1211 Encounter for screening for malignant neoplasm of colon: Secondary | ICD-10-CM

## 2022-05-02 DIAGNOSIS — M5441 Lumbago with sciatica, right side: Secondary | ICD-10-CM

## 2022-05-02 NOTE — Progress Notes (Signed)
Subjective:    Patient ID: Jamie [name], female    DOB: 04-15-1951, 71 y.o.   MRN: [id number]  Chief Complaint  Patient presents with   Medical Management of Chronic Issues   PT presents to the office today for chronic follow up.  PT had surgical decompression and stabilization from L2-L4 on 02/05/17. Pt states her back continues to hurt and her pain is a 10 out 10 when she walks or standing.   Her A1C was elevated last visit.  She is followed by  Endocrinologists.    She has CAD and takes statin daily.   She has COPD and has SOB and wheezing. She continues to smoke a pack a day.  Diabetes She presents for her follow-up diabetic visit. She has type 2 diabetes mellitus. Pertinent negatives for diabetes include no blurred vision and no foot paresthesias. Symptoms are stable. Diabetic complications include heart disease. Risk factors for coronary artery disease include dyslipidemia, diabetes mellitus, hypertension, sedentary lifestyle and post-menopausal. She is following a generally unhealthy diet. (Does not check at home) Eye exam is not current.  Hypertension This is a chronic problem. The current episode started more than 1 year ago. The problem has been waxing and waning since onset. Associated symptoms include malaise/fatigue and shortness of breath. Pertinent negatives include no blurred vision or peripheral edema. Risk factors for coronary artery disease include dyslipidemia and sedentary lifestyle. The current treatment provides moderate improvement. There is no history of heart failure.  Gastroesophageal Reflux She complains of belching, heartburn and a hoarse voice. She reports no globus sensation. This is a chronic problem. The current episode started more than 1 year ago. The problem occurs occasionally. The treatment provided mild relief.  Hyperlipidemia This is a chronic problem. The current episode started more than 1 year ago. The problem is controlled. Associated symptoms  include shortness of breath. Current antihyperlipidemic treatment includes statins. The current treatment provides moderate improvement of lipids. Risk factors for coronary artery disease include dyslipidemia, diabetes mellitus, hypertension, a sedentary lifestyle and post-menopausal.  Nicotine Dependence Presents for follow-up visit. Her urge triggers include company of smokers. The symptoms have been stable. She smokes 1 pack of cigarettes per day.  Back Pain This is a chronic problem. The current episode started more than 1 year ago. The problem occurs intermittently. The problem has been resolved since onset. The pain is present in the lumbar spine. The quality of the pain is described as aching. The pain is at a severity of 10/10. The symptoms are aggravated by standing and twisting. She has tried bed rest and NSAIDs for the symptoms. The treatment provided mild relief.      Review of Systems  Constitutional:  Positive for malaise/fatigue.  HENT:  Positive for hoarse voice.   Eyes:  Negative for blurred vision.  Respiratory:  Positive for shortness of breath.   Gastrointestinal:  Positive for heartburn.  Musculoskeletal:  Positive for back pain.  All other systems reviewed and are negative.      Objective:   Physical Exam Vitals reviewed.  Constitutional:      General: She is not in acute distress.    Appearance: She is well-developed.  HENT:     Head: Normocephalic and atraumatic.     Right Ear: Tympanic membrane normal.     Left Ear: Tympanic membrane normal.  Eyes:     Pupils: Pupils are equal, round, and reactive to light.  Neck:     Thyroid: No thyromegaly.  Cardiovascular:  Rate and Rhythm: Normal rate and regular rhythm.     Heart sounds: Normal heart sounds. No murmur heard. Pulmonary:     Effort: Pulmonary effort is normal. No respiratory distress.     Breath sounds: Normal breath sounds. No wheezing.  Abdominal:     General: Bowel sounds are normal. There is  no distension.     Palpations: Abdomen is soft.     Tenderness: There is no abdominal tenderness.  Musculoskeletal:        General: No tenderness. Normal range of motion.     Cervical back: Normal range of motion and neck supple.  Skin:    General: Skin is warm and dry.  Neurological:     Mental Status: She is alert and oriented to person, place, and time.     Cranial Nerves: No cranial nerve deficit.     Deep Tendon Reflexes: Reflexes are normal and symmetric.  Psychiatric:        Behavior: Behavior normal.        Thought Content: Thought content normal.        Judgment: Judgment normal.     Diabetic Foot Exam - Simple   Simple Foot Form Diabetic Foot exam was performed with the following findings: Yes 05/02/2022 11:59 AM  Visual Inspection No deformities, no ulcerations, no other skin breakdown bilaterally: Yes Sensation Testing Intact to touch and monofilament testing bilaterally: Yes Pulse Check Posterior Tibialis and Dorsalis pulse intact bilaterally: Yes Comments      BP (!) 162/79   Pulse 74   Temp 97.6 F (36.4 C)   Ht 5' (1.524 m)   Wt 96 lb 9.6 oz (43.8 kg)   SpO2 94%   BMI 18.87 kg/m      Assessment & Plan:  Jamie [name] comes in today with chief complaint of Medical Management of Chronic Issues   Diagnosis and orders addressed:  1. DM type 2 causing vascular disease ([place]) - CMP14+EGFR - CBC with Differential/Platelet - Bayer DCA Hb A1c Waived - Microalbumin / creatinine urine ratio  2. Essential hypertension, benign - CMP14+EGFR - CBC with Differential/Platelet  3. Moderate COPD (chronic obstructive pulmonary disease) (HCC) - CMP14+EGFR - CBC with Differential/Platelet  4. Gastroesophageal reflux disease without esophagitis - CMP14+EGFR - CBC with Differential/Platelet  5. Diabetes mellitus due to underlying condition, uncontrolled, with hyperglycemia, without long-term current use of insulin (HCC) - CMP14+EGFR - CBC with  Differential/Platelet  6. Hyperlipidemia associated with type 2 diabetes mellitus (HCC) - CMP14+EGFR - CBC with Differential/Platelet - Lipid panel  7. Current smoker - CMP14+EGFR - CBC with Differential/Platelet  8. Vitamin D deficiency - CMP14+EGFR - CBC with Differential/Platelet  9. Underweight - CMP14+EGFR - CBC with Differential/Platelet  10. Chronic bilateral low back pain with right-sided sciatica - CMP14+EGFR - CBC with Differential/Platelet - CT Lumbar Spine Wo Contrast; Future  11. Tobacco abuse counseling - CMP14+EGFR - CBC with Differential/Platelet  12. Protein-calorie malnutrition, severe - CMP14+EGFR - CBC with Differential/Platelet  13. Lumbar radiculopathy, chronic - CMP14+EGFR - CBC with Differential/Platelet - CT Lumbar Spine Wo Contrast; Future  14. Bilateral hearing loss, unspecified hearing loss type - CMP14+EGFR - CBC with Differential/Platelet  15. Colon cancer screening - Cologuard - CMP14+EGFR - CBC with Differential/Platelet  16. Post-menopausal - DG [facility] DEXA   Labs pending Health Maintenance reviewed Diet and exercise encouraged  Follow up plan: 3 months   Evelina Dun, FNP

## 2022-05-02 NOTE — Patient Instructions (Signed)

## 2022-05-02 NOTE — Telephone Encounter (Signed)
UNAWARE OF ANY CALL FROM HERE

## 2022-05-07 ENCOUNTER — Other Ambulatory Visit: Payer: Self-pay | Admitting: Family

## 2022-05-07 DIAGNOSIS — R69 Illness, unspecified: Secondary | ICD-10-CM | POA: Diagnosis not present

## 2022-05-07 DIAGNOSIS — M545 Low back pain, unspecified: Secondary | ICD-10-CM

## 2022-05-24 ENCOUNTER — Ambulatory Visit (HOSPITAL_COMMUNITY): Payer: Medicare HMO | Attending: Family

## 2022-06-21 ENCOUNTER — Other Ambulatory Visit: Payer: Self-pay | Admitting: Family

## 2022-06-21 DIAGNOSIS — I10 Essential (primary) hypertension: Secondary | ICD-10-CM

## 2022-06-21 DIAGNOSIS — G8929 Other chronic pain: Secondary | ICD-10-CM

## 2022-06-21 DIAGNOSIS — F03A0 Unspecified dementia, mild, without behavioral disturbance, psychotic disturbance, mood disturbance, and anxiety: Secondary | ICD-10-CM

## 2022-07-16 ENCOUNTER — Encounter: Payer: Self-pay | Admitting: *Deleted

## 2022-08-02 ENCOUNTER — Ambulatory Visit: Payer: Medicare HMO | Admitting: Family

## 2022-08-05 ENCOUNTER — Encounter: Payer: Self-pay | Admitting: Family

## 2022-08-05 ENCOUNTER — Ambulatory Visit (INDEPENDENT_AMBULATORY_CARE_PROVIDER_SITE_OTHER): Payer: Medicare HMO | Admitting: Family

## 2022-08-05 VITALS — BP 114/63 | HR 83 | Temp 97.5°F | Ht 60.0 in | Wt 98.0 lb

## 2022-08-05 DIAGNOSIS — Z0001 Encounter for general adult medical examination with abnormal findings: Secondary | ICD-10-CM

## 2022-08-05 DIAGNOSIS — I2510 Atherosclerotic heart disease of native coronary artery without angina pectoris: Secondary | ICD-10-CM

## 2022-08-05 DIAGNOSIS — E1159 Type 2 diabetes mellitus with other circulatory complications: Secondary | ICD-10-CM | POA: Diagnosis not present

## 2022-08-05 DIAGNOSIS — Z23 Encounter for immunization: Secondary | ICD-10-CM | POA: Diagnosis not present

## 2022-08-05 DIAGNOSIS — E43 Unspecified severe protein-calorie malnutrition: Secondary | ICD-10-CM

## 2022-08-05 DIAGNOSIS — F17200 Nicotine dependence, unspecified, uncomplicated: Secondary | ICD-10-CM

## 2022-08-05 DIAGNOSIS — I2584 Coronary atherosclerosis due to calcified coronary lesion: Secondary | ICD-10-CM | POA: Diagnosis not present

## 2022-08-05 DIAGNOSIS — M5441 Lumbago with sciatica, right side: Secondary | ICD-10-CM

## 2022-08-05 DIAGNOSIS — I10 Essential (primary) hypertension: Secondary | ICD-10-CM

## 2022-08-05 DIAGNOSIS — M5416 Radiculopathy, lumbar region: Secondary | ICD-10-CM

## 2022-08-05 DIAGNOSIS — J449 Chronic obstructive pulmonary disease, unspecified: Secondary | ICD-10-CM | POA: Diagnosis not present

## 2022-08-05 DIAGNOSIS — Z0000 Encounter for general adult medical examination without abnormal findings: Secondary | ICD-10-CM

## 2022-08-05 DIAGNOSIS — G8929 Other chronic pain: Secondary | ICD-10-CM

## 2022-08-05 DIAGNOSIS — K219 Gastro-esophageal reflux disease without esophagitis: Secondary | ICD-10-CM

## 2022-08-05 DIAGNOSIS — E1169 Type 2 diabetes mellitus with other specified complication: Secondary | ICD-10-CM | POA: Diagnosis not present

## 2022-08-05 DIAGNOSIS — H9193 Unspecified hearing loss, bilateral: Secondary | ICD-10-CM

## 2022-08-05 DIAGNOSIS — E785 Hyperlipidemia, unspecified: Secondary | ICD-10-CM

## 2022-08-05 LAB — BAYER DCA HB A1C WAIVED: HB A1C (BAYER DCA - WAIVED): 6.5 % — ABNORMAL HIGH (ref 4.8–5.6)

## 2022-08-05 NOTE — Patient Instructions (Signed)

## 2022-08-05 NOTE — Progress Notes (Signed)
Subjective:    Patient ID: Jamie [name], female    DOB: [birth year], 71 y.o.   MRN: [id number]  Chief Complaint  Patient presents with   Medical Management of Chronic Issues    Flu shot today    PT presents to the office today for CPE and chronic follow up.  PT had surgical decompression and stabilization from L2-L4 on 02/05/17. Pt states her back continues to hurt and her pain is a 10 out 10 when she walks or standing.   She is followed by  Endocrinologists for DM.    She has CAD and takes statin daily.   She has COPD and has SOB and wheezing. She continues to smoke a pack a day. Diabetes She presents for her follow-up diabetic visit. She has type 2 diabetes mellitus. There are no hypoglycemic associated symptoms. Pertinent negatives for diabetes include no blurred vision. Symptoms are stable. Risk factors for coronary artery disease include dyslipidemia, diabetes mellitus, hypertension, sedentary lifestyle and post-menopausal. She is following a generally healthy diet. Her overall blood glucose range is 90-110 mg/dl.  Hypertension This is a chronic problem. The current episode started more than 1 year ago. The problem has been resolved since onset. Associated symptoms include malaise/fatigue. Pertinent negatives include no blurred vision, peripheral edema or shortness of breath. Risk factors for coronary artery disease include diabetes mellitus, dyslipidemia, sedentary lifestyle and smoking/tobacco exposure. The current treatment provides moderate improvement. There is no history of heart failure.  Gastroesophageal Reflux She complains of belching and heartburn. This is a chronic problem. The current episode started more than 1 year ago. The problem occurs occasionally. She has tried a diet change for the symptoms.  Hyperlipidemia This is a chronic problem. The current episode started more than 1 year ago. The problem is controlled. Recent lipid tests were reviewed and are normal.  Pertinent negatives include no shortness of breath. Current antihyperlipidemic treatment includes statins. The current treatment provides moderate improvement of lipids. Risk factors for coronary artery disease include diabetes mellitus, dyslipidemia, hypertension, a sedentary lifestyle and post-menopausal.  Back Pain This is a chronic problem. The current episode started more than 1 year ago. The problem has been waxing and waning since onset. The pain is present in the lumbar spine. The quality of the pain is described as aching. The pain is at a severity of 10/10. The pain is moderate. The symptoms are aggravated by twisting and bending. Risk factors include poor posture. She has tried NSAIDs for the symptoms. The treatment provided mild relief.  Nicotine Dependence Presents for follow-up visit. Her urge triggers include company of smokers. The symptoms have been stable. She smokes 1 pack of cigarettes per day.      Review of Systems  Constitutional:  Positive for malaise/fatigue.  Eyes:  Negative for blurred vision.  Respiratory:  Negative for shortness of breath.   Gastrointestinal:  Positive for heartburn.  Musculoskeletal:  Positive for back pain.  All other systems reviewed and are negative.  Family History  Problem Relation Age of Onset   Heart attack Mother    Stroke Mother    Stroke Sister    Diabetes Other        sister x3, and mother   Lung disease Father        worked in Company secretary cancer Neg Hx    Colon polyps Neg Hx    Social History   Socioeconomic History   Marital status: Married  Spouse name: Sonia Side   Number of children: 1   Years of education: 12   Highest education level: High school graduate  Occupational History   Occupation: disability  Tobacco Use   Smoking status: Every Day    Packs/day: 1.00    Years: 51.00    Total pack years: 51.00    Types: Cigarettes   Smokeless tobacco: Never  Vaping Use   Vaping Use: Never used  Substance  and Sexual Activity   Alcohol use: No   Drug use: No   Sexual activity: Not Currently    Birth control/protection: None  Other Topics Concern   Not on file  Social History Narrative   Retired/disabled. Lives at home with her husband. He still works part-time. Currently following social distancing guidelines.    Social Determinants of Health   Financial Resource Strain: Low Risk  (12/12/2021)   Overall Financial Resource Strain (CARDIA)    Difficulty of Paying Living Expenses: Not hard at all  Food Insecurity: No Food Insecurity (12/12/2021)   Hunger Vital Sign    Worried About Running Out of Food in the Last Year: Never true    Ran Out of Food in the Last Year: Never true  Transportation Needs: No Transportation Needs (12/12/2021)   PRAPARE - Hydrologist (Medical): No    Lack of Transportation (Non-Medical): No  Physical Activity: Insufficiently Active (12/12/2021)   Exercise Vital Sign    Days of Exercise per Week: 7 days    Minutes of Exercise per Session: 10 min  Stress: Stress Concern Present (12/12/2021)   Dent    Feeling of Stress : To some extent  Social Connections: Moderately Isolated (12/12/2021)   Social Connection and Isolation Panel [NHANES]    Frequency of Communication with Friends and Family: More than three times a week    Frequency of Social Gatherings with Friends and Family: More than three times a week    Attends Religious Services: Never    Marine scientist or Organizations: No    Attends Archivist Meetings: Never    Marital Status: Married       Objective:   Physical Exam Vitals reviewed.  Constitutional:      General: She is not in acute distress.    Appearance: She is underweight.  HENT:     Head: Normocephalic and atraumatic.     Right Ear: Tympanic membrane normal.     Left Ear: Tympanic membrane normal.  Eyes:     Pupils: Pupils are  equal, round, and reactive to light.  Neck:     Thyroid: No thyromegaly.  Cardiovascular:     Rate and Rhythm: Normal rate and regular rhythm.     Heart sounds: Normal heart sounds. No murmur heard. Pulmonary:     Effort: Pulmonary effort is normal. No respiratory distress.     Breath sounds: Normal breath sounds. No wheezing.  Abdominal:     General: Bowel sounds are normal. There is no distension.     Palpations: Abdomen is soft.     Tenderness: There is no abdominal tenderness.  Musculoskeletal:        General: No tenderness.     Cervical back: Normal range of motion and neck supple.     Comments: Pain in lumbar with flexion and extension  Skin:    General: Skin is warm and dry.     Coloration: Skin is pale.  Neurological:     Mental Status: She is alert and oriented to person, place, and time.     Cranial Nerves: No cranial nerve deficit.     Deep Tendon Reflexes: Reflexes are normal and symmetric.  Psychiatric:        Behavior: Behavior normal.        Thought Content: Thought content normal.        Judgment: Judgment normal.       BP 114/63   Pulse 83   Temp (!) 97.5 F (36.4 C) (Temporal)   Ht 5' (1.524 m)   Wt 98 lb (44.5 kg)   BMI 19.14 kg/m      Assessment & Plan:   Jamie [name] comes in today with chief complaint of Medical Management of Chronic Issues (Flu shot today )   Diagnosis and orders addressed:  1. Need for immunization against influenza - Flu Vaccine QUAD High Dose(Fluad) - CMP14+EGFR - CBC with Differential/Platelet  2. Coronary artery calcification - CMP14+EGFR - CBC with Differential/Platelet  3. DM type 2 causing vascular disease ([place]) - CMP14+EGFR - CBC with Differential/Platelet - Bayer DCA Hb A1c Waived - Microalbumin / creatinine urine ratio  4. Essential hypertension, benign - CMP14+EGFR - CBC with Differential/Platelet  5. Moderate COPD (chronic obstructive pulmonary disease) (HCC) - CMP14+EGFR - CBC with  Differential/Platelet  6. Gastroesophageal reflux disease without esophagitis - CMP14+EGFR - CBC with Differential/Platelet  7. Hyperlipidemia associated with type 2 diabetes mellitus (HCC) - CMP14+EGFR - CBC with Differential/Platelet  8. Bilateral hearing loss, unspecified hearing loss type - CMP14+EGFR - CBC with Differential/Platelet  9. Lumbar radiculopathy, chronic - Ambulatory referral to Neurosurgery - CMP14+EGFR - CBC with Differential/Platelet  10. Current smoker - CMP14+EGFR - CBC with Differential/Platelet  11. Chronic bilateral low back pain with right-sided sciatica - Ambulatory referral to Neurosurgery - CMP14+EGFR - CBC with Differential/Platelet  12. Protein-calorie malnutrition, severe - CMP14+EGFR - CBC with Differential/Platelet  13. Annual physical exam - CMP14+EGFR - CBC with Differential/Platelet - Bayer DCA Hb A1c Waived - Lipid panel - Microalbumin / creatinine urine ratio - TSH   Labs pending Health Maintenance reviewed Diet and exercise encouraged  Follow up plan: 3 months    Evelina Dun, FNP

## 2022-08-06 LAB — CBC WITH DIFFERENTIAL/PLATELET
Basophils Absolute: 0 10*3/uL (ref 0.0–0.2)
Basos: 1 %
EOS (ABSOLUTE): 0.1 10*3/uL (ref 0.0–0.4)
Eos: 2 %
Hematocrit: 40.2 % (ref 34.0–46.6)
Hemoglobin: 13.2 g/dL (ref 11.1–15.9)
Immature Grans (Abs): 0 10*3/uL (ref 0.0–0.1)
Immature Granulocytes: 0 %
Lymphocytes Absolute: 1.1 10*3/uL (ref 0.7–3.1)
Lymphs: 15 %
MCH: 27.4 pg (ref 26.6–33.0)
MCHC: 32.8 g/dL (ref 31.5–35.7)
MCV: 84 fL (ref 79–97)
Monocytes Absolute: 0.5 10*3/uL (ref 0.1–0.9)
Monocytes: 6 %
Neutrophils Absolute: 5.5 10*3/uL (ref 1.4–7.0)
Neutrophils: 76 %
Platelets: 261 10*3/uL (ref 150–450)
RBC: 4.81 x10E6/uL (ref 3.77–5.28)
RDW: 12.8 % (ref 11.7–15.4)
WBC: 7.2 10*3/uL (ref 3.4–10.8)

## 2022-08-06 LAB — CMP14+EGFR
ALT: 8 IU/L (ref 0–32)
AST: 11 IU/L (ref 0–40)
Albumin/Globulin Ratio: 1.6 (ref 1.2–2.2)
Albumin: 4.1 g/dL (ref 3.9–4.9)
Alkaline Phosphatase: 104 IU/L (ref 44–121)
BUN/Creatinine Ratio: 21 (ref 12–28)
BUN: 25 mg/dL (ref 8–27)
Bilirubin Total: <0.2 mg/dL (ref 0.0–1.2)
CO2: 23 mmol/L (ref 20–29)
Calcium: 9.6 mg/dL (ref 8.7–10.3)
Chloride: 101 mmol/L (ref 96–106)
Creatinine, Ser: 1.18 mg/dL — ABNORMAL HIGH (ref 0.57–1.00)
Globulin, Total: 2.5 g/dL (ref 1.5–4.5)
Glucose: 253 mg/dL — ABNORMAL HIGH (ref 70–99)
Potassium: 4.6 mmol/L (ref 3.5–5.2)
Sodium: 138 mmol/L (ref 134–144)
Total Protein: 6.6 g/dL (ref 6.0–8.5)
eGFR: 50 mL/min/{1.73_m2} — ABNORMAL LOW (ref >59)

## 2022-08-06 LAB — LIPID PANEL
Chol/HDL Ratio: 2.8 ratio (ref 0.0–4.4)
Cholesterol, Total: 138 mg/dL (ref 100–199)
HDL: 49 mg/dL (ref >39)
LDL Chol Calc (NIH): 70 mg/dL (ref 0–99)
Triglycerides: 100 mg/dL (ref 0–149)
VLDL Cholesterol Cal: 19 mg/dL (ref 5–40)

## 2022-08-06 LAB — TSH: TSH: 0.7 u[IU]/mL (ref 0.450–4.500)

## 2022-08-07 LAB — MICROALBUMIN / CREATININE URINE RATIO
Creatinine, Urine: 189.5 mg/dL
Microalb/Creat Ratio: 14 mg/g creat (ref 0–29)
Microalbumin, Urine: 26.7 ug/mL

## 2022-08-19 ENCOUNTER — Other Ambulatory Visit: Payer: Self-pay | Admitting: Family

## 2022-08-19 DIAGNOSIS — E1159 Type 2 diabetes mellitus with other circulatory complications: Secondary | ICD-10-CM

## 2022-08-21 NOTE — Progress Notes (Signed)
[facility] Quality Team Note  Name: LEATHA ROHNER Date of Birth: 11-21-1950 MRN: [id number] Date: 08/21/2022  [facility] Quality Team has reviewed this patient's chart, please see recommendations below:  Diabetic Retinal Eye Exam; Patient requests [facility] Quality Coordinator to schedule Diabetic Retinal Screening at [facility] eye event 09/26/2022, 1:00pm).

## 2022-09-18 DIAGNOSIS — M4015 Other secondary kyphosis, thoracolumbar region: Secondary | ICD-10-CM | POA: Diagnosis not present

## 2022-09-18 DIAGNOSIS — M545 Low back pain, unspecified: Secondary | ICD-10-CM | POA: Diagnosis not present

## 2022-09-18 DIAGNOSIS — M549 Dorsalgia, unspecified: Secondary | ICD-10-CM | POA: Diagnosis not present

## 2022-10-09 ENCOUNTER — Other Ambulatory Visit: Payer: Self-pay | Admitting: Family

## 2022-10-24 ENCOUNTER — Other Ambulatory Visit (HOSPITAL_COMMUNITY): Payer: Self-pay | Admitting: Neurological Surgery

## 2022-10-24 DIAGNOSIS — M545 Low back pain, unspecified: Secondary | ICD-10-CM

## 2022-10-28 ENCOUNTER — Ambulatory Visit (HOSPITAL_COMMUNITY)
Admission: RE | Admit: 2022-10-28 | Discharge: 2022-10-28 | Disposition: A | Discharge status: Home / Self Care | Payer: Medicare HMO | Source: Ambulatory Visit | Attending: Neurological Surgery | Admitting: Neurological Surgery

## 2022-10-28 DIAGNOSIS — M545 Low back pain, unspecified: Secondary | ICD-10-CM | POA: Insufficient documentation

## 2022-10-28 DIAGNOSIS — M5136 Other intervertebral disc degeneration, lumbar region: Secondary | ICD-10-CM | POA: Diagnosis not present

## 2022-10-28 DIAGNOSIS — M5126 Other intervertebral disc displacement, lumbar region: Secondary | ICD-10-CM | POA: Diagnosis not present

## 2022-11-05 ENCOUNTER — Ambulatory Visit: Payer: Medicare HMO | Admitting: Family

## 2022-11-07 ENCOUNTER — Ambulatory Visit (INDEPENDENT_AMBULATORY_CARE_PROVIDER_SITE_OTHER): Payer: Medicare HMO | Admitting: Family

## 2022-11-07 ENCOUNTER — Encounter: Payer: Self-pay | Admitting: Family

## 2022-11-07 VITALS — BP 120/69 | HR 86 | Temp 97.5°F | Ht 60.0 in | Wt 98.0 lb

## 2022-11-07 DIAGNOSIS — I152 Hypertension secondary to endocrine disorders: Secondary | ICD-10-CM | POA: Diagnosis not present

## 2022-11-07 DIAGNOSIS — Z716 Tobacco abuse counseling: Secondary | ICD-10-CM

## 2022-11-07 DIAGNOSIS — G300 Alzheimer's disease with early onset: Secondary | ICD-10-CM

## 2022-11-07 DIAGNOSIS — H9193 Unspecified hearing loss, bilateral: Secondary | ICD-10-CM

## 2022-11-07 DIAGNOSIS — I10 Essential (primary) hypertension: Secondary | ICD-10-CM

## 2022-11-07 DIAGNOSIS — M5416 Radiculopathy, lumbar region: Secondary | ICD-10-CM | POA: Diagnosis not present

## 2022-11-07 DIAGNOSIS — G8929 Other chronic pain: Secondary | ICD-10-CM | POA: Diagnosis not present

## 2022-11-07 DIAGNOSIS — E1169 Type 2 diabetes mellitus with other specified complication: Secondary | ICD-10-CM | POA: Diagnosis not present

## 2022-11-07 DIAGNOSIS — M5441 Lumbago with sciatica, right side: Secondary | ICD-10-CM

## 2022-11-07 DIAGNOSIS — E1159 Type 2 diabetes mellitus with other circulatory complications: Secondary | ICD-10-CM | POA: Diagnosis not present

## 2022-11-07 DIAGNOSIS — F17200 Nicotine dependence, unspecified, uncomplicated: Secondary | ICD-10-CM | POA: Diagnosis not present

## 2022-11-07 DIAGNOSIS — F17210 Nicotine dependence, cigarettes, uncomplicated: Secondary | ICD-10-CM

## 2022-11-07 DIAGNOSIS — F02B11 Dementia in other diseases classified elsewhere, moderate, with agitation: Secondary | ICD-10-CM

## 2022-11-07 DIAGNOSIS — E559 Vitamin D deficiency, unspecified: Secondary | ICD-10-CM

## 2022-11-07 DIAGNOSIS — I2584 Coronary atherosclerosis due to calcified coronary lesion: Secondary | ICD-10-CM

## 2022-11-07 DIAGNOSIS — K219 Gastro-esophageal reflux disease without esophagitis: Secondary | ICD-10-CM

## 2022-11-07 DIAGNOSIS — E785 Hyperlipidemia, unspecified: Secondary | ICD-10-CM

## 2022-11-07 DIAGNOSIS — I2510 Atherosclerotic heart disease of native coronary artery without angina pectoris: Secondary | ICD-10-CM | POA: Diagnosis not present

## 2022-11-07 DIAGNOSIS — J449 Chronic obstructive pulmonary disease, unspecified: Secondary | ICD-10-CM

## 2022-11-07 DIAGNOSIS — R636 Underweight: Secondary | ICD-10-CM

## 2022-11-07 LAB — CMP14+EGFR
ALT: 11 IU/L (ref 0–32)
AST: 14 IU/L (ref 0–40)
Albumin/Globulin Ratio: 1.5 (ref 1.2–2.2)
Albumin: 3.8 g/dL — ABNORMAL LOW (ref 3.9–4.9)
Alkaline Phosphatase: 96 IU/L (ref 44–121)
BUN/Creatinine Ratio: 19 (ref 12–28)
BUN: 20 mg/dL (ref 8–27)
Bilirubin Total: 0.2 mg/dL (ref 0.0–1.2)
CO2: 20 mmol/L (ref 20–29)
Calcium: 8.9 mg/dL (ref 8.7–10.3)
Chloride: 100 mmol/L (ref 96–106)
Creatinine, Ser: 1.08 mg/dL — ABNORMAL HIGH (ref 0.57–1.00)
Globulin, Total: 2.5 g/dL (ref 1.5–4.5)
Glucose: 290 mg/dL — ABNORMAL HIGH (ref 70–99)
Potassium: 4.4 mmol/L (ref 3.5–5.2)
Sodium: 135 mmol/L (ref 134–144)
Total Protein: 6.3 g/dL (ref 6.0–8.5)
eGFR: 55 mL/min/{1.73_m2} — ABNORMAL LOW (ref >59)

## 2022-11-07 LAB — CBC WITH DIFFERENTIAL/PLATELET
Basophils Absolute: 0 10*3/uL (ref 0.0–0.2)
Basos: 0 %
EOS (ABSOLUTE): 0.1 10*3/uL (ref 0.0–0.4)
Eos: 1 %
Hematocrit: 37.1 % (ref 34.0–46.6)
Hemoglobin: 11.9 g/dL (ref 11.1–15.9)
Immature Grans (Abs): 0 10*3/uL (ref 0.0–0.1)
Immature Granulocytes: 0 %
Lymphocytes Absolute: 1.1 10*3/uL (ref 0.7–3.1)
Lymphs: 15 %
MCH: 26.5 pg — ABNORMAL LOW (ref 26.6–33.0)
MCHC: 32.1 g/dL (ref 31.5–35.7)
MCV: 83 fL (ref 79–97)
Monocytes Absolute: 0.5 10*3/uL (ref 0.1–0.9)
Monocytes: 6 %
Neutrophils Absolute: 5.5 10*3/uL (ref 1.4–7.0)
Neutrophils: 78 %
Platelets: 224 10*3/uL (ref 150–450)
RBC: 4.49 x10E6/uL (ref 3.77–5.28)
RDW: 12.4 % (ref 11.7–15.4)
WBC: 7.1 10*3/uL (ref 3.4–10.8)

## 2022-11-07 LAB — BAYER DCA HB A1C WAIVED: HB A1C (BAYER DCA - WAIVED): 6.6 % — ABNORMAL HIGH (ref 4.8–5.6)

## 2022-11-07 MED ORDER — DONEPEZIL HCL 10 MG PO TABS: 10.0000 mg | ORAL_TABLET | Freq: Every day | ORAL | 1 refills | Status: DC

## 2022-11-07 MED ORDER — MEMANTINE HCL 10 MG PO TABS: 10.0000 mg | ORAL_TABLET | Freq: Two times a day (BID) | ORAL | 1 refills | Status: DC

## 2022-11-07 NOTE — Patient Instructions (Signed)
Diabetes Mellitus Action Plan Following a diabetes action plan is a way for you to manage your diabetes (diabetes mellitus) symptoms. The plan is color-coded to help you understand what actions you need to take based on any symptoms you are having. If you have symptoms in the red zone, you need medical care right away. If you have symptoms in the yellow zone, you are having problems. If you have symptoms in the green zone, you are doing well. Learning about and understanding diabetes can take time. Follow the plan that you develop with your health care provider. Know the target range for your blood sugar (glucose) level, and review your treatment plan with your health care provider at each visit. The target range for my blood sugar level is __________________________ mg/dL. Red zone Get medical help right away if you have any of the following symptoms: A blood sugar test result that is below 54 mg/dL (3 mmol/L). A blood sugar test result that is at or above 240 mg/dL (13.3 mmol/L) for 2 days in a row. Confusion or trouble thinking clearly. Difficulty breathing. Sickness or a fever for 2 or more days that is not getting better. Moderate or large ketone levels in your urine. Feeling tired or having no energy. If you have any red zone symptoms, do not wait to see if the symptoms will go away. Get medical help right away. Call your local emergency services (911 in the [place] Do not drive yourself to the hospital. If you have severely low blood sugar (severe hypoglycemia) and you cannot eat or drink, you may need glucagon. Make sure a family member or close friend knows how to check your blood sugar and how to give you glucagon. You may need to be treated in a hospital for this condition. Yellow zone If you have any of the following symptoms, your diabetes is not under control and you may need to make some changes: A blood sugar test result that is at or above 240 mg/dL (13.3 mmol/L) for 2 days in a  row. Blood sugar test results that are below 70 mg/dL (3.9 mmol/L). Other symptoms of hypoglycemia, such as: Shaking or feeling light-headed. Confusion or irritability. Feeling hungry. Having a fast heartbeat. If you have any yellow zone symptoms: Treat your hypoglycemia by eating or drinking 15 grams of a rapid-acting carbohydrate. Follow the 15:15 rule: Take 15 grams of a rapid-acting carbohydrate, such as: 1 tube of glucose gel. 4 glucose pills. 4 oz (120 mL) of fruit juice. 4 oz (120 mL) of regular (not diet) soda. Check your blood sugar 15 minutes after you take the carbohydrate. If the repeat blood sugar test is still at or below 70 mg/dL (3.9 mmol/L), take 15 grams of a carbohydrate again. If your blood sugar does not increase above 70 mg/dL (3.9 mmol/L) after 3 tries, get medical help right away. After your blood sugar returns to normal, eat a meal or a snack within 1 hour. Keep taking your daily medicines as told by your health care provider. Check your blood sugar more often than you normally would. Write down your results. Call your health care provider if you have trouble keeping your blood sugar in your target range.  Green zone These signs mean you are doing well and you can continue what you are doing to manage your diabetes: Your blood sugar is within your personal target range. For most people, a blood sugar level before a meal (preprandial) should be 80-130 mg/dL (4.4-7.2 mmol/L). You feel   well, and you are able to do daily activities. If you are in the green zone, continue to manage your diabetes as told by your health care provider. To do this: Eat a healthy diet. Exercise regularly. Check your blood sugar as told by your health care provider. Take your medicines as told by your health care provider.  Where to find more information American Diabetes Association (ADA): diabetes.org Association of Diabetes Care & Education Specialists (ADCES):  diabeteseducator.org Summary Following a diabetes action plan is a way for you to manage your diabetes symptoms. The plan is color-coded to help you understand what actions you need to take based on any symptoms you are having. Follow the plan that you develop with your health care provider. Make sure you know your personal target blood sugar level. Review your treatment plan with your health care provider at each visit. This information is not intended to replace advice given to you by your health care provider. Make sure you discuss any questions you have with your health care provider. Document Revised: 04/06/2020 Document Reviewed: 04/06/2020 Elsevier Patient Education  2023 [employer].  

## 2022-11-07 NOTE — Progress Notes (Signed)
Subjective:    Patient ID: Jamie [name], female    DOB: December 02, 1950, 72 y.o.   MRN: [id number]  Chief Complaint  Patient presents with   Medical Management of Chronic Issues    PT presents to the office today for chronic follow up.  PT had surgical decompression and stabilization from L2-L4 on 02/05/17. Pt states her back continues to hurt and her pain is a 8 out 10 when she walks or standing. Has an appointment with Neurosurgeon next week.     She has CAD and takes statin daily.   She has COPD and has SOB and wheezing. She continues to smoke less than a pack a day.   Husband reports memory changes.  Diabetes She presents for her follow-up diabetic visit. She has type 2 diabetes mellitus. Associated symptoms include foot paresthesias. Pertinent negatives for diabetes include no blurred vision. Symptoms are stable. Diabetic complications include peripheral neuropathy. Risk factors for coronary artery disease include diabetes mellitus, dyslipidemia, hypertension, sedentary lifestyle and post-menopausal. She is following a generally healthy diet. Her overall blood glucose range is 90-110 mg/dl. Eye exam is not current.  Hypertension This is a chronic problem. The current episode started more than 1 year ago. The problem has been resolved since onset. Associated symptoms include malaise/fatigue and shortness of breath. Pertinent negatives include no blurred vision or peripheral edema. Risk factors for coronary artery disease include dyslipidemia, diabetes mellitus, sedentary lifestyle and smoking/tobacco exposure. The current treatment provides moderate improvement.  Gastroesophageal Reflux She complains of belching, heartburn and a hoarse voice. This is a chronic problem. The current episode started more than 1 year ago. The problem occurs occasionally. She has tried a PPI for the symptoms. The treatment provided moderate relief.  Hyperlipidemia This is a chronic problem. The current episode  started more than 1 year ago. The problem is controlled. Recent lipid tests were reviewed and are normal. Associated symptoms include shortness of breath. Current antihyperlipidemic treatment includes statins. The current treatment provides moderate improvement of lipids. Risk factors for coronary artery disease include dyslipidemia, diabetes mellitus, hypertension, a sedentary lifestyle and post-menopausal.  Nicotine Dependence Presents for follow-up visit. Her urge triggers include company of smokers. The symptoms have been stable. She smokes < 1/2 a pack of cigarettes per day.  Back Pain This is a chronic problem. The current episode started more than 1 year ago. The problem occurs intermittently. The pain is present in the lumbar spine. The quality of the pain is described as aching. The pain is at a severity of 8/10. The pain is moderate. The treatment provided moderate relief.      Review of Systems  Constitutional:  Positive for malaise/fatigue.  HENT:  Positive for hoarse voice.   Eyes:  Negative for blurred vision.  Respiratory:  Positive for shortness of breath.   Gastrointestinal:  Positive for heartburn.  Musculoskeletal:  Positive for back pain.  All other systems reviewed and are negative.      Objective:   Physical Exam Vitals reviewed.  Constitutional:      General: She is not in acute distress.    Appearance: She is well-developed.  HENT:     Head: Normocephalic and atraumatic.     Ears:     Comments: HOH Eyes:     Pupils: Pupils are equal, round, and reactive to light.  Neck:     Thyroid: No thyromegaly.  Cardiovascular:     Rate and Rhythm: Normal rate and regular rhythm.  Heart sounds: Normal heart sounds. No murmur heard. Pulmonary:     Effort: Pulmonary effort is normal. No respiratory distress.     Breath sounds: Normal breath sounds. No wheezing.  Abdominal:     General: Bowel sounds are normal. There is no distension.     Palpations: Abdomen is  soft.     Tenderness: There is no abdominal tenderness.  Musculoskeletal:        General: No tenderness.     Cervical back: Normal range of motion and neck supple.     Comments: Pain in lumbar with flexion  Skin:    General: Skin is warm and dry.  Neurological:     Mental Status: She is alert and oriented to person, place, and time.     Cranial Nerves: No cranial nerve deficit.     Deep Tendon Reflexes: Reflexes are normal and symmetric.  Psychiatric:        Behavior: Behavior normal.        Thought Content: Thought content normal.        Judgment: Judgment normal.       BP 120/69   Pulse 86   Temp (!) 97.5 F (36.4 C) (Temporal)   Ht 5' (1.524 m)   Wt 98 lb (44.5 kg)   SpO2 94%   BMI 19.14 kg/m      Assessment & Plan:  STEVE GREGG comes in today with chief complaint of Medical Management of Chronic Issues   Diagnosis and orders addressed:  1. Chronic bilateral low back pain with right-sided sciatica - CMP14+EGFR - CBC with Differential/Platelet  2. Coronary artery calcification - CMP14+EGFR - CBC with Differential/Platelet  3. Current smoker - CMP14+EGFR - CBC with Differential/Platelet  4. DM type 2 causing vascular disease ([place]) - Bayer DCA Hb A1c Waived - CMP14+EGFR - CBC with Differential/Platelet  5. Essential hypertension, benign - CMP14+EGFR - CBC with Differential/Platelet  6. Gastroesophageal reflux disease without esophagitis - CMP14+EGFR - CBC with Differential/Platelet  7. Bilateral hearing loss, unspecified hearing loss type - CMP14+EGFR - CBC with Differential/Platelet  8. Hyperlipidemia associated with type 2 diabetes mellitus (HCC)  - CMP14+EGFR - CBC with Differential/Platelet  9. Lumbar radiculopathy, chronic - CMP14+EGFR - CBC with Differential/Platelet  10. Moderate COPD (chronic obstructive pulmonary disease) (HCC) - CMP14+EGFR - CBC with Differential/Platelet  11. Tobacco abuse counseling - CMP14+EGFR - CBC  with Differential/Platelet  12. Underweight - CMP14+EGFR - CBC with Differential/Platelet  13. Vitamin D deficiency - CMP14+EGFR - CBC with Differential/Platelet  14. Moderate early onset Alzheimer's dementia with agitation (HCC) Will increase Aricept and Namenda to 10 mg from 5 mg Memory strategies  discussed  - CMP14+EGFR - CBC with Differential/Platelet - donepezil (ARICEPT) 10 MG tablet; Take 1 tablet (10 mg total) by mouth at bedtime.  Dispense: 90 tablet; Refill: 1 - memantine (NAMENDA) 10 MG tablet; Take 1 tablet (10 mg total) by mouth 2 (two) times daily.  Dispense: 180 tablet; Refill: 1   Labs pending Health Maintenance reviewed Diet and exercise encouraged  Follow up plan: 3 months and keep neurosurgeon follow up   Evelina Dun, FNP

## 2022-11-13 DIAGNOSIS — Z681 Body mass index (BMI) 19 or less, adult: Secondary | ICD-10-CM | POA: Diagnosis not present

## 2022-11-13 DIAGNOSIS — M5416 Radiculopathy, lumbar region: Secondary | ICD-10-CM | POA: Diagnosis not present

## 2022-11-14 ENCOUNTER — Ambulatory Visit (HOSPITAL_COMMUNITY): Payer: Medicare HMO

## 2022-11-15 ENCOUNTER — Other Ambulatory Visit (HOSPITAL_BASED_OUTPATIENT_CLINIC_OR_DEPARTMENT_OTHER): Payer: Self-pay | Admitting: Neurological Surgery

## 2022-11-15 DIAGNOSIS — M5416 Radiculopathy, lumbar region: Secondary | ICD-10-CM

## 2022-11-19 ENCOUNTER — Telehealth: Payer: Self-pay | Admitting: Family Medicine

## 2022-11-19 ENCOUNTER — Other Ambulatory Visit: Payer: Self-pay | Admitting: Family Medicine

## 2022-11-20 NOTE — Telephone Encounter (Signed)
Appt made

## 2022-11-22 ENCOUNTER — Other Ambulatory Visit: Payer: Self-pay | Admitting: Family

## 2022-11-22 DIAGNOSIS — E1159 Type 2 diabetes mellitus with other circulatory complications: Secondary | ICD-10-CM

## 2022-11-25 ENCOUNTER — Ambulatory Visit: Payer: Medicare HMO | Admitting: Family

## 2022-11-25 ENCOUNTER — Ambulatory Visit (HOSPITAL_BASED_OUTPATIENT_CLINIC_OR_DEPARTMENT_OTHER)
Admission: RE | Admit: 2022-11-25 | Discharge: 2022-11-25 | Disposition: A | Discharge status: Home / Self Care | Payer: Medicare HMO | Source: Ambulatory Visit | Attending: Neurological Surgery | Admitting: Neurological Surgery

## 2022-11-25 DIAGNOSIS — M5416 Radiculopathy, lumbar region: Secondary | ICD-10-CM | POA: Insufficient documentation

## 2022-11-25 DIAGNOSIS — F17210 Nicotine dependence, cigarettes, uncomplicated: Secondary | ICD-10-CM | POA: Diagnosis not present

## 2022-11-25 DIAGNOSIS — M810 Age-related osteoporosis without current pathological fracture: Secondary | ICD-10-CM | POA: Diagnosis not present

## 2022-11-25 DIAGNOSIS — Z780 Asymptomatic menopausal state: Secondary | ICD-10-CM | POA: Diagnosis not present

## 2022-12-16 ENCOUNTER — Ambulatory Visit: Payer: Medicare HMO | Admitting: Family

## 2022-12-30 ENCOUNTER — Ambulatory Visit (INDEPENDENT_AMBULATORY_CARE_PROVIDER_SITE_OTHER): Payer: Medicare HMO

## 2022-12-30 VITALS — Ht 66.0 in | Wt 98.0 lb

## 2022-12-30 DIAGNOSIS — Z0000 Encounter for general adult medical examination without abnormal findings: Secondary | ICD-10-CM | POA: Diagnosis not present

## 2022-12-30 NOTE — Patient Instructions (Signed)
[name] , Thank you for taking time to come for your Medicare Wellness Visit. I appreciate your ongoing commitment to your health goals. Please review the following plan we discussed and let me know if I can assist you in the future.   These are the goals we discussed:  Goals       "I want to keep my back pain under control" (pt-stated)      Current Barriers:  Chronic Disease Management support and education needs related to chronic back pain  Nurse Case Manager Clinical Goal(s):  Over the next 30 days, patient will verbalize understanding of plan for management of chronic back pain Over the next 30 days, patient will meet with RN Care Manager to address plan for self management of chronic back pain  Interventions:  Advised patient to take medications as prescribed Provided education to patient re: positive effects of exercise and physical fitness on chronic back pain Discussed characteristics of current pain, current treatments, and prior treatments Recommended to consider physical therapy for abd/back strength training Patient Self Care Activities:  Self administers medications as prescribed Attends all scheduled provider appointments Calls provider office for new concerns or questions  Initial goal documentation       "I would like to be able to hear better" (pt-stated)      Current Barriers:  Film/video editor.  Chronic Disease Management support and education needs related to hearing loss and need for hearing aids  Nurse Case Manager Clinical Goal(s):  Over the next 30 days, patient will work with [place] to address needs related to obtaining hearing aids.   Interventions:  Chart Reviewed Talked with patient by phone Discussed hx of hearing loss Care guide referral for assistance with affordable hearing aids  Patient Self Care Activities:  Self administers medications as prescribed Attends all scheduled provider appointments Calls provider office for new  concerns or questions  Initial goal documentation       "I would like to control my blood sugar better" (pt-stated)      Current Barriers:  Chronic Disease Management support and education needs related to diabetes Cost associated with diabetes management Hearing loss  Nurse Case Manager Clinical Goal(s):  Over the next 90 days, patient will work with Dr Dorris Fetch to address needs related to diabetes management Over the next 30 days, patient will work with nutritionist/diabetes educator  Interventions:  Evaluation of current treatment plan related to diabetes and patient's adherence to plan as established by provider. Reviewed medications and discussed Lantus and Novolin 70/30 Patient is prescribed Lantus 25 units and injects it each morning States that she has 9 full pens of lantus left. I questioned if she is taking it as prescribed because at her visit with Dr Dorris Fetch last month she reported having 10 pens. Injecting 25 units daily one pen should last 12 days so she should have less than 8 full pens at this time Patient prefers to use Novolin 70/30 and not Lantus. Reiterated that Dr Dorris Fetch documented that he would switch her back to Novolin once she has used he current supply of Lantus Discussed Dr Liliane Channel recommendation to follow-up with Jearld Fenton for diabetes education. RN will coordinate appointment with Kieth Brightly Crumptom Discussed plans with patient for ongoing care management follow up and provided patient with direct contact information for care management team Reviewed scheduled/upcoming provider appointments including: Evelina Dun, FNP on 01/31/20 and Dr Dorris Fetch on 03/01/2020 Advised patient, providing education and rationale, to check cbg twice daily as  instructed by Dr Dorris Fetch and record. Take blood sugar meter and log to appointment on 2/19. Advised to reach out to Dr Dorris Fetch with blood sugar readings below 70 or above 200  Patient Self Care Activities:  Self administers medications as  prescribed Calls pharmacy for medication refills Calls provider office for new concerns or questions  Please see past updates related to this goal by clicking on the "Past Updates" button in the selected goal        DIET - INCREASE WATER INTAKE      Try to drink 6-8 glasses of water daily      Have 3 meals a day      Continue to have 3 balanced meals per day.       Quit Smoking      Barriers: Tobacco addiction  Nurse Care Manager Clinical Goals: Over the next 3 days, patient will decrease by half the amount of cigarettes she smokes.   Interventions:  Smoking cessation resources printed and will be mailed.   Patient Self Care Activities:  Self administers medications as prescribed Calls provider office for new concerns or questions  Initial goal documentation         This is a list of the screening recommended for you and due dates:  Health Maintenance  Topic Date Due   DTaP/Tdap/Td vaccine (1 - Tdap) Never done   Screening for Lung Cancer  08/25/2014   Zoster (Shingles) Vaccine (2 of 2) 11/11/2018   Cologuard (Stool DNA test)  03/04/2021   Eye exam for diabetics  06/28/2021   COVID-19 Vaccine (5 - 2023-24 season) 06/14/2022   Mammogram  01/17/2023   Complete foot exam   05/03/2023   Hemoglobin A1C  05/08/2023   Yearly kidney health urinalysis for diabetes  08/06/2023   Yearly kidney function blood test for diabetes  11/08/2023   Medicare Annual Wellness Visit  12/30/2023   DEXA scan (bone density measurement)  11/25/2025   Pneumonia Vaccine  Completed   Flu Shot  Completed   Hepatitis C Screening: USPSTF Recommendation to screen - Ages 18-79 yo.  Completed   HPV Vaccine  Aged Out    Advanced directives: Advance directive discussed with you today. I have provided a copy for you to complete at home and have notarized. Once this is complete please bring a copy in to our office so we can scan it into your chart.   Conditions/risks identified: Aim for 30 minutes of  exercise or brisk walking, 6-8 glasses of water, and 5 servings of fruits and vegetables each day.   Next appointment: Follow up in one year for your annual wellness visit    Preventive Care 65 Years and Older, Female Preventive care refers to lifestyle choices and visits with your health care provider that can promote health and wellness. What does preventive care include? A yearly physical exam. This is also called an annual well check. Dental exams once or twice a year. Routine eye exams. Ask your health care provider how often you should have your eyes checked. Personal lifestyle choices, including: Daily care of your teeth and gums. Regular physical activity. Eating a healthy diet. Avoiding tobacco and drug use. Limiting alcohol use. Practicing safe sex. Taking low-dose aspirin every day. Taking vitamin and mineral supplements as recommended by your health care provider. What happens during an annual well check? The services and screenings done by your health care provider during your annual well check will depend on your age, overall health, lifestyle  risk factors, and family history of disease. Counseling  Your health care provider may ask you questions about your: Alcohol use. Tobacco use. Drug use. Emotional well-being. Home and relationship well-being. Sexual activity. Eating habits. History of falls. Memory and ability to understand (cognition). Work and work Statistician. Reproductive health. Screening  You may have the following tests or measurements: Height, weight, and BMI. Blood pressure. Lipid and cholesterol levels. These may be checked every 5 years, or more frequently if you are over 2 years old. Skin check. Lung cancer screening. You may have this screening every year starting at age 74 if you have a 30-pack-year history of smoking and currently smoke or have quit within the past 15 years. Fecal occult blood test (FOBT) of the stool. You may have this  test every year starting at age 25. Flexible sigmoidoscopy or colonoscopy. You may have a sigmoidoscopy every 5 years or a colonoscopy every 10 years starting at age 62. Hepatitis C blood test. Hepatitis B blood test. Sexually transmitted disease (STD) testing. Diabetes screening. This is done by checking your blood sugar (glucose) after you have not eaten for a while (fasting). You may have this done every 1-3 years. Bone density scan. This is done to screen for osteoporosis. You may have this done starting at age 38. Mammogram. This may be done every 1-2 years. Talk to your health care provider about how often you should have regular mammograms. Talk with your health care provider about your test results, treatment options, and if necessary, the need for more tests. Vaccines  Your health care provider may recommend certain vaccines, such as: Influenza vaccine. This is recommended every year. Tetanus, diphtheria, and acellular pertussis (Tdap, Td) vaccine. You may need a Td booster every 10 years. Zoster vaccine. You may need this after age 62. Pneumococcal 13-valent conjugate (PCV13) vaccine. One dose is recommended after age 32. Pneumococcal polysaccharide (PPSV23) vaccine. One dose is recommended after age 20. Talk to your health care provider about which screenings and vaccines you need and how often you need them. This information is not intended to replace advice given to you by your health care provider. Make sure you discuss any questions you have with your health care provider. Document Released: 10/27/2015 Document Revised: 06/19/2016 Document Reviewed: 08/01/2015 Elsevier Interactive Patient Education  2017 [employer] Prevention in the Home Falls can cause injuries. They can happen to people of all ages. There are many things you can do to make your home safe and to help prevent falls. What can I do on the outside of my home? Regularly fix the edges of walkways and  driveways and fix any cracks. Remove anything that might make you trip as you walk through a door, such as a raised step or threshold. Trim any bushes or trees on the path to your home. Use bright outdoor lighting. Clear any walking paths of anything that might make someone trip, such as rocks or tools. Regularly check to see if handrails are loose or broken. Make sure that both sides of any steps have handrails. Any raised decks and porches should have guardrails on the edges. Have any leaves, snow, or ice cleared regularly. Use sand or salt on walking paths during winter. Clean up any spills in your garage right away. This includes oil or grease spills. What can I do in the bathroom? Use night lights. Install grab bars by the toilet and in the tub and shower. Do not use towel bars as grab  bars. Use non-skid mats or decals in the tub or shower. If you need to sit down in the shower, use a plastic, non-slip stool. Keep the floor dry. Clean up any water that spills on the floor as soon as it happens. Remove soap buildup in the tub or shower regularly. Attach bath mats securely with double-sided non-slip rug tape. Do not have throw rugs and other things on the floor that can make you trip. What can I do in the bedroom? Use night lights. Make sure that you have a light by your bed that is easy to reach. Do not use any sheets or blankets that are too big for your bed. They should not hang down onto the floor. Have a firm chair that has side arms. You can use this for support while you get dressed. Do not have throw rugs and other things on the floor that can make you trip. What can I do in the kitchen? Clean up any spills right away. Avoid walking on wet floors. Keep items that you use a lot in easy-to-reach places. If you need to reach something above you, use a strong step stool that has a grab bar. Keep electrical cords out of the way. Do not use floor polish or wax that makes floors  slippery. If you must use wax, use non-skid floor wax. Do not have throw rugs and other things on the floor that can make you trip. What can I do with my stairs? Do not leave any items on the stairs. Make sure that there are handrails on both sides of the stairs and use them. Fix handrails that are broken or loose. Make sure that handrails are as long as the stairways. Check any carpeting to make sure that it is firmly attached to the stairs. Fix any carpet that is loose or worn. Avoid having throw rugs at the top or bottom of the stairs. If you do have throw rugs, attach them to the floor with carpet tape. Make sure that you have a light switch at the top of the stairs and the bottom of the stairs. If you do not have them, ask someone to add them for you. What else can I do to help prevent falls? Wear shoes that: Do not have high heels. Have rubber bottoms. Are comfortable and fit you well. Are closed at the toe. Do not wear sandals. If you use a stepladder: Make sure that it is fully opened. Do not climb a closed stepladder. Make sure that both sides of the stepladder are locked into place. Ask someone to hold it for you, if possible. Clearly mark and make sure that you can see: Any grab bars or handrails. First and last steps. Where the edge of each step is. Use tools that help you move around (mobility aids) if they are needed. These include: Canes. Walkers. Scooters. Crutches. Turn on the lights when you go into a dark area. Replace any light bulbs as soon as they burn out. Set up your furniture so you have a clear path. Avoid moving your furniture around. If any of your floors are uneven, fix them. If there are any pets around you, be aware of where they are. Review your medicines with your doctor. Some medicines can make you feel dizzy. This can increase your chance of falling. Ask your doctor what other things that you can do to help prevent falls. This information is not  intended to replace advice given to you by your  health care provider. Make sure you discuss any questions you have with your health care provider. Document Released: 07/27/2009 Document Revised: 03/07/2016 Document Reviewed: 11/04/2014 Elsevier Interactive Patient Education  2017 [employer].

## 2022-12-30 NOTE — Progress Notes (Signed)
Subjective:   Jamie [name] is a 71 y.o. female who presents for Medicare Annual (Subsequent) preventive examination. I connected with  Paulita Fujita on 12/30/22 by a audio enabled telemedicine application and verified that I am speaking with the correct person using two identifiers.  Patient Location: Home  Provider Location: Home Office  I discussed the limitations of evaluation and management by telemedicine. The patient expressed understanding and agreed to proceed.  Review of Systems     Cardiac Risk Factors include: advanced age (>43men, >62 women);diabetes mellitus;hypertension     Objective:    Today's Vitals   12/30/22 1448  Weight: 98 lb (44.5 kg)  Height: 5\' 6"  (1.676 m)   Body mass index is 15.82 kg/m.     12/30/2022    2:53 PM 12/12/2021    4:00 PM 12/11/2020    1:21 PM 11/26/2019    1:21 PM 02/26/2019    2:27 PM 02/17/2019   10:11 AM 09/16/2018    2:54 PM  Advanced Directives  Does Patient Have a Medical Advance Directive? Yes No No No No No No  Type of Paramedic of [place];Living will        Does patient want to make changes to medical advance directive?  No - Patient declined     Yes (MAU/Ambulatory/Procedural Areas - Information given)  Copy of [place] in Chart? No - copy requested        Would patient like information on creating a medical advance directive?  No - Patient declined No - Patient declined No - Patient declined No - Patient declined  Yes (MAU/Ambulatory/Procedural Areas - Information given)    Current Medications (verified) Outpatient Encounter Medications as of 12/30/2022  Medication Sig   Accu-Chek Softclix Lancets lancets Use to check blood sugar two times daily  Dx E11.9   albuterol (PROVENTIL) (2.5 MG/3ML) 0.083% nebulizer solution Take 3 mLs (2.5 mg total) by nebulization every 6 (six) hours as needed for wheezing or shortness of breath.   Alcohol Swabs (B-D SINGLE USE SWABS REGULAR) PADS Test  BS BID Dx E11.9   amLODipine (NORVASC) 5 MG tablet TAKE 1 TABLET EVERY DAY   aspirin EC 81 MG tablet Take 1 tablet (81 mg total) by mouth daily. Swallow whole.   Baclofen 5 MG TABS TAKE 1 TABLET IN THE MORNING AND AT BEDTIME.   Blood Glucose Calibration (ACCU-CHEK AVIVA) SOLN Use with glucose machine Dx E11.9   Blood Glucose Monitoring Suppl (ACCU-CHEK AVIVA PLUS) w/Device KIT Check BS BID Dx E11.9   donepezil (ARICEPT) 10 MG tablet Take 1 tablet (10 mg total) by mouth at bedtime.   Fluticasone-Umeclidin-Vilant (TRELEGY ELLIPTA) 100-62.5-25 MCG/ACT AEPB Inhale 1 puff into the lungs daily.   gabapentin (NEURONTIN) 300 MG capsule Take 1 capsule (300 mg total) by mouth 3 (three) times daily.   glucose blood (ACCU-CHEK GUIDE) test strip Use to check blood sugar two times daily  Dx E11.9   insulin glargine (LANTUS SOLOSTAR) 100 UNIT/ML Solostar Pen INJECT 28 UNITS INTO THE SKIN DAILY WITH BREAKFAST.   Insulin Pen Needle (DROPLET PEN NEEDLES) 32G X 5 MM MISC Use with insulin daily Dx E11.9   losartan (COZAAR) 100 MG tablet TAKE 1 TABLET EVERY DAY   memantine (NAMENDA) 10 MG tablet Take 1 tablet (10 mg total) by mouth 2 (two) times daily.   pravastatin (PRAVACHOL) 40 MG tablet TAKE 1 TABLET EVERY DAY   No facility-administered encounter medications on file as of  12/30/2022.    Allergies (verified) Lipitor [atorvastatin] and Soma [carisoprodol]   History: Past Medical History:  Diagnosis Date   Anxiety    Breast cancer ([place])    COPD (chronic obstructive pulmonary disease) ([place])    Deaf    Depression    Diabetes ([place])    HOH (hard of hearing)    HTN (hypertension)    Hyperlipidemia    Personal history of radiation therapy    Vitamin D deficiency    Past Surgical History:  Procedure Laterality Date   APPLICATION OF ROBOTIC ASSISTANCE FOR SPINAL PROCEDURE N/A 02/23/2019   Procedure: APPLICATION OF ROBOTIC ASSISTANCE FOR SPINAL PROCEDURE;  Surgeon: Kristeen Miss, MD;  Location: [place];   Service: Neurosurgery;  Laterality: N/A;   BACK SURGERY     BREAST SURGERY     radiation   CARPAL TUNNEL RELEASE     CERVICAL SPINE SURGERY     EYE SURGERY Bilateral    cataracts   GALLBLADDER SURGERY     INNER EAR SURGERY     POSTERIOR LUMBAR FUSION 4 LEVEL N/A 02/23/2019   Procedure: Thoracic ten to Lumbar two decompression with Posterior lumbar interbody fusion lumbar one-lumbar two with Osteotomy at Lumbar one-Lumbar two, Posterior arthrodesis Thoracic ten to Lumbar two with Mazor;  Surgeon: Kristeen Miss, MD;  Location: [place];  Service: Neurosurgery;  Laterality: N/A;   SPINE SURGERY     Family History  Problem Relation Age of Onset   Heart attack Mother    Stroke Mother    Stroke Sister    Diabetes Other        sister x3, and mother   Lung disease Father        worked in Company secretary cancer Neg Hx    Colon polyps Neg Hx    Social History   Socioeconomic History   Marital status: Married    Spouse name: Sonia Side   Number of children: 1   Years of education: 12   Highest education level: High school graduate  Occupational History   Occupation: disability  Tobacco Use   Smoking status: Every Day    Packs/day: 1.00    Years: 51.00    Additional pack years: 0.00    Total pack years: 51.00    Types: Cigarettes   Smokeless tobacco: Never  Vaping Use   Vaping Use: Never used  Substance and Sexual Activity   Alcohol use: No   Drug use: No   Sexual activity: Not Currently    Birth control/protection: None  Other Topics Concern   Not on file  Social History Narrative   Retired/disabled. Lives at home with her husband. He still works part-time. Currently following social distancing guidelines.    Social Determinants of Health   Financial Resource Strain: Low Risk  (12/30/2022)   Overall Financial Resource Strain (CARDIA)    Difficulty of Paying Living Expenses: Not hard at all  Food Insecurity: No Food Insecurity (12/30/2022)   Hunger Vital Sign    Worried  About Running Out of Food in the Last Year: Never true    Ran Out of Food in the Last Year: Never true  Transportation Needs: No Transportation Needs (12/30/2022)   PRAPARE - Hydrologist (Medical): No    Lack of Transportation (Non-Medical): No  Physical Activity: Insufficiently Active (12/30/2022)   Exercise Vital Sign    Days of Exercise per Week: 3 days    Minutes of Exercise  per Session: 30 min  Stress: No Stress Concern Present (12/30/2022)   [place]    Feeling of Stress : Only a little  Social Connections: Moderately Integrated (12/30/2022)   Social Connection and Isolation Panel [NHANES]    Frequency of Communication with Friends and Family: More than three times a week    Frequency of Social Gatherings with Friends and Family: More than three times a week    Attends Religious Services: More than 4 times per year    Active Member of [employer] or Organizations: No    Attends Music therapist: Never    Marital Status: Married    Tobacco Counseling Ready to quit: No Counseling given: Not Answered   Clinical Intake:  Pre-visit preparation completed: Yes  Pain : No/denies pain     Nutritional Risks: None Diabetes: Yes CBG done?: No Did pt. bring in CBG monitor from home?: No  How often do you need to have someone help you when you read instructions, pamphlets, or other written materials from your doctor or pharmacy?: 1 - Never  Diabetic?yes  Nutrition Risk Assessment:  Has the patient had any N/V/D within the last 2 months?  No  Does the patient have any non-healing wounds?  No  Has the patient had any unintentional weight loss or weight gain?  No   Diabetes:  Is the patient diabetic?  Yes  If diabetic, was a CBG obtained today?  No  Did the patient bring in their glucometer from home?  No  How often do you monitor your CBG's? Once a day .   Financial  Strains and Diabetes Management:  Are you having any financial strains with the device, your supplies or your medication? No .  Does the patient want to be seen by Chronic Care Management for management of their diabetes?  No  Would the patient like to be referred to a Nutritionist or for Diabetic Management?  No   Diabetic Exams:  Diabetic Eye Exam: Overdue for diabetic eye exam. Pt has been advised about the importance in completing this exam. Patient advised to call and schedule an eye exam. Diabetic Foot Exam: Overdue, Pt has been advised about the importance in completing this exam. Pt is scheduled for diabetic foot exam on next office visit .   Interpreter Needed?: No  Information entered by :: Jadene Pierini, LPN   Activities of Daily Living    12/30/2022    2:53 PM  In your present state of health, do you have any difficulty performing the following activities:  Hearing? 0  Vision? 0  Difficulty concentrating or making decisions? 0  Walking or climbing stairs? 0  Dressing or bathing? 0  Doing errands, shopping? 0  Preparing Food and eating ? N  Using the Toilet? N  In the past six months, have you accidently leaked urine? N  Do you have problems with loss of bowel control? N  Managing your Medications? N  Managing your Finances? N  Housekeeping or managing your Housekeeping? N    Patient Care Team: Sharion Balloon, FNP as PCP - General (Nurse Practitioner) Kristeen Miss, MD as Consulting Physician (Neurosurgery)  Indicate any recent Medical Services you may have received from other than [facility] providers in the past year (date may be approximate).     Assessment:   This is a routine wellness examination for [place].  Hearing/Vision screen Vision Screening - Comments:: Wears rx glasses - up to  date with routine eye exams with  Dr,Johnson   Dietary issues and exercise activities discussed: Current Exercise Habits: Home exercise routine, Type of exercise: walking,  Time (Minutes): 30, Frequency (Times/Week): 3, Weekly Exercise (Minutes/Week): 90, Intensity: Mild, Exercise limited by: orthopedic condition(s)   Goals Addressed             This Visit's Progress    DIET - INCREASE WATER INTAKE   On track    Try to drink 6-8 glasses of water daily       Depression Screen    12/30/2022    2:52 PM 11/07/2022    9:39 AM 05/02/2022   11:45 AM 12/12/2021    4:03 PM 09/26/2021   10:46 AM 04/11/2021   11:52 AM 12/19/2020   11:07 AM  PHQ 2/9 Scores  PHQ - 2 Score 0 3 6 1 1 6  0  PHQ- 9 Score 0 17 24   18      Fall Risk    12/30/2022    2:51 PM 11/07/2022    9:39 AM 08/05/2022   11:22 AM 05/02/2022   11:45 AM 12/25/2021   10:30 AM  Fall Risk   Falls in the past year? 1 0 1 0 1  Number falls in past yr: 1  1  0  Injury with Fall? 1  1  1   Risk for fall due to : History of fall(s);Impaired balance/gait;Orthopedic patient No Fall Risks Impaired balance/gait  History of fall(s)  Follow up Education provided;Falls prevention discussed Falls evaluation completed;Education provided Falls evaluation completed  Falls evaluation completed    FALL RISK PREVENTION PERTAINING TO THE HOME:  Any stairs in or around the home? No  If so, are there any without handrails? No  Home free of loose throw rugs in walkways, pet beds, electrical cords, etc? Yes  Adequate lighting in your home to reduce risk of falls? Yes   ASSISTIVE DEVICES UTILIZED TO PREVENT FALLS:  Life alert? No  Use of a cane, walker or w/c? No  Grab bars in the bathroom? No  Shower chair or bench in shower? No  Elevated toilet seat or a handicapped toilet? No          12/30/2022    2:53 PM 12/12/2021    3:45 PM 12/11/2020    1:23 PM 11/26/2019    1:27 PM 09/16/2018    2:56 PM  6CIT Screen  What Year? 0 points 0 points 0 points 0 points 0 points  What month? 0 points 0 points 0 points 0 points 0 points  What time? 0 points 0 points 0 points 0 points 0 points  Count back from 20 0 points 0  points 0 points 0 points 0 points  Months in reverse 0 points 4 points 0 points 2 points 2 points  Repeat phrase 0 points 6 points 2 points 0 points 0 points  Total Score 0 points 10 points 2 points 2 points 2 points    Immunizations Immunization History  Administered Date(s) Administered   Fluad Quad(high Dose 65+) 07/26/2019, 07/28/2020, 07/10/2021, 08/05/2022   Influenza Split 08/14/2012, 07/17/2013   Influenza, High Dose Seasonal PF 07/28/2018   Influenza,inj,Quad PF,6+ Mos 07/11/2014, 07/14/2015, 07/15/2016, 07/22/2017   Moderna Covid-19 Vaccine Bivalent Booster 97yrs & up 08/14/2021   Moderna Sars-Covid-2 Vaccination 12/09/2019, 01/07/2020, 10/11/2020   Pneumococcal Conjugate-13 02/13/2015   Pneumococcal Polysaccharide-23 10/14/2002, 01/29/2018   Zoster Recombinat (Shingrix) 09/16/2018    TDAP status: Due, Education has been provided regarding the importance  of this vaccine. Advised may receive this vaccine at local pharmacy or Health Dept. Aware to provide a copy of the vaccination record if obtained from local pharmacy or Health Dept. Verbalized acceptance and understanding.  Flu Vaccine status: Up to date  Pneumococcal vaccine status: Up to date  Covid-19 vaccine status: Completed vaccines  Qualifies for Shingles Vaccine? Yes   Zostavax completed Yes   Shingrix Completed?: Yes  Screening Tests Health Maintenance  Topic Date Due   DTaP/Tdap/Td (1 - Tdap) Never done   Lung Cancer Screening  08/25/2014   Zoster Vaccines- Shingrix (2 of 2) 11/11/2018   Fecal DNA (Cologuard)  03/04/2021   OPHTHALMOLOGY EXAM  06/28/2021   COVID-19 Vaccine (5 - 2023-24 season) 06/14/2022   MAMMOGRAM  01/17/2023   FOOT EXAM  05/03/2023   HEMOGLOBIN A1C  05/08/2023   Diabetic kidney evaluation - Urine ACR  08/06/2023   Diabetic kidney evaluation - eGFR measurement  11/08/2023   Medicare Annual Wellness (AWV)  12/30/2023   DEXA SCAN  11/25/2025   Pneumonia Vaccine 69+ Years old   Completed   INFLUENZA VACCINE  Completed   Hepatitis C Screening  Completed   HPV VACCINES  Aged Out    Health Maintenance  Health Maintenance Due  Topic Date Due   DTaP/Tdap/Td (1 - Tdap) Never done   Lung Cancer Screening  08/25/2014   Zoster Vaccines- Shingrix (2 of 2) 11/11/2018   Fecal DNA (Cologuard)  03/04/2021   OPHTHALMOLOGY EXAM  06/28/2021   COVID-19 Vaccine (5 - 2023-24 season) 06/14/2022    Colorectal cancer screening: Referral to GI placed declined . Pt aware the office will call re: appt.  Mammogram status: Completed 01/17/2023. Repeat every year  Bone Density status: Completed 11/25/2022. Results reflect: Bone density results: OSTEOPOROSIS. Repeat every 3 years.  Lung Cancer Screening: (Low Dose CT Chest recommended if Age 66-80 years, 30 pack-year currently smoking OR have quit w/in 15years.) does not qualify.   Lung Cancer Screening Referral: n/a  Additional Screening:  Hepatitis C Screening: does not qualify; Completed 10/10/2015  Vision Screening: Recommended annual ophthalmology exams for early detection of glaucoma and other disorders of the eye. Is the patient up to date with their annual eye exam?  Yes  Who is the provider or what is the name of the office in which the patient attends annual eye exams? Dr.Johnson  If pt is not established with a provider, would they like to be referred to a provider to establish care? No .   Dental Screening: Recommended annual dental exams for proper oral hygiene  Community Resource Referral / Chronic Care Management: CRR required this visit?  No   CCM required this visit?  No      Plan:     I have personally reviewed and noted the following in the patient's chart:   Medical and social history Use of alcohol, tobacco or illicit drugs  Current medications and supplements including opioid prescriptions. Patient is not currently taking opioid prescriptions. Functional ability and status Nutritional  status Physical activity Advanced directives List of other physicians Hospitalizations, surgeries, and ER visits in previous 12 months Vitals Screenings to include cognitive, depression, and falls Referrals and appointments  In addition, I have reviewed and discussed with patient certain preventive protocols, quality metrics, and best practice recommendations. A written personalized care plan for preventive services as well as general preventive health recommendations were provided to patient.     Daphane Shepherd, LPN   [id number]  Nurse Notes: Due Tdap vaccine completed with Husband Sonia Side Help due to patient having hearing difficulties

## 2023-01-03 ENCOUNTER — Encounter: Payer: Self-pay | Admitting: Family

## 2023-01-03 ENCOUNTER — Other Ambulatory Visit: Payer: Medicare HMO

## 2023-01-03 ENCOUNTER — Ambulatory Visit (INDEPENDENT_AMBULATORY_CARE_PROVIDER_SITE_OTHER): Payer: Medicare HMO | Admitting: Family

## 2023-01-03 VITALS — BP 174/81 | HR 84 | Temp 98.3°F | Ht 66.0 in | Wt 95.0 lb

## 2023-01-03 DIAGNOSIS — I10 Essential (primary) hypertension: Secondary | ICD-10-CM

## 2023-01-03 DIAGNOSIS — M5416 Radiculopathy, lumbar region: Secondary | ICD-10-CM | POA: Diagnosis not present

## 2023-01-03 DIAGNOSIS — Z01811 Encounter for preprocedural respiratory examination: Secondary | ICD-10-CM

## 2023-01-03 DIAGNOSIS — J449 Chronic obstructive pulmonary disease, unspecified: Secondary | ICD-10-CM | POA: Diagnosis not present

## 2023-01-03 DIAGNOSIS — E1159 Type 2 diabetes mellitus with other circulatory complications: Secondary | ICD-10-CM | POA: Diagnosis not present

## 2023-01-03 MED ORDER — AMLODIPINE BESYLATE 10 MG PO TABS: 10.0000 mg | ORAL_TABLET | Freq: Every day | ORAL | 2 refills | Status: DC

## 2023-01-03 NOTE — Progress Notes (Signed)
Subjective:    Patient ID: Jamie [name], female    DOB: 1951/02/09, 72 y.o.   MRN: [id number]  Chief Complaint  Patient presents with   Medical Management of Chronic Issues   PT presents to the office today for surgical clearance.  PT had surgical decompression and stabilization from L2-L4 on 02/05/17. Pt states her back continues to hurt and her pain is a 8 out 10 when she walks or standing. Has surgery with Neurosurgeon pending.      She has CAD and takes statin daily.   She has COPD and has SOB and wheezing. She continues to smoke less than a pack a day.    Husband reports memory changes.  Diabetes Hypertension This is a chronic problem. The current episode started more than 1 year ago. The problem has been waxing and waning since onset. The problem is uncontrolled. Associated symptoms include malaise/fatigue and shortness of breath. Pertinent negatives include no peripheral edema. Risk factors for coronary artery disease include dyslipidemia, diabetes mellitus, sedentary lifestyle and smoking/tobacco exposure. The current treatment provides mild improvement.  Diabetes She presents for her follow-up diabetic visit. She has type 2 diabetes mellitus. Associated symptoms include foot paresthesias. Symptoms are stable. Risk factors for coronary artery disease include dyslipidemia, diabetes mellitus, hypertension, sedentary lifestyle and post-menopausal. She is following a generally healthy diet. Her overall blood glucose range is 130-140 mg/dl.  Back Pain This is a chronic problem. The current episode started more than 1 year ago. The problem occurs intermittently. The problem has been waxing and waning since onset. The pain is present in the lumbar spine. The pain is at a severity of 8/10. The pain is severe. The pain is Worse during the night. She has tried analgesics and home exercises for the symptoms. The treatment provided mild relief.  Nicotine Dependence Presents for follow-up  visit. Her urge triggers include company of smokers. The symptoms have been stable. She smokes < 1/2 a pack of cigarettes per day.      Review of Systems  Constitutional:  Positive for malaise/fatigue.  Respiratory:  Positive for shortness of breath.   Musculoskeletal:  Positive for back pain.  All other systems reviewed and are negative.      Objective:   Physical Exam Vitals reviewed.  Constitutional:      General: She is not in acute distress.    Appearance: She is well-developed.  HENT:     Head: Normocephalic and atraumatic.  Eyes:     Pupils: Pupils are equal, round, and reactive to light.  Neck:     Thyroid: No thyromegaly.  Cardiovascular:     Rate and Rhythm: Normal rate and regular rhythm.     Heart sounds: Normal heart sounds. No murmur heard. Pulmonary:     Effort: Pulmonary effort is normal. No respiratory distress.     Breath sounds: Wheezing and rhonchi present.  Abdominal:     General: Bowel sounds are normal. There is no distension.     Palpations: Abdomen is soft.     Tenderness: There is no abdominal tenderness.  Musculoskeletal:        General: No tenderness. Normal range of motion.     Cervical back: Normal range of motion and neck supple.  Skin:    General: Skin is warm and dry.  Neurological:     Mental Status: She is alert and oriented to person, place, and time.     Cranial Nerves: No cranial nerve deficit.  Deep Tendon Reflexes: Reflexes are normal and symmetric.  Psychiatric:        Behavior: Behavior normal.        Thought Content: Thought content normal.        Judgment: Judgment normal.          BP (!) 174/81   Pulse 84   Temp 98.3 F (36.8 C) (Temporal)   Ht 5\' 6"  (1.676 m)   Wt 95 lb (43.1 kg)   SpO2 93%   BMI 15.33 kg/m   Assessment & Plan:  Jamie [name] comes in today with chief complaint of Medical Management of Chronic Issues   Diagnosis and orders addressed:  1. Pre-op chest exam - EKG 12-Lead -  CMP14+EGFR - CBC with Differential/Platelet  2. Moderate COPD (chronic obstructive pulmonary disease) (HCC) - Ambulatory referral to Pulmonology - CMP14+EGFR - CBC with Differential/Platelet - DG Chest 2 View  3. Essential hypertension, benign - CMP14+EGFR - CBC with Differential/Platelet  4. Lumbar radiculopathy, chronic - CMP14+EGFR - CBC with Differential/Platelet  5. DM type 2 causing vascular disease ([place]) - CMP14+EGFR - CBC with Differential/Platelet   Labs pending Referral to Pulmonologist pending for surgical clearance given COPD  Health Maintenance reviewed Diet and exercise encouraged  Follow up plan: 2 weeks for HTN   Evelina Dun, FNP

## 2023-01-03 NOTE — Progress Notes (Signed)
[facility] Quality Team Note  Name: Jamie [name] Date of Birth: 1951-08-23 MRN: [id number] Date: 01/03/2023  [facility] Quality Team has reviewed this patient's chart, please see recommendations below:  [facility] Quality Other; (Called patient to offer [place] retinal eye screening event 01/16/2023, patient states she has an appointment today at 2:00p with Dr Lenna Gilford and would like to discuss the event then since her husband will be with her.)

## 2023-01-03 NOTE — Patient Instructions (Signed)
Chronic Obstructive Pulmonary Disease  Chronic obstructive pulmonary disease (COPD) is a long-term (chronic) condition that affects the lungs. COPD is a general term that can be used to describe many different lung problems that cause lung inflammation and limit airflow, including chronic bronchitis and emphysema. If you have COPD, your lung function will probably never return to normal. In most cases, it gets worse over time. However, there are steps you can take to slow the progression of the disease and improve your quality of life. What are the causes? This condition may be caused by: Smoking. This is the most common cause. Certain genes passed down through families. What increases the risk? The following factors may make you more likely to develop this condition: Being exposed to secondhand smoke from cigarettes, pipes, or cigars. Being exposed to chemicals and other irritants, such as fumes and dust in the work environment. Having chronic lung conditions or infections. What are the signs or symptoms? Symptoms of this condition include: Shortness of breath, especially during physical activity. Chronic cough with a large amount of thick mucus. Sometimes, the cough may not have any mucus (dry cough). Wheezing and rapid breathing. Gray or bluish discoloration (cyanosis) of the skin, especially in the fingers, toes, or lips. Feeling tired (fatigue). Weight loss. Chest tightness. Frequent infections. Episodes when breathing symptoms become much worse (exacerbations). At the later stages of this disease, you may have swelling in the ankles, feet, or legs. How is this diagnosed? This condition is diagnosed based on: Your medical history. A physical exam. You may also have tests, including: Lung (pulmonary) function tests. This may include a spirometry test, which measures your ability to exhale properly. Chest X-ray. CT scan. Blood tests. How is this treated? This condition may be  treated with: Medicines. These may include inhaled rescue medicines to treat acute exacerbations as well as medicines that you take long-term (maintenance medicines) to prevent flare-ups of COPD. Bronchodilators help treat COPD by dilating the airways to allow increased airflow and make your breathing more comfortable. Steroids can reduce airway inflammation and help prevent exacerbations. Smoking cessation. If you smoke, your health care provider may ask you to quit, and may also recommend therapy or replacement products to help you quit. Pulmonary rehabilitation. This may involve working with a team of health care providers and specialists, such as respiratory, occupational, and physical therapists. Exercise and physical activity. These are beneficial for nearly all people with COPD. Nutrition therapy to gain weight, if you are underweight. Oxygen. Supplemental oxygen therapy is only helpful if you have a low oxygen level in your blood (hypoxemia). Lung surgery or transplant. Palliative care. This is to help people with COPD feel comfortable when treatment is no longer working. Follow these instructions at home: Medicines Take over-the-counter and prescription medicines only as told by your health care provider. This includes inhaled medicines and pills. Talk to your health care provider before taking any cough or allergy medicines. You may need to avoid certain medicines that dry out your airways. Lifestyle If you smoke, the most important thing that you can do is to stop smoking. Continuing to smoke will cause the disease to progress faster. Do not use any products that contain nicotine or tobacco. These products include cigarettes, chewing tobacco, and vaping devices, such as e-cigarettes. If you need help quitting, ask your health care provider. Avoid exposure to things that irritate your lungs, such as smoke, chemicals, and fumes. Stay active, but balance activity with periods of rest.  Exercise and   physical activity will help you maintain your ability to do things you want to do. Learn and use relaxation techniques to manage stress and to control your breathing. Get the right amount of sleep and get quality sleep. Most adults need 7 or more hours per night. Eat healthy foods. Eating smaller, more frequent meals and resting before meals may help you maintain your strength. Controlled breathing Learn and use controlled breathing techniques as directed by your health care provider. Controlled breathing techniques include: Pursed lip breathing. Start by breathing in (inhaling) through your nose for 1 second. Then, purse your lips as if you were going to whistle and breathe out (exhale) through the pursed lips for 2 seconds. Diaphragmatic breathing. Start by putting one hand on your abdomen just above your waist. Inhale slowly through your nose. The hand on your abdomen should move out. Then purse your lips and exhale slowly. You should be able to feel the hand on your abdomen moving in as you exhale.  Controlled coughing Learn and use controlled coughing to clear mucus from your lungs. Controlled coughing is a series of short, progressive coughs. The steps of controlled coughing are: Lean your head slightly forward. Breathe in deeply using diaphragmatic breathing. Try to hold your breath for 3 seconds. Keep your mouth slightly open while coughing twice. Spit any mucus out into a tissue. Rest and repeat the steps once or twice as needed. General instructions Make sure you receive all the vaccines that your health care provider recommends, especially the pneumococcal and influenza vaccines. Preventing infection and hospitalization is very important when you have COPD. Drink enough fluid to keep your urine pale yellow, unless you have a medical condition that requires fluid restriction. Use oxygen therapy and pulmonary rehabilitation if told by your health care provider. If you  require home oxygen therapy, ask your health care provider whether you should purchase a pulse oximeter to measure your oxygen level at home. Work with your health care provider to develop a COPD action plan. This will help you know what steps to take if your condition gets worse. Keep other chronic health conditions under control as told by your health care provider. Avoid extreme temperature and humidity changes. Avoid contact with people who have an illness that spreads from person to person (is contagious), such as viral infections or pneumonia. Keep all follow-up visits. This is important. Contact a health care provider if: You are coughing up more mucus than usual. There is a change in the color or thickness of your mucus. Your breathing is more labored than usual. Your breathing is faster than usual. You have difficulty sleeping. You need to use your rescue medicines or inhalers more often than expected. You have trouble doing routine activities such as getting dressed or walking around the house. Get help right away if: You have shortness of breath while you are resting. You have shortness of breath that prevents you from: Being able to talk. Performing your usual physical activities. You have chest pain lasting longer than 5 minutes. Your skin color is more blue (cyanotic) than usual. You measure low oxygen saturations for longer than 5 minutes with a pulse oximeter. You have a fever. You feel too tired to breathe normally. These symptoms may represent a serious problem that is an emergency. Do not wait to see if the symptoms will go away. Get medical help right away. Call your local emergency services (911 in the [place] Do not drive yourself to the hospital. Summary Chronic obstructive   pulmonary disease (COPD) is a long-term (chronic) condition that affects the lungs. Your lung function will probably never return to normal. In most cases, it gets worse over time. However, there  are steps you can take to slow the progression of the disease and improve your quality of life. Treatment for COPD may include taking medicines, quitting smoking, pulmonary rehabilitation, and changes to diet and exercise. As the disease progresses, you may need oxygen therapy, a lung transplant, or palliative care. To help manage your condition, do not smoke, avoid exposure to things that irritate your lungs, stay up to date on all vaccines, and follow your health care provider's instructions for taking medicines. This information is not intended to replace advice given to you by your health care provider. Make sure you discuss any questions you have with your health care provider. Document Revised: 08/08/2020 Document Reviewed: 08/08/2020 Elsevier Patient Education  2023 [employer]  

## 2023-01-06 ENCOUNTER — Other Ambulatory Visit: Payer: Self-pay | Admitting: Family

## 2023-01-06 DIAGNOSIS — I10 Essential (primary) hypertension: Secondary | ICD-10-CM

## 2023-01-29 NOTE — Progress Notes (Deleted)
   Jamie [name], female    DOB: Jul 19, 1951    MRN: [id number]   Brief patient profile:  ***  yo*** *** referred to pulmonary clinic in [place]  01/30/2023 by *** for ***      History of Present Illness  01/30/2023  Pulmonary/ 1st office eval/ Sherene Sires / [place] [facility]  No chief complaint on file.    Dyspnea:  *** Cough: *** Sleep: *** SABA use: *** 02: *** Lung cancer screen: ***  Past Medical History:  Diagnosis Date   Anxiety    Breast cancer (HCC)    COPD (chronic obstructive pulmonary disease) (HCC)    Deaf    Depression    Diabetes (HCC)    HOH (hard of hearing)    HTN (hypertension)    Hyperlipidemia    Personal history of radiation therapy    Vitamin D deficiency     Outpatient Medications Prior to Visit  Medication Sig Dispense Refill   Accu-Chek Softclix Lancets lancets Use to check blood sugar two times daily  Dx E11.9 200 each 3   albuterol (PROVENTIL) (2.5 MG/3ML) 0.083% nebulizer solution Take 3 mLs (2.5 mg total) by nebulization every 6 (six) hours as needed for wheezing or shortness of breath. 150 mL 1   Alcohol Swabs (B-D SINGLE USE SWABS REGULAR) PADS Test BS BID Dx E11.9 200 each 3   amLODipine (NORVASC) 10 MG tablet Take 1 tablet (10 mg total) by mouth daily. 90 tablet 2   aspirin EC 81 MG tablet Take 1 tablet (81 mg total) by mouth daily. Swallow whole. 90 tablet 1   Baclofen 5 MG TABS TAKE 1 TABLET IN THE MORNING AND AT BEDTIME. 180 tablet 2   Blood Glucose Calibration (ACCU-CHEK AVIVA) SOLN Use with glucose machine Dx E11.9 1 each 0   Blood Glucose Monitoring Suppl (ACCU-CHEK AVIVA PLUS) w/Device KIT Check BS BID Dx E11.9 1 kit 0   donepezil (ARICEPT) 10 MG tablet Take 1 tablet (10 mg total) by mouth at bedtime. 90 tablet 1   Fluticasone-Umeclidin-Vilant (TRELEGY ELLIPTA) 100-62.5-25 MCG/ACT AEPB Inhale 1 puff into the lungs daily. 1 each 11   gabapentin (NEURONTIN) 300 MG capsule Take 1 capsule (300 mg total) by mouth 3 (three) times daily. 90  capsule 3   glucose blood (ACCU-CHEK GUIDE) test strip Use to check blood sugar two times daily  Dx E11.9 200 each 3   insulin glargine (LANTUS SOLOSTAR) 100 UNIT/ML Solostar Pen INJECT 28 UNITS INTO THE SKIN DAILY WITH BREAKFAST. 30 mL 0   Insulin Pen Needle (DROPLET PEN NEEDLES) 32G X 5 MM MISC Use with insulin daily Dx E11.9 100 each 3   losartan (COZAAR) 100 MG tablet TAKE 1 TABLET EVERY DAY 90 tablet 0   memantine (NAMENDA) 10 MG tablet Take 1 tablet (10 mg total) by mouth 2 (two) times daily. 180 tablet 1   pravastatin (PRAVACHOL) 40 MG tablet TAKE 1 TABLET EVERY DAY 90 tablet 0   No facility-administered medications prior to visit.     Objective:     There were no vitals taken for this visit.         Assessment   No problem-specific Assessment & Plan notes found for this encounter.     Sandrea Hughs, MD 01/29/2023

## 2023-01-30 ENCOUNTER — Institutional Professional Consult (permissible substitution): Payer: Medicare HMO | Admitting: Internal Medicine

## 2023-02-06 ENCOUNTER — Encounter: Payer: Self-pay | Admitting: Family

## 2023-02-06 ENCOUNTER — Ambulatory Visit (INDEPENDENT_AMBULATORY_CARE_PROVIDER_SITE_OTHER): Payer: Medicare HMO | Admitting: Family

## 2023-02-06 VITALS — BP 123/71 | HR 78 | Temp 97.2°F | Ht 66.0 in | Wt 95.0 lb

## 2023-02-06 DIAGNOSIS — H9193 Unspecified hearing loss, bilateral: Secondary | ICD-10-CM

## 2023-02-06 DIAGNOSIS — G300 Alzheimer's disease with early onset: Secondary | ICD-10-CM | POA: Diagnosis not present

## 2023-02-06 DIAGNOSIS — G8929 Other chronic pain: Secondary | ICD-10-CM

## 2023-02-06 DIAGNOSIS — M5441 Lumbago with sciatica, right side: Secondary | ICD-10-CM

## 2023-02-06 DIAGNOSIS — J449 Chronic obstructive pulmonary disease, unspecified: Secondary | ICD-10-CM | POA: Diagnosis not present

## 2023-02-06 DIAGNOSIS — K219 Gastro-esophageal reflux disease without esophagitis: Secondary | ICD-10-CM

## 2023-02-06 DIAGNOSIS — E1159 Type 2 diabetes mellitus with other circulatory complications: Secondary | ICD-10-CM

## 2023-02-06 DIAGNOSIS — F17200 Nicotine dependence, unspecified, uncomplicated: Secondary | ICD-10-CM | POA: Diagnosis not present

## 2023-02-06 DIAGNOSIS — I2584 Coronary atherosclerosis due to calcified coronary lesion: Secondary | ICD-10-CM | POA: Diagnosis not present

## 2023-02-06 DIAGNOSIS — R636 Underweight: Secondary | ICD-10-CM

## 2023-02-06 DIAGNOSIS — E559 Vitamin D deficiency, unspecified: Secondary | ICD-10-CM | POA: Diagnosis not present

## 2023-02-06 DIAGNOSIS — E43 Unspecified severe protein-calorie malnutrition: Secondary | ICD-10-CM

## 2023-02-06 DIAGNOSIS — I10 Essential (primary) hypertension: Secondary | ICD-10-CM

## 2023-02-06 DIAGNOSIS — E1169 Type 2 diabetes mellitus with other specified complication: Secondary | ICD-10-CM

## 2023-02-06 DIAGNOSIS — F02B11 Dementia in other diseases classified elsewhere, moderate, with agitation: Secondary | ICD-10-CM

## 2023-02-06 DIAGNOSIS — E785 Hyperlipidemia, unspecified: Secondary | ICD-10-CM

## 2023-02-06 DIAGNOSIS — I2510 Atherosclerotic heart disease of native coronary artery without angina pectoris: Secondary | ICD-10-CM

## 2023-02-06 LAB — CBC WITH DIFFERENTIAL/PLATELET
Basophils Absolute: 0 10*3/uL (ref 0.0–0.2)
Basos: 1 %
EOS (ABSOLUTE): 0.1 10*3/uL (ref 0.0–0.4)
Eos: 2 %
Hematocrit: 37.8 % (ref 34.0–46.6)
Hemoglobin: 12.5 g/dL (ref 11.1–15.9)
Immature Grans (Abs): 0 10*3/uL (ref 0.0–0.1)
Immature Granulocytes: 0 %
Lymphocytes Absolute: 1.1 10*3/uL (ref 0.7–3.1)
Lymphs: 17 %
MCH: 27 pg (ref 26.6–33.0)
MCHC: 33.1 g/dL (ref 31.5–35.7)
MCV: 82 fL (ref 79–97)
Monocytes Absolute: 0.5 10*3/uL (ref 0.1–0.9)
Monocytes: 8 %
Neutrophils Absolute: 4.4 10*3/uL (ref 1.4–7.0)
Neutrophils: 72 %
Platelets: 222 10*3/uL (ref 150–450)
RBC: 4.63 x10E6/uL (ref 3.77–5.28)
RDW: 13.2 % (ref 11.7–15.4)
WBC: 6.1 10*3/uL (ref 3.4–10.8)

## 2023-02-06 LAB — CMP14+EGFR
ALT: 15 IU/L (ref 0–32)
AST: 17 IU/L (ref 0–40)
Albumin/Globulin Ratio: 1.6 (ref 1.2–2.2)
Albumin: 4.1 g/dL (ref 3.9–4.9)
Alkaline Phosphatase: 103 IU/L (ref 44–121)
BUN/Creatinine Ratio: 21 (ref 12–28)
BUN: 19 mg/dL (ref 8–27)
Bilirubin Total: 0.3 mg/dL (ref 0.0–1.2)
CO2: 21 mmol/L (ref 20–29)
Calcium: 9.3 mg/dL (ref 8.7–10.3)
Chloride: 103 mmol/L (ref 96–106)
Creatinine, Ser: 0.91 mg/dL (ref 0.57–1.00)
Globulin, Total: 2.5 g/dL (ref 1.5–4.5)
Glucose: 147 mg/dL — ABNORMAL HIGH (ref 70–99)
Potassium: 4.5 mmol/L (ref 3.5–5.2)
Sodium: 139 mmol/L (ref 134–144)
Total Protein: 6.6 g/dL (ref 6.0–8.5)
eGFR: 68 mL/min/{1.73_m2} (ref >59)

## 2023-02-06 LAB — BAYER DCA HB A1C WAIVED: HB A1C (BAYER DCA - WAIVED): 6.2 % — ABNORMAL HIGH (ref 4.8–5.6)

## 2023-02-06 NOTE — Patient Instructions (Signed)

## 2023-02-06 NOTE — Progress Notes (Signed)
Subjective:    Patient ID: Jamie [name], female    DOB: 11-Mar-1951, 72 y.o.   MRN: [id number]  Chief Complaint  Patient presents with   Medical Management of Chronic Issues   PT presents to the office today for chronic follow up.  PT had surgical decompression and stabilization from L2-L4 on 02/05/17. Pt states her back continues to hurt and her pain is a 8 out 10 when she walks or standing. She saw Neurosurgeon.     She has CAD and takes statin daily.   She has COPD and has SOB and wheezing. She continues to smoke less than a pack a day.    Husband reports memory changes.  Hypertension This is a chronic problem. The current episode started more than 1 year ago. The problem has been resolved since onset. The problem is controlled. Associated symptoms include malaise/fatigue. Pertinent negatives include no blurred vision, peripheral edema or shortness of breath. Risk factors for coronary artery disease include dyslipidemia, diabetes mellitus, sedentary lifestyle and smoking/tobacco exposure. The current treatment provides moderate improvement.  Diabetes She presents for her follow-up diabetic visit. She has type 2 diabetes mellitus. Associated symptoms include foot paresthesias. Pertinent negatives for diabetes include no blurred vision. Symptoms are stable. She is following a generally healthy diet. Her overall blood glucose range is 110-130 mg/dl.  Gastroesophageal Reflux She complains of belching, heartburn and a hoarse voice. This is a chronic problem. The current episode started more than 1 year ago. The problem occurs occasionally. She has tried a PPI for the symptoms. The treatment provided moderate relief.  Hyperlipidemia This is a chronic problem. The current episode started more than 1 year ago. The problem is controlled. Recent lipid tests were reviewed and are normal. Pertinent negatives include no shortness of breath. Current antihyperlipidemic treatment includes statins. The  current treatment provides moderate improvement of lipids. Risk factors for coronary artery disease include dyslipidemia, diabetes mellitus, hypertension, a sedentary lifestyle and post-menopausal.  Back Pain This is a chronic problem. The current episode started more than 1 year ago. The problem occurs intermittently. The problem has been waxing and waning since onset. The pain is present in the lumbar spine. The quality of the pain is described as aching. The pain is at a severity of 10/10. The pain is moderate. She has tried bed rest for the symptoms. The treatment provided mild relief.  Nicotine Dependence Presents for follow-up visit. Her urge triggers include company of smokers. The symptoms have been stable. She smokes 1 pack of cigarettes per day.      Review of Systems  Constitutional:  Positive for malaise/fatigue.  HENT:  Positive for hoarse voice.   Eyes:  Negative for blurred vision.  Respiratory:  Negative for shortness of breath.   Gastrointestinal:  Positive for heartburn.  Musculoskeletal:  Positive for back pain.  All other systems reviewed and are negative.      Objective:   Physical Exam Vitals reviewed.  Constitutional:      General: She is not in acute distress.    Appearance: She is well-developed.  HENT:     Head: Normocephalic and atraumatic.     Right Ear: Tympanic membrane normal.     Left Ear: Tympanic membrane normal.  Eyes:     Pupils: Pupils are equal, round, and reactive to light.  Neck:     Thyroid: No thyromegaly.  Cardiovascular:     Rate and Rhythm: Normal rate and regular rhythm.     Heart  sounds: Normal heart sounds. No murmur heard. Pulmonary:     Effort: Pulmonary effort is normal. No respiratory distress.     Breath sounds: Normal breath sounds. No wheezing.  Abdominal:     General: Bowel sounds are normal. There is no distension.     Palpations: Abdomen is soft.     Tenderness: There is no abdominal tenderness.  Musculoskeletal:         General: No tenderness.     Cervical back: Normal range of motion and neck supple.     Comments: Pain in lumbar, walking at a 90 degrees  angle   Skin:    General: Skin is warm and dry.  Neurological:     Mental Status: She is alert and oriented to person, place, and time.     Cranial Nerves: No cranial nerve deficit.     Deep Tendon Reflexes: Reflexes are normal and symmetric.  Psychiatric:        Behavior: Behavior normal.        Thought Content: Thought content normal.        Judgment: Judgment normal.       BP 123/71   Pulse 78   Temp (!) 97.2 F (36.2 C) (Temporal)   Ht  (1.676 m)   Wt 95 lb (43.1 kg)   BMI 15.33 kg/m      Assessment & Plan:  Jamie [name] comes in today with chief complaint of Medical Management of Chronic Issues   Diagnosis and orders addressed:  1. DM type 2 causing vascular disease - Bayer DCA Hb A1c Waived - CMP14+EGFR - CBC with Differential/Platelet  2. Essential hypertension, benign - CMP14+EGFR - CBC with Differential/Platelet  3. Current smoker - CMP14+EGFR - CBC with Differential/Platelet  4. Chronic bilateral low back pain with right-sided sciatica - CMP14+EGFR - CBC with Differential/Platelet  5. Coronary artery calcification - CMP14+EGFR - CBC with Differential/Platelet  6. Vitamin D deficiency - CMP14+EGFR - CBC with Differential/Platelet  7. Underweight - CMP14+EGFR - CBC with Differential/Platelet  8. Protein-calorie malnutrition, severe - CMP14+EGFR - CBC with Differential/Platelet  9. Moderate early onset Alzheimer's dementia with agitation - CMP14+EGFR - CBC with Differential/Platelet  10. Moderate COPD (chronic obstructive pulmonary disease) (HCC) - CMP14+EGFR - CBC with Differential/Platelet  11. Hyperlipidemia associated with type 2 diabetes mellitus - CMP14+EGFR - CBC with Differential/Platelet  12. Bilateral hearing loss, unspecified hearing loss type - CMP14+EGFR - CBC with  Differential/Platelet  13. Gastroesophageal reflux disease without esophagitis - CMP14+EGFR - CBC with Differential/Platelet   Labs pending Continue medications  Recommend calling Neurosurgeon office for follow up appointment  Health Maintenance reviewed Diet and exercise encouraged  Follow up plan: 3 months   Jannifer Rodney, FNP

## 2023-02-07 ENCOUNTER — Other Ambulatory Visit: Payer: Self-pay | Admitting: Family

## 2023-02-07 DIAGNOSIS — E1159 Type 2 diabetes mellitus with other circulatory complications: Secondary | ICD-10-CM

## 2023-02-07 MED ORDER — LANTUS SOLOSTAR 100 UNIT/ML ~~LOC~~ SOPN: 25.0000 [IU] | PEN_INJECTOR | Freq: Every day | SUBCUTANEOUS | 0 refills | Status: DC

## 2023-03-03 ENCOUNTER — Telehealth: Payer: Self-pay | Admitting: Family

## 2023-03-03 DIAGNOSIS — Z0289 Encounter for other administrative examinations: Secondary | ICD-10-CM

## 2023-03-16 NOTE — Progress Notes (Unsigned)
Jamie [name], female    DOB: 17-Feb-1951    MRN: [id number]   Brief patient profile:  47  yowf  active smoker x 1/2 pack per day   referred to pulmonary clinic in [place]  03/17/2023 by Jannifer Rodney / [facility])   for back surgery clearance.     Saw Ramaswamy 2014 COPD SEVERE  - Spirometry   02/02/2013: Show very severe obstruction with FEV1 0.77 (34%)   ratio of 0.43. -  PFT 03/29/13: Show significant improvement and she is on Gold stage II COPD . fev1 1.38L/58%,POSt BD is 1.36L/57%, Ratio 58, FVC 2.3L/76%, Ratio 58, DLCO 13/62% - referred rehab April 2014   History of Present Illness  03/17/2023  Pulmonary/ 1st office eval/ [facility] / [facility] GOLD 2 copd with reversible component / no longer on any maint rx  Chief Complaint  Patient presents with   Consult    Pt consult for COPD and surgical clearance, she is extremely agitated and frustrated about being here. Currently smoking 0.5ppd. Husband denies that she has SOB but an increase in phlegm. Would not allow me to check vitals.   She has severe complaints about back pain.   Dyspnea:  still able to pick appropriate items off the [employer] shelf leaning on cart slow pace for several aisles s stopping  = MMRC3 = can't walk 100 yards even at a slow pace at a flat grade s stopping due to sob   Cough: congested sounding  Sleep: flat bed/ one pillow  SABA use: not using   No obvious day to day or daytime pattern/variability or assoc excess/ purulent sputum or mucus plugs or hemoptysis or cp or chest tightness, subjective wheeze or overt sinus or hb symptoms.   Sleeping  without nocturnal  or early am exacerbation  of respiratory  c/o's or need for noct saba. Also denies any obvious fluctuation of symptoms with weather or environmental changes or other aggravating or alleviating factors except as outlined above   No unusual exposure hx or h/o childhood pna/ asthma or knowledge of premature birth.  Current  Allergies, Complete Past Medical History, Past Surgical History, Family History, and Social History were reviewed in [employer] record.  ROS  The following are not active complaints unless bolded Hoarseness, sore throat, dysphagia, dental problems, itching, sneezing,  nasal congestion or discharge of excess mucus or purulent secretions, ear ache,   fever, chills, sweats, unintended wt loss or wt gain, classically pleuritic or exertional cp,  orthopnea pnd or arm/hand swelling  or leg swelling, presyncope, palpitations, abdominal pain, anorexia, nausea, vomiting, diarrhea  or change in bowel habits or change in bladder habits, change in stools or change in urine, dysuria, hematuria,  rash, arthralgias/ back pain , visual complaints, headache, numbness, weakness both legs  or ataxia or problems with walking or coordination,  change in mood or  memory/ hard to test due hearing deficits.               Past Medical History:  Diagnosis Date   Anxiety    Breast cancer (HCC)    COPD (chronic obstructive pulmonary disease) (HCC)    Deaf    Depression    Diabetes (HCC)    HOH (hard of hearing)    HTN (hypertension)    Hyperlipidemia    Personal history of radiation therapy    Vitamin D deficiency     Outpatient Medications Prior to Visit - - NOTE:  Unable to verify as accurately reflecting what pt takes    Medication Sig Dispense Refill   Accu-Chek Softclix Lancets lancets Use to check blood sugar two times daily  Dx E11.9 200 each 3   albuterol (PROVENTIL) (2.5 MG/3ML) 0.083% nebulizer solution Take 3 mLs (2.5 mg total) by nebulization every 6 (six) hours as needed for wheezing or shortness of breath. 150 mL 1   Alcohol Swabs (B-D SINGLE USE SWABS REGULAR) PADS Test BS BID Dx E11.9 200 each 3   amLODipine (NORVASC) 10 MG tablet Take 1 tablet (10 mg total) by mouth daily. 90 tablet 2   aspirin EC 81 MG tablet Take 1 tablet (81 mg total) by mouth daily. Swallow whole. 90  tablet 1   Baclofen 5 MG TABS TAKE 1 TABLET IN THE MORNING AND AT BEDTIME. 180 tablet 2   Blood Glucose Calibration (ACCU-CHEK AVIVA) SOLN Use with glucose machine Dx E11.9 1 each 0   Blood Glucose Monitoring Suppl (ACCU-CHEK AVIVA PLUS) w/Device KIT Check BS BID Dx E11.9 1 kit 0   donepezil (ARICEPT) 10 MG tablet Take 1 tablet (10 mg total) by mouth at bedtime. 90 tablet 1   Fluticasone-Umeclidin-Vilant (TRELEGY ELLIPTA) 100-62.5-25 MCG/ACT AEPB Not using   11   gabapentin (NEURONTIN) 300 MG capsule Take 1 capsule (300 mg total) by mouth 3 (three) times daily. 90 capsule 3   glucose blood (ACCU-CHEK GUIDE) test strip Use to check blood sugar two times daily  Dx E11.9 200 each 3   insulin glargine (LANTUS SOLOSTAR) 100 UNIT/ML Solostar Pen Inject 25 Units into the skin at bedtime. 30 mL 0   Insulin Pen Needle (DROPLET PEN NEEDLES) 32G X 5 MM MISC Use with insulin daily Dx E11.9 100 each 3   losartan (COZAAR) 100 MG tablet TAKE 1 TABLET EVERY DAY 90 tablet 0   memantine (NAMENDA) 10 MG tablet Take 1 tablet (10 mg total) by mouth 2 (two) times daily. 180 tablet 1   pravastatin (PRAVACHOL) 40 MG tablet TAKE 1 TABLET EVERY DAY 90 tablet 0   No facility-administered medications prior to visit.     Objective:     Ht 5\' 6"  (1.676 m)   Wt 95 lb (43.1 kg)   BMI 15.33 kg/m   SpO2:  (pt denied - extremely agitated)  Elderly wf walks bent over 45 degrees at waist and talks only about how much pain she's in, does not know why she's here   HEENT :  Oropharynx  clear      NECK :  without JVD/Nodes/TM/ nl carotid upstrokes bilaterally   LUNGS: no acc muscle use,  Mod barrel  contour chest wall with junky insp/exp rhonchi bilaterally  and  without cough on insp or exp maneuvers and mod  Hyperresonant  to  percussion bilaterally     CV:  RRR  no s3 or murmur or increase in P2, and no edema   ABD:  soft and nontender with pos mid insp Hoover's  in the supine position. No bruits or organomegaly  appreciated, bowel sounds nl  MS:   Ext warm / can't stand up straight/ walks on bent wobbly knees s , calf tenderness, cyanosis or clubbing  SKIN: warm and dry without lesions    NEURO:  alert appearing but not able to test sensorium due to severe hearing deficits/  with  no motor or cerebellar deficits apparent.         CXR PA and Lateral:   03/17/2023 :  I personally reviewed images and impression is as follows:     C/w Mild / mod copd with non-specific mild  increase in markings     Assessment   COPD GOLD 2/ AB Active smoker - Spirometry   02/02/2013: Show very severe obstruction with FEV1 0.77 (34%)   ratio of 0.43. -  PFT 03/29/13: Show significant improvement and she is on Gold stage II COPD . fev1 1.38L/58%,POSt BD is 1.36L/57%, Ratio 58, FVC 2.3L/76%, Ratio 58, DLCO 13/62% - 03/17/2023  After extensive coaching inhaler device,  effectiveness =    75% with DPI so resume trelegy 100 each am with neb prn q 4   She is very high risk for post op respiratory failure /TME related to underlying dementia and hearing deficits and unlikely to be cooperative post op as not cooperative pre op  (refused vital signs) so the only way I would proceed with elective surgery is:  No smoking x 2 weeks (she can't leave home to buy them so need fm help here) Resume trelegy 100 each am with prn saba neb esp just prior to leaving home for surgery and q 4h prn thereafter  Early mobilization  minimal sedation  PCCM f/u closely post op  EOL  discussion preop started today with husband who willing to acknowledge there is a limit to medical science in this setting, that we can still offer compassionate surgery and limited heroics post op in this setting ie no long term vent./ HD / pressors etc.         Each maintenance medication was reviewed in detail including emphasizing most importantly the difference between maintenance and prns and under what circumstances the prns are to be triggered using an action  plan format where appropriate.  Total time for H and P, chart review, counseling, reviewing neb/DPI  device(s) and generating customized AVS unique to this office visit / same day charting = 45 min           Sandrea Hughs, MD 03/17/2023

## 2023-03-17 ENCOUNTER — Ambulatory Visit (HOSPITAL_COMMUNITY)
Admission: RE | Admit: 2023-03-17 | Discharge: 2023-03-17 | Disposition: A | Discharge status: Home / Self Care | Payer: Medicare HMO | Source: Ambulatory Visit | Attending: Internal Medicine | Admitting: Internal Medicine

## 2023-03-17 ENCOUNTER — Ambulatory Visit: Payer: Medicare HMO | Admitting: Internal Medicine

## 2023-03-17 ENCOUNTER — Encounter: Payer: Self-pay | Admitting: Internal Medicine

## 2023-03-17 VITALS — Ht 66.0 in | Wt 95.0 lb

## 2023-03-17 DIAGNOSIS — J449 Chronic obstructive pulmonary disease, unspecified: Secondary | ICD-10-CM

## 2023-03-17 DIAGNOSIS — R059 Cough, unspecified: Secondary | ICD-10-CM | POA: Diagnosis not present

## 2023-03-17 MED ORDER — TRELEGY ELLIPTA 200-62.5-25 MCG/ACT IN AEPB: 1.0000 | INHALATION_SPRAY | Freq: Every day | RESPIRATORY_TRACT | 0 refills | Status: DC

## 2023-03-17 MED ORDER — TRELEGY ELLIPTA 100-62.5-25 MCG/ACT IN AEPB
INHALATION_SPRAY | RESPIRATORY_TRACT | 11 refills | Status: DC
Start: 2023-03-17 — End: 2023-11-25

## 2023-03-17 NOTE — Assessment & Plan Note (Signed)
Active smoker - Spirometry   02/02/2013: Show very severe obstruction with FEV1 0.77 (34%)   ratio of 0.43. -  PFT 03/29/13: Show significant improvement and she is on Gold stage II COPD . fev1 1.38L/58%,POSt BD is 1.36L/57%, Ratio 58, FVC 2.3L/76%, Ratio 58, DLCO 13/62% - 03/17/2023  After extensive coaching inhaler device,  effectiveness =    75% with DPI so resume trelegy 100 each am with neb prn q 4   She is very high risk for post op respiratory failure /TME related to underlying dementia and hearing deficits and unlikely to be cooperative post op as not cooperative pre op  (refused vital signs) so the only way I would proceed with elective surgery is:  No smoking x 2 weeks (she can't leave home to buy them so need fm help here) Resume trelegy 100 each am with prn saba neb esp just prior to leaving home for surgery and q 4h prn thereafter  Early mobilization  minimal sedation  PCCM f/u closely post op  EOL  discussion preop started today with husband who willing to acknowledge there is a limit to medical science in this setting, that we can still offer compassionate surgery and limited heroics post op in this setting ie no long term vent./ HD / pressors etc.         Each maintenance medication was reviewed in detail including emphasizing most importantly the difference between maintenance and prns and under what circumstances the prns are to be triggered using an action plan format where appropriate.  Total time for H and P, chart review, counseling, reviewing neb/DPI  device(s) and generating customized AVS unique to this office visit / same day charting = 45 min

## 2023-03-17 NOTE — Patient Instructions (Addendum)
Plan A = Automatic = Always=    Trelegy 100  1 click each am   Work on inhaler technique:  relax and gently blow all the way out  then take a strong tug and smooth full deep breath back in Hold breath in for at least  5 seconds if you can. Blow out trelegy  thru nose. Rinse and gargle with water when done.  If mouth or throat bother you at all,  try brushing teeth/gums/tongue with arm and hammer toothpaste/ make a slurry and gargle and spit out.    Plan B = Backup (to supplement plan A, not to replace it) Only use your albuterol nebulizer as a rescue medication to be used if you can't catch your breath by resting or doing a relaxed purse lip breathing pattern.  - The less you use it, the better it will work when you need it. - Ok to use the nebulizer up to   every 4 hours if you must   Use nebulizer treatment right before leaving home for surgery   NO smoking for 2 weeks prior to surgery   I will approve you for surgery but need you and your husband and son to understand the risk of post op respiratory failure and altered mental status are very high and we need a plan in place to deal with it if despite our best efforts it looks like you won't recover.   Please remember to go to the  x-ray department  @  [facility] for your tests - we will call you with the results when they are available

## 2023-04-07 ENCOUNTER — Other Ambulatory Visit: Payer: Self-pay | Admitting: Family

## 2023-04-07 DIAGNOSIS — I10 Essential (primary) hypertension: Secondary | ICD-10-CM

## 2023-06-10 ENCOUNTER — Ambulatory Visit (INDEPENDENT_AMBULATORY_CARE_PROVIDER_SITE_OTHER): Payer: Medicare HMO | Admitting: Family

## 2023-06-10 ENCOUNTER — Encounter: Payer: Self-pay | Admitting: Family

## 2023-06-10 VITALS — BP 121/72 | HR 71 | Temp 97.8°F | Ht 66.0 in | Wt 94.4 lb

## 2023-06-10 DIAGNOSIS — Z23 Encounter for immunization: Secondary | ICD-10-CM

## 2023-06-10 DIAGNOSIS — M5416 Radiculopathy, lumbar region: Secondary | ICD-10-CM

## 2023-06-10 DIAGNOSIS — I10 Essential (primary) hypertension: Secondary | ICD-10-CM | POA: Diagnosis not present

## 2023-06-10 DIAGNOSIS — Z0000 Encounter for general adult medical examination without abnormal findings: Secondary | ICD-10-CM

## 2023-06-10 DIAGNOSIS — Z122 Encounter for screening for malignant neoplasm of respiratory organs: Secondary | ICD-10-CM

## 2023-06-10 DIAGNOSIS — J449 Chronic obstructive pulmonary disease, unspecified: Secondary | ICD-10-CM | POA: Diagnosis not present

## 2023-06-10 DIAGNOSIS — Z0001 Encounter for general adult medical examination with abnormal findings: Secondary | ICD-10-CM | POA: Diagnosis not present

## 2023-06-10 DIAGNOSIS — G300 Alzheimer's disease with early onset: Secondary | ICD-10-CM

## 2023-06-10 DIAGNOSIS — F17200 Nicotine dependence, unspecified, uncomplicated: Secondary | ICD-10-CM

## 2023-06-10 DIAGNOSIS — E1159 Type 2 diabetes mellitus with other circulatory complications: Secondary | ICD-10-CM | POA: Diagnosis not present

## 2023-06-10 DIAGNOSIS — M5441 Lumbago with sciatica, right side: Secondary | ICD-10-CM | POA: Diagnosis not present

## 2023-06-10 DIAGNOSIS — K219 Gastro-esophageal reflux disease without esophagitis: Secondary | ICD-10-CM | POA: Diagnosis not present

## 2023-06-10 DIAGNOSIS — G8929 Other chronic pain: Secondary | ICD-10-CM | POA: Diagnosis not present

## 2023-06-10 DIAGNOSIS — E1169 Type 2 diabetes mellitus with other specified complication: Secondary | ICD-10-CM

## 2023-06-10 LAB — BAYER DCA HB A1C WAIVED: HB A1C (BAYER DCA - WAIVED): 6 % — ABNORMAL HIGH (ref 4.8–5.6)

## 2023-06-10 NOTE — Patient Instructions (Signed)
Health Maintenance After Age 71 After age 71, you are at a higher risk for certain long-term diseases and infections as well as injuries from falls. Falls are a major cause of broken bones and head injuries in people who are older than age 71. Getting regular preventive care can help to keep you healthy and well. Preventive care includes getting regular testing and making lifestyle changes as recommended by your health care provider. Talk with your health care provider about: Which screenings and tests you should have. A screening is a test that checks for a disease when you have no symptoms. A diet and exercise plan that is right for you. What should I know about screenings and tests to prevent falls? Screening and testing are the best ways to find a health problem early. Early diagnosis and treatment give you the best chance of managing medical conditions that are common after age 71. Certain conditions and lifestyle choices may make you more likely to have a fall. Your health care provider may recommend: Regular vision checks. Poor vision and conditions such as cataracts can make you more likely to have a fall. If you wear glasses, make sure to get your prescription updated if your vision changes. Medicine review. Work with your health care provider to regularly review all of the medicines you are taking, including over-the-counter medicines. Ask your health care provider about any side effects that may make you more likely to have a fall. Tell your health care provider if any medicines that you take make you feel dizzy or sleepy. Strength and balance checks. Your health care provider may recommend certain tests to check your strength and balance while standing, walking, or changing positions. Foot health exam. Foot pain and numbness, as well as not wearing proper footwear, can make you more likely to have a fall. Screenings, including: Osteoporosis screening. Osteoporosis is a condition that causes  the bones to get weaker and break more easily. Blood pressure screening. Blood pressure changes and medicines to control blood pressure can make you feel dizzy. Depression screening. You may be more likely to have a fall if you have a fear of falling, feel depressed, or feel unable to do activities that you used to do. Alcohol use screening. Using too much alcohol can affect your balance and may make you more likely to have a fall. Follow these instructions at home: Lifestyle Do not drink alcohol if: Your health care provider tells you not to drink. If you drink alcohol: Limit how much you have to: 0-1 drink a day for women. 0-2 drinks a day for men. Know how much alcohol is in your drink. In the [place] one drink equals one 12 oz bottle of beer (355 mL), one 5 oz glass of wine (148 mL), or one 1 oz glass of hard liquor (44 mL). Do not use any products that contain nicotine or tobacco. These products include cigarettes, chewing tobacco, and vaping devices, such as e-cigarettes. If you need help quitting, ask your health care provider. Activity  Follow a regular exercise program to stay fit. This will help you maintain your balance. Ask your health care provider what types of exercise are appropriate for you. If you need a cane or walker, use it as recommended by your health care provider. Wear supportive shoes that have nonskid soles. Safety  Remove any tripping hazards, such as rugs, cords, and clutter. Install safety equipment such as grab bars in bathrooms and safety rails on stairs. Keep rooms and walkways   well-lit. General instructions Talk with your health care provider about your risks for falling. Tell your health care provider if: You fall. Be sure to tell your health care provider about all falls, even ones that seem minor. You feel dizzy, tiredness (fatigue), or off-balance. Take over-the-counter and prescription medicines only as told by your health care provider. These include  supplements. Eat a healthy diet and maintain a healthy weight. A healthy diet includes low-fat dairy products, low-fat (lean) meats, and fiber from whole grains, beans, and lots of fruits and vegetables. Stay current with your vaccines. Schedule regular health, dental, and eye exams. Summary Having a healthy lifestyle and getting preventive care can help to protect your health and wellness after age 71. Screening and testing are the best way to find a health problem early and help you avoid having a fall. Early diagnosis and treatment give you the best chance for managing medical conditions that are more common for people who are older than age 71. Falls are a major cause of broken bones and head injuries in people who are older than age 71. Take precautions to prevent a fall at home. Work with your health care provider to learn what changes you can make to improve your health and wellness and to prevent falls. This information is not intended to replace advice given to you by your health care provider. Make sure you discuss any questions you have with your health care provider. Document Revised: 02/19/2021 Document Reviewed: 02/19/2021 Elsevier Patient Education  2024 [employer]  

## 2023-06-10 NOTE — Progress Notes (Signed)
Subjective:    Patient ID: Jamie [name], female    DOB: 03-22-1951, 72 y.o.   MRN: [id number]  Chief Complaint  Patient presents with   Medical Management of Chronic Issues   Back Pain    Today is a bad day    Sinus Problem    Been going on for 3 weeks not taking anything    PT presents to the office today for CPE and chronic follow up.  PT had surgical decompression and stabilization from L2-L4 on 02/05/17. Pt states her back continues to hurt and her pain is a 8 out 10 when she walks or standing.    She has CAD and takes statin daily.   She has COPD and has SOB and wheezing. She continues to smoke less than a pack a day. Uses Trelegy daily.    Husband reports memory changes. Taking Aricept 10 mg and Namenda 10 mg.  Back Pain This is a chronic problem. The current episode started more than 1 year ago. The problem occurs intermittently. The problem has been waxing and waning since onset. The pain is present in the lumbar spine. The quality of the pain is described as aching. The pain is at a severity of 8/10. The pain is moderate. Associated symptoms include leg pain.  Sinus Problem Associated symptoms include a hoarse voice and shortness of breath.  Hypertension This is a chronic problem. The current episode started more than 1 year ago. The problem has been resolved since onset. The problem is controlled. Associated symptoms include malaise/fatigue and shortness of breath. Pertinent negatives include no peripheral edema. Risk factors for coronary artery disease include dyslipidemia and sedentary lifestyle. The current treatment provides moderate improvement.  Diabetes She presents for her follow-up diabetic visit. She has type 2 diabetes mellitus. Associated symptoms include foot paresthesias. Diabetic complications include peripheral neuropathy. Risk factors for coronary artery disease include dyslipidemia, diabetes mellitus, hypertension, sedentary lifestyle and post-menopausal. She  is following a generally healthy diet. Her overall blood glucose range is 90-110 mg/dl. Eye exam is not current.  Gastroesophageal Reflux She complains of belching and a hoarse voice. This is a chronic problem. The current episode started more than 1 year ago. The problem occurs occasionally. She has tried a PPI for the symptoms. The treatment provided moderate relief.  Hyperlipidemia This is a chronic problem. The current episode started more than 1 year ago. The problem is controlled. Recent lipid tests were reviewed and are normal. Associated symptoms include leg pain and shortness of breath. Current antihyperlipidemic treatment includes statins. The current treatment provides moderate improvement of lipids. Risk factors for coronary artery disease include dyslipidemia, diabetes mellitus, hypertension, a sedentary lifestyle and post-menopausal.      Review of Systems  Constitutional:  Positive for malaise/fatigue.  HENT:  Positive for hoarse voice.   Respiratory:  Positive for shortness of breath.   Musculoskeletal:  Positive for back pain.  All other systems reviewed and are negative.  Family History  Problem Relation Age of Onset   Heart attack Mother    Stroke Mother    Stroke Sister    Diabetes Other        sister x3, and mother   Lung disease Father        worked in Forensic psychologist cancer Neg Hx    Colon polyps Neg Hx    Social History   Socioeconomic History   Marital status: Married    Spouse name: Dorene Sorrow  Number of children: 1   Years of education: 12   Highest education level: High school graduate  Occupational History   Occupation: disability  Tobacco Use   Smoking status: Every Day    Current packs/day: 1.00    Average packs/day: 1 pack/day for 55.8 years (55.8 ttl pk-yrs)    Types: Cigarettes    Start date: 08/11/1967   Smokeless tobacco: Never  Vaping Use   Vaping status: Never Used  Substance and Sexual Activity   Alcohol use: No   Drug use: No    Sexual activity: Not Currently    Birth control/protection: None  Other Topics Concern   Not on file  Social History Narrative   Retired/disabled. Lives at home with her husband. He still works part-time. Currently following social distancing guidelines.    Social Determinants of Health   Financial Resource Strain: Low Risk  (12/30/2022)   Overall Financial Resource Strain (CARDIA)    Difficulty of Paying Living Expenses: Not hard at all  Food Insecurity: No Food Insecurity (12/30/2022)   Hunger Vital Sign    Worried About Running Out of Food in the Last Year: Never true    Ran Out of Food in the Last Year: Never true  Transportation Needs: No Transportation Needs (12/30/2022)   PRAPARE - Administrator, Civil Service (Medical): No    Lack of Transportation (Non-Medical): No  Physical Activity: Insufficiently Active (12/30/2022)   Exercise Vital Sign    Days of Exercise per Week: 3 days    Minutes of Exercise per Session: 30 min  Stress: No Stress Concern Present (12/30/2022)   [employer] of Occupational Health - Occupational Stress Questionnaire    Feeling of Stress : Only a little  Social Connections: Moderately Integrated (12/30/2022)   Social Connection and Isolation Panel [NHANES]    Frequency of Communication with Friends and Family: More than three times a week    Frequency of Social Gatherings with Friends and Family: More than three times a week    Attends Religious Services: More than 4 times per year    Active Member of [employer] or Organizations: No    Attends Banker Meetings: Never    Marital Status: Married        Objective:   Physical Exam Vitals reviewed.  Constitutional:      General: She is not in acute distress.    Appearance: She is well-developed.     Comments: underweight  HENT:     Head: Normocephalic and atraumatic.     Ears:     Comments: HOH Eyes:     Pupils: Pupils are equal, round, and reactive to light.  Neck:      Thyroid: No thyromegaly.  Cardiovascular:     Rate and Rhythm: Normal rate and regular rhythm.     Heart sounds: Normal heart sounds. No murmur heard. Pulmonary:     Effort: Pulmonary effort is normal. No respiratory distress.     Breath sounds: Normal breath sounds. No wheezing.  Abdominal:     General: Bowel sounds are normal. There is no distension.     Palpations: Abdomen is soft.     Tenderness: There is no abdominal tenderness.  Musculoskeletal:        General: Tenderness present.     Cervical back: Normal range of motion and neck supple.     Comments: Pain in lumbar with flexion and extension, walking at 80 degrees  Skin:    General: Skin  is warm and dry.  Neurological:     Mental Status: She is alert and oriented to person, place, and time.     Cranial Nerves: No cranial nerve deficit.     Deep Tendon Reflexes: Reflexes are normal and symmetric.  Psychiatric:        Behavior: Behavior normal.        Thought Content: Thought content normal.        Judgment: Judgment normal.     BP (!) 158/74   Pulse 71   Temp 97.8 F (36.6 C) (Temporal)   Ht 5\' 6"  (1.676 m)   Wt 94 lb 6.4 oz (42.8 kg)   SpO2 93%   BMI 15.24 kg/m      Assessment & Plan:  Jamie [name] comes in today with chief complaint of Medical Management of Chronic Issues, Back Pain (Today is a bad day ), and Sinus Problem (Been going on for 3 weeks not taking anything )   Diagnosis and orders addressed:  1. Need for shingles vaccine - CBC with Differential/Platelet - CMP14+EGFR  2. Chronic bilateral low back pain with right-sided sciatica - CBC with Differential/Platelet - CMP14+EGFR - Ambulatory referral to Neurosurgery  3. COPD GOLD 2/ AB - CBC with Differential/Platelet - CMP14+EGFR  4. Current smoker - CBC with Differential/Platelet - CMP14+EGFR - Ambulatory Referral Lung Cancer Screening [facility]  5. DM type 2 causing vascular disease (HCC) - Bayer DCA Hb A1c Waived - CBC  with Differential/Platelet - CMP14+EGFR  6. Essential hypertension, benign - CBC with Differential/Platelet - CMP14+EGFR  7. Gastroesophageal reflux disease without esophagitis - CBC with Differential/Platelet - CMP14+EGFR  8. Hyperlipidemia associated with type 2 diabetes mellitus (HCC) - CBC with Differential/Platelet - CMP14+EGFR - Lipid panel  9. Lumbar radiculopathy, chronic - CBC with Differential/Platelet - CMP14+EGFR - Ambulatory referral to Neurosurgery  10. Annual physical exam - Bayer DCA Hb A1c Waived - CBC with Differential/Platelet - CMP14+EGFR - Lipid panel - TSH  11. Screening for lung cancer - Ambulatory Referral Lung Cancer Screening [facility]  12. Moderate early onset Alzheimer's dementia with agitation [facility])  Referral to CT scan  Referral to neurosurgeon  Labs pending Health Maintenance reviewed Diet and exercise encouraged  Follow up plan: 3 months    Jannifer Rodney, FNP

## 2023-06-11 LAB — CMP14+EGFR
ALT: 11 IU/L (ref 0–32)
AST: 16 IU/L (ref 0–40)
Albumin: 3.9 g/dL (ref 3.9–4.9)
Alkaline Phosphatase: 101 IU/L (ref 44–121)
BUN/Creatinine Ratio: 24 (ref 12–28)
BUN: 26 mg/dL (ref 8–27)
Bilirubin Total: <0.2 mg/dL (ref 0.0–1.2)
CO2: 24 mmol/L (ref 20–29)
Calcium: 9 mg/dL (ref 8.7–10.3)
Chloride: 103 mmol/L (ref 96–106)
Creatinine, Ser: 1.1 mg/dL — ABNORMAL HIGH (ref 0.57–1.00)
Globulin, Total: 2.2 g/dL (ref 1.5–4.5)
Glucose: 216 mg/dL — ABNORMAL HIGH (ref 70–99)
Potassium: 4.7 mmol/L (ref 3.5–5.2)
Sodium: 136 mmol/L (ref 134–144)
Total Protein: 6.1 g/dL (ref 6.0–8.5)
eGFR: 54 mL/min/{1.73_m2} — ABNORMAL LOW (ref >59)

## 2023-06-11 LAB — CBC WITH DIFFERENTIAL/PLATELET
Basophils Absolute: 0.1 10*3/uL (ref 0.0–0.2)
Basos: 1 %
EOS (ABSOLUTE): 0.2 10*3/uL (ref 0.0–0.4)
Eos: 2 %
Hematocrit: 37.4 % (ref 34.0–46.6)
Hemoglobin: 12 g/dL (ref 11.1–15.9)
Immature Grans (Abs): 0 10*3/uL (ref 0.0–0.1)
Immature Granulocytes: 0 %
Lymphocytes Absolute: 1.2 10*3/uL (ref 0.7–3.1)
Lymphs: 19 %
MCH: 26.5 pg — ABNORMAL LOW (ref 26.6–33.0)
MCHC: 32.1 g/dL (ref 31.5–35.7)
MCV: 83 fL (ref 79–97)
Monocytes Absolute: 0.5 10*3/uL (ref 0.1–0.9)
Monocytes: 8 %
Neutrophils Absolute: 4.4 10*3/uL (ref 1.4–7.0)
Neutrophils: 70 %
Platelets: 228 10*3/uL (ref 150–450)
RBC: 4.52 x10E6/uL (ref 3.77–5.28)
RDW: 13.1 % (ref 11.7–15.4)
WBC: 6.3 10*3/uL (ref 3.4–10.8)

## 2023-06-11 LAB — LIPID PANEL
Chol/HDL Ratio: 2.4 ratio (ref 0.0–4.4)
Cholesterol, Total: 145 mg/dL (ref 100–199)
HDL: 60 mg/dL
LDL Chol Calc (NIH): 72 mg/dL (ref 0–99)
Triglycerides: 65 mg/dL (ref 0–149)
VLDL Cholesterol Cal: 13 mg/dL (ref 5–40)

## 2023-06-11 LAB — TSH: TSH: 0.791 u[IU]/mL (ref 0.450–4.500)

## 2023-08-14 ENCOUNTER — Encounter: Payer: Self-pay | Admitting: Family

## 2023-08-19 ENCOUNTER — Ambulatory Visit (INDEPENDENT_AMBULATORY_CARE_PROVIDER_SITE_OTHER): Payer: Medicare HMO | Admitting: Family

## 2023-08-19 ENCOUNTER — Encounter: Payer: Self-pay | Admitting: Family

## 2023-08-19 VITALS — BP 127/70 | HR 73 | Temp 98.3°F | Wt 94.6 lb

## 2023-08-19 DIAGNOSIS — E1159 Type 2 diabetes mellitus with other circulatory complications: Secondary | ICD-10-CM

## 2023-08-19 DIAGNOSIS — J449 Chronic obstructive pulmonary disease, unspecified: Secondary | ICD-10-CM | POA: Diagnosis not present

## 2023-08-19 DIAGNOSIS — H9190 Unspecified hearing loss, unspecified ear: Secondary | ICD-10-CM | POA: Diagnosis not present

## 2023-08-19 DIAGNOSIS — R350 Frequency of micturition: Secondary | ICD-10-CM | POA: Diagnosis not present

## 2023-08-19 DIAGNOSIS — F02B11 Dementia in other diseases classified elsewhere, moderate, with agitation: Secondary | ICD-10-CM

## 2023-08-19 DIAGNOSIS — M5416 Radiculopathy, lumbar region: Secondary | ICD-10-CM | POA: Diagnosis not present

## 2023-08-19 DIAGNOSIS — G8929 Other chronic pain: Secondary | ICD-10-CM

## 2023-08-19 DIAGNOSIS — F17200 Nicotine dependence, unspecified, uncomplicated: Secondary | ICD-10-CM | POA: Diagnosis not present

## 2023-08-19 DIAGNOSIS — G300 Alzheimer's disease with early onset: Secondary | ICD-10-CM

## 2023-08-19 DIAGNOSIS — K219 Gastro-esophageal reflux disease without esophagitis: Secondary | ICD-10-CM | POA: Diagnosis not present

## 2023-08-19 DIAGNOSIS — M5441 Lumbago with sciatica, right side: Secondary | ICD-10-CM

## 2023-08-19 NOTE — Patient Instructions (Signed)

## 2023-08-19 NOTE — Progress Notes (Signed)
Subjective:    Patient ID: Jamie [name], female    DOB: [birth year], 72 y.o.   MRN: [id number]  Chief Complaint  Patient presents with   Medical Management of Chronic Issues   Urinary Tract Infection   PT presents to the office today for chronic follow up.  PT had surgical decompression and stabilization from L2-L4 on 02/05/17. Pt states her back continues to hurt and her pain is a 8 out 10 when she walks or standing.    She has CAD and takes statin daily.   She has COPD and has SOB and wheezing. She continues to smoke less than a pack a day. Uses Trelegy daily.    Husband reports memory changes. Taking Aricept 10 mg and Namenda 10 mg.  Urinary Frequency  This is a new problem. The current episode started 1 to 4 weeks ago. The pain is mild. Associated symptoms include frequency, hesitancy and urgency. Pertinent negatives include no hematuria, nausea or vomiting. She has tried increased fluids for the symptoms. The treatment provided mild relief.  Back Pain This is a chronic problem. The current episode started more than 1 year ago. The problem occurs intermittently. The pain is present in the lumbar spine. The pain is at a severity of 7/10. The pain is moderate. Associated symptoms include leg pain. She has tried analgesics for the symptoms.  Diabetes She presents for her follow-up diabetic visit. She has type 2 diabetes mellitus. Pertinent negatives for diabetes include no blurred vision. Risk factors for coronary artery disease include dyslipidemia, diabetes mellitus, hypertension and sedentary lifestyle. She is following a generally healthy diet. Her overall blood glucose range is 140-180 mg/dl.  Hypertension This is a chronic problem. The current episode started more than 1 year ago. The problem has been resolved since onset. The problem is controlled. Associated symptoms include malaise/fatigue and shortness of breath. Pertinent negatives include no blurred vision or peripheral edema.  Risk factors for coronary artery disease include dyslipidemia, diabetes mellitus, obesity and sedentary lifestyle. The current treatment provides moderate improvement.      Review of Systems  Constitutional:  Positive for malaise/fatigue.  Eyes:  Negative for blurred vision.  Respiratory:  Positive for shortness of breath.   Gastrointestinal:  Negative for nausea and vomiting.  Genitourinary:  Positive for frequency, hesitancy and urgency. Negative for hematuria.  Musculoskeletal:  Positive for back pain.  All other systems reviewed and are negative.      Objective:   Physical Exam Vitals reviewed.  Constitutional:      General: She is not in acute distress.    Appearance: She is well-developed.  HENT:     Head: Normocephalic and atraumatic.     Right Ear: Tympanic membrane normal.     Left Ear: Tympanic membrane normal.     Ears:     Comments: HOH  Eyes:     Pupils: Pupils are equal, round, and reactive to light.  Neck:     Thyroid: No thyromegaly.  Cardiovascular:     Rate and Rhythm: Normal rate and regular rhythm.     Heart sounds: Normal heart sounds. No murmur heard. Pulmonary:     Effort: Pulmonary effort is normal. No respiratory distress.     Breath sounds: Normal breath sounds. No wheezing.  Abdominal:     General: Bowel sounds are normal. There is no distension.     Palpations: Abdomen is soft.     Tenderness: There is no abdominal tenderness.  Musculoskeletal:  General: Tenderness present.     Cervical back: Normal range of motion and neck supple.     Comments: Pain in lumbar, walking at 80 degrees   Skin:    General: Skin is warm and dry.  Neurological:     Mental Status: She is alert and oriented to person, place, and time.     Cranial Nerves: No cranial nerve deficit.     Deep Tendon Reflexes: Reflexes are normal and symmetric.  Psychiatric:        Behavior: Behavior normal.        Thought Content: Thought content normal.        Judgment:  Judgment normal.      BP 127/70   Pulse 73   Temp 98.3 F (36.8 C) (Temporal)   Wt 94 lb 9.6 oz (42.9 kg)   SpO2 98%   BMI 15.27 kg/m      Assessment & Plan:   Jamie [name] comes in today with chief complaint of Medical Management of Chronic Issues and Urinary Tract Infection   Diagnosis and orders addressed:  1. DM type 2 causing vascular disease (HCC) - Microalbumin / creatinine urine ratio - CMP14+EGFR  2. Urine frequency - Urine Culture - Urinalysis, Complete - CMP14+EGFR  3. COPD GOLD 2/ AB - CMP14+EGFR  4. Current smoker - CMP14+EGFR  5. Hard of hearing - CMP14+EGFR  6. Gastroesophageal reflux disease without esophagitis - CMP14+EGFR  7. Chronic bilateral low back pain with right-sided sciatica - CMP14+EGFR - Ambulatory referral to Neurosurgery  8. Lumbar radiculopathy, chronic - CMP14+EGFR - Ambulatory referral to Neurosurgery  9. Moderate early onset Alzheimer's dementia with agitation (HCC) - CMP14+EGFR   Labs pending Continue current medications  Health Maintenance reviewed Diet and exercise encouraged  Follow up plan: 3 months    Jannifer Rodney, FNP

## 2023-08-20 ENCOUNTER — Other Ambulatory Visit: Payer: Medicare HMO

## 2023-08-20 DIAGNOSIS — E1159 Type 2 diabetes mellitus with other circulatory complications: Secondary | ICD-10-CM | POA: Diagnosis not present

## 2023-08-20 DIAGNOSIS — R350 Frequency of micturition: Secondary | ICD-10-CM | POA: Diagnosis not present

## 2023-08-20 LAB — URINALYSIS, COMPLETE
Bilirubin, UA: NEGATIVE
Glucose, UA: NEGATIVE
Ketones, UA: NEGATIVE
Nitrite, UA: POSITIVE — AB
Specific Gravity, UA: 1.015 (ref 1.005–1.030)
Urobilinogen, Ur: 0.2 mg/dL (ref 0.2–1.0)
pH, UA: 5.5 (ref 5.0–7.5)

## 2023-08-20 LAB — CMP14+EGFR
ALT: 13 [IU]/L (ref 0–32)
AST: 15 [IU]/L (ref 0–40)
Albumin: 3.8 g/dL (ref 3.8–4.8)
Alkaline Phosphatase: 101 [IU]/L (ref 44–121)
BUN/Creatinine Ratio: 22 (ref 12–28)
BUN: 25 mg/dL (ref 8–27)
Bilirubin Total: <0.2 mg/dL (ref 0.0–1.2)
CO2: 22 mmol/L (ref 20–29)
Calcium: 9.1 mg/dL (ref 8.7–10.3)
Chloride: 106 mmol/L (ref 96–106)
Creatinine, Ser: 1.12 mg/dL — ABNORMAL HIGH (ref 0.57–1.00)
Globulin, Total: 2.4 g/dL (ref 1.5–4.5)
Glucose: 114 mg/dL — ABNORMAL HIGH (ref 70–99)
Potassium: 4.3 mmol/L (ref 3.5–5.2)
Sodium: 140 mmol/L (ref 134–144)
Total Protein: 6.2 g/dL (ref 6.0–8.5)
eGFR: 53 mL/min/{1.73_m2} — ABNORMAL LOW (ref >59)

## 2023-08-20 LAB — MICROSCOPIC EXAMINATION
Epithelial Cells (non renal): NONE SEEN /[HPF] (ref 0–10)
Renal Epithel, UA: NONE SEEN /[HPF]
WBC, UA: >30 /[HPF] — AB (ref 0–5)
Yeast, UA: NONE SEEN

## 2023-08-21 ENCOUNTER — Other Ambulatory Visit: Payer: Self-pay | Admitting: Family

## 2023-08-21 LAB — MICROALBUMIN / CREATININE URINE RATIO
Creatinine, Urine: 44 mg/dL
Microalb/Creat Ratio: 288 mg/g{creat} — ABNORMAL HIGH (ref 0–29)
Microalbumin, Urine: 126.6 ug/mL

## 2023-08-21 MED ORDER — CEPHALEXIN 500 MG PO CAPS: 500.0000 mg | ORAL_CAPSULE | Freq: Two times a day (BID) | ORAL | 0 refills | Status: DC

## 2023-08-25 LAB — URINE CULTURE

## 2023-10-01 ENCOUNTER — Other Ambulatory Visit: Payer: Self-pay | Admitting: Family

## 2023-10-01 DIAGNOSIS — I10 Essential (primary) hypertension: Secondary | ICD-10-CM

## 2023-10-17 ENCOUNTER — Other Ambulatory Visit: Payer: Self-pay | Admitting: Family

## 2023-10-17 DIAGNOSIS — E1159 Type 2 diabetes mellitus with other circulatory complications: Secondary | ICD-10-CM

## 2023-11-23 ENCOUNTER — Emergency Department (HOSPITAL_COMMUNITY): Payer: Medicare HMO

## 2023-11-23 ENCOUNTER — Other Ambulatory Visit: Payer: Self-pay

## 2023-11-23 ENCOUNTER — Inpatient Hospital Stay (HOSPITAL_COMMUNITY)
Admission: EM | Admit: 2023-11-23 | Discharge: 2023-11-25 | DRG: 638 | Disposition: A | Discharge status: Home Health | Payer: Medicare HMO | Attending: Family Medicine | Admitting: Family Medicine

## 2023-11-23 ENCOUNTER — Encounter (HOSPITAL_COMMUNITY): Payer: Self-pay | Admitting: Emergency Medicine

## 2023-11-23 DIAGNOSIS — S199XXA Unspecified injury of neck, initial encounter: Secondary | ICD-10-CM | POA: Diagnosis not present

## 2023-11-23 DIAGNOSIS — K449 Diaphragmatic hernia without obstruction or gangrene: Secondary | ICD-10-CM | POA: Diagnosis not present

## 2023-11-23 DIAGNOSIS — G309 Alzheimer's disease, unspecified: Secondary | ICD-10-CM | POA: Diagnosis not present

## 2023-11-23 DIAGNOSIS — Z853 Personal history of malignant neoplasm of breast: Secondary | ICD-10-CM | POA: Diagnosis not present

## 2023-11-23 DIAGNOSIS — G300 Alzheimer's disease with early onset: Secondary | ICD-10-CM | POA: Diagnosis not present

## 2023-11-23 DIAGNOSIS — E08649 Diabetes mellitus due to underlying condition with hypoglycemia without coma: Secondary | ICD-10-CM | POA: Diagnosis present

## 2023-11-23 DIAGNOSIS — Z7951 Long term (current) use of inhaled steroids: Secondary | ICD-10-CM

## 2023-11-23 DIAGNOSIS — I10 Essential (primary) hypertension: Secondary | ICD-10-CM | POA: Diagnosis present

## 2023-11-23 DIAGNOSIS — J438 Other emphysema: Secondary | ICD-10-CM | POA: Diagnosis not present

## 2023-11-23 DIAGNOSIS — E1159 Type 2 diabetes mellitus with other circulatory complications: Secondary | ICD-10-CM

## 2023-11-23 DIAGNOSIS — Z8249 Family history of ischemic heart disease and other diseases of the circulatory system: Secondary | ICD-10-CM | POA: Diagnosis not present

## 2023-11-23 DIAGNOSIS — R350 Frequency of micturition: Secondary | ICD-10-CM | POA: Diagnosis present

## 2023-11-23 DIAGNOSIS — Z981 Arthrodesis status: Secondary | ICD-10-CM

## 2023-11-23 DIAGNOSIS — E782 Mixed hyperlipidemia: Secondary | ICD-10-CM | POA: Diagnosis present

## 2023-11-23 DIAGNOSIS — Z1152 Encounter for screening for COVID-19: Secondary | ICD-10-CM | POA: Diagnosis not present

## 2023-11-23 DIAGNOSIS — G308 Other Alzheimer's disease: Secondary | ICD-10-CM

## 2023-11-23 DIAGNOSIS — Z833 Family history of diabetes mellitus: Secondary | ICD-10-CM

## 2023-11-23 DIAGNOSIS — T796XXA Traumatic ischemia of muscle, initial encounter: Secondary | ICD-10-CM | POA: Diagnosis not present

## 2023-11-23 DIAGNOSIS — Z888 Allergy status to other drugs, medicaments and biological substances status: Secondary | ICD-10-CM | POA: Diagnosis not present

## 2023-11-23 DIAGNOSIS — R739 Hyperglycemia, unspecified: Secondary | ICD-10-CM | POA: Diagnosis not present

## 2023-11-23 DIAGNOSIS — J449 Chronic obstructive pulmonary disease, unspecified: Secondary | ICD-10-CM | POA: Diagnosis present

## 2023-11-23 DIAGNOSIS — Z043 Encounter for examination and observation following other accident: Secondary | ICD-10-CM | POA: Diagnosis not present

## 2023-11-23 DIAGNOSIS — I6523 Occlusion and stenosis of bilateral carotid arteries: Secondary | ICD-10-CM | POA: Diagnosis not present

## 2023-11-23 DIAGNOSIS — Z794 Long term (current) use of insulin: Secondary | ICD-10-CM

## 2023-11-23 DIAGNOSIS — Z923 Personal history of irradiation: Secondary | ICD-10-CM

## 2023-11-23 DIAGNOSIS — R55 Syncope and collapse: Secondary | ICD-10-CM | POA: Diagnosis not present

## 2023-11-23 DIAGNOSIS — R404 Transient alteration of awareness: Secondary | ICD-10-CM | POA: Diagnosis not present

## 2023-11-23 DIAGNOSIS — R7989 Other specified abnormal findings of blood chemistry: Secondary | ICD-10-CM | POA: Diagnosis present

## 2023-11-23 DIAGNOSIS — R0902 Hypoxemia: Secondary | ICD-10-CM | POA: Diagnosis not present

## 2023-11-23 DIAGNOSIS — F03918 Unspecified dementia, unspecified severity, with other behavioral disturbance: Secondary | ICD-10-CM

## 2023-11-23 DIAGNOSIS — E876 Hypokalemia: Secondary | ICD-10-CM | POA: Diagnosis present

## 2023-11-23 DIAGNOSIS — F02818 Dementia in other diseases classified elsewhere, unspecified severity, with other behavioral disturbance: Secondary | ICD-10-CM | POA: Diagnosis present

## 2023-11-23 DIAGNOSIS — E11649 Type 2 diabetes mellitus with hypoglycemia without coma: Secondary | ICD-10-CM | POA: Diagnosis not present

## 2023-11-23 DIAGNOSIS — F02C0 Dementia in other diseases classified elsewhere, severe, without behavioral disturbance, psychotic disturbance, mood disturbance, and anxiety: Secondary | ICD-10-CM | POA: Diagnosis not present

## 2023-11-23 DIAGNOSIS — M6282 Rhabdomyolysis: Secondary | ICD-10-CM | POA: Diagnosis not present

## 2023-11-23 DIAGNOSIS — T68XXXD Hypothermia, subsequent encounter: Secondary | ICD-10-CM | POA: Diagnosis not present

## 2023-11-23 DIAGNOSIS — R296 Repeated falls: Secondary | ICD-10-CM | POA: Diagnosis present

## 2023-11-23 DIAGNOSIS — Z7982 Long term (current) use of aspirin: Secondary | ICD-10-CM

## 2023-11-23 DIAGNOSIS — F0280 Dementia in other diseases classified elsewhere without behavioral disturbance: Secondary | ICD-10-CM | POA: Diagnosis present

## 2023-11-23 DIAGNOSIS — H9190 Unspecified hearing loss, unspecified ear: Secondary | ICD-10-CM | POA: Diagnosis present

## 2023-11-23 DIAGNOSIS — Y92009 Unspecified place in unspecified non-institutional (private) residence as the place of occurrence of the external cause: Secondary | ICD-10-CM | POA: Diagnosis not present

## 2023-11-23 DIAGNOSIS — Z823 Family history of stroke: Secondary | ICD-10-CM

## 2023-11-23 DIAGNOSIS — M546 Pain in thoracic spine: Secondary | ICD-10-CM | POA: Diagnosis not present

## 2023-11-23 DIAGNOSIS — R231 Pallor: Secondary | ICD-10-CM | POA: Diagnosis not present

## 2023-11-23 DIAGNOSIS — T68XXXA Hypothermia, initial encounter: Secondary | ICD-10-CM | POA: Diagnosis present

## 2023-11-23 DIAGNOSIS — S0990XA Unspecified injury of head, initial encounter: Secondary | ICD-10-CM | POA: Diagnosis not present

## 2023-11-23 DIAGNOSIS — W19XXXA Unspecified fall, initial encounter: Secondary | ICD-10-CM | POA: Diagnosis present

## 2023-11-23 DIAGNOSIS — E162 Hypoglycemia, unspecified: Secondary | ICD-10-CM | POA: Diagnosis present

## 2023-11-23 DIAGNOSIS — R680 Hypothermia, not associated with low environmental temperature: Secondary | ICD-10-CM | POA: Diagnosis not present

## 2023-11-23 DIAGNOSIS — M545 Low back pain, unspecified: Secondary | ICD-10-CM | POA: Diagnosis not present

## 2023-11-23 DIAGNOSIS — R3915 Urgency of urination: Secondary | ICD-10-CM | POA: Diagnosis present

## 2023-11-23 DIAGNOSIS — F17210 Nicotine dependence, cigarettes, uncomplicated: Secondary | ICD-10-CM | POA: Diagnosis present

## 2023-11-23 DIAGNOSIS — J439 Emphysema, unspecified: Secondary | ICD-10-CM | POA: Diagnosis not present

## 2023-11-23 DIAGNOSIS — Z825 Family history of asthma and other chronic lower respiratory diseases: Secondary | ICD-10-CM

## 2023-11-23 DIAGNOSIS — M47816 Spondylosis without myelopathy or radiculopathy, lumbar region: Secondary | ICD-10-CM | POA: Diagnosis not present

## 2023-11-23 DIAGNOSIS — Z79899 Other long term (current) drug therapy: Secondary | ICD-10-CM

## 2023-11-23 DIAGNOSIS — I700 Atherosclerosis of aorta: Secondary | ICD-10-CM | POA: Diagnosis not present

## 2023-11-23 HISTORY — DX: Delirium due to known physiological condition: F05

## 2023-11-23 HISTORY — DX: Unspecified dementia, unspecified severity, without behavioral disturbance, psychotic disturbance, mood disturbance, and anxiety: F03.90

## 2023-11-23 LAB — URINALYSIS, W/ REFLEX TO CULTURE (INFECTION SUSPECTED)
Bacteria, UA: NONE SEEN
Bilirubin Urine: NEGATIVE
Glucose, UA: >=500 mg/dL — AB
Ketones, ur: NEGATIVE mg/dL
Leukocytes,Ua: NEGATIVE
Nitrite: NEGATIVE
Protein, ur: NEGATIVE mg/dL
Specific Gravity, Urine: 1.005 (ref 1.005–1.030)
pH: 6 (ref 5.0–8.0)

## 2023-11-23 LAB — BASIC METABOLIC PANEL
Anion gap: 10 (ref 5–15)
BUN: 16 mg/dL (ref 8–23)
CO2: 23 mmol/L (ref 22–32)
Calcium: 9 mg/dL (ref 8.9–10.3)
Chloride: 104 mmol/L (ref 98–111)
Creatinine, Ser: 0.73 mg/dL (ref 0.44–1.00)
GFR, Estimated: >60 mL/min (ref >60)
Glucose, Bld: 136 mg/dL — ABNORMAL HIGH (ref 70–99)
Potassium: 3.3 mmol/L — ABNORMAL LOW (ref 3.5–5.1)
Sodium: 137 mmol/L (ref 135–145)

## 2023-11-23 LAB — CBC WITH DIFFERENTIAL/PLATELET
Abs Immature Granulocytes: 0.06 10*3/uL (ref 0.00–0.07)
Basophils Absolute: 0 10*3/uL (ref 0.0–0.1)
Basophils Relative: 0 %
Eosinophils Absolute: 0 10*3/uL (ref 0.0–0.5)
Eosinophils Relative: 0 %
HCT: 38.6 % (ref 36.0–46.0)
Hemoglobin: 12.3 g/dL (ref 12.0–15.0)
Immature Granulocytes: 1 %
Lymphocytes Relative: 5 %
Lymphs Abs: 0.5 10*3/uL — ABNORMAL LOW (ref 0.7–4.0)
MCH: 27 pg (ref 26.0–34.0)
MCHC: 31.9 g/dL (ref 30.0–36.0)
MCV: 84.8 fL (ref 80.0–100.0)
Monocytes Absolute: 0.6 10*3/uL (ref 0.1–1.0)
Monocytes Relative: 5 %
Neutro Abs: 9.6 10*3/uL — ABNORMAL HIGH (ref 1.7–7.7)
Neutrophils Relative %: 89 %
Platelets: 175 10*3/uL (ref 150–400)
RBC: 4.55 MIL/uL (ref 3.87–5.11)
RDW: 14.6 % (ref 11.5–15.5)
WBC: 10.8 10*3/uL — ABNORMAL HIGH (ref 4.0–10.5)
nRBC: 0 % (ref 0.0–0.2)

## 2023-11-23 LAB — CBG MONITORING, ED
Glucose-Capillary: 122 mg/dL — ABNORMAL HIGH (ref 70–99)
Glucose-Capillary: 133 mg/dL — ABNORMAL HIGH (ref 70–99)
Glucose-Capillary: 16 mg/dL — CL (ref 70–99)
Glucose-Capillary: 167 mg/dL — ABNORMAL HIGH (ref 70–99)
Glucose-Capillary: 94 mg/dL (ref 70–99)
Glucose-Capillary: 96 mg/dL (ref 70–99)

## 2023-11-23 LAB — RESP PANEL BY RT-PCR (RSV, FLU A&B, COVID)  RVPGX2
Influenza A by PCR: NEGATIVE
Influenza B by PCR: NEGATIVE
Resp Syncytial Virus by PCR: NEGATIVE
SARS Coronavirus 2 by RT PCR: NEGATIVE

## 2023-11-23 LAB — TROPONIN I (HIGH SENSITIVITY)
Troponin I (High Sensitivity): 41 ng/L — ABNORMAL HIGH (ref <18)
Troponin I (High Sensitivity): 69 ng/L — ABNORMAL HIGH (ref <18)

## 2023-11-23 LAB — CK: Total CK: 1063 U/L — ABNORMAL HIGH (ref 38–234)

## 2023-11-23 MED ORDER — DEXTROSE 50 % IV SOLN
INTRAVENOUS | Status: AC
  Filled 2023-11-23: qty 50

## 2023-11-23 MED ORDER — DEXTROSE 50 % IV SOLN
INTRAVENOUS | Status: AC
  Administered 2023-11-23: 50 mL via INTRAVENOUS
  Filled 2023-11-23: qty 50

## 2023-11-23 MED ORDER — DEXTROSE 50 % IV SOLN: 1.0000 | Freq: Once | INTRAVENOUS | Status: DC

## 2023-11-23 MED ORDER — DEXTROSE 10 % IV SOLN
Freq: Once | INTRAVENOUS | Status: AC
Start: 2023-11-23 — End: 2023-11-24

## 2023-11-23 MED ORDER — ONDANSETRON HCL 4 MG/2ML IJ SOLN
4.0000 mg | Freq: Once | INTRAMUSCULAR | Status: AC
  Administered 2023-11-23: 4 mg via INTRAVENOUS
  Filled 2023-11-23: qty 2

## 2023-11-23 MED ORDER — MORPHINE SULFATE (PF) 4 MG/ML IV SOLN
4.0000 mg | Freq: Once | INTRAVENOUS | Status: AC
Start: 2023-11-23 — End: 2023-11-23
  Administered 2023-11-23: 4 mg via INTRAVENOUS
  Filled 2023-11-23: qty 1

## 2023-11-23 MED ORDER — POTASSIUM CHLORIDE CRYS ER 20 MEQ PO TBCR: 40.0000 meq | EXTENDED_RELEASE_TABLET | Freq: Once | ORAL | Status: DC

## 2023-11-23 MED ORDER — OLANZAPINE 5 MG PO TBDP
5.0000 mg | ORAL_TABLET | Freq: Once | ORAL | Status: AC
  Administered 2023-11-23: 5 mg via ORAL
  Filled 2023-11-23: qty 1

## 2023-11-23 MED ORDER — SODIUM CHLORIDE 0.9 % IV BOLUS
1000.0000 mL | Freq: Once | INTRAVENOUS | Status: AC
  Administered 2023-11-23: 1000 mL via INTRAVENOUS

## 2023-11-23 NOTE — ED Provider Notes (Signed)
 [place] EMERGENCY DEPARTMENT AT [facility] Provider Note  CSN: [id number] Arrival date & time: 11/23/23 1928  Chief Complaint(s) Hypoglycemia  HPI Jamie [name] is a 73 y.o. female with past medical history as below, significant for COPD, IDDM, Alzheimer's dementia with agitation, very hard of hearing who presents to the ED with complaint of fall, hypoglycemia, agitation.   Patient here with her family close reports that he went to work, detox around 3 PM and then he got home tonight around 8 PM Jamie [name] was on the floor, reduced responsiveness.  Upon EMS arrival blood sugar was 40, Jamie [name] was somewhat cool to the touch.  Mental status improved with D50.  Husband reports past 3-4 evenings that Jamie [name] has been increasingly agitated at nighttime but acting normal the daytime.  Increased urinary frequency and urgency.  Has not been eating much over the past week, still taking her regular insulin .  No fevers, chills, nausea or vomiting, chest pain or abdominal pain.  He reports that Jamie [name] has been taking her regular medications.   Past Medical History Past Medical History:  Diagnosis Date   Anxiety    Breast cancer (HCC)    COPD (chronic obstructive pulmonary disease) (HCC)    Deaf    Depression    Diabetes (HCC)    HOH (hard of hearing)    HTN (hypertension)    Hyperlipidemia    Personal history of radiation therapy    Vitamin D  deficiency    Patient Active Problem List   Diagnosis Date Noted   Hypoglycemia 11/23/2023   Moderate early onset Alzheimer's dementia with agitation (HCC) 11/07/2022   Left wrist pain 06/29/2021   Essential hypertension, benign 08/10/2019   Postural kyphosis of lumbar region 02/23/2019   Tobacco abuse counseling 09/09/2018   Lumbar radiculopathy, chronic 02/03/2017   DM type 2 causing vascular disease (HCC)    Chronic back pain 10/03/2016   Underweight 05/02/2016   GERD (gastroesophageal reflux disease) 02/13/2015   Hard of hearing 02/10/2014    Hyperlipidemia associated with type 2 diabetes mellitus (HCC)    Vitamin D  deficiency    Coronary artery calcification 08/31/2013   Lung nodule, right upper lobe 8 mm March 2014 02/04/2013   COPD GOLD 2/ AB 02/04/2013   Current smoker 02/04/2013   Home Medication(s) Prior to Admission medications   Medication Sig Start Date End Date Taking? Authorizing Provider  Accu-Chek Softclix Lancets lancets Use to check blood sugar two times daily  Dx E11.9 07/18/21   Lavell Lye A, FNP  albuterol  (PROVENTIL ) (2.5 MG/3ML) 0.083% nebulizer solution Take 3 mLs (2.5 mg total) by nebulization every 6 (six) hours as needed for wheezing or shortness of breath. 09/07/18   Lavell Lye LABOR, FNP  Alcohol Swabs (B-D SINGLE USE SWABS REGULAR) PADS Test BS BID Dx E11.9 08/02/19   Lavell Lye A, FNP  amLODipine  (NORVASC ) 10 MG tablet TAKE 1 TABLET EVERY DAY 10/01/23   Hawks, Christy A, FNP  aspirin  EC 81 MG tablet Take 1 tablet (81 mg total) by mouth daily. Swallow whole. 09/15/20   Lavell Lye LABOR, FNP  Baclofen  5 MG TABS TAKE 1 TABLET IN THE MORNING AND AT BEDTIME. 06/21/22   Lavell Lye A, FNP  Blood Glucose Calibration (ACCU-CHEK AVIVA) SOLN Use with glucose machine Dx E11.9 08/02/19   Lavell Lye A, FNP  Blood Glucose Monitoring Suppl (ACCU-CHEK AVIVA PLUS) w/Device KIT Check BS BID Dx E11.9 08/02/19   Lavell Lye A, FNP  cephALEXin  (KEFLEX ) 500 MG  capsule Take 1 capsule (500 mg total) by mouth 2 (two) times daily. 08/21/23   Lavell Lye A, FNP  donepezil  (ARICEPT ) 10 MG tablet Take 1 tablet (10 mg total) by mouth at bedtime. 11/07/22   Lavell Lye LABOR, FNP  Fluticasone -Umeclidin-Vilant (TRELEGY ELLIPTA ) 100-62.5-25 MCG/ACT AEPB One click each am 03/17/23   Darlean Ozell NOVAK, MD  gabapentin  (NEURONTIN ) 300 MG capsule Take 1 capsule (300 mg total) by mouth 3 (three) times daily. 12/25/21   Lavell Lye A, FNP  glucose blood (ACCU-CHEK GUIDE) test strip Use to check blood sugar two times daily  Dx E11.9  12/25/21   Lavell Lye LABOR, FNP  insulin  glargine (LANTUS  SOLOSTAR) 100 UNIT/ML Solostar Pen INJECT 25 UNITS INTO THE SKIN AT BEDTIME. 10/17/23   Lavell Lye LABOR, FNP  Insulin  Pen Needle (DROPLET PEN NEEDLES) 32G X 5 MM MISC Use with insulin  daily Dx E11.9 10/03/21   Lavell Lye A, FNP  losartan  (COZAAR ) 100 MG tablet TAKE 1 TABLET EVERY DAY 10/01/23   Hawks, Lye A, FNP  memantine  (NAMENDA ) 10 MG tablet Take 1 tablet (10 mg total) by mouth 2 (two) times daily. 11/07/22   Lavell Lye LABOR, FNP  pravastatin  (PRAVACHOL ) 40 MG tablet TAKE 1 TABLET EVERY DAY 10/01/23   Lavell Lye LABOR, FNP                                                                                                                                    Past Surgical History Past Surgical History:  Procedure Laterality Date   APPLICATION OF ROBOTIC ASSISTANCE FOR SPINAL PROCEDURE N/A 02/23/2019   Procedure: APPLICATION OF ROBOTIC ASSISTANCE FOR SPINAL PROCEDURE;  Surgeon: Colon Shove, MD;  Location: [facility] OR;  Service: Neurosurgery;  Laterality: N/A;   BACK SURGERY     BREAST SURGERY     radiation   CARPAL TUNNEL RELEASE     CERVICAL SPINE SURGERY     EYE SURGERY Bilateral    cataracts   GALLBLADDER SURGERY     INNER EAR SURGERY     POSTERIOR LUMBAR FUSION 4 LEVEL N/A 02/23/2019   Procedure: Thoracic ten to Lumbar two decompression with Posterior lumbar interbody fusion lumbar one-lumbar two with Osteotomy at Lumbar one-Lumbar two, Posterior arthrodesis Thoracic ten to Lumbar two with Mazor;  Surgeon: Colon Shove, MD;  Location: [facility] OR;  Service: Neurosurgery;  Laterality: N/A;   SPINE SURGERY     Family History Family History  Problem Relation Age of Onset   Heart attack Mother    Stroke Mother    Stroke Sister    Diabetes Other        sister x3, and mother   Lung disease Father        worked in [employer] cancer Neg Hx    Colon polyps Neg Hx     Social History Social History   Tobacco Use   Smoking  status: Every Day    Current packs/day: 1.00    Average packs/day: 1 pack/day for 56.3 years (56.3 ttl pk-yrs)    Types: Cigarettes    Start date: 08/11/1967   Smokeless tobacco: Never  Vaping Use   Vaping status: Never Used  Substance Use Topics   Alcohol use: No   Drug use: No   Allergies Lipitor [atorvastatin ] and Soma [carisoprodol]  Review of Systems Review of Systems  Unable to perform ROS: Dementia  Constitutional:  Negative for fever.  Respiratory:  Negative for chest tightness.   Cardiovascular:  Negative for chest pain.  Gastrointestinal:  Negative for abdominal pain and vomiting.  Genitourinary:  Positive for frequency and urgency.  Musculoskeletal:  Positive for arthralgias.  Neurological:  Negative for syncope.    Physical Exam Vital Signs  I have reviewed the triage vital signs BP 124/64   Pulse 77   Temp 99.5 F (37.5 C)   Resp 20   Ht 5' 4 (1.626 m)   Wt 52.2 kg   SpO2 97%   BMI 19.74 kg/m  Physical Exam Vitals and nursing note reviewed.  Constitutional:      General: Jamie [name] is not in acute distress.    Appearance: Jamie [name] is not ill-appearing.     Comments: Frail  HENT:     Head: Normocephalic and atraumatic.     Right Ear: External ear normal.     Left Ear: External ear normal.     Nose: Nose normal.     Mouth/Throat:     Mouth: Mucous membranes are dry.  Eyes:     General: No scleral icterus.       Right eye: No discharge.        Left eye: No discharge.     Extraocular Movements: Extraocular movements intact.     Pupils: Pupils are equal, round, and reactive to light.  Cardiovascular:     Rate and Rhythm: Normal rate and regular rhythm.     Pulses: Normal pulses.     Heart sounds: Normal heart sounds.  Pulmonary:     Effort: Pulmonary effort is normal. No respiratory distress.     Breath sounds: Normal breath sounds. No stridor.  Abdominal:     General: Abdomen is flat. There is no distension.     Palpations: Abdomen is soft.      Tenderness: There is no abdominal tenderness.  Musculoskeletal:     Cervical back: No rigidity.     Right lower leg: No edema.     Left lower leg: No edema.  Skin:    General: Skin is warm and dry.     Capillary Refill: Capillary refill takes less than 2 seconds.  Neurological:     Mental Status: Jamie [name] is alert. Mental status is at baseline.     GCS: GCS eye subscore is 4. GCS verbal subscore is 5. GCS motor subscore is 6.     Cranial Nerves: Cranial nerves 2-12 are intact. No facial asymmetry.     Sensory: Sensation is intact. No sensory deficit.     Motor: Motor function is intact.     Coordination: Coordination is intact.     Gait: Gait is intact.     Comments: Very hard of hearing   Psychiatric:        Mood and Affect: Mood normal.        Behavior: Behavior normal. Behavior is cooperative.     ED Results and Treatments Labs (all labs ordered are listed,  but only abnormal results are displayed) Labs Reviewed  CBC WITH DIFFERENTIAL/PLATELET - Abnormal; Notable for the following components:      Result Value   WBC 10.8 (*)    Neutro Abs 9.6 (*)    Lymphs Abs 0.5 (*)    All other components within normal limits  BASIC METABOLIC PANEL - Abnormal; Notable for the following components:   Potassium 3.3 (*)    Glucose, Bld 136 (*)    All other components within normal limits  URINALYSIS, W/ REFLEX TO CULTURE (INFECTION SUSPECTED) - Abnormal; Notable for the following components:   Color, Urine STRAW (*)    Glucose, UA >=500 (*)    Hgb urine dipstick SMALL (*)    All other components within normal limits  CK - Abnormal; Notable for the following components:   Total CK 1,063 (*)    All other components within normal limits  CBG MONITORING, ED - Abnormal; Notable for the following components:   Glucose-Capillary 167 (*)    All other components within normal limits  CBG MONITORING, ED - Abnormal; Notable for the following components:   Glucose-Capillary 133 (*)    All other  components within normal limits  CBG MONITORING, ED - Abnormal; Notable for the following components:   Glucose-Capillary 16 (*)    All other components within normal limits  CBG MONITORING, ED - Abnormal; Notable for the following components:   Glucose-Capillary 122 (*)    All other components within normal limits  TROPONIN I (HIGH SENSITIVITY) - Abnormal; Notable for the following components:   Troponin I (High Sensitivity) 41 (*)    All other components within normal limits  TROPONIN I (HIGH SENSITIVITY) - Abnormal; Notable for the following components:   Troponin I (High Sensitivity) 69 (*)    All other components within normal limits  RESP PANEL BY RT-PCR (RSV, FLU A&B, COVID)  [id number]  CBG MONITORING, ED  CBG MONITORING, ED                                                                                                                          Radiology CT Thoracic Spine Wo Contrast Result Date: 11/23/2023 CLINICAL DATA:  Mid back pain, fell, dementia EXAM: CT THORACIC SPINE WITHOUT CONTRAST TECHNIQUE: Multidetector CT images of the thoracic were obtained using the standard protocol without intravenous contrast. RADIATION DOSE REDUCTION: This exam was performed according to the departmental dose-optimization program which includes automated exposure control, adjustment of the mA and/or kV according to patient size and/or use of iterative reconstruction technique. COMPARISON:  03/17/2023 FINDINGS: Examination is extremely limited by patient motion throughout the study. Alignment: Alignment is grossly anatomic. Vertebrae: There are no acute or destructive bony abnormalities. Postsurgical changes are seen from thoracolumbar fusion, with intra corporal screws seen at T10, T11, and T12. Paraspinal and other soft tissues: Paraspinal soft tissues are unremarkable. Emphysema. There is bilateral bronchiectasis and mild bronchial wall thickening, with tree in bud airspace disease at the  lung bases  greatest on the left. Findings are consistent with chronic indolent infection such as MAC. No effusion or pneumothorax. Small hiatal hernia. Diffuse atherosclerosis of the aorta and coronary vasculature. Disc levels: There is no significant bony encroachment upon the central canal or neural foramina. Reconstructed images demonstrate no additional findings. IMPRESSION: 1. Limited study due to patient motion. 2. No acute thoracic spine fracture. 3. Postsurgical changes from thoracolumbar fusion. 4. Bronchiectasis and bibasilar tree in bud airspace disease, left greater than right, consistent with chronic indolent infection such as MAC. Electronically Signed   By: Ozell Daring M.D.   On: 11/23/2023 22:58   CT Lumbar Spine Wo Contrast Result Date: 11/23/2023 CLINICAL DATA:  Low back pain, trauma, fell EXAM: CT LUMBAR SPINE WITHOUT CONTRAST TECHNIQUE: Multidetector CT imaging of the lumbar spine was performed without intravenous contrast administration. Multiplanar CT image reconstructions were also generated. RADIATION DOSE REDUCTION: This exam was performed according to the departmental dose-optimization program which includes automated exposure control, adjustment of the mA and/or kV according to patient size and/or use of iterative reconstruction technique. COMPARISON:  02/23/2019 FINDINGS: Segmentation: 5 lumbar type vertebrae. Alignment: Alignment is anatomic. Vertebrae: Posterior fusion hardware is seen spanning the thoracolumbar spine, with intra corporeal screws seen at L1, L2, L3, and L4. No acute displaced fracture. No destructive bony lesions. Paraspinal and other soft tissues: Paraspinal soft tissues are unremarkable. Extensive atherosclerosis of the aorta. Disc levels: Prior discectomies at L1-2, L2-3, and L3-4. Prominent spondylosis at L4-5 and L5-S1, with vacuum phenomenon at those levels. No bony encroachment upon the central canal or neural foramina. Reconstructed images demonstrate no additional  findings. IMPRESSION: 1. Postsurgical changes from posterior thoracolumbar fusion as above. 2. No acute bony abnormalities. 3.  Aortic Atherosclerosis (ICD10-I70.0). Electronically Signed   By: Ozell Daring M.D.   On: 11/23/2023 22:54   CT Head Wo Contrast Result Date: 11/23/2023 CLINICAL DATA:  Head trauma, minor (Age >= 65y); Neck trauma (Age >= 65y) EXAM: CT HEAD WITHOUT CONTRAST CT CERVICAL SPINE WITHOUT CONTRAST TECHNIQUE: Multidetector CT imaging of the head and cervical spine was performed following the standard protocol without intravenous contrast. Multiplanar CT image reconstructions of the cervical spine were also generated. RADIATION DOSE REDUCTION: This exam was performed according to the departmental dose-optimization program which includes automated exposure control, adjustment of the mA and/or kV according to patient size and/or use of iterative reconstruction technique. COMPARISON:  None Available. FINDINGS: CT HEAD FINDINGS Brain: Slightly limited evaluation due to motion artifact. Patchy and confluent areas of decreased attenuation are noted throughout the deep and periventricular white matter of the cerebral hemispheres bilaterally, compatible with chronic microvascular ischemic disease. Age-indeterminate, likely chronic, lacunar infarction of the right basal ganglia. No evidence of large-territorial acute infarction. No parenchymal hemorrhage. No mass lesion. No extra-axial collection. No mass effect or midline shift. No hydrocephalus. Basilar cisterns are patent. Vascular: No hyperdense vessel. Atherosclerotic calcifications are present within the cavernous internal carotid arteries. Skull: No acute fracture or focal lesion. Sinuses/Orbits: Paranasal sinuses and mastoid air cells are clear. The orbits are unremarkable. Other: None. CT CERVICAL SPINE FINDINGS Alignment: Reversal normal cervical lordosis likely due to positioning and degenerative changes. Skull base and vertebrae: Multilevel  moderate degenerative changes of the spine. C6-C7 anterior cervical discectomy and fusion. No associated severe osseous neural foraminal or central canal stenosis. No acute fracture. No aggressive appearing focal osseous lesion or focal pathologic process. Soft tissues and spinal canal: No prevertebral fluid or swelling. No visible canal hematoma.  Upper chest: Emphysematous changes. Other: None. IMPRESSION: 1. No acute intracranial hemorrhage. 2. Age-indeterminate, likely chronic, lacunar infarction of the right basal ganglia. 3. No acute displaced fracture or traumatic listhesis of the cervical spine. 4.  Emphysema (ICD10-J43.9). Electronically Signed   By: Morgane  Naveau M.D.   On: 11/23/2023 22:49   CT Cervical Spine Wo Contrast Result Date: 11/23/2023 CLINICAL DATA:  Head trauma, minor (Age >= 65y); Neck trauma (Age >= 65y) EXAM: CT HEAD WITHOUT CONTRAST CT CERVICAL SPINE WITHOUT CONTRAST TECHNIQUE: Multidetector CT imaging of the head and cervical spine was performed following the standard protocol without intravenous contrast. Multiplanar CT image reconstructions of the cervical spine were also generated. RADIATION DOSE REDUCTION: This exam was performed according to the departmental dose-optimization program which includes automated exposure control, adjustment of the mA and/or kV according to patient size and/or use of iterative reconstruction technique. COMPARISON:  None Available. FINDINGS: CT HEAD FINDINGS Brain: Slightly limited evaluation due to motion artifact. Patchy and confluent areas of decreased attenuation are noted throughout the deep and periventricular white matter of the cerebral hemispheres bilaterally, compatible with chronic microvascular ischemic disease. Age-indeterminate, likely chronic, lacunar infarction of the right basal ganglia. No evidence of large-territorial acute infarction. No parenchymal hemorrhage. No mass lesion. No extra-axial collection. No mass effect or midline  shift. No hydrocephalus. Basilar cisterns are patent. Vascular: No hyperdense vessel. Atherosclerotic calcifications are present within the cavernous internal carotid arteries. Skull: No acute fracture or focal lesion. Sinuses/Orbits: Paranasal sinuses and mastoid air cells are clear. The orbits are unremarkable. Other: None. CT CERVICAL SPINE FINDINGS Alignment: Reversal normal cervical lordosis likely due to positioning and degenerative changes. Skull base and vertebrae: Multilevel moderate degenerative changes of the spine. C6-C7 anterior cervical discectomy and fusion. No associated severe osseous neural foraminal or central canal stenosis. No acute fracture. No aggressive appearing focal osseous lesion or focal pathologic process. Soft tissues and spinal canal: No prevertebral fluid or swelling. No visible canal hematoma. Upper chest: Emphysematous changes. Other: None. IMPRESSION: 1. No acute intracranial hemorrhage. 2. Age-indeterminate, likely chronic, lacunar infarction of the right basal ganglia. 3. No acute displaced fracture or traumatic listhesis of the cervical spine. 4.  Emphysema (ICD10-J43.9). Electronically Signed   By: Morgane  Naveau M.D.   On: 11/23/2023 22:49   DG Pelvis 1-2 Views Result Date: 11/23/2023 CLINICAL DATA:  fall EXAM: PELVIS - 1-2 VIEW COMPARISON:  None Available. FINDINGS: Limited evaluation due to overlapping osseous structures and overlying soft tissues. There is no evidence of pelvic fracture or diastasis. No acute displaced fracture or dislocation of the hips on frontal view. No pelvic bone lesions are seen. Lumbar surgical hardware partially visualized. Vascular calcifications. IMPRESSION: Negative for acute traumatic injury. Limited evaluation due to overlapping osseous structures and overlying soft tissues. Electronically Signed   By: Morgane  Naveau M.D.   On: 11/23/2023 22:34   DG Chest Portable 1 View Result Date: 11/23/2023 CLINICAL DATA:  fall EXAM: PORTABLE CHEST  1 VIEW COMPARISON:  Chest x-ray 03/17/2023 FINDINGS: The heart and mediastinal contours are unchanged. Atherosclerotic plaque. No focal consolidation. Chronic coarsened interstitial markings with no overt pulmonary edema. No pleural effusion. No pneumothorax. No acute osseous abnormality. Thoracolumbar surgical hardware noted. IMPRESSION: 1. No active disease. 2. Aortic Atherosclerosis (ICD10-I70.0) and Emphysema (ICD10-J43.9). Electronically Signed   By: Morgane  Naveau M.D.   On: 11/23/2023 20:57    Pertinent labs & imaging results that were available during my care of the patient were reviewed by me and considered in  my medical decision making (see MDM for details).  Medications Ordered in ED Medications  potassium chloride  SA (KLOR-CON  M) CR tablet 40 mEq (has no administration in time range)  sodium chloride  0.9 % bolus 1,000 mL (0 mLs Intravenous Stopped 11/23/23 2323)  OLANZapine  zydis (ZYPREXA ) disintegrating tablet 5 mg (5 mg Oral Given 11/23/23 2053)  morphine  (PF) 4 MG/ML injection 4 mg (4 mg Intravenous Given 11/23/23 2310)  ondansetron  (ZOFRAN ) injection 4 mg (4 mg Intravenous Given 11/23/23 2310)  dextrose  10 % infusion ( Intravenous New Bag/Given 11/23/23 2309)                                                                                                                                     Procedures .Critical Care  Performed by: Elnor Jayson LABOR, DO Authorized by: Elnor Jayson LABOR, DO   Critical care provider statement:    Critical care time (minutes):  40   Critical care time was exclusive of:  Separately billable procedures and treating other patients   Critical care was necessary to treat or prevent imminent or life-threatening deterioration of the following conditions:  Endocrine crisis   Critical care was time spent personally by me on the following activities:  Development of treatment plan with patient or surrogate, discussions with consultants, evaluation of patient's response to  treatment, examination of patient, ordering and review of laboratory studies, ordering and review of radiographic studies, ordering and performing treatments and interventions, pulse oximetry, re-evaluation of patient's condition, review of old charts and obtaining history from patient or surrogate   Care discussed with: admitting provider     (including critical care time)  Medical Decision Making / ED Course    Medical Decision Making:    ALAYNE ESTRELLA is a 73 y.o. female with past medical history as below, significant for COPD, IDDM, Alzheimer's dementia with agitation, very hard of hearing who presents to the ED with complaint of fall, hypoglycemia, agitation. . The complaint involves an extensive differential diagnosis and also carries with it a high risk of complications and morbidity.  Serious etiology was considered. Ddx includes but is not limited to: Differential diagnoses for altered mental status includes but is not exclusive to alcohol, illicit or prescription medications, intracranial pathology such as stroke, intracerebral hemorrhage, fever or infectious causes including sepsis, hypoxemia, uremia, trauma, endocrine related disorders such as diabetes, hypoglycemia, thyroid -related diseases, etc.   Complete initial physical exam performed, notably the patient was in no acute distress, Jamie [name] is hypothermic on arrival.  Acting at baseline currently per husband.    Reviewed and confirmed nursing documentation for past medical history, family history, social history.  Vital signs reviewed.     Clinical Course as of 11/23/23 2341  [place] Nov 23, 2023  2052 Jamie [name] has dementia, Jamie [name] is acutely agitated, unable to tolerate x-rays.  Will give Zyprexa  and reattempt [SG]  2134 CK Total(!): 1,063 Pt with downtime potentially  of around 4-5 hours [SG]  2243 CBG was 16 on recheck, ill give d50  [SG]    Clinical Course User Index [SG] Elnor Jayson LABOR, DO    Brief summary: 73 year old female  history as above including IDDM, Alzheimer's dementia here with fall, hypoglycemia, agitation.  Patient with fall at home, hypoglycemic, hypothermic.  Glucose low and EMS arrival.  Improved with D50.  Her core temperature was 93.8 on arrival here, mental status is baseline per family.  CK over thousand, renal function stable. Her glucose is low, Jamie [name] got D50 pta, rpt glucose here was 16 after a couple hours. Will rpt D50 amp and start D10 infusion. Her fall seems likely secondary to her hypoglycemia. Jamie [name] appears to be having episodes of sundowning. Unclear what has provoked this, progressive dementia is certainly a consideration. Jamie [name] has rhabdomyolysis from the fall w/ prolonged downtime. Jamie [name] will require admission for this persistent hypoglycemia.   [facility] admitting               Additional history obtained: -Additional history obtained from family -External records from outside source obtained and reviewed including: Chart review including previous notes, labs, imaging, consultation notes including  Home medications, primary care recommendation, prior labs and imaging   Lab Tests: -I ordered, reviewed, and interpreted labs.   The pertinent results include:   Labs Reviewed  CBC WITH DIFFERENTIAL/PLATELET - Abnormal; Notable for the following components:      Result Value   WBC 10.8 (*)    Neutro Abs 9.6 (*)    Lymphs Abs 0.5 (*)    All other components within normal limits  BASIC METABOLIC PANEL - Abnormal; Notable for the following components:   Potassium 3.3 (*)    Glucose, Bld 136 (*)    All other components within normal limits  URINALYSIS, W/ REFLEX TO CULTURE (INFECTION SUSPECTED) - Abnormal; Notable for the following components:   Color, Urine STRAW (*)    Glucose, UA >=500 (*)    Hgb urine dipstick SMALL (*)    All other components within normal limits  CK - Abnormal; Notable for the following components:   Total CK 1,063 (*)    All other components within normal  limits  CBG MONITORING, ED - Abnormal; Notable for the following components:   Glucose-Capillary 167 (*)    All other components within normal limits  CBG MONITORING, ED - Abnormal; Notable for the following components:   Glucose-Capillary 133 (*)    All other components within normal limits  CBG MONITORING, ED - Abnormal; Notable for the following components:   Glucose-Capillary 16 (*)    All other components within normal limits  CBG MONITORING, ED - Abnormal; Notable for the following components:   Glucose-Capillary 122 (*)    All other components within normal limits  TROPONIN I (HIGH SENSITIVITY) - Abnormal; Notable for the following components:   Troponin I (High Sensitivity) 41 (*)    All other components within normal limits  TROPONIN I (HIGH SENSITIVITY) - Abnormal; Notable for the following components:   Troponin I (High Sensitivity) 69 (*)    All other components within normal limits  RESP PANEL BY RT-PCR (RSV, FLU A&B, COVID)  RVPGX2  CBG MONITORING, ED  CBG MONITORING, ED    Notable for as above   EKG   EKG Interpretation Date/Time:  Sunday November 23 2023 19:42:19 EST Ventricular Rate:  77 PR Interval:  183 QRS Duration:  90 QT Interval:  403 QTC Calculation: 457 R  Axis:   67  Text Interpretation: Sinus arrhythmia Probable left atrial enlargement Baseline wander in lead(s) V2 Interpretation limited secondary to artifact similar to prior no stemi Confirmed by Elnor Savant ([id number]) on 11/23/2023 9:06:04 PM         Imaging Studies ordered: I ordered imaging studies including CTH CT spine, CXR PELVIS xr I independently visualized the following imaging with scope of interpretation limited to determining acute life threatening conditions related to emergency care; findings noted above I independently visualized and interpreted imaging. I agree with the radiologist interpretation   Medicines ordered and prescription drug management: Meds ordered this encounter   Medications   sodium chloride  0.9 % bolus 1,000 mL   OLANZapine  zydis (ZYPREXA ) disintegrating tablet 5 mg   morphine  (PF) 4 MG/ML injection 4 mg   ondansetron  (ZOFRAN ) injection 4 mg   DISCONTD: dextrose  50 % solution 50 mL   dextrose  50 % solution    Chester, Abigail A: cabinet override   dextrose  10 % infusion   dextrose  50 % solution    Pruitt, Ginger M: cabinet override   potassium chloride  SA (KLOR-CON  M) CR tablet 40 mEq    -I have reviewed the patients home medicines and have made adjustments as needed   Consultations Obtained: na   Cardiac Monitoring: The patient was maintained on a cardiac monitor.  I personally viewed and interpreted the cardiac monitored which showed an underlying rhythm of: NSR Continuous pulse oximetry interpreted by myself, 97% on RA.    Social Determinants of Health:  Diagnosis or treatment significantly limited by social determinants of health: current smoker   Reevaluation: After the interventions noted above, I reevaluated the patient and found that they have improved  Co morbidities that complicate the patient evaluation  Past Medical History:  Diagnosis Date   Anxiety    Breast cancer (HCC)    COPD (chronic obstructive pulmonary disease) (HCC)    Deaf    Depression    Diabetes (HCC)    HOH (hard of hearing)    HTN (hypertension)    Hyperlipidemia    Personal history of radiation therapy    Vitamin D  deficiency       Dispostion: Disposition decision including need for hospitalization was considered, and patient admitted to the hospital.    Final Clinical Impression(s) / ED Diagnoses Final diagnoses:  Fall, initial encounter  Dementia with behavioral disturbance (HCC)  Traumatic rhabdomyolysis, initial encounter (HCC)  Hypoglycemia        Elnor Savant LABOR, DO 11/23/23 [id number]

## 2023-11-23 NOTE — H&P (Signed)
 History and Physical    Patient: Jamie [name] FMW:[id number] DOB: 06/10/1951 DOA: 11/23/2023 DOS: the patient was seen and examined on 11/24/2023 PCP: Lavell Bari LABOR, FNP  Patient coming from: Home  Chief Complaint:  Chief Complaint  Patient presents with   Hypoglycemia   HPI: Jamie [name] is a 73 y.o. female with medical history significant of hypertension, hyperlipidemia, T2DM, COPD, dementia who presents emergency department from home via EMS due to complaints of fall, agitation and hypoglycemia.  Patient was unable to provide history, history was obtained from ED physician and ED medical record.  Per report, patient's husband (caretaker) called patient from work around 3 PM, but was unable to communicate with her, on returning home from work around 8 PM, patient was on the floor with decreased responsiveness.  EMS was activated, on arrival of EMS team, blood sugar level was noted to be at 40 and patient was a little bit cool to touch.  D50 was given with improved mental status.  Apparently, patient has been increasingly become agitated at nighttime since last 3 to 4 days, though she acts normally during the daytime.  Patient was also reported to have had increased urinary frequency and urgency and has been eating less within the last 1 week, though she continued to take her regular insulin .  There was no report of chest pain, nausea, vomiting, abdominal pain, fever.  ED Course: In the emergency department, patient was hypothermic with a temperature of 93.41F, BP was 154/74, other vital signs were within normal range.  Workup in the ED showed normal CBC except for WBC of 10.8.  BMP was normal except for potassium of 3.3 and blood glucose of 136, troponin 41 > 69, total CK 1063, urinalysis was positive for glycosuria.  Influenza A, B, SARS, respiratory, RSV was negative. Chest x-ray showed no active disease CT thoracic spine showed no acute fracture CT lumbar spine showed no acute bony  abnormalities CT head without contrast showed no acute hemorrhage CT cervical spine showed no acute displaced fracture or traumatic listhesis of the cervical spine IV morphine  4 mg x 1, Zofran  x 1 was given, Zyprexa  was provided.  IV hydration with 1 L NS was given.  Patient was started on D10 due to hypoglycemia.  Hospitalist was asked admit patient for further evaluation and management.  Review of Systems: Review of systems as noted in the HPI. All other systems reviewed and are negative.   Past Medical History:  Diagnosis Date   Anxiety    Breast cancer (HCC)    COPD (chronic obstructive pulmonary disease) (HCC)    Deaf    Depression    Diabetes (HCC)    HOH (hard of hearing)    HTN (hypertension)    Hyperlipidemia    Personal history of radiation therapy    Vitamin D  deficiency    Past Surgical History:  Procedure Laterality Date   APPLICATION OF ROBOTIC ASSISTANCE FOR SPINAL PROCEDURE N/A 02/23/2019   Procedure: APPLICATION OF ROBOTIC ASSISTANCE FOR SPINAL PROCEDURE;  Surgeon: Colon Shove, MD;  Location: [facility] OR;  Service: Neurosurgery;  Laterality: N/A;   BACK SURGERY     BREAST SURGERY     radiation   CARPAL TUNNEL RELEASE     CERVICAL SPINE SURGERY     EYE SURGERY Bilateral    cataracts   GALLBLADDER SURGERY     INNER EAR SURGERY     POSTERIOR LUMBAR FUSION 4 LEVEL N/A 02/23/2019   Procedure: Thoracic ten to  Lumbar two decompression with Posterior lumbar interbody fusion lumbar one-lumbar two with Osteotomy at Lumbar one-Lumbar two, Posterior arthrodesis Thoracic ten to Lumbar two with Mazor;  Surgeon: Colon Shove, MD;  Location: [facility] OR;  Service: Neurosurgery;  Laterality: N/A;   SPINE SURGERY      Social History:  reports that she has been smoking cigarettes. She started smoking about 56 years ago. She has a 56.3 pack-year smoking history. She has never used smokeless tobacco. She reports that she does not drink alcohol and does not use drugs.   Allergies   Allergen Reactions   Lipitor [Atorvastatin ] Other (See Comments)    Constipation    Soma [Carisoprodol] Nausea And Vomiting    Family History  Problem Relation Age of Onset   Heart attack Mother    Stroke Mother    Stroke Sister    Diabetes Other        sister x3, and mother   Lung disease Father        worked in [employer] cancer Neg Hx    Colon polyps Neg Hx      Prior to Admission medications   Medication Sig Start Date End Date Taking? Authorizing Provider  Accu-Chek Softclix Lancets lancets Use to check blood sugar two times daily  Dx E11.9 07/18/21   Lavell Lye A, FNP  albuterol  (PROVENTIL ) (2.5 MG/3ML) 0.083% nebulizer solution Take 3 mLs (2.5 mg total) by nebulization every 6 (six) hours as needed for wheezing or shortness of breath. 09/07/18   Lavell Lye LABOR, FNP  Alcohol Swabs (B-D SINGLE USE SWABS REGULAR) PADS Test BS BID Dx E11.9 08/02/19   Lavell Lye A, FNP  amLODipine  (NORVASC ) 10 MG tablet TAKE 1 TABLET EVERY DAY 10/01/23   Lavell Lye A, FNP  aspirin  EC 81 MG tablet Take 1 tablet (81 mg total) by mouth daily. Swallow whole. 09/15/20   Lavell Lye LABOR, FNP  Baclofen  5 MG TABS TAKE 1 TABLET IN THE MORNING AND AT BEDTIME. 06/21/22   Lavell Lye A, FNP  Blood Glucose Calibration (ACCU-CHEK AVIVA) SOLN Use with glucose machine Dx E11.9 08/02/19   Lavell Lye A, FNP  Blood Glucose Monitoring Suppl (ACCU-CHEK AVIVA PLUS) w/Device KIT Check BS BID Dx E11.9 08/02/19   Lavell Lye A, FNP  cephALEXin  (KEFLEX ) 500 MG capsule Take 1 capsule (500 mg total) by mouth 2 (two) times daily. 08/21/23   Lavell Lye A, FNP  donepezil  (ARICEPT ) 10 MG tablet Take 1 tablet (10 mg total) by mouth at bedtime. 11/07/22   Lavell Lye LABOR, FNP  Fluticasone -Umeclidin-Vilant (TRELEGY ELLIPTA ) 100-62.5-25 MCG/ACT AEPB One click each am 03/17/23   Darlean Ozell NOVAK, MD  gabapentin  (NEURONTIN ) 300 MG capsule Take 1 capsule (300 mg total) by mouth 3 (three) times daily.  12/25/21   Lavell Lye A, FNP  glucose blood (ACCU-CHEK GUIDE) test strip Use to check blood sugar two times daily  Dx E11.9 12/25/21   Lavell Lye LABOR, FNP  insulin  glargine (LANTUS  SOLOSTAR) 100 UNIT/ML Solostar Pen INJECT 25 UNITS INTO THE SKIN AT BEDTIME. 10/17/23   Hawks, Lye A, FNP  Insulin  Pen Needle (DROPLET PEN NEEDLES) 32G X 5 MM MISC Use with insulin  daily Dx E11.9 10/03/21   Lavell Lye A, FNP  losartan  (COZAAR ) 100 MG tablet TAKE 1 TABLET EVERY DAY 10/01/23   Hawks, Lye A, FNP  memantine  (NAMENDA ) 10 MG tablet Take 1 tablet (10 mg total) by mouth 2 (two) times daily. 11/07/22  Lavell Lye A, FNP  pravastatin  (PRAVACHOL ) 40 MG tablet TAKE 1 TABLET EVERY DAY 10/01/23   Lavell Lye LABOR, FNP    Physical Exam: BP (!) 109/55   Pulse 66   Temp 99.5 F (37.5 C)   Resp 14   Ht 5' 4 (1.626 m)   Wt 52.2 kg   SpO2 98%   BMI 19.74 kg/m   General: 73 y.o. year-old female somnolent, though easily arousable and in no acute distress.   HEENT: NCAT, PERRL, dry mucous membrane Neck: Supple, trachea medial Cardiovascular: Regular rate and rhythm with no rubs or gallops.  No thyromegaly or JVD noted.  No lower extremity edema. 2/4 pulses in all 4 extremities. Respiratory: Clear to auscultation with no wheezes or rales. Good inspiratory effort. Abdomen: Soft, nontender nondistended with normal bowel sounds x4 quadrants. Muskuloskeletal: No cyanosis, clubbing or edema noted bilaterally Neuro: CN II-XII intact, strength 5/5 x 4, sensation, reflexes intact Skin: No ulcerative lesions noted or rashes Psychiatry: Mood is appropriate for condition and setting          Labs on Admission:  Basic Metabolic Panel: Recent Labs  Lab 11/23/23 2016  NA 137  K 3.3*  CL 104  CO2 23  GLUCOSE 136*  BUN 16  CREATININE 0.73  CALCIUM  9.0   Liver Function Tests: No results for input(s): AST, ALT, ALKPHOS, BILITOT, PROT, ALBUMIN  in the last 168 hours. No results for  input(s): LIPASE, AMYLASE in the last 168 hours. No results for input(s): AMMONIA in the last 168 hours. CBC: Recent Labs  Lab 11/23/23 2016  WBC 10.8*  NEUTROABS 9.6*  HGB 12.3  HCT 38.6  MCV 84.8  PLT 175   Cardiac Enzymes: Recent Labs  Lab 11/23/23 2026  CKTOTAL 1,063*    BNP (last 3 results) No results for input(s): BNP in the last 8760 hours.  ProBNP (last 3 results) No results for input(s): PROBNP in the last 8760 hours.  CBG: Recent Labs  Lab 11/23/23 2239 11/23/23 2323 11/23/23 2358 11/24/23 0052 11/24/23 0206  GLUCAP 16* 122* 96 61* 85    Radiological Exams on Admission: CT Thoracic Spine Wo Contrast Result Date: 11/23/2023 CLINICAL DATA:  Mid back pain, fell, dementia EXAM: CT THORACIC SPINE WITHOUT CONTRAST TECHNIQUE: Multidetector CT images of the thoracic were obtained using the standard protocol without intravenous contrast. RADIATION DOSE REDUCTION: This exam was performed according to the departmental dose-optimization program which includes automated exposure control, adjustment of the mA and/or kV according to patient size and/or use of iterative reconstruction technique. COMPARISON:  03/17/2023 FINDINGS: Examination is extremely limited by patient motion throughout the study. Alignment: Alignment is grossly anatomic. Vertebrae: There are no acute or destructive bony abnormalities. Postsurgical changes are seen from thoracolumbar fusion, with intra corporal screws seen at T10, T11, and T12. Paraspinal and other soft tissues: Paraspinal soft tissues are unremarkable. Emphysema. There is bilateral bronchiectasis and mild bronchial wall thickening, with tree in bud airspace disease at the lung bases greatest on the left. Findings are consistent with chronic indolent infection such as MAC. No effusion or pneumothorax. Small hiatal hernia. Diffuse atherosclerosis of the aorta and coronary vasculature. Disc levels: There is no significant bony encroachment  upon the central canal or neural foramina. Reconstructed images demonstrate no additional findings. IMPRESSION: 1. Limited study due to patient motion. 2. No acute thoracic spine fracture. 3. Postsurgical changes from thoracolumbar fusion. 4. Bronchiectasis and bibasilar tree in bud airspace disease, left greater than right, consistent with chronic indolent  infection such as MAC. Electronically Signed   By: Ozell Daring M.D.   On: 11/23/2023 22:58   CT Lumbar Spine Wo Contrast Result Date: 11/23/2023 CLINICAL DATA:  Low back pain, trauma, fell EXAM: CT LUMBAR SPINE WITHOUT CONTRAST TECHNIQUE: Multidetector CT imaging of the lumbar spine was performed without intravenous contrast administration. Multiplanar CT image reconstructions were also generated. RADIATION DOSE REDUCTION: This exam was performed according to the departmental dose-optimization program which includes automated exposure control, adjustment of the mA and/or kV according to patient size and/or use of iterative reconstruction technique. COMPARISON:  02/23/2019 FINDINGS: Segmentation: 5 lumbar type vertebrae. Alignment: Alignment is anatomic. Vertebrae: Posterior fusion hardware is seen spanning the thoracolumbar spine, with intra corporeal screws seen at L1, L2, L3, and L4. No acute displaced fracture. No destructive bony lesions. Paraspinal and other soft tissues: Paraspinal soft tissues are unremarkable. Extensive atherosclerosis of the aorta. Disc levels: Prior discectomies at L1-2, L2-3, and L3-4. Prominent spondylosis at L4-5 and L5-S1, with vacuum phenomenon at those levels. No bony encroachment upon the central canal or neural foramina. Reconstructed images demonstrate no additional findings. IMPRESSION: 1. Postsurgical changes from posterior thoracolumbar fusion as above. 2. No acute bony abnormalities. 3.  Aortic Atherosclerosis (ICD10-I70.0). Electronically Signed   By: Ozell Daring M.D.   On: 11/23/2023 22:54   CT Head Wo  Contrast Result Date: 11/23/2023 CLINICAL DATA:  Head trauma, minor (Age >= 65y); Neck trauma (Age >= 65y) EXAM: CT HEAD WITHOUT CONTRAST CT CERVICAL SPINE WITHOUT CONTRAST TECHNIQUE: Multidetector CT imaging of the head and cervical spine was performed following the standard protocol without intravenous contrast. Multiplanar CT image reconstructions of the cervical spine were also generated. RADIATION DOSE REDUCTION: This exam was performed according to the departmental dose-optimization program which includes automated exposure control, adjustment of the mA and/or kV according to patient size and/or use of iterative reconstruction technique. COMPARISON:  None Available. FINDINGS: CT HEAD FINDINGS Brain: Slightly limited evaluation due to motion artifact. Patchy and confluent areas of decreased attenuation are noted throughout the deep and periventricular white matter of the cerebral hemispheres bilaterally, compatible with chronic microvascular ischemic disease. Age-indeterminate, likely chronic, lacunar infarction of the right basal ganglia. No evidence of large-territorial acute infarction. No parenchymal hemorrhage. No mass lesion. No extra-axial collection. No mass effect or midline shift. No hydrocephalus. Basilar cisterns are patent. Vascular: No hyperdense vessel. Atherosclerotic calcifications are present within the cavernous internal carotid arteries. Skull: No acute fracture or focal lesion. Sinuses/Orbits: Paranasal sinuses and mastoid air cells are clear. The orbits are unremarkable. Other: None. CT CERVICAL SPINE FINDINGS Alignment: Reversal normal cervical lordosis likely due to positioning and degenerative changes. Skull base and vertebrae: Multilevel moderate degenerative changes of the spine. C6-C7 anterior cervical discectomy and fusion. No associated severe osseous neural foraminal or central canal stenosis. No acute fracture. No aggressive appearing focal osseous lesion or focal pathologic  process. Soft tissues and spinal canal: No prevertebral fluid or swelling. No visible canal hematoma. Upper chest: Emphysematous changes. Other: None. IMPRESSION: 1. No acute intracranial hemorrhage. 2. Age-indeterminate, likely chronic, lacunar infarction of the right basal ganglia. 3. No acute displaced fracture or traumatic listhesis of the cervical spine. 4.  Emphysema (ICD10-J43.9). Electronically Signed   By: Morgane  Naveau M.D.   On: 11/23/2023 22:49   CT Cervical Spine Wo Contrast Result Date: 11/23/2023 CLINICAL DATA:  Head trauma, minor (Age >= 65y); Neck trauma (Age >= 65y) EXAM: CT HEAD WITHOUT CONTRAST CT CERVICAL SPINE WITHOUT CONTRAST TECHNIQUE: Multidetector CT  imaging of the head and cervical spine was performed following the standard protocol without intravenous contrast. Multiplanar CT image reconstructions of the cervical spine were also generated. RADIATION DOSE REDUCTION: This exam was performed according to the departmental dose-optimization program which includes automated exposure control, adjustment of the mA and/or kV according to patient size and/or use of iterative reconstruction technique. COMPARISON:  None Available. FINDINGS: CT HEAD FINDINGS Brain: Slightly limited evaluation due to motion artifact. Patchy and confluent areas of decreased attenuation are noted throughout the deep and periventricular white matter of the cerebral hemispheres bilaterally, compatible with chronic microvascular ischemic disease. Age-indeterminate, likely chronic, lacunar infarction of the right basal ganglia. No evidence of large-territorial acute infarction. No parenchymal hemorrhage. No mass lesion. No extra-axial collection. No mass effect or midline shift. No hydrocephalus. Basilar cisterns are patent. Vascular: No hyperdense vessel. Atherosclerotic calcifications are present within the cavernous internal carotid arteries. Skull: No acute fracture or focal lesion. Sinuses/Orbits: Paranasal sinuses  and mastoid air cells are clear. The orbits are unremarkable. Other: None. CT CERVICAL SPINE FINDINGS Alignment: Reversal normal cervical lordosis likely due to positioning and degenerative changes. Skull base and vertebrae: Multilevel moderate degenerative changes of the spine. C6-C7 anterior cervical discectomy and fusion. No associated severe osseous neural foraminal or central canal stenosis. No acute fracture. No aggressive appearing focal osseous lesion or focal pathologic process. Soft tissues and spinal canal: No prevertebral fluid or swelling. No visible canal hematoma. Upper chest: Emphysematous changes. Other: None. IMPRESSION: 1. No acute intracranial hemorrhage. 2. Age-indeterminate, likely chronic, lacunar infarction of the right basal ganglia. 3. No acute displaced fracture or traumatic listhesis of the cervical spine. 4.  Emphysema (ICD10-J43.9). Electronically Signed   By: Morgane  Naveau M.D.   On: 11/23/2023 22:49   DG Pelvis 1-2 Views Result Date: 11/23/2023 CLINICAL DATA:  fall EXAM: PELVIS - 1-2 VIEW COMPARISON:  None Available. FINDINGS: Limited evaluation due to overlapping osseous structures and overlying soft tissues. There is no evidence of pelvic fracture or diastasis. No acute displaced fracture or dislocation of the hips on frontal view. No pelvic bone lesions are seen. Lumbar surgical hardware partially visualized. Vascular calcifications. IMPRESSION: Negative for acute traumatic injury. Limited evaluation due to overlapping osseous structures and overlying soft tissues. Electronically Signed   By: Morgane  Naveau M.D.   On: 11/23/2023 22:34   DG Chest Portable 1 View Result Date: 11/23/2023 CLINICAL DATA:  fall EXAM: PORTABLE CHEST 1 VIEW COMPARISON:  Chest x-ray 03/17/2023 FINDINGS: The heart and mediastinal contours are unchanged. Atherosclerotic plaque. No focal consolidation. Chronic coarsened interstitial markings with no overt pulmonary edema. No pleural effusion. No  pneumothorax. No acute osseous abnormality. Thoracolumbar surgical hardware noted. IMPRESSION: 1. No active disease. 2. Aortic Atherosclerosis (ICD10-I70.0) and Emphysema (ICD10-J43.9). Electronically Signed   By: Morgane  Naveau M.D.   On: 11/23/2023 20:57    EKG: I independently viewed the EKG done and my findings are as followed: Sinus arrhythmia at a rate of 77 bpm  Assessment/Plan Present on Admission:  Essential hypertension, benign  Mixed hyperlipidemia  COPD (chronic obstructive pulmonary disease) (HCC)  Active Problems:   COPD (chronic obstructive pulmonary disease) (HCC)   Mixed hyperlipidemia   Essential hypertension, benign   Diabetes mellitus due to underlying condition with hypoglycemia (HCC)   Rhabdomyolysis   Elevated troponin   Hypokalemia   Hypothermia   Alzheimer's dementia (HCC)  Diabetes with hypoglycemia This is possibly due to compliance with insulin  despite poor oral intake Continue D10 infusion and continue to  monitor CBG  Rhabdomyolysis Total CK 1063 Continue IV hydration and continue to monitor CK  Elevated troponin possibly secondary to type II demand ischemia Troponin 41 > 69; patient denies chest pain, continue to trend troponin  Hypothermia-resolved Temperature 93.8 F on arrival to the ED.  This has since normalized  Hypokalemia K+ 3.3, this will be replenished  Essential hypertension BP meds will be held due to soft BPs  Mixed hyperlipidemia Continue pravastatin   COPD (not in acute exacerbation) Continue albuterol  as needed Continue Breo and Incruse Ellipta   Alzheimer's dementia Continue Aricept  and Namenda    DVT prophylaxis: Lovenox   Code Status: Full code  Family Communication: None at bedside  Consults: None  Severity of Illness: The appropriate patient status for this patient is INPATIENT. Inpatient status is judged to be reasonable and necessary in order to provide the required intensity of service to ensure the  patient's safety. The patient's presenting symptoms, physical exam findings, and initial radiographic and laboratory data in the context of their chronic comorbidities is felt to place them at high risk for further clinical deterioration. Furthermore, it is not anticipated that the patient will be medically stable for discharge from the hospital within 2 midnights of admission.   * I certify that at the point of admission it is my clinical judgment that the patient will require inpatient hospital care spanning beyond 2 midnights from the point of admission due to high intensity of service, high risk for further deterioration and high frequency of surveillance required.*  Author: Katreena Schupp, DO 11/24/2023 2:07 AM  For on call review [email or web address].
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
## 2023-11-24 ENCOUNTER — Encounter (HOSPITAL_COMMUNITY): Payer: Self-pay | Admitting: Internal Medicine

## 2023-11-24 DIAGNOSIS — G300 Alzheimer's disease with early onset: Secondary | ICD-10-CM

## 2023-11-24 DIAGNOSIS — T68XXXD Hypothermia, subsequent encounter: Secondary | ICD-10-CM

## 2023-11-24 DIAGNOSIS — I10 Essential (primary) hypertension: Secondary | ICD-10-CM

## 2023-11-24 DIAGNOSIS — E08649 Diabetes mellitus due to underlying condition with hypoglycemia without coma: Secondary | ICD-10-CM | POA: Diagnosis present

## 2023-11-24 DIAGNOSIS — R7989 Other specified abnormal findings of blood chemistry: Secondary | ICD-10-CM | POA: Diagnosis present

## 2023-11-24 DIAGNOSIS — M6282 Rhabdomyolysis: Secondary | ICD-10-CM | POA: Diagnosis not present

## 2023-11-24 DIAGNOSIS — J438 Other emphysema: Secondary | ICD-10-CM

## 2023-11-24 DIAGNOSIS — F0280 Dementia in other diseases classified elsewhere without behavioral disturbance: Secondary | ICD-10-CM | POA: Diagnosis present

## 2023-11-24 DIAGNOSIS — E876 Hypokalemia: Secondary | ICD-10-CM | POA: Diagnosis present

## 2023-11-24 DIAGNOSIS — T68XXXA Hypothermia, initial encounter: Secondary | ICD-10-CM | POA: Diagnosis present

## 2023-11-24 DIAGNOSIS — F02C0 Dementia in other diseases classified elsewhere, severe, without behavioral disturbance, psychotic disturbance, mood disturbance, and anxiety: Secondary | ICD-10-CM

## 2023-11-24 LAB — COMPREHENSIVE METABOLIC PANEL
ALT: 22 U/L (ref 0–44)
AST: 30 U/L (ref 15–41)
Albumin: 3.1 g/dL — ABNORMAL LOW (ref 3.5–5.0)
Alkaline Phosphatase: 68 U/L (ref 38–126)
Anion gap: 9 (ref 5–15)
BUN: 14 mg/dL (ref 8–23)
CO2: 23 mmol/L (ref 22–32)
Calcium: 8.7 mg/dL — ABNORMAL LOW (ref 8.9–10.3)
Chloride: 107 mmol/L (ref 98–111)
Creatinine, Ser: 0.67 mg/dL (ref 0.44–1.00)
GFR, Estimated: >60 mL/min (ref >60)
Glucose, Bld: 97 mg/dL (ref 70–99)
Potassium: 3.4 mmol/L — ABNORMAL LOW (ref 3.5–5.1)
Sodium: 139 mmol/L (ref 135–145)
Total Bilirubin: 0.6 mg/dL (ref 0.0–1.2)
Total Protein: 5.9 g/dL — ABNORMAL LOW (ref 6.5–8.1)

## 2023-11-24 LAB — CBC
HCT: 37.7 % (ref 36.0–46.0)
Hemoglobin: 11.8 g/dL — ABNORMAL LOW (ref 12.0–15.0)
MCH: 26.8 pg (ref 26.0–34.0)
MCHC: 31.3 g/dL (ref 30.0–36.0)
MCV: 85.7 fL (ref 80.0–100.0)
Platelets: 165 10*3/uL (ref 150–400)
RBC: 4.4 MIL/uL (ref 3.87–5.11)
RDW: 14.4 % (ref 11.5–15.5)
WBC: 7.3 10*3/uL (ref 4.0–10.5)
nRBC: 0 % (ref 0.0–0.2)

## 2023-11-24 LAB — GLUCOSE, CAPILLARY
Glucose-Capillary: 13 mg/dL — CL (ref 70–99)
Glucose-Capillary: 153 mg/dL — ABNORMAL HIGH (ref 70–99)
Glucose-Capillary: 185 mg/dL — ABNORMAL HIGH (ref 70–99)
Glucose-Capillary: 203 mg/dL — ABNORMAL HIGH (ref 70–99)
Glucose-Capillary: 239 mg/dL — ABNORMAL HIGH (ref 70–99)
Glucose-Capillary: 344 mg/dL — ABNORMAL HIGH (ref 70–99)
Glucose-Capillary: 348 mg/dL — ABNORMAL HIGH (ref 70–99)
Glucose-Capillary: 78 mg/dL (ref 70–99)

## 2023-11-24 LAB — TROPONIN I (HIGH SENSITIVITY)
Troponin I (High Sensitivity): 143 ng/L (ref <18)
Troponin I (High Sensitivity): 155 ng/L (ref <18)

## 2023-11-24 LAB — CBG MONITORING, ED
Glucose-Capillary: 126 mg/dL — ABNORMAL HIGH (ref 70–99)
Glucose-Capillary: 47 mg/dL — ABNORMAL LOW (ref 70–99)
Glucose-Capillary: 47 mg/dL — ABNORMAL LOW (ref 70–99)
Glucose-Capillary: 61 mg/dL — ABNORMAL LOW (ref 70–99)
Glucose-Capillary: 84 mg/dL (ref 70–99)
Glucose-Capillary: 85 mg/dL (ref 70–99)

## 2023-11-24 LAB — MRSA NEXT GEN BY PCR, NASAL: MRSA by PCR Next Gen: NOT DETECTED

## 2023-11-24 LAB — MAGNESIUM: Magnesium: 2.2 mg/dL (ref 1.7–2.4)

## 2023-11-24 LAB — CK: Total CK: 897 U/L — ABNORMAL HIGH (ref 38–234)

## 2023-11-24 LAB — PHOSPHORUS: Phosphorus: 3 mg/dL (ref 2.5–4.6)

## 2023-11-24 MED ORDER — CHLORHEXIDINE GLUCONATE CLOTH 2 % EX PADS
6.0000 | MEDICATED_PAD | Freq: Every day | CUTANEOUS | Status: DC
  Administered 2023-11-24: 6 via TOPICAL

## 2023-11-24 MED ORDER — ONDANSETRON HCL 4 MG PO TABS: 4.0000 mg | ORAL_TABLET | Freq: Four times a day (QID) | ORAL | Status: DC | PRN

## 2023-11-24 MED ORDER — HALOPERIDOL LACTATE 5 MG/ML IJ SOLN: 5.0000 mg | Freq: Once | INTRAMUSCULAR | Status: AC

## 2023-11-24 MED ORDER — PRAVASTATIN SODIUM 40 MG PO TABS
40.0000 mg | ORAL_TABLET | Freq: Every day | ORAL | Status: DC
  Administered 2023-11-24 – 2023-11-25 (×2): 40 mg via ORAL
  Filled 2023-11-24 (×2): qty 1

## 2023-11-24 MED ORDER — DEXTROSE 50 % IV SOLN
1.0000 | Freq: Once | INTRAVENOUS | Status: AC
  Administered 2023-11-24: 50 mL via INTRAVENOUS
  Filled 2023-11-24: qty 50

## 2023-11-24 MED ORDER — ACETAMINOPHEN 325 MG PO TABS
650.0000 mg | ORAL_TABLET | Freq: Four times a day (QID) | ORAL | Status: DC | PRN
  Administered 2023-11-24 – 2023-11-25 (×3): 650 mg via ORAL
  Filled 2023-11-24 (×3): qty 2

## 2023-11-24 MED ORDER — POTASSIUM CHLORIDE CRYS ER 20 MEQ PO TBCR
40.0000 meq | EXTENDED_RELEASE_TABLET | ORAL | Status: AC
  Administered 2023-11-24 (×2): 40 meq via ORAL
  Filled 2023-11-24 (×2): qty 2

## 2023-11-24 MED ORDER — DEXTROSE 50 % IV SOLN: 1.0000 | Freq: Once | INTRAVENOUS | Status: AC

## 2023-11-24 MED ORDER — ALBUTEROL SULFATE (2.5 MG/3ML) 0.083% IN NEBU: 2.5000 mg | INHALATION_SOLUTION | Freq: Four times a day (QID) | RESPIRATORY_TRACT | Status: DC | PRN

## 2023-11-24 MED ORDER — ACETAMINOPHEN 650 MG RE SUPP: 650.0000 mg | Freq: Four times a day (QID) | RECTAL | Status: DC | PRN

## 2023-11-24 MED ORDER — DEXTROSE 50 % IV SOLN
50.0000 mL | Freq: Once | INTRAVENOUS | Status: AC
  Administered 2023-11-24: 50 mL via INTRAVENOUS
  Filled 2023-11-24: qty 50

## 2023-11-24 MED ORDER — DEXTROSE 10 % IV SOLN: Freq: Once | INTRAVENOUS | Status: AC

## 2023-11-24 MED ORDER — MEMANTINE HCL 10 MG PO TABS
10.0000 mg | ORAL_TABLET | Freq: Two times a day (BID) | ORAL | Status: DC
  Administered 2023-11-24 – 2023-11-25 (×2): 10 mg via ORAL
  Filled 2023-11-24 (×2): qty 1

## 2023-11-24 MED ORDER — DONEPEZIL HCL 5 MG PO TABS
10.0000 mg | ORAL_TABLET | Freq: Every day | ORAL | Status: DC
  Administered 2023-11-24: 10 mg via ORAL
  Filled 2023-11-24: qty 2

## 2023-11-24 MED ORDER — GLUCAGON HCL RDNA (DIAGNOSTIC) 1 MG IJ SOLR: 1.0000 mg | Freq: Once | INTRAMUSCULAR | Status: DC | PRN

## 2023-11-24 MED ORDER — RISPERIDONE 0.5 MG PO TABS
0.5000 mg | ORAL_TABLET | Freq: Every day | ORAL | Status: DC
  Administered 2023-11-24: 0.5 mg via ORAL
  Filled 2023-11-24: qty 1

## 2023-11-24 MED ORDER — FLUTICASONE FUROATE-VILANTEROL 100-25 MCG/ACT IN AEPB
1.0000 | INHALATION_SPRAY | Freq: Every day | RESPIRATORY_TRACT | Status: DC
  Administered 2023-11-24 – 2023-11-25 (×2): 1 via RESPIRATORY_TRACT
  Filled 2023-11-24: qty 28

## 2023-11-24 MED ORDER — HALOPERIDOL LACTATE 5 MG/ML IJ SOLN
INTRAMUSCULAR | Status: AC
  Administered 2023-11-24: 5 mg via INTRAMUSCULAR
  Filled 2023-11-24: qty 1

## 2023-11-24 MED ORDER — DEXTROSE 50 % IV SOLN
25.0000 mL | Freq: Once | INTRAVENOUS | Status: AC
  Administered 2023-11-24: 25 mL via INTRAVENOUS

## 2023-11-24 MED ORDER — DEXTROSE 50 % IV SOLN
INTRAVENOUS | Status: AC
  Administered 2023-11-24: 50 mL via INTRAVENOUS
  Filled 2023-11-24: qty 50

## 2023-11-24 MED ORDER — ONDANSETRON HCL 4 MG/2ML IJ SOLN: 4.0000 mg | Freq: Four times a day (QID) | INTRAMUSCULAR | Status: DC | PRN

## 2023-11-24 MED ORDER — ENOXAPARIN SODIUM 40 MG/0.4ML IJ SOSY
40.0000 mg | PREFILLED_SYRINGE | Freq: Every day | INTRAMUSCULAR | Status: DC
  Administered 2023-11-24 – 2023-11-25 (×2): 40 mg via SUBCUTANEOUS
  Filled 2023-11-24 (×2): qty 0.4

## 2023-11-24 MED ORDER — UMECLIDINIUM BROMIDE 62.5 MCG/ACT IN AEPB
1.0000 | INHALATION_SPRAY | Freq: Every day | RESPIRATORY_TRACT | Status: DC
  Administered 2023-11-24 – 2023-11-25 (×2): 1 via RESPIRATORY_TRACT
  Filled 2023-11-24: qty 7

## 2023-11-24 NOTE — Therapy (Signed)
 11/24/23: Attempted to see pt.  Pt is very agitated; nurse requested therapist to hold treatment  Leodis Rainwater, PT CLT [phone]

## 2023-11-24 NOTE — Progress Notes (Signed)
 Pt 10 AM CBG 344.  Verbal order received to pause D10 IV Fluid and recheck CBG in one hour.  Pt 11 AM CBG 348.  MD notified. Verbal order to hold D10 and recheck CBG at 1400.
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
## 2023-11-24 NOTE — TOC CM/SW Note (Signed)
 Transition of Care [facility]) - Inpatient Brief Assessment   Patient Details  Name: Jamie [name] MRN: [id number] Date of Birth: 1951-08-09  Transition of Care [facility]) CM/SW Contact:    Grandville Lax, LCSWA Phone Number: 11/24/2023, 10:34 AM   Clinical Narrative: Transition of Care Department [facility]) has reviewed patient and no TOC needs have been identified at this time. We will continue to monitor patient advancement through interdiciplinary progression rounds. If new patient transition needs arise, please place a TOC consult.   Transition of Care Asessment: Insurance and Status: Insurance coverage has been reviewed Patient has primary care physician: Yes Home environment has been reviewed: From home Prior level of function:: Independent Prior/Current Home Services: No current home services Social Drivers of Health Review: SDOH reviewed no interventions necessary Readmission risk has been reviewed: Yes Transition of care needs: no transition of care needs at this time

## 2023-11-24 NOTE — ED Notes (Signed)
 Dr Quintella Buck verbally requested 1 amp of D50.

## 2023-11-24 NOTE — Plan of Care (Signed)
  Problem: Education: Goal: Knowledge of General Education information will improve Description: Including pain rating scale, medication(s)/side effects and non-pharmacologic comfort measures Outcome: Not Progressing   Problem: Health Behavior/Discharge Planning: Goal: Ability to manage health-related needs will improve Outcome: Not Progressing   Problem: Coping: Goal: Level of anxiety will decrease Outcome: Not Progressing   Problem: Safety: Goal: Ability to remain free from injury will improve Outcome: Not Progressing   

## 2023-11-24 NOTE — Inpatient Diabetes Management (Signed)
 Inpatient Diabetes Program Recommendations  AACE/ADA: New Consensus Statement on Inpatient Glycemic Control   Target Ranges:  Prepandial:   less than 140 mg/dL      Peak postprandial:   less than 180 mg/dL (1-2 hours)      Critically ill patients:  140 - 180 mg/dL    Latest Reference Range & Units 11/24/23 00:52 11/24/23 02:06 11/24/23 03:00 11/24/23 04:18 11/24/23 05:09 11/24/23 06:28 11/24/23 08:13 11/24/23 08:55 11/24/23 10:02  Glucose-Capillary 70 - 99 mg/dL 61 (L) 85 47 (L) 161 (H) 84 47 (L) 13 (LL) 203 (H) 344 (H)    Latest Reference Range & Units 11/23/23 19:37 11/23/23 20:13 11/23/23 21:18 11/23/23 22:39 11/23/23 23:23 11/23/23 23:58  Glucose-Capillary 70 - 99 mg/dL 096 (H) 045 (H) 94 16 (LL) 122 (H) 96   Review of Glycemic Control  Diabetes history: DM2 Outpatient Diabetes medications: Lantus  25 units QHS Current orders for Inpatient glycemic control: None; CBGs Q4H  Inpatient Diabetes Program Recommendations:    Insulin : Noted recurrent hypoglycemia. Glucose 13 mg/dl at 4:09 am today and treated. Glucose up to 344 mg/dl at 81:19 am. Once hypoglycemia completely resolved without any further treatment of hypoglycemia, please consider ordering Novolog  0-6 units Q4H.  Thanks, Beacher Limerick, RN, MSN, CDCES Diabetes Coordinator Inpatient Diabetes Program [phone] (Team Pager from 8am to 5pm)

## 2023-11-24 NOTE — Progress Notes (Signed)
 PROGRESS NOTE  Jamie [name], is a 73 y.o. female, DOB - 06/18/1951, ZOX:[id number]  Admit date - 11/23/2023   Admitting Physician Twilla Galea, DO  Outpatient Primary MD for the patient is Jamie Hem, FNP  LOS - 1  Chief Complaint  Patient presents with   Hypoglycemia      Brief Narrative:   73 y.o. female with medical history significant of hypertension, hyperlipidemia, T2DM, COPD, dementia --admitted on 11/23/2023 with recurrent falls and found to have symptomatic hypoglycemia and hypothermia  -Assessment and Plan: 1)DM2- with symptomatic hypoglycemia-----prior A1c 6.0 reflecting excellent diabetic control PTA  Blood Glucose 47 >>13 --??  Due to medication error -Denies vomiting or diarrhea -Appears to be eating well hold Lantus  insulin  -Patient received D50 infusions -Currently on D10 continuous infusion . 2)Recurrent Falls--CK 1063 -Did physical therapy eval -Hydrate  3)COPD--without acute exacerbation, continue bronchodilators  4)Dementia--with advanced cognitive and memory deficits -Continue Aricept , Namenda  -May use Risperdal  nightly Ok to use 1:1 sitter for safety  5)HLD-- c/n Pravastatin   Status is: Inpatient   Disposition: The patient is from: Home              Anticipated d/c is to: Home              Anticipated d/c date is: 1 day              Patient currently is not medically stable to d/c. Barriers: Not Clinically Stable-   Code Status :  -  Code Status: Full Code   Family Communication:   DVT Prophylaxis  :   - SCDs  enoxaparin  (LOVENOX ) injection 40 mg Start: 11/24/23 1000 SCDs Start: 11/24/23 0151  Lab Results  Component Value Date   PLT 165 11/24/2023   Inpatient Medications  Scheduled Meds:  Chlorhexidine  Gluconate Cloth  6 each Topical Q0600   donepezil   10 mg Oral QHS   enoxaparin  (LOVENOX ) injection  40 mg Subcutaneous Daily   fluticasone  furoate-vilanterol  1 puff Inhalation Daily   And   umeclidinium bromide   1 puff  Inhalation Daily   memantine   10 mg Oral BID   pravastatin   40 mg Oral Daily   risperiDONE   0.5 mg Oral QHS   Continuous Infusions: PRN Meds:.acetaminophen  **OR** acetaminophen , albuterol , glucagon  (human recombinant), ondansetron  **OR** ondansetron  (ZOFRAN ) IV   Anti-infectives (From admission, onward)    None      Subjective: [name] today has no fevers, no emesis,  No chest pain,    intermittently confused, disoriented and agitated  -Oral intake is fair Husband at bedside, questions answered  Objective: Vitals:   11/24/23 1200 11/24/23 1300 11/24/23 1515 11/24/23 1631  BP:  (!) 132/47    Pulse: 81 64    Resp: 14 20 (!) 29   Temp:    99.4 F (37.4 C)  TempSrc:    Axillary  SpO2: 97% 97%    Weight:      Height:       No intake or output data in the 24 hours ending 11/24/23 1754 Filed Weights   11/23/23 1949 11/24/23 0800  Weight: 52.2 kg 43.7 kg    Physical Exam Gen:- Awake Alert, intermittently confused, disoriented and agitated HEENT:- [place].AT, No sclera icterus Ears--HOH Neck-Supple Neck,No JVD,.  Lungs-  CTAB , fair symmetrical air movement CV- S1, S2 normal, regular  Abd-  +ve B.Sounds, Abd Soft, No tenderness,    Extremity/Skin:- No  edema, pedal pulses present  Psych--cognitive and memory deficits consistent with underlying  dementia Neuro-generalized weakness, somewhat unsteady,  no new focal deficits, no tremors  Data Reviewed: I have personally reviewed following labs and imaging studies  CBC: Recent Labs  Lab 11/23/23 2016 11/24/23 0155  WBC 10.8* 7.3  NEUTROABS 9.6*  --   HGB 12.3 11.8*  HCT 38.6 37.7  MCV 84.8 85.7  PLT 175 165   Basic Metabolic Panel: Recent Labs  Lab 11/23/23 2016 11/24/23 0155  NA 137 139  K 3.3* 3.4*  CL 104 107  CO2 23 23  GLUCOSE 136* 97  BUN 16 14  CREATININE 0.73 0.67  CALCIUM  9.0 8.7*  MG  --  2.2  PHOS  --  3.0   GFR: Estimated Creatinine Clearance: 44.5 mL/min (by C-G formula based on SCr  of 0.67 mg/dL). Liver Function Tests: Recent Labs  Lab 11/24/23 0155  AST 30  ALT 22  ALKPHOS 68  BILITOT 0.6  PROT 5.9*  ALBUMIN  3.1*   Cardiac Enzymes: Recent Labs  Lab 11/23/23 2026 11/24/23 0155  CKTOTAL 1,063* 897*    Recent Results (from the past 240 hours)  Resp panel by RT-PCR (RSV, Flu A&B, Covid) Anterior Nasal Swab     Status: None   Collection Time: 11/23/23  9:21 PM   Specimen: Anterior Nasal Swab  Result Value Ref Range Status   SARS Coronavirus 2 by RT PCR NEGATIVE NEGATIVE Final    Comment: (NOTE) SARS-CoV-2 target nucleic acids are NOT DETECTED.  The SARS-CoV-2 RNA is generally detectable in upper respiratory specimens during the acute phase of infection. The lowest concentration of SARS-CoV-2 viral copies this assay can detect is 138 copies/mL. A negative result does not preclude SARS-Cov-2 infection and should not be used as the sole basis for treatment or other patient management decisions. A negative result may occur with  improper specimen collection/handling, submission of specimen other than nasopharyngeal swab, presence of viral mutation(s) within the areas targeted by this assay, and inadequate number of viral copies(<138 copies/mL). A negative result must be combined with clinical observations, patient history, and epidemiological information. The expected result is Negative.  Fact Sheet for Patients:  BloggerCourse.com  Fact Sheet for Healthcare Providers:  SeriousBroker.it  This test is no t yet approved or cleared by the [place]  FDA and  has been authorized for detection and/or diagnosis of SARS-CoV-2 by FDA under an Emergency Use Authorization (EUA). This EUA will remain  in effect (meaning this test can be used) for the duration of the COVID-19 declaration under Section 564(b)(1) of the Act, 21 U.S.C.section 360bbb-3(b)(1), unless the authorization is terminated  or revoked  sooner.       Influenza A by PCR NEGATIVE NEGATIVE Final   Influenza B by PCR NEGATIVE NEGATIVE Final    Comment: (NOTE) The Xpert Xpress SARS-CoV-2/FLU/RSV plus assay is intended as an aid in the diagnosis of influenza from Nasopharyngeal swab specimens and should not be used as a sole basis for treatment. Nasal washings and aspirates are unacceptable for Xpert Xpress SARS-CoV-2/FLU/RSV testing.  Fact Sheet for Patients: BloggerCourse.com  Fact Sheet for Healthcare Providers: SeriousBroker.it  This test is not yet approved or cleared by the [place]  FDA and has been authorized for detection and/or diagnosis of SARS-CoV-2 by FDA under an Emergency Use Authorization (EUA). This EUA will remain in effect (meaning this test can be used) for the duration of the COVID-19 declaration under Section 564(b)(1) of the Act, 21 U.S.C. section 360bbb-3(b)(1), unless the authorization is terminated or revoked.  Resp Syncytial Virus by PCR NEGATIVE NEGATIVE Final    Comment: (NOTE) Fact Sheet for Patients: BloggerCourse.com  Fact Sheet for Healthcare Providers: SeriousBroker.it  This test is not yet approved or cleared by the [place]  FDA and has been authorized for detection and/or diagnosis of SARS-CoV-2 by FDA under an Emergency Use Authorization (EUA). This EUA will remain in effect (meaning this test can be used) for the duration of the COVID-19 declaration under Section 564(b)(1) of the Act, 21 U.S.C. section 360bbb-3(b)(1), unless the authorization is terminated or revoked.  Performed at [facility], [address] [place], [place] [address]     Radiology Studies: CT Thoracic Spine Wo Contrast Result Date: 11/23/2023 CLINICAL DATA:  Mid back pain, fell, dementia EXAM: CT THORACIC SPINE WITHOUT CONTRAST TECHNIQUE: Multidetector CT images of the thoracic were obtained  using the standard protocol without intravenous contrast. RADIATION DOSE REDUCTION: This exam was performed according to the departmental dose-optimization program which includes automated exposure control, adjustment of the mA and/or kV according to patient size and/or use of iterative reconstruction technique. COMPARISON:  03/17/2023 FINDINGS: Examination is extremely limited by patient motion throughout the study. Alignment: Alignment is grossly anatomic. Vertebrae: There are no acute or destructive bony abnormalities. Postsurgical changes are seen from thoracolumbar fusion, with intra corporal screws seen at T10, T11, and T12. Paraspinal and other soft tissues: Paraspinal soft tissues are unremarkable. Emphysema. There is bilateral bronchiectasis and mild bronchial wall thickening, with tree in bud airspace disease at the lung bases greatest on the left. Findings are consistent with chronic indolent infection such as MAC. No effusion or pneumothorax. Small hiatal hernia. Diffuse atherosclerosis of the aorta and coronary vasculature. Disc levels: There is no significant bony encroachment upon the central canal or neural foramina. Reconstructed images demonstrate no additional findings. IMPRESSION: 1. Limited study due to patient motion. 2. No acute thoracic spine fracture. 3. Postsurgical changes from thoracolumbar fusion. 4. Bronchiectasis and bibasilar tree in bud airspace disease, left greater than right, consistent with chronic indolent infection such as MAC. Electronically Signed   By: Bobbye Burrow M.D.   On: 11/23/2023 22:58   CT Lumbar Spine Wo Contrast Result Date: 11/23/2023 CLINICAL DATA:  Low back pain, trauma, fell EXAM: CT LUMBAR SPINE WITHOUT CONTRAST TECHNIQUE: Multidetector CT imaging of the lumbar spine was performed without intravenous contrast administration. Multiplanar CT image reconstructions were also generated. RADIATION DOSE REDUCTION: This exam was performed according to the  departmental dose-optimization program which includes automated exposure control, adjustment of the mA and/or kV according to patient size and/or use of iterative reconstruction technique. COMPARISON:  02/23/2019 FINDINGS: Segmentation: 5 lumbar type vertebrae. Alignment: Alignment is anatomic. Vertebrae: Posterior fusion hardware is seen spanning the thoracolumbar spine, with intra corporeal screws seen at L1, L2, L3, and L4. No acute displaced fracture. No destructive bony lesions. Paraspinal and other soft tissues: Paraspinal soft tissues are unremarkable. Extensive atherosclerosis of the aorta. Disc levels: Prior discectomies at L1-2, L2-3, and L3-4. Prominent spondylosis at L4-5 and L5-S1, with vacuum phenomenon at those levels. No bony encroachment upon the central canal or neural foramina. Reconstructed images demonstrate no additional findings. IMPRESSION: 1. Postsurgical changes from posterior thoracolumbar fusion as above. 2. No acute bony abnormalities. 3.  Aortic Atherosclerosis (ICD10-I70.0). Electronically Signed   By: Bobbye Burrow M.D.   On: 11/23/2023 22:54   CT Head Wo Contrast Result Date: 11/23/2023 CLINICAL DATA:  Head trauma, minor (Age >= 65y); Neck trauma (Age >= 65y) EXAM: CT HEAD WITHOUT CONTRAST  CT CERVICAL SPINE WITHOUT CONTRAST TECHNIQUE: Multidetector CT imaging of the head and cervical spine was performed following the standard protocol without intravenous contrast. Multiplanar CT image reconstructions of the cervical spine were also generated. RADIATION DOSE REDUCTION: This exam was performed according to the departmental dose-optimization program which includes automated exposure control, adjustment of the mA and/or kV according to patient size and/or use of iterative reconstruction technique. COMPARISON:  None Available. FINDINGS: CT HEAD FINDINGS Brain: Slightly limited evaluation due to motion artifact. Patchy and confluent areas of decreased attenuation are noted throughout  the deep and periventricular white matter of the cerebral hemispheres bilaterally, compatible with chronic microvascular ischemic disease. Age-indeterminate, likely chronic, lacunar infarction of the right basal ganglia. No evidence of large-territorial acute infarction. No parenchymal hemorrhage. No mass lesion. No extra-axial collection. No mass effect or midline shift. No hydrocephalus. Basilar cisterns are patent. Vascular: No hyperdense vessel. Atherosclerotic calcifications are present within the cavernous internal carotid arteries. Skull: No acute fracture or focal lesion. Sinuses/Orbits: Paranasal sinuses and mastoid air cells are clear. The orbits are unremarkable. Other: None. CT CERVICAL SPINE FINDINGS Alignment: Reversal normal cervical lordosis likely due to positioning and degenerative changes. Skull base and vertebrae: Multilevel moderate degenerative changes of the spine. C6-C7 anterior cervical discectomy and fusion. No associated severe osseous neural foraminal or central canal stenosis. No acute fracture. No aggressive appearing focal osseous lesion or focal pathologic process. Soft tissues and spinal canal: No prevertebral fluid or swelling. No visible canal hematoma. Upper chest: Emphysematous changes. Other: None. IMPRESSION: 1. No acute intracranial hemorrhage. 2. Age-indeterminate, likely chronic, lacunar infarction of the right basal ganglia. 3. No acute displaced fracture or traumatic listhesis of the cervical spine. 4.  Emphysema (ICD10-J43.9). Electronically Signed   By: Morgane  Naveau M.D.   On: 11/23/2023 22:49   CT Cervical Spine Wo Contrast Result Date: 11/23/2023 CLINICAL DATA:  Head trauma, minor (Age >= 65y); Neck trauma (Age >= 65y) EXAM: CT HEAD WITHOUT CONTRAST CT CERVICAL SPINE WITHOUT CONTRAST TECHNIQUE: Multidetector CT imaging of the head and cervical spine was performed following the standard protocol without intravenous contrast. Multiplanar CT image reconstructions of  the cervical spine were also generated. RADIATION DOSE REDUCTION: This exam was performed according to the departmental dose-optimization program which includes automated exposure control, adjustment of the mA and/or kV according to patient size and/or use of iterative reconstruction technique. COMPARISON:  None Available. FINDINGS: CT HEAD FINDINGS Brain: Slightly limited evaluation due to motion artifact. Patchy and confluent areas of decreased attenuation are noted throughout the deep and periventricular white matter of the cerebral hemispheres bilaterally, compatible with chronic microvascular ischemic disease. Age-indeterminate, likely chronic, lacunar infarction of the right basal ganglia. No evidence of large-territorial acute infarction. No parenchymal hemorrhage. No mass lesion. No extra-axial collection. No mass effect or midline shift. No hydrocephalus. Basilar cisterns are patent. Vascular: No hyperdense vessel. Atherosclerotic calcifications are present within the cavernous internal carotid arteries. Skull: No acute fracture or focal lesion. Sinuses/Orbits: Paranasal sinuses and mastoid air cells are clear. The orbits are unremarkable. Other: None. CT CERVICAL SPINE FINDINGS Alignment: Reversal normal cervical lordosis likely due to positioning and degenerative changes. Skull base and vertebrae: Multilevel moderate degenerative changes of the spine. C6-C7 anterior cervical discectomy and fusion. No associated severe osseous neural foraminal or central canal stenosis. No acute fracture. No aggressive appearing focal osseous lesion or focal pathologic process. Soft tissues and spinal canal: No prevertebral fluid or swelling. No visible canal hematoma. Upper chest: Emphysematous changes. Other: None.  IMPRESSION: 1. No acute intracranial hemorrhage. 2. Age-indeterminate, likely chronic, lacunar infarction of the right basal ganglia. 3. No acute displaced fracture or traumatic listhesis of the cervical  spine. 4.  Emphysema (ICD10-J43.9). Electronically Signed   By: Morgane  Naveau M.D.   On: 11/23/2023 22:49   DG Pelvis 1-2 Views Result Date: 11/23/2023 CLINICAL DATA:  fall EXAM: PELVIS - 1-2 VIEW COMPARISON:  None Available. FINDINGS: Limited evaluation due to overlapping osseous structures and overlying soft tissues. There is no evidence of pelvic fracture or diastasis. No acute displaced fracture or dislocation of the hips on frontal view. No pelvic bone lesions are seen. Lumbar surgical hardware partially visualized. Vascular calcifications. IMPRESSION: Negative for acute traumatic injury. Limited evaluation due to overlapping osseous structures and overlying soft tissues. Electronically Signed   By: Morgane  Naveau M.D.   On: 11/23/2023 22:34   DG Chest Portable 1 View Result Date: 11/23/2023 CLINICAL DATA:  fall EXAM: PORTABLE CHEST 1 VIEW COMPARISON:  Chest x-ray 03/17/2023 FINDINGS: The heart and mediastinal contours are unchanged. Atherosclerotic plaque. No focal consolidation. Chronic coarsened interstitial markings with no overt pulmonary edema. No pleural effusion. No pneumothorax. No acute osseous abnormality. Thoracolumbar surgical hardware noted. IMPRESSION: 1. No active disease. 2. Aortic Atherosclerosis (ICD10-I70.0) and Emphysema (ICD10-J43.9). Electronically Signed   By: Morgane  Naveau M.D.   On: 11/23/2023 20:57   Scheduled Meds:  Chlorhexidine  Gluconate Cloth  6 each Topical Q0600   donepezil   10 mg Oral QHS   enoxaparin  (LOVENOX ) injection  40 mg Subcutaneous Daily   fluticasone  furoate-vilanterol  1 puff Inhalation Daily   And   umeclidinium bromide   1 puff Inhalation Daily   memantine   10 mg Oral BID   pravastatin   40 mg Oral Daily   risperiDONE   0.5 mg Oral QHS   Continuous Infusions:   LOS: 1 day   Colin Dawley M.D on 11/24/2023 at 5:54 PM  Go to [email or web address] - for contact info  [facility] - Office  [phone]  If 7PM-7AM, please contact  night-coverage [email or web address] 11/24/2023, 5:54 PM

## 2023-11-24 NOTE — Plan of Care (Signed)
   Problem: Clinical Measurements: Goal: Ability to maintain clinical measurements within normal limits will improve Outcome: Progressing Goal: Diagnostic test results will improve Outcome: Progressing Goal: Respiratory complications will improve Outcome: Progressing Goal: Cardiovascular complication will be avoided Outcome: Progressing

## 2023-11-25 DIAGNOSIS — E08649 Diabetes mellitus due to underlying condition with hypoglycemia without coma: Secondary | ICD-10-CM

## 2023-11-25 DIAGNOSIS — F0280 Dementia in other diseases classified elsewhere without behavioral disturbance: Secondary | ICD-10-CM

## 2023-11-25 DIAGNOSIS — Z794 Long term (current) use of insulin: Secondary | ICD-10-CM

## 2023-11-25 DIAGNOSIS — I10 Essential (primary) hypertension: Secondary | ICD-10-CM | POA: Diagnosis not present

## 2023-11-25 DIAGNOSIS — J449 Chronic obstructive pulmonary disease, unspecified: Secondary | ICD-10-CM | POA: Diagnosis not present

## 2023-11-25 DIAGNOSIS — G300 Alzheimer's disease with early onset: Secondary | ICD-10-CM | POA: Diagnosis not present

## 2023-11-25 LAB — GLUCOSE, CAPILLARY
Glucose-Capillary: 88 mg/dL (ref 70–99)
Glucose-Capillary: 235 mg/dL — ABNORMAL HIGH (ref 70–99)
Glucose-Capillary: 298 mg/dL — ABNORMAL HIGH (ref 70–99)

## 2023-11-25 MED ORDER — DONEPEZIL HCL 10 MG PO TABS
10.0000 mg | ORAL_TABLET | Freq: Every day | ORAL | 1 refills | Status: DC
Start: 2023-11-25 — End: 2023-12-09

## 2023-11-25 MED ORDER — TRELEGY ELLIPTA 100-62.5-25 MCG/ACT IN AEPB: INHALATION_SPRAY | RESPIRATORY_TRACT | 11 refills | Status: AC

## 2023-11-25 MED ORDER — MEMANTINE HCL 10 MG PO TABS
10.0000 mg | ORAL_TABLET | Freq: Two times a day (BID) | ORAL | 1 refills | Status: DC
Start: 2023-11-25 — End: 2024-07-13

## 2023-11-25 MED ORDER — ASPIRIN EC 81 MG PO TBEC: 81.0000 mg | DELAYED_RELEASE_TABLET | Freq: Every day | ORAL | 1 refills | Status: DC

## 2023-11-25 MED ORDER — LANTUS SOLOSTAR 100 UNIT/ML ~~LOC~~ SOPN: 25.0000 [IU] | PEN_INJECTOR | Freq: Every day | SUBCUTANEOUS | 0 refills | Status: DC

## 2023-11-25 MED ORDER — RISPERIDONE 0.5 MG PO TABS: 0.5000 mg | ORAL_TABLET | Freq: Every day | ORAL | 2 refills | Status: DC

## 2023-11-25 NOTE — Discharge Summary (Signed)
 Jamie [name], is a 73 y.o. female  DOB [birth year]  MRN [id number].  Admission date:  11/23/2023  Admitting Physician  Frankey Shown, DO  Discharge Date:  11/25/2023   Primary MD  Junie Spencer, FNP  Recommendations for primary care physician for things to follow:  1) take medications as prescribed 2) follow-up with primary care physician in 1 to 2 weeks for recheck 3) reduce Lantus insulin to 25 units at bedtime from 30 units to avoid low blood sugars  Admission Diagnosis  Hypoglycemia [E16.2] Dementia with behavioral disturbance [facility]) [F03.918] Fall, initial encounter [W19.XXXA] Traumatic rhabdomyolysis, initial encounter (HCC) [T79.6XXA]   Discharge Diagnosis  Hypoglycemia [E16.2] Dementia with behavioral disturbance (HCC) [F03.918] Fall, initial encounter [W19.XXXA] Traumatic rhabdomyolysis, initial encounter (HCC) [T79.6XXA]    Active Problems:   COPD (chronic obstructive pulmonary disease) (HCC)   Mixed hyperlipidemia   Essential hypertension, benign   Diabetes mellitus due to underlying condition with hypoglycemia (HCC)   Rhabdomyolysis   Elevated troponin   Hypokalemia   Hypothermia   Alzheimer's dementia (HCC)      Past Medical History:  Diagnosis Date   Anxiety    Breast cancer (HCC)    COPD (chronic obstructive pulmonary disease) (HCC)    Deaf    Dementia (HCC)    Depression    Diabetes (HCC)    HOH (hard of hearing)    HTN (hypertension)    Hyperlipidemia    Personal history of radiation therapy    Sundowning    Vitamin D deficiency     Past Surgical History:  Procedure Laterality Date   APPLICATION OF ROBOTIC ASSISTANCE FOR SPINAL PROCEDURE N/A 02/23/2019   Procedure: APPLICATION OF ROBOTIC ASSISTANCE FOR SPINAL PROCEDURE;  Surgeon: Barnett Abu, MD;  Location: [facility] OR;  Service: Neurosurgery;  Laterality: N/A;   BACK SURGERY     BREAST SURGERY     radiation    CARPAL TUNNEL RELEASE     CERVICAL SPINE SURGERY     EYE SURGERY Bilateral    cataracts   GALLBLADDER SURGERY     INNER EAR SURGERY     POSTERIOR LUMBAR FUSION 4 LEVEL N/A 02/23/2019   Procedure: Thoracic ten to Lumbar two decompression with Posterior lumbar interbody fusion lumbar one-lumbar two with Osteotomy at Lumbar one-Lumbar two, Posterior arthrodesis Thoracic ten to Lumbar two with Mazor;  Surgeon: Barnett Abu, MD;  Location: [facility] OR;  Service: Neurosurgery;  Laterality: N/A;   SPINE SURGERY       HPI  from the history and physical done on the day of admission:    HPI: Jamie [name] is a 73 y.o. female with medical history significant of hypertension, hyperlipidemia, T2DM, COPD, dementia who presents emergency department from home via EMS due to complaints of fall, agitation and hypoglycemia.  Patient was unable to provide history, history was obtained from ED physician and ED medical record.  Per report, patient's husband (caretaker) called patient from work around 3 PM, but was unable to communicate with her, on returning home from work around  8 PM, patient was on the floor with decreased responsiveness.  EMS was activated, on arrival of EMS team, blood sugar level was noted to be at 40 and patient was a little bit cool to touch.  D50 was given with improved mental status.  Apparently, patient has been increasingly become agitated at nighttime since last 3 to 4 days, though she acts normally during the daytime.  Patient was also reported to have had increased urinary frequency and urgency and has been eating less within the last 1 week, though she continued to take her regular insulin.  There was no report of chest pain, nausea, vomiting, abdominal pain, fever.   ED Course: In the emergency department, patient was hypothermic with a temperature of 93.44F, BP was 154/74, other vital signs were within normal range.  Workup in the ED showed normal CBC except for WBC of 10.8.  BMP was normal  except for potassium of 3.3 and blood glucose of 136, troponin 41 > 69, total CK 1063, urinalysis was positive for glycosuria.  Influenza A, B, SARS, respiratory, RSV was negative. Chest x-ray showed no active disease CT thoracic spine showed no acute fracture CT lumbar spine showed no acute bony abnormalities CT head without contrast showed no acute hemorrhage CT cervical spine showed no acute displaced fracture or traumatic listhesis of the cervical spine IV morphine 4 mg x 1, Zofran x 1 was given, Zyprexa was provided.  IV hydration with 1 L NS was given.  Patient was started on D10 due to hypoglycemia.  Hospitalist was asked admit patient for further evaluation and management.   Review of Systems: Review of systems as noted in the HPI. All other systems reviewed and are negative.    Hospital Course:     Brief Narrative:   73 y.o. female with medical history significant of hypertension, hyperlipidemia, T2DM, COPD, dementia --admitted on 11/23/2023 with recurrent falls and found to have symptomatic hypoglycemia and hypothermia   -Assessment and Plan: 1)DM2- with symptomatic hypoglycemia-----prior A1c 6.0 reflecting excellent diabetic control PTA  Blood Glucose 47 >>13 --Patient received D50 infusions -Currently on D10 continuous infusion -No further hypoglycemia after discontinuation of IV dextrose -Suspect medication error with insulin prior to admission -Decrease Lantus to 25 units from 30 units at discharge to avoid further hypoglycemia . 2)Recurrent Falls--CK 1063 -Ambulating without significant concerns at this time   3)COPD--without acute exacerbation, continue bronchodilators including Trelegy   4)Dementia--with advanced cognitive and memory deficits -Continue Aricept, Namenda -Behaviors are more manageable -Cooperative at this time   5)HLD-- c/n Pravastatin  6)HTN--- continue amlodipine and losartan   Disposition: The patient is from: Home              Anticipated  d/c is to: Home  Discharge Condition: stable  Diet and Activity recommendation:  As advised  Discharge Instructions    Discharge Instructions     Call MD for:  difficulty breathing, headache or visual disturbances   Complete by: As directed    Call MD for:  persistant dizziness or light-headedness   Complete by: As directed    Call MD for:  persistant nausea and vomiting   Complete by: As directed    Call MD for:  temperature >100.4   Complete by: As directed    Diet - low sodium heart healthy   Complete by: As directed    Discharge instructions   Complete by: As directed    1) take medications as prescribed 2) follow-up with primary care physician  in 1 to 2 weeks for recheck 3) reduce Lantus insulin to 25 units at bedtime from 30 units to avoid low blood sugars   Increase activity slowly   Complete by: As directed         Discharge Medications     Allergies as of 11/25/2023       Reactions   Lipitor [atorvastatin] Other (See Comments)   Constipation    Soma [carisoprodol] Nausea And Vomiting        Medication List     TAKE these medications    Accu-Chek Aviva Plus w/Device Kit Check BS BID Dx E11.9   Accu-Chek Aviva Soln Use with glucose machine Dx E11.9   Accu-Chek Guide test strip Generic drug: glucose blood Use to check blood sugar two times daily  Dx E11.9   Accu-Chek Softclix Lancets lancets Use to check blood sugar two times daily  Dx E11.9   albuterol (2.5 MG/3ML) 0.083% nebulizer solution Commonly known as: PROVENTIL Take 3 mLs (2.5 mg total) by nebulization every 6 (six) hours as needed for wheezing or shortness of breath.   amLODipine 10 MG tablet Commonly known as: NORVASC TAKE 1 TABLET EVERY DAY   aspirin EC 81 MG tablet Take 1 tablet (81 mg total) by mouth daily with breakfast. Swallow whole. What changed: when to take this   B-D SINGLE USE SWABS REGULAR Pads Test BS BID Dx E11.9   donepezil 10 MG tablet Commonly known as:  ARICEPT Take 1 tablet (10 mg total) by mouth at bedtime.   Droplet Pen Needles 32G X 5 MM Misc Generic drug: Insulin Pen Needle Use with insulin daily Dx E11.9   Lantus SoloStar 100 UNIT/ML Solostar Pen Generic drug: insulin glargine Inject 25 Units into the skin at bedtime. What changed:  how much to take when to take this   losartan 100 MG tablet Commonly known as: COZAAR TAKE 1 TABLET EVERY DAY   memantine 10 MG tablet Commonly known as: NAMENDA Take 1 tablet (10 mg total) by mouth 2 (two) times daily.   pravastatin 40 MG tablet Commonly known as: PRAVACHOL TAKE 1 TABLET EVERY DAY   risperiDONE 0.5 MG tablet Commonly known as: RISPERDAL Take 1 tablet (0.5 mg total) by mouth at bedtime.   Trelegy Ellipta 100-62.5-25 MCG/ACT Aepb Generic drug: Fluticasone-Umeclidin-Vilant One click each am        Major procedures and Radiology Reports - PLEASE review detailed and final reports for all details, in brief -   CT Thoracic Spine Wo Contrast Result Date: 11/23/2023 CLINICAL DATA:  Mid back pain, fell, dementia EXAM: CT THORACIC SPINE WITHOUT CONTRAST TECHNIQUE: Multidetector CT images of the thoracic were obtained using the standard protocol without intravenous contrast. RADIATION DOSE REDUCTION: This exam was performed according to the departmental dose-optimization program which includes automated exposure control, adjustment of the mA and/or kV according to patient size and/or use of iterative reconstruction technique. COMPARISON:  03/17/2023 FINDINGS: Examination is extremely limited by patient motion throughout the study. Alignment: Alignment is grossly anatomic. Vertebrae: There are no acute or destructive bony abnormalities. Postsurgical changes are seen from thoracolumbar fusion, with intra corporal screws seen at T10, T11, and T12. Paraspinal and other soft tissues: Paraspinal soft tissues are unremarkable. Emphysema. There is bilateral bronchiectasis and mild bronchial  wall thickening, with tree in bud airspace disease at the lung bases greatest on the left. Findings are consistent with chronic indolent infection such as MAC. No effusion or pneumothorax. Small hiatal hernia. Diffuse atherosclerosis of  the aorta and coronary vasculature. Disc levels: There is no significant bony encroachment upon the central canal or neural foramina. Reconstructed images demonstrate no additional findings. IMPRESSION: 1. Limited study due to patient motion. 2. No acute thoracic spine fracture. 3. Postsurgical changes from thoracolumbar fusion. 4. Bronchiectasis and bibasilar tree in bud airspace disease, left greater than right, consistent with chronic indolent infection such as MAC. Electronically Signed   By: Sharlet Salina M.D.   On: 11/23/2023 22:58   CT Lumbar Spine Wo Contrast Result Date: 11/23/2023 CLINICAL DATA:  Low back pain, trauma, fell EXAM: CT LUMBAR SPINE WITHOUT CONTRAST TECHNIQUE: Multidetector CT imaging of the lumbar spine was performed without intravenous contrast administration. Multiplanar CT image reconstructions were also generated. RADIATION DOSE REDUCTION: This exam was performed according to the departmental dose-optimization program which includes automated exposure control, adjustment of the mA and/or kV according to patient size and/or use of iterative reconstruction technique. COMPARISON:  02/23/2019 FINDINGS: Segmentation: 5 lumbar type vertebrae. Alignment: Alignment is anatomic. Vertebrae: Posterior fusion hardware is seen spanning the thoracolumbar spine, with intra corporeal screws seen at L1, L2, L3, and L4. No acute displaced fracture. No destructive bony lesions. Paraspinal and other soft tissues: Paraspinal soft tissues are unremarkable. Extensive atherosclerosis of the aorta. Disc levels: Prior discectomies at L1-2, L2-3, and L3-4. Prominent spondylosis at L4-5 and L5-S1, with vacuum phenomenon at those levels. No bony encroachment upon the central canal  or neural foramina. Reconstructed images demonstrate no additional findings. IMPRESSION: 1. Postsurgical changes from posterior thoracolumbar fusion as above. 2. No acute bony abnormalities. 3.  Aortic Atherosclerosis (ICD10-I70.0). Electronically Signed   By: Sharlet Salina M.D.   On: 11/23/2023 22:54   CT Head Wo Contrast Result Date: 11/23/2023 CLINICAL DATA:  Head trauma, minor (Age >= 65y); Neck trauma (Age >= 65y) EXAM: CT HEAD WITHOUT CONTRAST CT CERVICAL SPINE WITHOUT CONTRAST TECHNIQUE: Multidetector CT imaging of the head and cervical spine was performed following the standard protocol without intravenous contrast. Multiplanar CT image reconstructions of the cervical spine were also generated. RADIATION DOSE REDUCTION: This exam was performed according to the departmental dose-optimization program which includes automated exposure control, adjustment of the mA and/or kV according to patient size and/or use of iterative reconstruction technique. COMPARISON:  None Available. FINDINGS: CT HEAD FINDINGS Brain: Slightly limited evaluation due to motion artifact. Patchy and confluent areas of decreased attenuation are noted throughout the deep and periventricular white matter of the cerebral hemispheres bilaterally, compatible with chronic microvascular ischemic disease. Age-indeterminate, likely chronic, lacunar infarction of the right basal ganglia. No evidence of large-territorial acute infarction. No parenchymal hemorrhage. No mass lesion. No extra-axial collection. No mass effect or midline shift. No hydrocephalus. Basilar cisterns are patent. Vascular: No hyperdense vessel. Atherosclerotic calcifications are present within the cavernous internal carotid arteries. Skull: No acute fracture or focal lesion. Sinuses/Orbits: Paranasal sinuses and mastoid air cells are clear. The orbits are unremarkable. Other: None. CT CERVICAL SPINE FINDINGS Alignment: Reversal normal cervical lordosis likely due to  positioning and degenerative changes. Skull base and vertebrae: Multilevel moderate degenerative changes of the spine. C6-C7 anterior cervical discectomy and fusion. No associated severe osseous neural foraminal or central canal stenosis. No acute fracture. No aggressive appearing focal osseous lesion or focal pathologic process. Soft tissues and spinal canal: No prevertebral fluid or swelling. No visible canal hematoma. Upper chest: Emphysematous changes. Other: None. IMPRESSION: 1. No acute intracranial hemorrhage. 2. Age-indeterminate, likely chronic, lacunar infarction of the right basal ganglia. 3. No acute displaced  fracture or traumatic listhesis of the cervical spine. 4.  Emphysema (ICD10-J43.9). Electronically Signed   By: Tish Frederickson M.D.   On: 11/23/2023 22:49   CT Cervical Spine Wo Contrast Result Date: 11/23/2023 CLINICAL DATA:  Head trauma, minor (Age >= 65y); Neck trauma (Age >= 65y) EXAM: CT HEAD WITHOUT CONTRAST CT CERVICAL SPINE WITHOUT CONTRAST TECHNIQUE: Multidetector CT imaging of the head and cervical spine was performed following the standard protocol without intravenous contrast. Multiplanar CT image reconstructions of the cervical spine were also generated. RADIATION DOSE REDUCTION: This exam was performed according to the departmental dose-optimization program which includes automated exposure control, adjustment of the mA and/or kV according to patient size and/or use of iterative reconstruction technique. COMPARISON:  None Available. FINDINGS: CT HEAD FINDINGS Brain: Slightly limited evaluation due to motion artifact. Patchy and confluent areas of decreased attenuation are noted throughout the deep and periventricular white matter of the cerebral hemispheres bilaterally, compatible with chronic microvascular ischemic disease. Age-indeterminate, likely chronic, lacunar infarction of the right basal ganglia. No evidence of large-territorial acute infarction. No parenchymal hemorrhage.  No mass lesion. No extra-axial collection. No mass effect or midline shift. No hydrocephalus. Basilar cisterns are patent. Vascular: No hyperdense vessel. Atherosclerotic calcifications are present within the cavernous internal carotid arteries. Skull: No acute fracture or focal lesion. Sinuses/Orbits: Paranasal sinuses and mastoid air cells are clear. The orbits are unremarkable. Other: None. CT CERVICAL SPINE FINDINGS Alignment: Reversal normal cervical lordosis likely due to positioning and degenerative changes. Skull base and vertebrae: Multilevel moderate degenerative changes of the spine. C6-C7 anterior cervical discectomy and fusion. No associated severe osseous neural foraminal or central canal stenosis. No acute fracture. No aggressive appearing focal osseous lesion or focal pathologic process. Soft tissues and spinal canal: No prevertebral fluid or swelling. No visible canal hematoma. Upper chest: Emphysematous changes. Other: None. IMPRESSION: 1. No acute intracranial hemorrhage. 2. Age-indeterminate, likely chronic, lacunar infarction of the right basal ganglia. 3. No acute displaced fracture or traumatic listhesis of the cervical spine. 4.  Emphysema (ICD10-J43.9). Electronically Signed   By: Tish Frederickson M.D.   On: 11/23/2023 22:49   DG Pelvis 1-2 Views Result Date: 11/23/2023 CLINICAL DATA:  fall EXAM: PELVIS - 1-2 VIEW COMPARISON:  None Available. FINDINGS: Limited evaluation due to overlapping osseous structures and overlying soft tissues. There is no evidence of pelvic fracture or diastasis. No acute displaced fracture or dislocation of the hips on frontal view. No pelvic bone lesions are seen. Lumbar surgical hardware partially visualized. Vascular calcifications. IMPRESSION: Negative for acute traumatic injury. Limited evaluation due to overlapping osseous structures and overlying soft tissues. Electronically Signed   By: Tish Frederickson M.D.   On: 11/23/2023 22:34   DG Chest Portable 1  View Result Date: 11/23/2023 CLINICAL DATA:  fall EXAM: PORTABLE CHEST 1 VIEW COMPARISON:  Chest x-ray 03/17/2023 FINDINGS: The heart and mediastinal contours are unchanged. Atherosclerotic plaque. No focal consolidation. Chronic coarsened interstitial markings with no overt pulmonary edema. No pleural effusion. No pneumothorax. No acute osseous abnormality. Thoracolumbar surgical hardware noted. IMPRESSION: 1. No active disease. 2. Aortic Atherosclerosis (ICD10-I70.0) and Emphysema (ICD10-J43.9). Electronically Signed   By: Tish Frederickson M.D.   On: 11/23/2023 20:57   Micro Results   Recent Results (from the past 240 hours)  Resp panel by RT-PCR (RSV, Flu A&B, Covid) Anterior Nasal Swab     Status: None   Collection Time: 11/23/23  9:21 PM   Specimen: Anterior Nasal Swab  Result Value Ref Range Status  SARS Coronavirus 2 by RT PCR NEGATIVE NEGATIVE Final    Comment: (NOTE) SARS-CoV-2 target nucleic acids are NOT DETECTED.  The SARS-CoV-2 RNA is generally detectable in upper respiratory specimens during the acute phase of infection. The lowest concentration of SARS-CoV-2 viral copies this assay can detect is 138 copies/mL. A negative result does not preclude SARS-Cov-2 infection and should not be used as the sole basis for treatment or other patient management decisions. A negative result may occur with  improper specimen collection/handling, submission of specimen other than nasopharyngeal swab, presence of viral mutation(s) within the areas targeted by this assay, and inadequate number of viral copies(<138 copies/mL). A negative result must be combined with clinical observations, patient history, and epidemiological information. The expected result is Negative.  Fact Sheet for Patients:  BloggerCourse.com  Fact Sheet for Healthcare Providers:  SeriousBroker.it  This test is no t yet approved or cleared by the [place] FDA and   has been authorized for detection and/or diagnosis of SARS-CoV-2 by FDA under an Emergency Use Authorization (EUA). This EUA will remain  in effect (meaning this test can be used) for the duration of the COVID-19 declaration under Section 564(b)(1) of the Act, 21 U.S.C.section 360bbb-3(b)(1), unless the authorization is terminated  or revoked sooner.       Influenza A by PCR NEGATIVE NEGATIVE Final   Influenza B by PCR NEGATIVE NEGATIVE Final    Comment: (NOTE) The Xpert Xpress SARS-CoV-2/FLU/RSV plus assay is intended as an aid in the diagnosis of influenza from Nasopharyngeal swab specimens and should not be used as a sole basis for treatment. Nasal washings and aspirates are unacceptable for Xpert Xpress SARS-CoV-2/FLU/RSV testing.  Fact Sheet for Patients: BloggerCourse.com  Fact Sheet for Healthcare Providers: SeriousBroker.it  This test is not yet approved or cleared by the [place] FDA and has been authorized for detection and/or diagnosis of SARS-CoV-2 by FDA under an Emergency Use Authorization (EUA). This EUA will remain in effect (meaning this test can be used) for the duration of the COVID-19 declaration under Section 564(b)(1) of the Act, 21 U.S.C. section 360bbb-3(b)(1), unless the authorization is terminated or revoked.     Resp Syncytial Virus by PCR NEGATIVE NEGATIVE Final    Comment: (NOTE) Fact Sheet for Patients: BloggerCourse.com  Fact Sheet for Healthcare Providers: SeriousBroker.it  This test is not yet approved or cleared by the [place] FDA and has been authorized for detection and/or diagnosis of SARS-CoV-2 by FDA under an Emergency Use Authorization (EUA). This EUA will remain in effect (meaning this test can be used) for the duration of the COVID-19 declaration under Section 564(b)(1) of the Act, 21 U.S.C. section 360bbb-3(b)(1),  unless the authorization is terminated or revoked.  Performed at [facility], [address] [place], [place] [address]   MRSA Next Gen by PCR, Nasal     Status: None   Collection Time: 11/24/23  8:10 AM   Specimen: Nasal Mucosa; Nasal Swab  Result Value Ref Range Status   MRSA by PCR Next Gen NOT DETECTED NOT DETECTED Final    Comment: (NOTE) The GeneXpert MRSA Assay (FDA approved for NASAL specimens only), is one component of a comprehensive MRSA colonization surveillance program. It is not intended to diagnose MRSA infection nor to guide or monitor treatment for MRSA infections. Test performance is not FDA approved in patients less than 69 years old. Performed at [facility], [address] [place], [place] [address]     Today   Subjective  Jamie [name] today has no new complaints Eating and drinking well -No further hypoglycemia          Patient has been seen and examined prior to discharge   Objective   Blood pressure (!) 135/97, pulse 93, temperature 97.8 F (36.6 C), temperature source Oral, resp. rate 16, height 5\' 4"  (1.626 m), weight 43.7 kg, SpO2 93%.   Intake/Output Summary (Last 24 hours) at 11/25/2023 1307 Last data filed at 11/25/2023 1100 Gross per 24 hour  Intake 960 ml  Output --  Net 960 ml    Exam Gen:- Awake Alert, in no acute distress, cooperative  HEENT:- [place].AT, No sclera icterus Ears--HOH Neck-Supple Neck,No JVD,.  Lungs-  CTAB , fair symmetrical air movement CV- S1, S2 normal, regular  Abd-  +ve B.Sounds, Abd Soft, No tenderness,    Extremity/Skin:- No  edema, pedal pulses present  Psych--redirectable, cooperative, baseline cognitive and memory deficits consistent with underlying dementia Neuro-generalized weakness,  no new focal deficits, no tremors   Data Review   CBC w Diff:  Lab Results  Component Value Date   WBC 7.3 11/24/2023   HGB 11.8 (L) 11/24/2023   HGB 12.0 06/10/2023   HCT 37.7 11/24/2023   HCT 37.4 06/10/2023    PLT 165 11/24/2023   PLT 228 06/10/2023   LYMPHOPCT 5 11/23/2023   MONOPCT 5 11/23/2023   EOSPCT 0 11/23/2023   BASOPCT 0 11/23/2023    CMP:  Lab Results  Component Value Date   NA 139 11/24/2023   NA 140 08/19/2023   K 3.4 (L) 11/24/2023   CL 107 11/24/2023   CO2 23 11/24/2023   BUN 14 11/24/2023   BUN 25 08/19/2023   CREATININE 0.67 11/24/2023   PROT 5.9 (L) 11/24/2023   PROT 6.2 08/19/2023   ALBUMIN 3.1 (L) 11/24/2023   ALBUMIN 3.8 08/19/2023   BILITOT 0.6 11/24/2023   BILITOT <0.2 08/19/2023   ALKPHOS 68 11/24/2023   AST 30 11/24/2023   ALT 22 11/24/2023  .  Total Discharge time is about 33 minutes  Shon Hale M.D on 11/25/2023 at 1:07 PM  Go to [email or web address] -  for contact info  [facility] - Office  [phone]

## 2023-11-25 NOTE — Evaluation (Signed)
Physical Therapy Evaluation Patient Details Name: Jamie [name] MRN: [id number] DOB: [birth year] Today's Date: 11/25/2023  History of Present Illness  PRESCILLA [name] is a 73 y.o. female with medical history significant of hypertension, hyperlipidemia, T2DM, COPD, dementia who presents emergency department from home via EMS due to complaints of fall, agitation and hypoglycemia.  Patient was unable to provide history, history was obtained from ED physician and ED medical record.  Per report, patient's husband (caretaker) called patient from work around 3 PM, but was unable to communicate with her, on returning home from work around 8 PM, patient was on the floor with decreased responsiveness.  EMS was activated, on arrival of EMS team, blood sugar level was noted to be at 40 and patient was a little bit cool to touch.  D50 was given with improved mental status.  Apparently, patient has been increasingly become agitated at nighttime since last 3 to 4 days, though she acts normally during the daytime.  Patient was also reported to have had increased urinary frequency and urgency and has been eating less within the last 1 week, though she continued to take her regular insulin.  There was no report of chest pain, nausea, vomiting, abdominal pain, fever.   Clinical Impression  Patient very HOH and requires repeated verbal/tactile cuing for following instructions.  Patient has to lean on nearby objects for support when not using an AD, safer using RW and able to ambulate in hallway, but requires repeated verbal cueing to step closer to RW with fair carryover.  Patient tolerated sitting up in chair after therapy with spouse and sitter present in room.  PLAN:  Patient to be discharged home today and discharged from acute physical therapy to care of nursing for ambulation as tolerated for length of stay with recommendations stated below          If plan is discharge home, recommend the following: A little help with  walking and/or transfers;A little help with bathing/dressing/bathroom;Help with stairs or ramp for entrance;Assistance with cooking/housework   Can travel by private vehicle        Equipment Recommendations None recommended by PT  Recommendations for Other Services       Functional Status Assessment Patient has had a recent decline in their functional status and demonstrates the ability to make significant improvements in function in a reasonable and predictable amount of time.     Precautions / Restrictions Precautions Precautions: Fall Restrictions Weight Bearing Restrictions Per Provider Order: No      Mobility  Bed Mobility Overal bed mobility: Needs Assistance Bed Mobility: Supine to Sit     Supine to sit: Contact guard, Min assist     General bed mobility comments: slow labored movement with HOB flat    Transfers Overall transfer level: Needs assistance Equipment used: Rolling walker (2 wheels) Transfers: Sit to/from Stand, Bed to chair/wheelchair/BSC Sit to Stand: Min assist   Step pivot transfers: Min assist       General transfer comment: has to lean on arm rest of chair when not using an AD, safer using RW    Ambulation/Gait Ambulation/Gait assistance: Min assist Gait Distance (Feet): 45 Feet Assistive device: Rolling walker (2 wheels) Gait Pattern/deviations: Decreased step length - left, Decreased stance time - right, Decreased stride length Gait velocity: decreased     General Gait Details: slow labored movement with tendency to push RW to far in front requiring verbal/tactile cueing for safety  Stairs  Wheelchair Mobility     Tilt Bed    Modified Rankin (Stroke Patients Only)       Balance Overall balance assessment: Needs assistance Sitting-balance support: Feet supported, No upper extremity supported Sitting balance-Leahy Scale: Fair Sitting balance - Comments: fair/good seated at EOB   Standing balance support:  During functional activity, No upper extremity supported Standing balance-Leahy Scale: Poor Standing balance comment: fair/poor using RW                             Pertinent Vitals/Pain Pain Assessment Pain Assessment: Faces Faces Pain Scale: Hurts little more Pain Location: low back Pain Descriptors / Indicators: Discomfort, Sore, Grimacing Pain Intervention(s): Limited activity within patient's tolerance, Monitored during session, Repositioned    Home Living Family/patient expects to be discharged to:: Private residence Living Arrangements: Spouse/significant other Available Help at Discharge: Family;Available 24 hours/day Type of Home: House Home Access: Stairs to enter Entrance Stairs-Rails: None Entrance Stairs-Number of Steps: 3   Home Layout: One level Home Equipment: Agricultural consultant (2 wheels)      Prior Function Prior Level of Function : Needs assist       Physical Assist : Mobility (physical);ADLs (physical) Mobility (physical): Bed mobility;Transfers;Gait;Stairs   Mobility Comments: Household ambuation using RW PRN ADLs Comments: Assisted by family     Extremity/Trunk Assessment   Upper Extremity Assessment Upper Extremity Assessment: Overall WFL for tasks assessed    Lower Extremity Assessment Lower Extremity Assessment: Generalized weakness    Cervical / Trunk Assessment Cervical / Trunk Assessment: Normal  Communication   Communication Factors Affecting Communication: Hearing impaired    Cognition Arousal: Alert Behavior During Therapy: Anxious   PT - Cognitive impairments: History of cognitive impairments                         Following commands: Impaired Following commands impaired: Follows one step commands with increased time     Cueing Cueing Techniques: Verbal cues, Tactile cues     General Comments      Exercises     Assessment/Plan    PT Assessment All further PT needs can be met in the next venue  of care  PT Problem List Decreased strength;Decreased activity tolerance;Decreased balance;Decreased mobility       PT Treatment Interventions      PT Goals (Current goals can be found in the Care Plan section)  Acute Rehab PT Goals Patient Stated Goal: return home with family to assist PT Goal Formulation: With patient/family Time For Goal Achievement: 11/25/23 Potential to Achieve Goals: Good    Frequency       Co-evaluation               AM-PAC PT "6 Clicks" Mobility  Outcome Measure Help needed turning from your back to your side while in a flat bed without using bedrails?: A Little Help needed moving from lying on your back to sitting on the side of a flat bed without using bedrails?: A Little Help needed moving to and from a bed to a chair (including a wheelchair)?: A Little Help needed standing up from a chair using your arms (e.g., wheelchair or bedside chair)?: A Little Help needed to walk in hospital room?: A Lot Help needed climbing 3-5 steps with a railing? : A Lot 6 Click Score: 16    End of Session   Activity Tolerance: Patient tolerated treatment well;Patient limited by fatigue  Patient left: in chair;with call bell/phone within reach;with nursing/sitter in room;with family/visitor present Nurse Communication: Mobility status PT Visit Diagnosis: Unsteadiness on feet (R26.81);Other abnormalities of gait and mobility (R26.89);Muscle weakness (generalized) (M62.81)    Time: 1610-9604 PT Time Calculation (min) (ACUTE ONLY): 17 min   Charges:   PT Evaluation $PT Eval Low Complexity: 1 Low PT Treatments $Therapeutic Activity: 8-22 mins PT General Charges $$ ACUTE PT VISIT: 1 Visit         1:45 PM, 11/25/23 Ocie Bob, MPT Physical Therapist with [facility] [phone] office [phone] mobile phone

## 2023-11-25 NOTE — Discharge Instructions (Signed)
1) take medications as prescribed 2) follow-up with primary care physician in 1 to 2 weeks for recheck 3) reduce Lantus insulin to 25 units at bedtime from 30 units to avoid low blood sugars

## 2023-11-25 NOTE — TOC Transition Note (Signed)
Transition of Care [facility]) - Discharge Note   Patient Details  Name: Jamie [name] MRN: [id number] Date of Birth: 03-May-1951  [facility]) CM/SW Contact:  Villa Herb, LCSWA Phone Number: 11/25/2023, 1:55 PM   Clinical Narrative:    CSW udpated that PT is recommending HH PT for pt at D/C. CSW noted that MD placed [facility] PT/RN orders. CSW reached out to pts spouse multiple times at work, cell, and home phone numbers, all attempts CSW was unable to make contact. HH referral provided to [facility] with Frances Furbish and they will follow up with pt and spouse in community. TOC signing off.   Final next level of care: Home w Home Health Services Barriers to Discharge: Barriers Resolved   Patient Goals and CMS Choice Patient states their goals for this hospitalization and ongoing recovery are:: return home CMS Medicare.gov Compare Post Acute Care list provided to:: Patient Choice offered to / list presented to : Patient      Discharge Placement                       Discharge Plan and Services Additional resources added to the After Visit Summary for                            [facility] Arranged: RN, PT [facility] Agency: [facility] Date [facility] Agency Contacted: 11/25/23   Representative spoke with at [facility] Agency: Kandee Keen  Social Drivers of Health (SDOH) Interventions SDOH Screenings   Food Insecurity: Patient Unable To Answer (11/24/2023)  Housing: Patient Unable To Answer (11/24/2023)  Transportation Needs: Patient Unable To Answer (11/24/2023)  Utilities: Patient Unable To Answer (11/24/2023)  Alcohol Screen: Low Risk  (12/30/2022)  Depression (PHQ2-9): High Risk (01/03/2023)  Financial Resource Strain: Low Risk  (12/30/2022)  Physical Activity: Insufficiently Active (12/30/2022)  Social Connections: Unknown (11/24/2023)  Stress: No Stress Concern Present (12/30/2022)  Tobacco Use: High Risk (11/23/2023)     Readmission Risk Interventions    11/24/2023   10:32 AM   Readmission Risk Prevention Plan  Transportation Screening Complete  Home Care Screening Complete  Medication Review (RN CM) Complete

## 2023-11-25 NOTE — Care Management Important Message (Signed)
Important Message  Patient Details  Name: Jamie [name] MRN: [id number] Date of Birth: 07-27-1951   Important Message Given:  N/A - LOS <3 / Initial given by admissions     Corey Harold 11/25/2023, 11:50 AM

## 2023-11-26 ENCOUNTER — Telehealth: Payer: Self-pay | Admitting: *Deleted

## 2023-11-26 NOTE — Transitions of Care (Post Inpatient/ED Visit) (Signed)
   11/26/2023  Name: BRENDA SAMANO MRN: [id number] DOB: [birth year]  Today's TOC FU Call Status: Today's TOC FU Call Status:: Unsuccessful Call (1st Attempt) Unsuccessful Call (1st Attempt) Date: 11/26/23  Attempted to reach the patient regarding the most recent Inpatient/ED visit.  Follow Up Plan: Additional outreach attempts will be made to reach the patient to complete the Transitions of Care (Post Inpatient/ED visit) call.   Irving Shows [facility], BSN RN Care Manager/ Transition of Care [place]/ [facility] [phone]

## 2023-11-27 ENCOUNTER — Encounter: Payer: Self-pay | Admitting: *Deleted

## 2023-11-27 ENCOUNTER — Telehealth: Payer: Self-pay | Admitting: *Deleted

## 2023-11-27 NOTE — Transitions of Care (Post Inpatient/ED Visit) (Signed)
11/27/2023  Name: Jamie [name] MRN: [id number] DOB: [birth year]  Today's TOC FU Call Status: Today's TOC FU Call Status:: Successful TOC FU Call Completed TOC FU Call Complete Date: 11/27/23 Patient's Name and Date of Birth confirmed.  Transition Care Management Follow-up Telephone Call Date of Discharge: 11/25/23 [facility] Type of Discharge: Inpatient Admission Primary Inpatient Discharge Diagnosis:: hypoglycemia How have you been since you were released from the hospital?: Better (pt eating, drinking well, ambulating without difficulty, pt has dementia and cousin is staying w/ pt today, unable to speak w/ spouse, he is at work- 12 hr. shift, will call back next week when spouse is off work) Any questions or concerns?: No  Items Reviewed: Did you receive and understand the discharge instructions provided?: Yes Medications obtained,verified, and reconciled?: Yes (Medications Reviewed) Any new allergies since your discharge?: No Dietary orders reviewed?: Yes Type of Diet Ordered:: carbohydrate modified Do you have support at home?: Yes People in Home:  (family members assist staying w/ pt, cousin Jamie [name] is with pt today but has limited information) Name of Support/Comfort Primary Source: Sanvi Ehler Pt/ CG gave consent for enrollment in [facility] 30 day program, calls will need to be made when patient's spouse is off work due to limited history/ information from cousin that is assisting pt during the day.   Goals Addressed             This Visit's Progress    Transition of Care/ pt will have no readmissions within 30 days       Current Barriers:  Home Health services - [facility] has not contacted pt/ family yet Knowledge Deficits related to plan of care for management of DMII and Dementia  Cognitive Deficits- dementia Spoke with patient who prefers RN Care Manager speak with her cousin Jamie [name] who is staying with pt today while  spouse is at work on a 12 hour shift (spouse cannot easily talk at work and calls will need to be scheduled on days when spouse is not working),  Dois Davenport states she does not know if pt has a nebulizer machine, states " I think she has all medicine and taking like she should", reports pt does not like to have CBG checked and refuses some of the time, does not know any CBG readings.  RNCM Clinical Goal(s):  Patient will work with the Care Management team over the next 30 days to address Transition of Care Barriers: Home Health services through collaboration with RN Care manager, provider, and care team.   Interventions: Evaluation of current treatment plan related to  self management and patient's adherence to plan as established by provider   Dementia, Diabetes Type 2 :  (Status:  New goal. and Goal on track:  Yes.)  Short Term Goal Evaluation of current treatment plan related to misuse of: Dementia Collaborated with [facility], spoke with Samuel Bouche who reports RN will see pt in the home on 12/02/23, will call pt/ family to schedule a time for this visit, Sleep hygiene recommendations and education provided, Consideration of in-home help encouraged , Discussed importance of attendance to all provider appointments, and Advised to contact provider for new or worsening symptoms Reviewed safety precautions Reviewed importance of monitoring CBG, keeping a log and correcting any hypoglycemic episodes Reviewed signs/ symptoms hypoglycemia   Patient Goals/Self-Care Activities: Participate in Transition of Care Program/Attend [facility] scheduled calls Notify RN Care Manager of TOC call rescheduling needs Take all medications as prescribed  Attend all scheduled provider appointments Call pharmacy for medication refills 3-7 days in advance of running out of medications Call provider office for new concerns or questions  [facility] will contact you to schedule a time for your 12/02/23  home visit,   [phone] RN Care Manager will speak with your spouse at next phone call Check blood sugar and keep a log Report any low blood sugar readings to your doctor Follow RULE OF 15 for low blood sugar management:  How to treat low blood sugars (Blood sugar less than 70 mg/dl  Please follow the RULE OF 15 for the treatment of hypoglycemia treatment (When your blood sugars are less than 70 mg/ dl) STEP  1:  Take 15 grams of carbohydrates when your blood sugar is low, which includes One tube of glucose gel STEP 2:  Recheck blood sugar in 15 minutes STEP 3:  3-4 glucose tabs or  3-4 oz of juice or regular soda or If your blood sugar is still low at the 15 minute recheck ---then, go back to STEP 1 and treat again with another 15 grams of carbohydrates  Follow Up Plan:  Telephone follow up appointment with care management team member scheduled for:  12/04/23 @ 1015 am           Medications Reviewed Today: Medications Reviewed Today     Reviewed by Audrie Gallus, RN (Registered Nurse) on 11/27/23 at 1434  Med List Status: <None>   Medication Order Taking? Sig Documenting Provider Last Dose Status Informant  Accu-Chek Softclix Lancets lancets [id number] Yes Use to check blood sugar two times daily  Dx E11.9 Junie Spencer, FNP Taking Active Spouse/Significant Other, Pharmacy Records  albuterol (PROVENTIL) (2.5 MG/3ML) 0.083% nebulizer solution [id number] No Take 3 mLs (2.5 mg total) by nebulization every 6 (six) hours as needed for wheezing or shortness of breath. Junie Spencer, FNP Unknown Active Spouse/Significant Other, Pharmacy Records  Alcohol Swabs (B-D SINGLE USE SWABS REGULAR) PADS [id number] Yes Test BS BID Dx E11.9 Junie Spencer, FNP Taking Active Spouse/Significant Other, Pharmacy Records  amLODipine (NORVASC) 10 MG tablet [id number] Yes TAKE 1 TABLET EVERY DAY Hawks, Christy A, FNP Taking Active Spouse/Significant Other, Pharmacy Records  aspirin EC 81 MG tablet [id number] Yes Take 1  tablet (81 mg total) by mouth daily with breakfast. Swallow whole. Shon Hale, MD Taking Active   Blood Glucose Calibration (ACCU-CHEK AVIVA) SOLN [id number] Yes Use with glucose machine Dx E11.9 Junie Spencer, FNP Taking Active Spouse/Significant Other, Pharmacy Records  Blood Glucose Monitoring Suppl (ACCU-CHEK AVIVA PLUS) w/Device KIT [id number] Yes Check BS BID Dx E11.9 Junie Spencer, FNP Taking Active Spouse/Significant Other, Pharmacy Records  donepezil (ARICEPT) 10 MG tablet [id number] Yes Take 1 tablet (10 mg total) by mouth at bedtime. Shon Hale, MD Taking Active   Fluticasone-Umeclidin-[facility] ELLIPTA) 100-62.5-25 MCG/ACT AEPB [id number] Yes One click each am Shon Hale, MD Taking Active   glucose blood (ACCU-CHEK GUIDE) test strip [id number] Yes Use to check blood sugar two times daily  Dx E11.9 Junie Spencer, FNP Taking Active Spouse/Significant Other, Pharmacy Records  insulin glargine (LANTUS SOLOSTAR) 100 UNIT/ML Solostar Pen [id number] Yes Inject 25 Units into the skin at bedtime. Shon Hale, MD Taking Active   Insulin Pen Needle (DROPLET PEN NEEDLES) 32G X 5 MM MISC [id number] Yes Use with insulin daily Dx E11.9 Junie Spencer, FNP Taking Active Spouse/Significant Other, Pharmacy Records  losartan (COZAAR) 100 MG tablet [id number] Yes TAKE 1 TABLET  EVERY DAY Junie Spencer, FNP Taking Active Spouse/Significant Other, Pharmacy Records  memantine [facility]) 10 MG tablet [id number] Yes Take 1 tablet (10 mg total) by mouth 2 (two) times daily. Shon Hale, MD Taking Active   pravastatin (PRAVACHOL) 40 MG tablet [id number] Yes TAKE 1 TABLET EVERY DAY Hawks, Christy A, FNP Taking Active Spouse/Significant Other, Pharmacy Records  risperiDONE (RISPERDAL) 0.5 MG tablet [id number] Yes Take 1 tablet (0.5 mg total) by mouth at bedtime. Shon Hale, MD Taking Active             Home Care and Equipment/Supplies: Were Home Health Services Ordered?:  Yes Name of Home Health Agency:: [facility] Has Agency set up a time to come to your home?: No EMR reviewed for Home Health Orders:  (t/[facility] to [facility], they received orders yesterday and will see pt for home visit (RN) on 12/02/23, will call and scheduled with pt/ family) Any new equipment or medical supplies ordered?: No  Functional Questionnaire: Do you need assistance with bathing/showering or dressing?: No Do you need assistance with meal preparation?: No Do you need assistance with eating?: No Do you have difficulty maintaining continence: No Do you need assistance with getting out of bed/getting out of a chair/moving?: No Do you have difficulty managing or taking your medications?: Yes (Spouse provides oversight)  Follow up appointments reviewed: PCP Follow-up appointment confirmed?: Yes Date of PCP follow-up appointment?: 12/11/23 Follow-up Provider: Jannifer Rodney NP Specialist Hospital Follow-up appointment confirmed?: No Reason Specialist Follow-Up Not Confirmed:  (pt / family states no follow up w/ specialists) Do you need transportation to your follow-up appointment?: No (spouse provides transportation) Do you understand care options if your condition(s) worsen?: Yes-patient verbalized understanding  SDOH Interventions Today    Flowsheet Row Most Recent Value  SDOH Interventions   Food Insecurity Interventions Intervention Not Indicated  Housing Interventions Intervention Not Indicated  Transportation Interventions Intervention Not Indicated  Utilities Interventions Intervention Not Indicated       Irving Shows [facility], BSN RN Care Manager/ Transition of Care [place]/ [facility] Population [facility] [phone]

## 2023-12-01 ENCOUNTER — Ambulatory Visit (INDEPENDENT_AMBULATORY_CARE_PROVIDER_SITE_OTHER): Payer: Medicare HMO | Admitting: Nurse Practitioner

## 2023-12-01 ENCOUNTER — Encounter: Payer: Self-pay | Admitting: Nurse Practitioner

## 2023-12-01 ENCOUNTER — Ambulatory Visit: Payer: Self-pay | Admitting: Family

## 2023-12-01 VITALS — BP 153/81 | HR 83 | Temp 97.4°F | Wt 94.4 lb

## 2023-12-01 DIAGNOSIS — E1159 Type 2 diabetes mellitus with other circulatory complications: Secondary | ICD-10-CM | POA: Diagnosis not present

## 2023-12-01 DIAGNOSIS — E162 Hypoglycemia, unspecified: Secondary | ICD-10-CM

## 2023-12-01 DIAGNOSIS — Z794 Long term (current) use of insulin: Secondary | ICD-10-CM

## 2023-12-01 LAB — GLUCOSE HEMOCUE WAIVED: Glu Hemocue Waived: 67 mg/dL — ABNORMAL LOW (ref 70–99)

## 2023-12-01 MED ORDER — FREESTYLE LIBRE 3 SENSOR MISC
1.0000 | 3 refills | Status: DC
Start: 2023-12-01 — End: 2023-12-01

## 2023-12-01 MED ORDER — FREESTYLE LIBRE 3 READER DEVI: 1.0000 | Freq: Every day | 0 refills | Status: DC

## 2023-12-01 MED ORDER — LANTUS SOLOSTAR 100 UNIT/ML ~~LOC~~ SOPN: 20.0000 [IU] | PEN_INJECTOR | Freq: Every day | SUBCUTANEOUS | 0 refills | Status: DC

## 2023-12-01 MED ORDER — FREESTYLE LIBRE 3 SENSOR MISC: 1.0000 | 3 refills | Status: DC

## 2023-12-01 NOTE — Progress Notes (Signed)
Subjective:    Patient ID: Jamie [name], female    DOB: 08-Sep-1951, 73 y.o.   MRN: [id number]   Chief Complaint: low blood sugars  HPI Today's visit was for Transitional Care Management.  The patient was discharged from [facility] on 11/25/23 with a primary diagnosis of hypoglycemia.   Contact with the patient and/or caregiver, by a clinical staff member, was made on 11/27/23 and was documented as a telephone encounter within the EMR.  Through chart review and discussion with the patient I have determined that management of their condition is of high complexity.    Blood sugars have been running in the 40's. Was in the hospital last week with low blood sugars. They told her to hold her lantus but she took it anyway. Blood sugars have been low every since she got out of the hospital. Was suppose to decrease her lantus to 25u but family has not done that.  Patient Active Problem List   Diagnosis Date Noted   Diabetes mellitus due to underlying condition with hypoglycemia (HCC) 11/24/2023   Rhabdomyolysis 11/24/2023   Elevated troponin 11/24/2023   Hypokalemia 11/24/2023   Hypothermia 11/24/2023   Alzheimer's dementia (HCC) 11/24/2023   Hypoglycemia 11/23/2023   Moderate early onset Alzheimer's dementia with agitation (HCC) 11/07/2022   Left wrist pain 06/29/2021   Essential hypertension, benign 08/10/2019   Postural kyphosis of lumbar region 02/23/2019   Tobacco abuse counseling 09/09/2018   Lumbar radiculopathy, chronic 02/03/2017   DM type 2 causing vascular disease (HCC)    Chronic back pain 10/03/2016   Underweight 05/02/2016   GERD (gastroesophageal reflux disease) 02/13/2015   Hard of hearing 02/10/2014   Mixed hyperlipidemia 10/13/2013   Hyperlipidemia associated with type 2 diabetes mellitus (HCC)    Vitamin D deficiency    Coronary artery calcification 08/31/2013   Lung nodule, right upper lobe 8 mm March 2014 02/04/2013   COPD (chronic obstructive pulmonary  disease) (HCC) 02/04/2013   Current smoker 02/04/2013       Review of Systems  Constitutional:  Negative for diaphoresis.  Eyes:  Negative for pain.  Respiratory:  Negative for shortness of breath.   Cardiovascular:  Negative for chest pain, palpitations and leg swelling.  Gastrointestinal:  Negative for abdominal pain.  Endocrine: Negative for polydipsia.  Skin:  Negative for rash.  Neurological:  Negative for dizziness, weakness and headaches.  Hematological:  Does not bruise/bleed easily.  Psychiatric/Behavioral:  Positive for confusion.   All other systems reviewed and are negative.      Objective:   Physical Exam Constitutional:      Appearance: Normal appearance.  Cardiovascular:     Rate and Rhythm: Normal rate and regular rhythm.     Heart sounds: Normal heart sounds.  Pulmonary:     Effort: Pulmonary effort is normal.     Breath sounds: Normal breath sounds.  Skin:    General: Skin is warm.  Neurological:     General: No focal deficit present.     Mental Status: She is alert and oriented to person, place, and time.  Psychiatric:        Mood and Affect: Mood normal.        Behavior: Behavior normal.       BP (!) 153/81   Pulse 83   Temp (!) 97.4 F (36.3 C)   Wt 94 lb 6.4 oz (42.8 kg)   SpO2 100%   BMI 16.20 kg/m   BS 67  Assessment & Plan:  Jamie [name] in today with chief complaint of Hospitalization Follow-up   1. Low blood sugar (Primary) Decrease luntus to 20u a day 3 small meals a day - Glucose Hemocue Waived  2. DM type 2 causing vascular disease (HCC) Decrease lantus to 20 daily 3 small eats a meal Ordered libre for her - insulin glargine (LANTUS SOLOSTAR) 100 UNIT/ML Solostar Pen; Inject 20 Units into the skin at bedtime.  Dispense: 30 mL; Refill: 0    The above assessment and management plan was discussed with the patient. The patient verbalized understanding of and has agreed to the management plan. Patient is aware to  call the clinic if symptoms persist or worsen. Patient is aware when to return to the clinic for a follow-up visit. Patient educated on when it is appropriate to go to the emergency department.   Mary-Margaret Daphine Deutscher, FNP

## 2023-12-01 NOTE — Telephone Encounter (Signed)
Copied from CRM [id number]. Topic: Clinical - Red Word Triage >> Dec 01, 2023  7:59 AM Carlatta H wrote: [facility] that prompted transfer to Nurse Triage: Patient was in hospital last week and husband called in and stated her blood sugar is dropping every morning//Blood sugar is 44  Chief Complaint: low blood glucose level Symptoms: kicking, screaming, bgl 44 this am Frequency: every am Pertinent Negatives: Patient denies n/v, fever, diarrhea Disposition: [] ED /[] Urgent Care (no appt availability in office) / [x] Appointment(In office/virtual)/ []  [facility]/ [] Home Care/ [] Refused Recommended Disposition /[] [facility]/ []  Follow-up with PCP Additional Notes: husband called 911 this am for bgl of 44.  After ems treatment, bgl is 300.  Apt made for this am.  Care advice given, denies questions, instructed to go to the er if becomes worse.   Reason for Disposition  [1] Blood glucose 70  mg/dL (3.9 mmol/L) or below OR symptomatic, now improved with Care Advice AND [2] cause unknown  Answer Assessment - Initial Assessment Questions 1. SYMPTOMS: "What symptoms are you concerned about?"     Blood sugar was 44 this am and husband called 911 2. ONSET:  "When did the symptoms start?"     This am and every morning.  She screams and kicks.  All started about a week ago 3. BLOOD GLUCOSE: "What is your blood glucose level?"      300 4. USUAL RANGE: "What is your blood glucose level usually?" (e.g., usual fasting morning value, usual evening value)     115 5. TYPE 1 or 2:  "Do you know what type of diabetes you have?"  (e.g., Type 1, Type 2, Gestational; doesn't know)      2 6. INSULIN: "Do you take insulin?" "What type of insulin(s) do you use? What is the mode of delivery? (syringe, pen; injection or pump) "When did you last give yourself an insulin dose?" (i.e., time or hours/minutes ago) "How much did you give?" (i.e., how many units)     yes 7. DIABETES PILLS: "Do you take any  pills for your diabetes?" If Yes, ask: "What is the name of the medicine(s) that you take for high blood sugar?"     na 8. OTHER SYMPTOMS: "Do you have any symptoms?" (e.g., fever, frequent urination, difficulty breathing, vomiting)     Frequent urination 9. LOW BLOOD GLUCOSE TREATMENT: "What have you done so far to treat the low blood glucose level?"     Gives something to drink and candy or a cookie 10. FOOD: "When did you last eat or drink?"       Not yet 11. ALONE: "Are you alone right now or is someone with you?"        no 12. PREGNANCY: "Is there any chance you are pregnant?" "When was your last menstrual period?"       na  Protocols used: Diabetes - Low Blood Sugar-A-AH

## 2023-12-01 NOTE — Addendum Note (Signed)
Addended by: Bennie Pierini on: 12/01/2023 11:32 AM   Modules accepted: Orders

## 2023-12-01 NOTE — Patient Instructions (Signed)
Hypoglycemia Hypoglycemia is when the amount of sugar, or glucose, in your blood is too low. Low blood sugar can happen if you have diabetes or if you don't have diabetes. It may be an emergency. What are the causes? Low blood sugar happens most often in people who have diabetes. It may be caused by: Diabetes medicine. Not eating enough, or not eating often enough. Being more active than normal. If you don't have diabetes, you may still get low blood sugar if: There's a tumor in your pancreas. A tumor is a growth of cells that isn't normal. You don't eat enough, or you fast. Fasting is when you don't eat for long periods at a time. You have a bad infection or illness. You have problems after weight loss surgery. You have kidney or liver problems. You take certain medicines. What increases the risk? You're more likely to have low blood sugar if: You have diabetes and take medicine for it. You drink a lot of alcohol. You get sick. What are the signs or symptoms? Mild Hunger or feeling like you may vomit. Sweating and feeling cold to the touch. Feeling dizzy or light-headed. Being sleepy or having trouble sleeping. A headache. Blurry vision. Mood changes. These include feeling worried, nervous, or easily annoyed. Moderate Feeling confused. Changes in the way you act. Weakness. An uneven heartbeat. Very bad Having very low blood sugar is an emergency. It can cause: Fainting. Seizures. A coma. Death. How is this diagnosed?  Low blood sugar can be found with a blood test. This test tells you how much sugar is in your blood. It's done while you're having symptoms. Your health care provider may also do an exam and look at your medical history. How is this treated? Treating low blood sugar If you have low blood sugar, eat or drink something with sugar in it right away. The food or drink should have 15 grams of a fast-acting carbohydrate (carb). Options include: 4 oz (120 mL) of  fruit juice. 4 oz (120 mL) of soda (not diet soda). A few pieces of hard candy. Check food labels to see how many pieces to eat. 1 Tbsp (15 mL) of sugar or honey. 4 glucose tablets. 1 tube of glucose gel. Treating low blood sugar if you have diabetes Talk with your provider about how much carb you should take. If you're alert and can swallow safely, you may follow the 15:15 rule: Take 15 grams of a fast-acting carb. Check your blood sugar 15 minutes after you take the carb. If your blood sugar is still at or below 70 mg/dL (3.9 mmol/L), take 15 grams of a carb again. If your blood sugar doesn't go above 70 mg/dL (3.9 mmol/L) after 3 tries, get help right away. After your blood sugar goes back to normal, eat a meal or a snack within 1 hour. Always keep 15 grams of a fast-acting carb with you. This could be: 4 glucose tablets. A few pieces of hard candy. 1 Tbsp (15 mL) of honey or sugar. 1 tube of glucose gel. Treating very low blood sugar If your blood sugar is less than 54 mg/dL (3 mmol/L), it's an emergency. Get help right away. If you can't eat or drink, you will need to be given glucagon. A family member or friend should learn how to check your blood sugar and give you glucagon. Ask your provider if you should keep a glucagon kit at home. You may also need to be treated in a hospital. Follow  these instructions at home: If you have diabetes: Always keep a fast-acting carb (15 grams) with you. Follow your diabetes care plan. Make sure you: Know the symptoms of low blood sugar. Check your blood sugar as often as told. Always check it before and after you exercise. Always check your blood sugar before you drive. Take your medicines as told. Eat on time. Do not skip meals. Share your diabetes care plan with: Your work or school. The people you live with. Wear an alert bracelet or carry a card that says you have diabetes. General instructions If you drink alcohol: Limit how much you  have to: 0-1 drink a day if you're female. 0-2 drinks a day if you're female. Know how much alcohol is in your drink. In the [place] one drink is one 12 oz bottle of beer (355 mL), one 5 oz glass of wine (148 mL), or one 1 oz glass of hard liquor (44 mL). Be sure to eat food when you drink alcohol. Be sure to check your blood sugar after you drink. Alcohol may lead to low blood sugar later. Where to find more information American Diabetes Association (ADA): diabetes.org Contact a health care provider if: You have low blood sugar often. You have diabetes and are having trouble keeping your blood sugar in the right range. Get help right away if: You can't get your blood sugar above 70 mg/dL (3.9 mmol/L) after 3 tries. Your blood sugar is below 54 mg/dL (3 mmol/L). You have a seizure. You faint. These symptoms may be an emergency. Call 911 right away. Do not wait to see if the symptoms will go away. Do not drive yourself to the hospital. This information is not intended to replace advice given to you by your health care provider. Make sure you discuss any questions you have with your health care provider. Document Revised: 07/03/2023 Document Reviewed: 12/18/2022 Elsevier Patient Education  2024 [employer].

## 2023-12-02 DIAGNOSIS — F02B11 Dementia in other diseases classified elsewhere, moderate, with agitation: Secondary | ICD-10-CM | POA: Diagnosis not present

## 2023-12-02 DIAGNOSIS — E1159 Type 2 diabetes mellitus with other circulatory complications: Secondary | ICD-10-CM | POA: Diagnosis not present

## 2023-12-02 DIAGNOSIS — E782 Mixed hyperlipidemia: Secondary | ICD-10-CM | POA: Diagnosis not present

## 2023-12-02 DIAGNOSIS — F02B4 Dementia in other diseases classified elsewhere, moderate, with anxiety: Secondary | ICD-10-CM | POA: Diagnosis not present

## 2023-12-02 DIAGNOSIS — F32A Depression, unspecified: Secondary | ICD-10-CM | POA: Diagnosis not present

## 2023-12-02 DIAGNOSIS — G300 Alzheimer's disease with early onset: Secondary | ICD-10-CM | POA: Diagnosis not present

## 2023-12-02 DIAGNOSIS — F02B3 Dementia in other diseases classified elsewhere, moderate, with mood disturbance: Secondary | ICD-10-CM | POA: Diagnosis not present

## 2023-12-02 DIAGNOSIS — E11649 Type 2 diabetes mellitus with hypoglycemia without coma: Secondary | ICD-10-CM | POA: Diagnosis not present

## 2023-12-02 DIAGNOSIS — I700 Atherosclerosis of aorta: Secondary | ICD-10-CM | POA: Diagnosis not present

## 2023-12-03 DIAGNOSIS — F02B3 Dementia in other diseases classified elsewhere, moderate, with mood disturbance: Secondary | ICD-10-CM | POA: Diagnosis not present

## 2023-12-03 DIAGNOSIS — F02B4 Dementia in other diseases classified elsewhere, moderate, with anxiety: Secondary | ICD-10-CM | POA: Diagnosis not present

## 2023-12-03 DIAGNOSIS — E1159 Type 2 diabetes mellitus with other circulatory complications: Secondary | ICD-10-CM | POA: Diagnosis not present

## 2023-12-03 DIAGNOSIS — I700 Atherosclerosis of aorta: Secondary | ICD-10-CM | POA: Diagnosis not present

## 2023-12-03 DIAGNOSIS — E11649 Type 2 diabetes mellitus with hypoglycemia without coma: Secondary | ICD-10-CM | POA: Diagnosis not present

## 2023-12-03 DIAGNOSIS — F32A Depression, unspecified: Secondary | ICD-10-CM | POA: Diagnosis not present

## 2023-12-03 DIAGNOSIS — F02B11 Dementia in other diseases classified elsewhere, moderate, with agitation: Secondary | ICD-10-CM | POA: Diagnosis not present

## 2023-12-03 DIAGNOSIS — E782 Mixed hyperlipidemia: Secondary | ICD-10-CM | POA: Diagnosis not present

## 2023-12-03 DIAGNOSIS — G300 Alzheimer's disease with early onset: Secondary | ICD-10-CM | POA: Diagnosis not present

## 2023-12-04 ENCOUNTER — Other Ambulatory Visit: Payer: Self-pay | Admitting: *Deleted

## 2023-12-04 ENCOUNTER — Telehealth: Payer: Self-pay

## 2023-12-04 ENCOUNTER — Telehealth: Payer: Self-pay | Admitting: *Deleted

## 2023-12-04 DIAGNOSIS — F32A Depression, unspecified: Secondary | ICD-10-CM | POA: Diagnosis not present

## 2023-12-04 DIAGNOSIS — G300 Alzheimer's disease with early onset: Secondary | ICD-10-CM | POA: Diagnosis not present

## 2023-12-04 DIAGNOSIS — E11649 Type 2 diabetes mellitus with hypoglycemia without coma: Secondary | ICD-10-CM | POA: Diagnosis not present

## 2023-12-04 DIAGNOSIS — F02B3 Dementia in other diseases classified elsewhere, moderate, with mood disturbance: Secondary | ICD-10-CM | POA: Diagnosis not present

## 2023-12-04 DIAGNOSIS — F02B11 Dementia in other diseases classified elsewhere, moderate, with agitation: Secondary | ICD-10-CM | POA: Diagnosis not present

## 2023-12-04 DIAGNOSIS — E1159 Type 2 diabetes mellitus with other circulatory complications: Secondary | ICD-10-CM | POA: Diagnosis not present

## 2023-12-04 DIAGNOSIS — F02B4 Dementia in other diseases classified elsewhere, moderate, with anxiety: Secondary | ICD-10-CM | POA: Diagnosis not present

## 2023-12-04 DIAGNOSIS — E782 Mixed hyperlipidemia: Secondary | ICD-10-CM | POA: Diagnosis not present

## 2023-12-04 DIAGNOSIS — I700 Atherosclerosis of aorta: Secondary | ICD-10-CM | POA: Diagnosis not present

## 2023-12-04 NOTE — Patient Outreach (Signed)
Care Management  Transitions of Care Program Transitions of Care Post-discharge week 2   12/04/2023 Name: Jamie [name] MRN: [id number] DOB: 04-12-1951  Subjective: Jamie [name] is a 73 y.o. year old female who is a primary care patient of Junie Spencer, FNP. The Care Management team Engaged with patient Engaged with patient by telephone to assess and address transitions of care needs.   Consent to Services:  Patient was given information about care management services, agreed to services, and gave verbal consent to participate.   Assessment: Spoke with spouse Jamie [name] who reports [employer] is working well, pt has had no further hypoglycemic episodes since insulin was decreased, home health continues working with pt, reports pt has all medications and taking as prescribed.         SDOH Interventions    Flowsheet Row Telephone from 11/27/2023 in [place] [facility] Office Visit from 01/03/2023 in [facility] Clinical Support from 12/30/2022 in [facility] Office Visit from 05/02/2022 in [facility] Clinical Support from 12/12/2021 in [facility] Office Visit from 04/11/2021 in [facility]  SDOH Interventions        Food Insecurity Interventions Intervention Not Indicated -- Intervention Not Indicated -- Intervention Not Indicated --  Housing Interventions Intervention Not Indicated -- Intervention Not Indicated -- Intervention Not Indicated --  Transportation Interventions Intervention Not Indicated -- Intervention Not Indicated -- Intervention Not Indicated --  Utilities Interventions Intervention Not Indicated -- Intervention Not Indicated -- -- --  Alcohol Usage Interventions -- -- Intervention Not Indicated (Score <7) -- -- --  Depression Interventions/Treatment  -- Currently on Treatment --  Currently on Treatment -- Currently on Treatment  Financial Strain Interventions -- -- Intervention Not Indicated -- Intervention Not Indicated --  Physical Activity Interventions -- -- Intervention Not Indicated -- Intervention Not Indicated, Patient Refused, Other (Comments)  [kyphosis - limited - declines therapy referral] --  Stress Interventions -- -- Intervention Not Indicated -- Other (Comment), Patient Refused  [will speak with Christy about new back specialist - stressed because she can't do what she used to] --  Social Connections Interventions -- -- Intervention Not Indicated -- Intervention Not Indicated --        Goals Addressed             This Visit's Progress    Transition of Care/ pt will have no readmissions within 30 days       Current Barriers:  Home Health services - [facility] has not contacted pt/ family yet Knowledge Deficits related to plan of care for management of DMII and Dementia  Cognitive Deficits- dementia Spoke with patient who prefers RN Care Manager speak with her cousin Jamie [name] who is staying with pt today while spouse is at work on a 12 hour shift (spouse cannot easily talk at work and calls will need to be scheduled on days when spouse is not working),  Jamie [name] states she does not know if pt has a nebulizer machine, states " I think she has all medicine and taking like she should", reports pt does not like to have CBG checked and refuses some of the time, does not know any CBG readings. 12/04/23- update, spoke with spouse Jamie [name] who states pt saw primary care provider on 2/17, now has [employer] 3 and this is working well, insulin was decreased and fasting  CBG over past few days 150-200, no hypoglycemic episodes reported, pt is eating well, drinking adequate fluids, spouse has to redirect pt at times due to dementia, spouse will be retiring end of March 2025 and can provide more oversight with patient's care, now has family members stay  with pt while he works.  RNCM Clinical Goal(s):  Patient will work with the Care Management team over the next 30 days to address Transition of Care Barriers: Home Health services through collaboration with RN Care manager, provider, and care team.   Interventions: Evaluation of current treatment plan related to  self management and patient's adherence to plan as established by provider   Dementia, Diabetes Type 2 :  (Status:  New goal. and Goal on track:  Yes.)  Short Term Goal Evaluation of current treatment plan related to misuse of: Dementia Sleep hygiene recommendations and education provided, Consideration of in-home help encouraged , Discussed importance of attendance to all provider appointments, and Advised to contact provider for new or worsening symptoms Reviewed importance of continuing to work with home health and complete prescribed exercises Reinforced safety precautions Reviewed importance of monitoring CBG, keeping a log and correcting any hypoglycemic episodes Reinforced signs/ symptoms hypoglycemia   Patient Goals/Self-Care Activities: Participate in Transition of Care Program/Attend TOC scheduled calls Notify RN Care Manager of TOC call rescheduling needs Take all medications as prescribed Attend all scheduled provider appointments Call pharmacy for medication refills 3-7 days in advance of running out of medications Call provider office for new concerns or questions  Continue working with [facility] [phone]- complete exercises prescribed by physical therapist RN Care Manager will speak with your spouse at next phone call Report any low blood sugar readings to your doctor Follow RULE OF 15 for low blood sugar management:  How to treat low blood sugars (Blood sugar less than 70 mg/dl  Please follow the RULE OF 15 for the treatment of hypoglycemia treatment (When your blood sugars are less than 70 mg/ dl) STEP  1:  Take 15 grams of carbohydrates when  your blood sugar is low, which includes One tube of glucose gel STEP 2:  Recheck blood sugar in 15 minutes STEP 3:  3-4 glucose tabs or  3-4 oz of juice or regular soda or If your blood sugar is still low at the 15 minute recheck ---then, go back to STEP 1 and treat again with another 15 grams of carbohydrates  Follow Up Plan:  Telephone follow up appointment with care management team member scheduled for:  12/12/23 @ 1015 am          Plan: Telephone follow up appointment with care management team member scheduled for: 12/12/23 @ 1015 am  Irving Shows [facility], BSN RN Care Manager/ Transition of Care [place]/ [facility] [phone]

## 2023-12-04 NOTE — Telephone Encounter (Signed)
Copied from CRM [id number]. Topic: General - Other >> Dec 03, 2023  2:39 PM Gery Pray wrote: Reason for CRM: Cephus Shelling from occupational therapy wanted to relay the a message that it was just a consultation on 02/19. Misty Stanley stated that she completed her evaluation and she doe not recommend further OT services. For additional information please call Misty Stanley at [phone]

## 2023-12-04 NOTE — Patient Outreach (Signed)
  Care Management  Transitions of Care Program Transitions of Care Post-discharge week 2  12/04/2023 Name: Jamie [name] MRN: [id number] DOB: 16-Nov-1950  Subjective: Jamie [name] is a 73 y.o. year old female who is a primary care patient of Junie Spencer, FNP. The Care Management team was unable to reach the patient by phone to assess and address transitions of care needs.   Plan: Additional outreach attempts will be made to reach the patient enrolled in the [facility] Program (Post Inpatient/ED Visit).  Irving Shows [facility], BSN RN Care Manager/ Transition of Care [place]/ [facility] [phone]

## 2023-12-04 NOTE — Patient Instructions (Addendum)
 Our records indicate that you are due for your screening mammogram.  Please call the imaging center that does your yearly mammograms to make an appointment for a mammogram at your earliest convenience. Our office also has a mobile unit through the [facility] that comes to our location. Please call our office if you would like to make an appointment. Hypoglycemia Hypoglycemia is when the amount of sugar, or glucose, in your blood is too low. Low blood sugar can happen if you have diabetes or if you don't have diabetes. It may be an emergency. What are the causes? Low blood sugar happens most often in people who have diabetes. It may be caused by: Diabetes medicine. Not eating enough, or not eating often enough. Being more active than normal. If you don't have diabetes, you may still get low blood sugar if: There's a tumor in your pancreas. A tumor is a growth of cells that isn't normal. You don't eat enough, or you fast. Fasting is when you don't eat for long periods at a time. You have a bad infection or illness. You have problems after weight loss surgery. You have kidney or liver problems. You take certain medicines. What increases the risk? You're more likely to have low blood sugar if: You have diabetes and take medicine for it. You drink a lot of alcohol. You get sick. What are the signs or symptoms? Mild Hunger or feeling like you may vomit. Sweating and feeling cold to the touch. Feeling dizzy or light-headed. Being sleepy or having trouble sleeping. A headache. Blurry vision. Mood changes. These include feeling worried, nervous, or easily annoyed. Moderate Feeling confused. Changes in the way you act. Weakness. An uneven heartbeat. Very bad Having very low blood sugar is an emergency. It can cause: Fainting. Seizures. A coma. Death. How is this diagnosed?  Low blood sugar can be found with a blood test. This test tells you how much sugar is  in your blood. It's done while you're having symptoms. Your health care provider may also do an exam and look at your medical history. How is this treated? Treating low blood sugar If you have low blood sugar, eat or drink something with sugar in it right away. The food or drink should have 15 grams of a fast-acting carbohydrate (carb). Options include: 4 oz (120 mL) of fruit juice. 4 oz (120 mL) of soda (not diet soda). A few pieces of hard candy. Check food labels to see how many pieces to eat. 1 Tbsp (15 mL) of sugar or honey. 4 glucose tablets. 1 tube of glucose gel. Treating low blood sugar if you have diabetes Talk with your provider about how much carb you should take. If you're alert and can swallow safely, you may follow the 15:15 rule: Take 15 grams of a fast-acting carb. Check your blood sugar 15 minutes after you take the carb. If your blood sugar is still at or below 70 mg/dL (3.9 mmol/L), take 15 grams of a carb again. If your blood sugar doesn't go above 70 mg/dL (3.9 mmol/L) after 3 tries, get help right away. After your blood sugar goes back to normal, eat a meal or a snack within 1 hour. Always keep 15 grams of a fast-acting carb with you. This could be: 4 glucose tablets. A few pieces of hard candy. 1 Tbsp (15 mL) of honey or sugar. 1 tube of glucose gel. Treating very low blood sugar If your blood sugar is less than  54 mg/dL (3 mmol/L), it's an emergency. Get help right away. If you can't eat or drink, you will need to be given glucagon. A family member or friend should learn how to check your blood sugar and give you glucagon. Ask your provider if you should keep a glucagon kit at home. You may also need to be treated in a hospital. Follow these instructions at home: If you have diabetes: Always keep a fast-acting carb (15 grams) with you. Follow your diabetes care plan. Make sure you: Know the symptoms of low blood sugar. Check your blood sugar as often as told.  Always check it before and after you exercise. Always check your blood sugar before you drive. Take your medicines as told. Eat on time. Do not skip meals. Share your diabetes care plan with: Your work or school. The people you live with. Wear an alert bracelet or carry a card that says you have diabetes. General instructions If you drink alcohol: Limit how much you have to: 0-1 drink a day if you're female. 0-2 drinks a day if you're female. Know how much alcohol is in your drink. In the [place] one drink is one 12 oz bottle of beer (355 mL), one 5 oz glass of wine (148 mL), or one 1 oz glass of hard liquor (44 mL). Be sure to eat food when you drink alcohol. Be sure to check your blood sugar after you drink. Alcohol may lead to low blood sugar later. Where to find more information American Diabetes Association (ADA): diabetes.org Contact a health care provider if: You have low blood sugar often. You have diabetes and are having trouble keeping your blood sugar in the right range. Get help right away if: You can't get your blood sugar above 70 mg/dL (3.9 mmol/L) after 3 tries. Your blood sugar is below 54 mg/dL (3 mmol/L). You have a seizure. You faint. These symptoms may be an emergency. Call 911 right away. Do not wait to see if the symptoms will go away. Do not drive yourself to the hospital. This information is not intended to replace advice given to you by your health care provider. Make sure you discuss any questions you have with your health care provider. Document Revised: 07/03/2023 Document Reviewed: 12/18/2022 Elsevier Patient Education  2024 [employer].

## 2023-12-04 NOTE — Patient Instructions (Signed)
Visit Information  Thank you for taking time to visit with me today. Please don't hesitate to contact me if I can be of assistance to you before our next scheduled telephone appointment.  Following are the goals we discussed today:   Goals Addressed             This Visit's Progress    Transition of Care/ pt will have no readmissions within 30 days       Current Barriers:  Home Health services - [facility] has not contacted pt/ family yet Knowledge Deficits related to plan of care for management of DMII and Dementia  Cognitive Deficits- dementia Spoke with patient who prefers RN Care Manager speak with her cousin [name] who is staying with pt today while spouse is at work on a 12 hour shift (spouse cannot easily talk at work and calls will need to be scheduled on days when spouse is not working),  Dois Davenport states she does not know if pt has a nebulizer machine, states " I think she has all medicine and taking like she should", reports pt does not like to have CBG checked and refuses some of the time, does not know any CBG readings. 12/04/23- update, spoke with spouse Dorene Sorrow who states pt saw primary care provider on 2/17, now has [employer] 3 and this is working well, insulin was decreased and fasting CBG over past few days 150-200, no hypoglycemic episodes reported, pt is eating well, drinking adequate fluids, spouse has to redirect pt at times due to dementia, spouse will be retiring end of March 2025 and can provide more oversight with patient's care, now has family members stay with pt while he works.  RNCM Clinical Goal(s):  Patient will work with the Care Management team over the next 30 days to address Transition of Care Barriers: Home Health services through collaboration with RN Care manager, provider, and care team.   Interventions: Evaluation of current treatment plan related to  self management and patient's adherence to plan as established by  provider   Dementia, Diabetes Type 2 :  (Status:  New goal. and Goal on track:  Yes.)  Short Term Goal Evaluation of current treatment plan related to misuse of: Dementia Sleep hygiene recommendations and education provided, Consideration of in-home help encouraged , Discussed importance of attendance to all provider appointments, and Advised to contact provider for new or worsening symptoms Reviewed importance of continuing to work with home health and complete prescribed exercises Reinforced safety precautions Reviewed importance of monitoring CBG, keeping a log and correcting any hypoglycemic episodes Reinforced signs/ symptoms hypoglycemia   Patient Goals/Self-Care Activities: Participate in Transition of Care Program/Attend TOC scheduled calls Notify RN Care Manager of TOC call rescheduling needs Take all medications as prescribed Attend all scheduled provider appointments Call pharmacy for medication refills 3-7 days in advance of running out of medications Call provider office for new concerns or questions  Continue working with [facility] [phone]- complete exercises prescribed by physical therapist RN Care Manager will speak with your spouse at next phone call Report any low blood sugar readings to your doctor Follow RULE OF 15 for low blood sugar management:  How to treat low blood sugars (Blood sugar less than 70 mg/dl  Please follow the RULE OF 15 for the treatment of hypoglycemia treatment (When your blood sugars are less than 70 mg/ dl) STEP  1:  Take 15 grams of carbohydrates when your blood sugar is low, which  includes One tube of glucose gel STEP 2:  Recheck blood sugar in 15 minutes STEP 3:  3-4 glucose tabs or  3-4 oz of juice or regular soda or If your blood sugar is still low at the 15 minute recheck ---then, go back to STEP 1 and treat again with another 15 grams of carbohydrates  Follow Up Plan:  Telephone follow up appointment with care management  team member scheduled for:  12/12/23 @ 1015 am           Our next appointment is by telephone on 12/12/23 at 1015 am  Please call the care guide team at [phone] if you need to cancel or reschedule your appointment.   If you are experiencing a Mental Health or Behavioral Health Crisis or need someone to talk to, please call the Suicide and Crisis Lifeline: 988 call the [place] National Suicide Prevention Lifeline: 620-462-8225 or TTY: 929 414 0479 TTY 709 786 1785) to talk to a trained counselor call 1-800-273-TALK (toll free, 24 hour hotline) go to [facility] [address] [place] [phone]) call the [facility] ([facility]) Line: [phone] call 911   The patient verbalized understanding of instructions, educational materials, and care plan provided today and DECLINED offer to receive copy of patient instructions, educational materials, and care plan.   Telephone follow up appointment with care management team member scheduled for: 12/12/23 @ 1015 am  Irving Shows [facility], BSN RN Care Manager/ Transition of Care [place]/ [facility] [phone]

## 2023-12-08 ENCOUNTER — Ambulatory Visit (INDEPENDENT_AMBULATORY_CARE_PROVIDER_SITE_OTHER): Payer: Medicare HMO

## 2023-12-08 DIAGNOSIS — I2510 Atherosclerotic heart disease of native coronary artery without angina pectoris: Secondary | ICD-10-CM

## 2023-12-08 DIAGNOSIS — E782 Mixed hyperlipidemia: Secondary | ICD-10-CM

## 2023-12-08 DIAGNOSIS — F32A Depression, unspecified: Secondary | ICD-10-CM

## 2023-12-08 DIAGNOSIS — G300 Alzheimer's disease with early onset: Secondary | ICD-10-CM | POA: Diagnosis not present

## 2023-12-08 DIAGNOSIS — I10 Essential (primary) hypertension: Secondary | ICD-10-CM

## 2023-12-08 DIAGNOSIS — F02B3 Dementia in other diseases classified elsewhere, moderate, with mood disturbance: Secondary | ICD-10-CM

## 2023-12-08 DIAGNOSIS — J439 Emphysema, unspecified: Secondary | ICD-10-CM

## 2023-12-08 DIAGNOSIS — E1159 Type 2 diabetes mellitus with other circulatory complications: Secondary | ICD-10-CM | POA: Diagnosis not present

## 2023-12-08 DIAGNOSIS — F02B11 Dementia in other diseases classified elsewhere, moderate, with agitation: Secondary | ICD-10-CM | POA: Diagnosis not present

## 2023-12-08 DIAGNOSIS — F02B4 Dementia in other diseases classified elsewhere, moderate, with anxiety: Secondary | ICD-10-CM | POA: Diagnosis not present

## 2023-12-08 DIAGNOSIS — E11649 Type 2 diabetes mellitus with hypoglycemia without coma: Secondary | ICD-10-CM

## 2023-12-08 DIAGNOSIS — I700 Atherosclerosis of aorta: Secondary | ICD-10-CM | POA: Diagnosis not present

## 2023-12-09 ENCOUNTER — Encounter: Payer: Self-pay | Admitting: Family

## 2023-12-09 ENCOUNTER — Ambulatory Visit (INDEPENDENT_AMBULATORY_CARE_PROVIDER_SITE_OTHER): Payer: Medicare HMO | Admitting: Family

## 2023-12-09 VITALS — BP 136/80 | HR 85 | Temp 97.5°F | Ht 64.0 in | Wt 92.0 lb

## 2023-12-09 DIAGNOSIS — F17200 Nicotine dependence, unspecified, uncomplicated: Secondary | ICD-10-CM | POA: Diagnosis not present

## 2023-12-09 DIAGNOSIS — I700 Atherosclerosis of aorta: Secondary | ICD-10-CM | POA: Diagnosis not present

## 2023-12-09 DIAGNOSIS — E1159 Type 2 diabetes mellitus with other circulatory complications: Secondary | ICD-10-CM

## 2023-12-09 DIAGNOSIS — F32A Depression, unspecified: Secondary | ICD-10-CM | POA: Diagnosis not present

## 2023-12-09 DIAGNOSIS — E782 Mixed hyperlipidemia: Secondary | ICD-10-CM | POA: Diagnosis not present

## 2023-12-09 DIAGNOSIS — M5441 Lumbago with sciatica, right side: Secondary | ICD-10-CM

## 2023-12-09 DIAGNOSIS — K219 Gastro-esophageal reflux disease without esophagitis: Secondary | ICD-10-CM

## 2023-12-09 DIAGNOSIS — G300 Alzheimer's disease with early onset: Secondary | ICD-10-CM

## 2023-12-09 DIAGNOSIS — E08649 Diabetes mellitus due to underlying condition with hypoglycemia without coma: Secondary | ICD-10-CM

## 2023-12-09 DIAGNOSIS — H9190 Unspecified hearing loss, unspecified ear: Secondary | ICD-10-CM | POA: Diagnosis not present

## 2023-12-09 DIAGNOSIS — E1169 Type 2 diabetes mellitus with other specified complication: Secondary | ICD-10-CM | POA: Diagnosis not present

## 2023-12-09 DIAGNOSIS — I10 Essential (primary) hypertension: Secondary | ICD-10-CM

## 2023-12-09 DIAGNOSIS — J410 Simple chronic bronchitis: Secondary | ICD-10-CM | POA: Diagnosis not present

## 2023-12-09 DIAGNOSIS — E11649 Type 2 diabetes mellitus with hypoglycemia without coma: Secondary | ICD-10-CM | POA: Diagnosis not present

## 2023-12-09 DIAGNOSIS — R636 Underweight: Secondary | ICD-10-CM

## 2023-12-09 DIAGNOSIS — Z09 Encounter for follow-up examination after completed treatment for conditions other than malignant neoplasm: Secondary | ICD-10-CM

## 2023-12-09 DIAGNOSIS — F02B4 Dementia in other diseases classified elsewhere, moderate, with anxiety: Secondary | ICD-10-CM | POA: Diagnosis not present

## 2023-12-09 DIAGNOSIS — Z716 Tobacco abuse counseling: Secondary | ICD-10-CM

## 2023-12-09 DIAGNOSIS — E559 Vitamin D deficiency, unspecified: Secondary | ICD-10-CM

## 2023-12-09 DIAGNOSIS — F02B11 Dementia in other diseases classified elsewhere, moderate, with agitation: Secondary | ICD-10-CM

## 2023-12-09 DIAGNOSIS — G8929 Other chronic pain: Secondary | ICD-10-CM

## 2023-12-09 DIAGNOSIS — Z794 Long term (current) use of insulin: Secondary | ICD-10-CM

## 2023-12-09 DIAGNOSIS — F02B3 Dementia in other diseases classified elsewhere, moderate, with mood disturbance: Secondary | ICD-10-CM | POA: Diagnosis not present

## 2023-12-09 MED ORDER — LANTUS SOLOSTAR 100 UNIT/ML ~~LOC~~ SOPN
15.0000 [IU] | PEN_INJECTOR | Freq: Every day | SUBCUTANEOUS | 1 refills | Status: DC
Start: 2023-12-09 — End: 2024-01-06

## 2023-12-09 MED ORDER — DONEPEZIL HCL 10 MG PO TABS: 10.0000 mg | ORAL_TABLET | Freq: Every day | ORAL | 1 refills | Status: DC

## 2023-12-09 NOTE — Progress Notes (Signed)
 Subjective:    Patient ID: Jamie [name], female    DOB: 12/28/1950, 73 y.o.   MRN: [id number]  Chief Complaint  Patient presents with   Medical Management of Chronic Issues    CAN NOT GET SUGAR UNDER CONTROL    PT presents to the office today for chronic follow up.  PT had surgical decompression and stabilization from L2-L4 on 02/05/17. Pt states her back continues to hurt and her pain is a 8 out 10 when she walks or standing.    She has CAD and takes statin daily.   She has COPD and has SOB and wheezing. She continues to smoke less than a pack a day. Uses Trelegy daily.    Husband reports memory changes. Taking Aricept 10 mg and Namenda 10 mg.   She was hospitalized 11/23/23-11/25/23 after a fall and hypoglycemia.  Back Pain This is a chronic problem. The current episode started more than 1 year ago. The problem occurs intermittently. The pain is present in the lumbar spine. The pain is at a severity of 9/10. The pain is moderate. Associated symptoms include leg pain. She has tried analgesics for the symptoms.  Diabetes She presents for her follow-up diabetic visit. She has type 2 diabetes mellitus. Hypoglycemia symptoms include confusion. Pertinent negatives for diabetes include no blurred vision and no foot paresthesias. Risk factors for coronary artery disease include dyslipidemia, diabetes mellitus, hypertension and sedentary lifestyle. She is following a generally healthy diet. Her overall blood glucose range is 90-110 mg/dl.  Hypertension This is a chronic problem. The current episode started more than 1 year ago. The problem has been resolved since onset. The problem is controlled. Associated symptoms include shortness of breath. Pertinent negatives include no blurred vision or peripheral edema. Risk factors for coronary artery disease include dyslipidemia, diabetes mellitus, obesity and sedentary lifestyle. The current treatment provides moderate improvement.      Review of  Systems  Eyes:  Negative for blurred vision.  Respiratory:  Positive for shortness of breath.   Psychiatric/Behavioral:  Positive for confusion.   All other systems reviewed and are negative.      Objective:   Physical Exam Vitals reviewed.  Constitutional:      General: She is not in acute distress.    Appearance: She is well-developed.  HENT:     Head: Normocephalic and atraumatic.     Right Ear: Tympanic membrane normal.     Left Ear: Tympanic membrane normal.     Ears:     Comments: HOH  Eyes:     Pupils: Pupils are equal, round, and reactive to light.  Neck:     Thyroid: No thyromegaly.  Cardiovascular:     Rate and Rhythm: Regular rhythm.     Heart sounds: Normal heart sounds. No murmur heard. Pulmonary:     Effort: Pulmonary effort is normal. No respiratory distress.     Breath sounds: Wheezing present.  Abdominal:     General: Bowel sounds are normal. There is no distension.     Palpations: Abdomen is soft.     Tenderness: There is no abdominal tenderness.  Musculoskeletal:        General: Tenderness present.     Cervical back: Normal range of motion and neck supple.     Comments: Pain in lumbar, walking at 80 degrees   Skin:    General: Skin is warm and dry.  Neurological:     Mental Status: She is alert and oriented to person,  place, and time.     Cranial Nerves: No cranial nerve deficit.     Deep Tendon Reflexes: Reflexes are normal and symmetric.  Psychiatric:        Behavior: Behavior normal.        Thought Content: Thought content normal.        Judgment: Judgment normal.      BP 136/80   Pulse 85   Temp (!) 97.5 F (36.4 C) (Temporal)   Ht 5\' 4"  (1.626 m)   Wt 92 lb (41.7 kg)   SpO2 95%   BMI 15.79 kg/m      Assessment & Plan:   Jamie [name] comes in today with chief complaint of Medical Management of Chronic Issues (CAN NOT GET SUGAR UNDER CONTROL )   Diagnosis and orders addressed:  1. Moderate early onset Alzheimer's dementia  with agitation (HCC) - donepezil (ARICEPT) 10 MG tablet; Take 1 tablet (10 mg total) by mouth at bedtime.  Dispense: 90 tablet; Refill: 1 - CMP14+EGFR  2. Moderate Alzheimer's dementia of other onset with agitation (HCC) - CMP14+EGFR  3. Chronic bilateral low back pain with right-sided sciatica - CMP14+EGFR  4. Simple chronic bronchitis (HCC) - CMP14+EGFR  5. Current smoker - CMP14+EGFR  6. Diabetes mellitus due to underlying condition with hypoglycemia without coma, with long-term current use of insulin (HCC) (Primary) - Bayer DCA Hb A1c Waived - CMP14+EGFR - insulin glargine (LANTUS SOLOSTAR) 100 UNIT/ML Solostar Pen; Inject 15 Units into the skin at bedtime.  Dispense: 30 mL; Refill: 1  7. Essential hypertension, benign - CMP14+EGFR  8. Gastroesophageal reflux disease without esophagitis - CMP14+EGFR  9. Hard of hearing - CMP14+EGFR  10. Hyperlipidemia associated with type 2 diabetes mellitus (HCC) - CMP14+EGFR  11. Tobacco abuse counseling  - CMP14+EGFR  12. Vitamin D deficiency - CMP14+EGFR  13. Underweight - CMP14+EGFR  14. DM type 2 causing vascular disease (HCC) - insulin glargine (LANTUS SOLOSTAR) 100 UNIT/ML Solostar Pen; Inject 15 Units into the skin at bedtime.  Dispense: 30 mL; Refill: 1  15. Hospital discharge follow-up Hospital notes reviewed   Labs pending Will decrease Lantus to 15 units from 20 units given hypoglycemia Continue current medications  Health Maintenance reviewed Diet and exercise encouraged  Follow up plan: 1 month to check glucose    Jannifer Rodney, FNP

## 2023-12-11 ENCOUNTER — Emergency Department (HOSPITAL_COMMUNITY): Payer: Medicare HMO

## 2023-12-11 ENCOUNTER — Ambulatory Visit: Payer: Medicare HMO | Admitting: Family

## 2023-12-11 ENCOUNTER — Other Ambulatory Visit: Payer: Self-pay

## 2023-12-11 ENCOUNTER — Emergency Department (HOSPITAL_COMMUNITY)
Admission: EM | Admit: 2023-12-11 | Discharge: 2023-12-11 | Disposition: A | Discharge status: Home / Self Care | Payer: Medicare HMO

## 2023-12-11 ENCOUNTER — Ambulatory Visit: Payer: Self-pay | Admitting: Family

## 2023-12-11 DIAGNOSIS — Z7982 Long term (current) use of aspirin: Secondary | ICD-10-CM | POA: Insufficient documentation

## 2023-12-11 DIAGNOSIS — S6992XA Unspecified injury of left wrist, hand and finger(s), initial encounter: Secondary | ICD-10-CM | POA: Diagnosis present

## 2023-12-11 DIAGNOSIS — G300 Alzheimer's disease with early onset: Secondary | ICD-10-CM | POA: Diagnosis not present

## 2023-12-11 DIAGNOSIS — E1159 Type 2 diabetes mellitus with other circulatory complications: Secondary | ICD-10-CM | POA: Diagnosis not present

## 2023-12-11 DIAGNOSIS — F32A Depression, unspecified: Secondary | ICD-10-CM | POA: Diagnosis not present

## 2023-12-11 DIAGNOSIS — M25532 Pain in left wrist: Secondary | ICD-10-CM | POA: Diagnosis not present

## 2023-12-11 DIAGNOSIS — F02B3 Dementia in other diseases classified elsewhere, moderate, with mood disturbance: Secondary | ICD-10-CM | POA: Diagnosis not present

## 2023-12-11 DIAGNOSIS — W19XXXA Unspecified fall, initial encounter: Secondary | ICD-10-CM | POA: Diagnosis not present

## 2023-12-11 DIAGNOSIS — S60212A Contusion of left wrist, initial encounter: Secondary | ICD-10-CM | POA: Diagnosis not present

## 2023-12-11 DIAGNOSIS — E782 Mixed hyperlipidemia: Secondary | ICD-10-CM | POA: Diagnosis not present

## 2023-12-11 DIAGNOSIS — F02B4 Dementia in other diseases classified elsewhere, moderate, with anxiety: Secondary | ICD-10-CM | POA: Diagnosis not present

## 2023-12-11 DIAGNOSIS — F02B11 Dementia in other diseases classified elsewhere, moderate, with agitation: Secondary | ICD-10-CM | POA: Diagnosis not present

## 2023-12-11 DIAGNOSIS — I700 Atherosclerosis of aorta: Secondary | ICD-10-CM | POA: Diagnosis not present

## 2023-12-11 DIAGNOSIS — M19032 Primary osteoarthritis, left wrist: Secondary | ICD-10-CM | POA: Diagnosis not present

## 2023-12-11 DIAGNOSIS — E11649 Type 2 diabetes mellitus with hypoglycemia without coma: Secondary | ICD-10-CM | POA: Diagnosis not present

## 2023-12-11 NOTE — ED Provider Notes (Signed)
 [place] EMERGENCY DEPARTMENT AT [facility] Provider Note   CSN: [id number] Arrival date & time: 12/11/23  1856     History  Chief Complaint  Patient presents with   Fall    Pt complaining of Left arm pain after fall that happened yesterday, pt states that she fell on her arm and did not hit any object on the way down. Left arm appears swollen Pt denies head injury. Denies CP and SOB   Wrist Pain    NATHASHA FIORILLO is a 73 y.o. female.  73 year old FOOSH yesterday.  Pain to the left wrist.  No numbness tingling changes in rotation.  Did not her head no LOC.  No nausea vomiting.  Husband notes that she is at her normal mentation.  No other complaints.   Fall  Wrist Pain       Home Medications Prior to Admission medications   Medication Sig Start Date End Date Taking? Authorizing Provider  Accu-Chek Softclix Lancets lancets Use to check blood sugar two times daily  Dx E11.9 07/18/21   Jannifer Rodney A, FNP  albuterol (PROVENTIL) (2.5 MG/3ML) 0.083% nebulizer solution Take 3 mLs (2.5 mg total) by nebulization every 6 (six) hours as needed for wheezing or shortness of breath. 09/07/18   Junie Spencer, FNP  Alcohol Swabs (B-D SINGLE USE SWABS REGULAR) PADS Test BS BID Dx E11.9 08/02/19   Jannifer Rodney A, FNP  amLODipine (NORVASC) 10 MG tablet TAKE 1 TABLET EVERY DAY 10/01/23   Jannifer Rodney A, FNP  aspirin EC 81 MG tablet Take 1 tablet (81 mg total) by mouth daily with breakfast. Swallow whole. 11/25/23   Shon Hale, MD  Blood Glucose Calibration (ACCU-CHEK AVIVA) SOLN Use with glucose machine Dx E11.9 08/02/19   Jannifer Rodney A, FNP  Blood Glucose Monitoring Suppl (ACCU-CHEK AVIVA PLUS) w/Device KIT Check BS BID Dx E11.9 08/02/19   Junie Spencer, FNP  Continuous Glucose Receiver (FREESTYLE LIBRE 3 READER) DEVI 1 each by Does not apply route daily. 12/01/23   Daphine Deutscher Mary-Margaret, FNP  Continuous Glucose Sensor (FREESTYLE LIBRE 3 SENSOR) MISC 1 each by Does  not apply route every 14 (fourteen) days. Place 1 sensor on the skin every 14 days. Use to check glucose continuously 12/01/23   Daphine Deutscher, Mary-Margaret, FNP  donepezil (ARICEPT) 10 MG tablet Take 1 tablet (10 mg total) by mouth at bedtime. 12/09/23   Junie Spencer, FNP  Fluticasone-Umeclidin-Vilant (TRELEGY ELLIPTA) 100-62.5-25 MCG/ACT AEPB One click each am 11/25/23   Shon Hale, MD  glucose blood (ACCU-CHEK GUIDE) test strip Use to check blood sugar two times daily  Dx E11.9 12/25/21   Jannifer Rodney A, FNP  insulin glargine (LANTUS SOLOSTAR) 100 UNIT/ML Solostar Pen Inject 15 Units into the skin at bedtime. 12/09/23   Jannifer Rodney A, FNP  Insulin Pen Needle (DROPLET PEN NEEDLES) 32G X 5 MM MISC Use with insulin daily Dx E11.9 10/03/21   Jannifer Rodney A, FNP  losartan (COZAAR) 100 MG tablet TAKE 1 TABLET EVERY DAY 10/01/23   Hawks, Neysa Bonito A, FNP  memantine (NAMENDA) 10 MG tablet Take 1 tablet (10 mg total) by mouth 2 (two) times daily. 11/25/23   Shon Hale, MD  pravastatin (PRAVACHOL) 40 MG tablet TAKE 1 TABLET EVERY DAY 10/01/23   Jannifer Rodney A, FNP  risperiDONE (RISPERDAL) 0.5 MG tablet Take 1 tablet (0.5 mg total) by mouth at bedtime. 11/25/23   Shon Hale, MD      Allergies    Lipitor [  atorvastatin] and Soma [carisoprodol]    Review of Systems   Review of Systems  Physical Exam Updated Vital Signs BP (!) 152/70   Pulse 85   Temp 97.9 F (36.6 C) (Oral)   Resp 16   SpO2 94%  Physical Exam Vitals and nursing note reviewed.  Constitutional:      General: She is not in acute distress. HENT:     Head: Normocephalic.     Nose: Nose normal.     Mouth/Throat:     Mouth: Mucous membranes are moist.  Eyes:     Conjunctiva/sclera: Conjunctivae normal.  Cardiovascular:     Rate and Rhythm: Normal rate and regular rhythm.  Pulmonary:     Effort: Pulmonary effort is normal.  Abdominal:     General: Abdomen is flat. There is no distension.     Palpations:  Abdomen is soft.     Tenderness: There is no abdominal tenderness. There is no guarding or rebound.  Musculoskeletal:        General: Normal range of motion.     Cervical back: Normal range of motion. No tenderness.     Comments: Left wrist with some minor bruising.  No scaphoid tenderness.  2+ pulses.  Neurovascularly intact.  Skin:    Capillary Refill: Capillary refill takes less than 2 seconds.  Neurological:     Mental Status: She is alert and oriented to person, place, and time.  Psychiatric:        Mood and Affect: Mood normal.        Behavior: Behavior normal.     ED Results / Procedures / Treatments   Labs (all labs ordered are listed, but only abnormal results are displayed) Labs Reviewed - No data to display  EKG None  Radiology DG Wrist Complete Left Result Date: 12/11/2023 CLINICAL DATA:  Left wrist pain after a fall. EXAM: LEFT WRIST - COMPLETE 3+ VIEW COMPARISON:  05/10/2021 FINDINGS: Diffuse bone demineralization. No evidence of acute fracture or dislocation of the left wrist. No focal bone lesion or bone destruction. Mild degenerative changes in the STT joints. IMPRESSION: No acute bony abnormalities. Electronically Signed   By: Burman Nieves M.D.   On: 12/11/2023 22:22    Procedures Procedures    Medications Ordered in ED Medications - No data to display  ED Course/ Medical Decision Making/ A&P Clinical Course as of 12/11/23 2250  Thu Dec 11, 2023  2224 DG Wrist Complete Left IMPRESSION: No acute bony abnormalities.   [TY]    Clinical Course User Index [TY] Coral Spikes, DO                                 Medical Decision Making This is a 73 year old female to the emergency department after a FOOSH.  Afebrile vital signs reassuring.  Fall yesterday, did not hit her head no LOC.  Had neurologic baseline per family with no nausea vomiting.  Low suspicion for acute intracranial pathology.  Will forego CT head at this time.  No other traumatic  injury noted on exam.  X-ray to left wrist without fracture.  She has no scaphoid tenderness.  Discussed supportive care.  Stable for discharge at this time.  Amount and/or Complexity of Data Reviewed Radiology: ordered. Decision-making details documented in ED Course.         Final Clinical Impression(s) / ED Diagnoses Final diagnoses:  None    Rx /  DC Orders ED Discharge Orders     None         Coral Spikes, DO 12/11/23 2250

## 2023-12-11 NOTE — ED Triage Notes (Signed)
 Pt complaining of Left arm pain after fall that happened yesterday, pt states that she fell on her arm and did not hit any object on the way down. Left arm appears swollen Pt denies head injury. Denies CP and SOB

## 2023-12-11 NOTE — Telephone Encounter (Signed)
 Copied from CRM [id number]. Topic: Clinical - Red Word Triage >> Dec 11, 2023  2:31 PM Geroge Baseman wrote: Red Word that prompted transfer to Nurse Triage: Patient fell today and right side hand swollen   Chief Complaint: Fall Symptoms: Right hand swelling Frequency: Single fall Disposition: [x] ED /[] Urgent Care (no appt availability in office) / [] Appointment(In office/virtual)/ []  [place] [facility]/ [] Home Care/ [] Refused Recommended Disposition /[] [place] [facility]/ []  Follow-up with PCP Additional Notes: Patient's husband called to report that a nurse came to see the patient today and advised him that she fell and that her right hand was swollen and would need an x-ray. He is not with the patient and wanted to know how he should proceed. I advised that if her hand is swollen from the fall she should be taken to the ED for evaluation. He understood and is agreeable with this plan.     Reason for Disposition  Injury (or injuries) that need emergency care  Answer Assessment - Initial Assessment Questions 1. MECHANISM: "How did the fall happen?"     Unsure 2. DOMESTIC VIOLENCE AND ELDER ABUSE SCREENING: "Did you fall because someone pushed you or tried to hurt you?" If Yes, ask: "Are you safe now?"     No 3. ONSET: "When did the fall happen?" (e.g., minutes, hours, or days ago)     Today 4. LOCATION: "What part of the body hit the ground?" (e.g., back, buttocks, head, hips, knees, hands, head, stomach)     Unsure 5. INJURY: "Did you hurt (injure) yourself when you fell?" If Yes, ask: "What did you injure? Tell me more about this?" (e.g., body area; type of injury; pain severity)"     Right hand swelling  6. PAIN: "Is there any pain?" If Yes, ask: "How bad is the pain?" (e.g., Scale 1-10; or mild,  moderate, severe)   - NONE (0): No pain   - MILD (1-3): Doesn't interfere with normal activities    - MODERATE (4-7): Interferes with normal activities or awakens from sleep    - SEVERE  (8-10): Excruciating pain, unable to do any normal activities      Unsure 7. SIZE: For cuts, bruises, or swelling, ask: "How large is it?" (e.g., inches or centimeters)      Unsure  Protocols used: Falls and [facility]

## 2023-12-11 NOTE — ED Notes (Signed)
 Pt ambulated to the restroom.

## 2023-12-11 NOTE — Discharge Instructions (Signed)
 Please follow-up with your primary doctor.  Return immediately felt sudden onset headache, vision changes, facial droop, unilateral weakness, chest pain, shortness of breath numbness tingling changes in sensation or any new or worsening symptoms that are concerning to you.  May take over-the-counter medication such as Tylenol for pain.  Please follow-up with your primary doctor.

## 2023-12-12 ENCOUNTER — Other Ambulatory Visit: Payer: Self-pay | Admitting: *Deleted

## 2023-12-12 ENCOUNTER — Telehealth: Payer: Self-pay | Admitting: Family

## 2023-12-12 NOTE — Telephone Encounter (Signed)
 TC to Debbie w/ [facility] Unable to reach her, VM full Going to give her husband's ph # [phone] to try to reach pt.

## 2023-12-12 NOTE — Telephone Encounter (Signed)
 Copied from CRM [id number]. Topic: Clinical - Medical Advice >> Dec 12, 2023 11:48 AM Shelah Lewandowsky wrote: Reason for CRM: Debbie with [facility] home health, has not been able to reach patient all week with no success, will have to try and see her next week for MSW visit, please call [phone], need another contact if possible, has been trying to call all week with no answer, will put order in for next week, faxing over, please call with any questions

## 2023-12-12 NOTE — Patient Outreach (Signed)
 Care Management  Transitions of Care Program Transitions of Care Post-discharge week 3  12/12/2023 Name: JENNELLE PINKSTAFF MRN: [id number] DOB: January 09, 1951  Subjective: LARKEN URIAS is a 73 y.o. year old female who is a primary care patient of Junie Spencer, FNP. The Care Management team was unable to reach the patient by phone to assess and address transitions of care needs.   Plan: Additional outreach attempts will be made to reach the patient enrolled in the [facility] Program (Post Inpatient/ED Visit).  Irving Shows [facility], BSN RN Care Manager/ Transition of Care [place]/ [facility] [phone]

## 2023-12-15 ENCOUNTER — Telehealth: Payer: Self-pay | Admitting: *Deleted

## 2023-12-15 NOTE — Patient Outreach (Signed)
 Care Management  Transitions of Care Program Transitions of Care Post-discharge week 3- 2nd attempt  12/15/2023 Name: Jamie [name] MRN: [id number] DOB: [birth year]  Subjective: Jamie [name] is a 73 y.o. year old female who is a primary care patient of Junie Spencer, FNP. The Care Management team was unable to reach the patient by phone to assess and address transitions of care needs.   Plan: Additional outreach attempts will be made to reach the patient enrolled in the [facility] Program (Post Inpatient/ED Visit).  Irving Shows [facility], BSN RN Care Manager/ Transition of Care [place]/ [facility] [phone]

## 2023-12-16 ENCOUNTER — Telehealth: Payer: Self-pay | Admitting: *Deleted

## 2023-12-16 ENCOUNTER — Telehealth: Payer: Self-pay | Admitting: Family Medicine

## 2023-12-16 DIAGNOSIS — F02B11 Dementia in other diseases classified elsewhere, moderate, with agitation: Secondary | ICD-10-CM | POA: Diagnosis not present

## 2023-12-16 DIAGNOSIS — F02B3 Dementia in other diseases classified elsewhere, moderate, with mood disturbance: Secondary | ICD-10-CM | POA: Diagnosis not present

## 2023-12-16 DIAGNOSIS — I700 Atherosclerosis of aorta: Secondary | ICD-10-CM | POA: Diagnosis not present

## 2023-12-16 DIAGNOSIS — E11649 Type 2 diabetes mellitus with hypoglycemia without coma: Secondary | ICD-10-CM | POA: Diagnosis not present

## 2023-12-16 DIAGNOSIS — E782 Mixed hyperlipidemia: Secondary | ICD-10-CM | POA: Diagnosis not present

## 2023-12-16 DIAGNOSIS — F02B4 Dementia in other diseases classified elsewhere, moderate, with anxiety: Secondary | ICD-10-CM | POA: Diagnosis not present

## 2023-12-16 DIAGNOSIS — E1159 Type 2 diabetes mellitus with other circulatory complications: Secondary | ICD-10-CM | POA: Diagnosis not present

## 2023-12-16 DIAGNOSIS — E162 Hypoglycemia, unspecified: Secondary | ICD-10-CM

## 2023-12-16 DIAGNOSIS — G300 Alzheimer's disease with early onset: Secondary | ICD-10-CM | POA: Diagnosis not present

## 2023-12-16 DIAGNOSIS — F32A Depression, unspecified: Secondary | ICD-10-CM | POA: Diagnosis not present

## 2023-12-16 MED ORDER — GLUCOSE INSTANT ENERGY 4-6 GM-MG PO CHEW: 1.0000 | CHEWABLE_TABLET | ORAL | 0 refills | Status: DC | PRN

## 2023-12-16 NOTE — Addendum Note (Signed)
 Addended by: Jannifer Rodney A on: 12/16/2023 04:19 PM   Modules accepted: Orders

## 2023-12-16 NOTE — Patient Outreach (Signed)
 Care Management  Transitions of Care Program Transitions of Care Post-discharge week 3- 3rd attempt  12/16/2023 Name: Jamie [name] MRN: [id number] DOB: [birth year]  Subjective: Jamie [name] is a 73 y.o. year old female who is a primary care patient of Junie Spencer, FNP. The Care Management team was unable to reach the patient by phone to assess and address transitions of care needs.   Plan: No further outreach attempts will be made at this time.  We have been unable to reach the patient.  Irving Shows [facility], BSN RN Care Manager/ Transition of Care [place]/ [facility] [phone]

## 2023-12-16 NOTE — Telephone Encounter (Signed)
 Reached out to [place], gave her pt's husband's phone number and she will attempt to reach her this way.

## 2023-12-16 NOTE — Telephone Encounter (Signed)
 Prescription sent to pharmacy.

## 2023-12-16 NOTE — Telephone Encounter (Signed)
 Patient aware and verbalized understanding. They want instant glucose sent to pharmacy so they can have on hand.

## 2023-12-16 NOTE — Telephone Encounter (Signed)
 Please verify she is taking Lantus 15 units. If so needs to decrease to 10 units.

## 2023-12-16 NOTE — Telephone Encounter (Signed)
 Copied from CRM [id number]. Topic: Clinical - Medical Advice >> Dec 16, 2023  2:10 PM Shamecia H wrote: Reason for CRM: Nurse called in and is wanting to speak to the nurse about patient because she is having episodes and had ems for blood sugar in the 40's and want to see if she can have instant glucose going forward. Nurse can be called back on [phone].

## 2023-12-17 DIAGNOSIS — I700 Atherosclerosis of aorta: Secondary | ICD-10-CM | POA: Diagnosis not present

## 2023-12-17 DIAGNOSIS — F02B3 Dementia in other diseases classified elsewhere, moderate, with mood disturbance: Secondary | ICD-10-CM | POA: Diagnosis not present

## 2023-12-17 DIAGNOSIS — F02B11 Dementia in other diseases classified elsewhere, moderate, with agitation: Secondary | ICD-10-CM | POA: Diagnosis not present

## 2023-12-17 DIAGNOSIS — F02B4 Dementia in other diseases classified elsewhere, moderate, with anxiety: Secondary | ICD-10-CM | POA: Diagnosis not present

## 2023-12-17 DIAGNOSIS — G300 Alzheimer's disease with early onset: Secondary | ICD-10-CM | POA: Diagnosis not present

## 2023-12-17 DIAGNOSIS — E11649 Type 2 diabetes mellitus with hypoglycemia without coma: Secondary | ICD-10-CM | POA: Diagnosis not present

## 2023-12-17 DIAGNOSIS — E1159 Type 2 diabetes mellitus with other circulatory complications: Secondary | ICD-10-CM | POA: Diagnosis not present

## 2023-12-17 DIAGNOSIS — F32A Depression, unspecified: Secondary | ICD-10-CM | POA: Diagnosis not present

## 2023-12-17 DIAGNOSIS — E782 Mixed hyperlipidemia: Secondary | ICD-10-CM | POA: Diagnosis not present

## 2023-12-19 ENCOUNTER — Ambulatory Visit: Payer: Self-pay | Admitting: Family

## 2023-12-19 DIAGNOSIS — F02B3 Dementia in other diseases classified elsewhere, moderate, with mood disturbance: Secondary | ICD-10-CM | POA: Diagnosis not present

## 2023-12-19 DIAGNOSIS — F02B11 Dementia in other diseases classified elsewhere, moderate, with agitation: Secondary | ICD-10-CM | POA: Diagnosis not present

## 2023-12-19 DIAGNOSIS — F02B4 Dementia in other diseases classified elsewhere, moderate, with anxiety: Secondary | ICD-10-CM | POA: Diagnosis not present

## 2023-12-19 DIAGNOSIS — I700 Atherosclerosis of aorta: Secondary | ICD-10-CM | POA: Diagnosis not present

## 2023-12-19 DIAGNOSIS — E1159 Type 2 diabetes mellitus with other circulatory complications: Secondary | ICD-10-CM | POA: Diagnosis not present

## 2023-12-19 DIAGNOSIS — G300 Alzheimer's disease with early onset: Secondary | ICD-10-CM | POA: Diagnosis not present

## 2023-12-19 DIAGNOSIS — E11649 Type 2 diabetes mellitus with hypoglycemia without coma: Secondary | ICD-10-CM | POA: Diagnosis not present

## 2023-12-19 DIAGNOSIS — F32A Depression, unspecified: Secondary | ICD-10-CM | POA: Diagnosis not present

## 2023-12-19 DIAGNOSIS — E782 Mixed hyperlipidemia: Secondary | ICD-10-CM | POA: Diagnosis not present

## 2023-12-19 NOTE — Telephone Encounter (Signed)
 Chief Complaint: Low Blood Sugar Symptoms: None at moment Disposition: [] ED /[] Urgent Care (no appt availability in office) / [] Appointment(In office/virtual)/ []  [place] [facility]/ [] Home Care/ [] Refused Recommended Disposition /[] [place] [facility]/ [x]  Follow-up with PCP Additional Notes: Spoke with pt's home health nurse, Jill Side. Per Jill Side, pt's husband called EMS yesterday as pt was found on couch with a blood sugar of 34. Blood sugar this morning at 5 AM was 57 and at 9 AM 60. Pt blood sugar right now is 95.   **Pt home health nurse is wanting to ask PCP if pt should continue to hold her insulin as she has been holding it due to recent low blood sugar issues. Colleen, home health nurse, call back number is [phone]     Copied from CRM [id number]. Topic: Clinical - Red Word Triage >> Dec 19, 2023 10:32 AM Clayton Bibles wrote: Red Word that prompted transfer to Nurse Triage: Alvino Chapel with [facility] is calling about Deshondra blood sugar level. They are very low. Alvino Chapel wants to talk to a nurse to discuss Reason for Disposition  Low blood sugar prevention, questions about  Answer Assessment - Initial Assessment Questions 1. SYMPTOMS: "What symptoms are you concerned about?"     Low blood sugar 2. ONSET:  "When did the symptoms start?"     No symptoms 3. BLOOD GLUCOSE: "What is your blood glucose level?"      95 now  Protocols used: Diabetes - Low Blood Sugar-A-AH

## 2023-12-19 NOTE — Telephone Encounter (Signed)
 CALLED BACK AND SPOKE WITH rn ADVISED

## 2023-12-19 NOTE — Telephone Encounter (Signed)
 If glucose continues to drop, please tell her to hold her insulin. Her husband needs to only administer.  We have her currently only getting 15 units. If still dropping with that we can decrease to 8 units.

## 2023-12-22 ENCOUNTER — Other Ambulatory Visit: Payer: Self-pay | Admitting: Family

## 2023-12-22 DIAGNOSIS — I10 Essential (primary) hypertension: Secondary | ICD-10-CM

## 2023-12-23 DIAGNOSIS — F02B4 Dementia in other diseases classified elsewhere, moderate, with anxiety: Secondary | ICD-10-CM | POA: Diagnosis not present

## 2023-12-23 DIAGNOSIS — F02B3 Dementia in other diseases classified elsewhere, moderate, with mood disturbance: Secondary | ICD-10-CM | POA: Diagnosis not present

## 2023-12-23 DIAGNOSIS — I700 Atherosclerosis of aorta: Secondary | ICD-10-CM | POA: Diagnosis not present

## 2023-12-23 DIAGNOSIS — E11649 Type 2 diabetes mellitus with hypoglycemia without coma: Secondary | ICD-10-CM | POA: Diagnosis not present

## 2023-12-23 DIAGNOSIS — E1159 Type 2 diabetes mellitus with other circulatory complications: Secondary | ICD-10-CM | POA: Diagnosis not present

## 2023-12-23 DIAGNOSIS — G300 Alzheimer's disease with early onset: Secondary | ICD-10-CM | POA: Diagnosis not present

## 2023-12-23 DIAGNOSIS — F32A Depression, unspecified: Secondary | ICD-10-CM | POA: Diagnosis not present

## 2023-12-23 DIAGNOSIS — F02B11 Dementia in other diseases classified elsewhere, moderate, with agitation: Secondary | ICD-10-CM | POA: Diagnosis not present

## 2023-12-23 DIAGNOSIS — E782 Mixed hyperlipidemia: Secondary | ICD-10-CM | POA: Diagnosis not present

## 2023-12-24 DIAGNOSIS — F02B11 Dementia in other diseases classified elsewhere, moderate, with agitation: Secondary | ICD-10-CM | POA: Diagnosis not present

## 2023-12-24 DIAGNOSIS — F32A Depression, unspecified: Secondary | ICD-10-CM | POA: Diagnosis not present

## 2023-12-24 DIAGNOSIS — F02B3 Dementia in other diseases classified elsewhere, moderate, with mood disturbance: Secondary | ICD-10-CM | POA: Diagnosis not present

## 2023-12-24 DIAGNOSIS — E11649 Type 2 diabetes mellitus with hypoglycemia without coma: Secondary | ICD-10-CM | POA: Diagnosis not present

## 2023-12-24 DIAGNOSIS — F02B4 Dementia in other diseases classified elsewhere, moderate, with anxiety: Secondary | ICD-10-CM | POA: Diagnosis not present

## 2023-12-24 DIAGNOSIS — E1159 Type 2 diabetes mellitus with other circulatory complications: Secondary | ICD-10-CM | POA: Diagnosis not present

## 2023-12-24 DIAGNOSIS — I700 Atherosclerosis of aorta: Secondary | ICD-10-CM | POA: Diagnosis not present

## 2023-12-24 DIAGNOSIS — E782 Mixed hyperlipidemia: Secondary | ICD-10-CM | POA: Diagnosis not present

## 2023-12-24 DIAGNOSIS — G300 Alzheimer's disease with early onset: Secondary | ICD-10-CM | POA: Diagnosis not present

## 2023-12-30 DIAGNOSIS — E1159 Type 2 diabetes mellitus with other circulatory complications: Secondary | ICD-10-CM | POA: Diagnosis not present

## 2023-12-30 DIAGNOSIS — E11649 Type 2 diabetes mellitus with hypoglycemia without coma: Secondary | ICD-10-CM | POA: Diagnosis not present

## 2023-12-30 DIAGNOSIS — F02B4 Dementia in other diseases classified elsewhere, moderate, with anxiety: Secondary | ICD-10-CM | POA: Diagnosis not present

## 2023-12-30 DIAGNOSIS — I700 Atherosclerosis of aorta: Secondary | ICD-10-CM | POA: Diagnosis not present

## 2023-12-30 DIAGNOSIS — F32A Depression, unspecified: Secondary | ICD-10-CM | POA: Diagnosis not present

## 2023-12-30 DIAGNOSIS — E782 Mixed hyperlipidemia: Secondary | ICD-10-CM | POA: Diagnosis not present

## 2023-12-30 DIAGNOSIS — F02B11 Dementia in other diseases classified elsewhere, moderate, with agitation: Secondary | ICD-10-CM | POA: Diagnosis not present

## 2023-12-30 DIAGNOSIS — F02B3 Dementia in other diseases classified elsewhere, moderate, with mood disturbance: Secondary | ICD-10-CM | POA: Diagnosis not present

## 2023-12-30 DIAGNOSIS — G300 Alzheimer's disease with early onset: Secondary | ICD-10-CM | POA: Diagnosis not present

## 2024-01-02 DIAGNOSIS — F02B11 Dementia in other diseases classified elsewhere, moderate, with agitation: Secondary | ICD-10-CM | POA: Diagnosis not present

## 2024-01-02 DIAGNOSIS — F02B4 Dementia in other diseases classified elsewhere, moderate, with anxiety: Secondary | ICD-10-CM | POA: Diagnosis not present

## 2024-01-02 DIAGNOSIS — F02B3 Dementia in other diseases classified elsewhere, moderate, with mood disturbance: Secondary | ICD-10-CM | POA: Diagnosis not present

## 2024-01-02 DIAGNOSIS — F32A Depression, unspecified: Secondary | ICD-10-CM | POA: Diagnosis not present

## 2024-01-02 DIAGNOSIS — E1159 Type 2 diabetes mellitus with other circulatory complications: Secondary | ICD-10-CM | POA: Diagnosis not present

## 2024-01-02 DIAGNOSIS — G300 Alzheimer's disease with early onset: Secondary | ICD-10-CM | POA: Diagnosis not present

## 2024-01-02 DIAGNOSIS — E782 Mixed hyperlipidemia: Secondary | ICD-10-CM | POA: Diagnosis not present

## 2024-01-02 DIAGNOSIS — I700 Atherosclerosis of aorta: Secondary | ICD-10-CM | POA: Diagnosis not present

## 2024-01-02 DIAGNOSIS — E11649 Type 2 diabetes mellitus with hypoglycemia without coma: Secondary | ICD-10-CM | POA: Diagnosis not present

## 2024-01-06 ENCOUNTER — Encounter: Payer: Self-pay | Admitting: Family

## 2024-01-06 ENCOUNTER — Ambulatory Visit (INDEPENDENT_AMBULATORY_CARE_PROVIDER_SITE_OTHER): Payer: Medicare HMO | Admitting: Family

## 2024-01-06 VITALS — BP 105/60 | HR 83 | Temp 97.7°F | Ht 64.0 in

## 2024-01-06 DIAGNOSIS — E1169 Type 2 diabetes mellitus with other specified complication: Secondary | ICD-10-CM

## 2024-01-06 DIAGNOSIS — I700 Atherosclerosis of aorta: Secondary | ICD-10-CM | POA: Diagnosis not present

## 2024-01-06 DIAGNOSIS — F32A Depression, unspecified: Secondary | ICD-10-CM | POA: Diagnosis not present

## 2024-01-06 DIAGNOSIS — M5416 Radiculopathy, lumbar region: Secondary | ICD-10-CM

## 2024-01-06 DIAGNOSIS — G8929 Other chronic pain: Secondary | ICD-10-CM

## 2024-01-06 DIAGNOSIS — Z794 Long term (current) use of insulin: Secondary | ICD-10-CM | POA: Diagnosis not present

## 2024-01-06 DIAGNOSIS — M5441 Lumbago with sciatica, right side: Secondary | ICD-10-CM

## 2024-01-06 DIAGNOSIS — I10 Essential (primary) hypertension: Secondary | ICD-10-CM | POA: Diagnosis not present

## 2024-01-06 DIAGNOSIS — I2510 Atherosclerotic heart disease of native coronary artery without angina pectoris: Secondary | ICD-10-CM

## 2024-01-06 DIAGNOSIS — E162 Hypoglycemia, unspecified: Secondary | ICD-10-CM | POA: Diagnosis not present

## 2024-01-06 DIAGNOSIS — E1159 Type 2 diabetes mellitus with other circulatory complications: Secondary | ICD-10-CM | POA: Diagnosis not present

## 2024-01-06 DIAGNOSIS — Z23 Encounter for immunization: Secondary | ICD-10-CM

## 2024-01-06 DIAGNOSIS — G308 Other Alzheimer's disease: Secondary | ICD-10-CM

## 2024-01-06 DIAGNOSIS — F02B11 Dementia in other diseases classified elsewhere, moderate, with agitation: Secondary | ICD-10-CM

## 2024-01-06 DIAGNOSIS — J410 Simple chronic bronchitis: Secondary | ICD-10-CM | POA: Diagnosis not present

## 2024-01-06 DIAGNOSIS — E11649 Type 2 diabetes mellitus with hypoglycemia without coma: Secondary | ICD-10-CM | POA: Diagnosis not present

## 2024-01-06 DIAGNOSIS — F02B3 Dementia in other diseases classified elsewhere, moderate, with mood disturbance: Secondary | ICD-10-CM | POA: Diagnosis not present

## 2024-01-06 DIAGNOSIS — R636 Underweight: Secondary | ICD-10-CM

## 2024-01-06 DIAGNOSIS — K219 Gastro-esophageal reflux disease without esophagitis: Secondary | ICD-10-CM

## 2024-01-06 DIAGNOSIS — F17200 Nicotine dependence, unspecified, uncomplicated: Secondary | ICD-10-CM

## 2024-01-06 DIAGNOSIS — E782 Mixed hyperlipidemia: Secondary | ICD-10-CM | POA: Diagnosis not present

## 2024-01-06 DIAGNOSIS — G300 Alzheimer's disease with early onset: Secondary | ICD-10-CM | POA: Diagnosis not present

## 2024-01-06 DIAGNOSIS — F02B4 Dementia in other diseases classified elsewhere, moderate, with anxiety: Secondary | ICD-10-CM | POA: Diagnosis not present

## 2024-01-06 DIAGNOSIS — E08649 Diabetes mellitus due to underlying condition with hypoglycemia without coma: Secondary | ICD-10-CM | POA: Diagnosis not present

## 2024-01-06 DIAGNOSIS — Z716 Tobacco abuse counseling: Secondary | ICD-10-CM

## 2024-01-06 LAB — CMP14+EGFR
ALT: 10 IU/L (ref 0–32)
AST: 14 IU/L (ref 0–40)
Albumin: 3.9 g/dL (ref 3.8–4.8)
Alkaline Phosphatase: 160 IU/L — ABNORMAL HIGH (ref 44–121)
BUN/Creatinine Ratio: 21 (ref 12–28)
BUN: 20 mg/dL (ref 8–27)
Bilirubin Total: 0.3 mg/dL (ref 0.0–1.2)
CO2: 22 mmol/L (ref 20–29)
Calcium: 9.2 mg/dL (ref 8.7–10.3)
Chloride: 97 mmol/L (ref 96–106)
Creatinine, Ser: 0.94 mg/dL (ref 0.57–1.00)
Globulin, Total: 2.5 g/dL (ref 1.5–4.5)
Glucose: 388 mg/dL — ABNORMAL HIGH (ref 70–99)
Potassium: 4.1 mmol/L (ref 3.5–5.2)
Sodium: 134 mmol/L (ref 134–144)
Total Protein: 6.4 g/dL (ref 6.0–8.5)
eGFR: 65 mL/min/{1.73_m2} (ref >59)

## 2024-01-06 LAB — BAYER DCA HB A1C WAIVED: HB A1C (BAYER DCA - WAIVED): 6.9 % — ABNORMAL HIGH (ref 4.8–5.6)

## 2024-01-06 LAB — GLUCOSE HEMOCUE WAIVED: Glu Hemocue Waived: 426 mg/dL — ABNORMAL HIGH (ref 70–99)

## 2024-01-06 MED ORDER — LANCET DEVICE MISC: 1.0000 | Freq: Three times a day (TID) | 0 refills | Status: AC

## 2024-01-06 MED ORDER — LANCETS MISC. MISC: 1.0000 | Freq: Three times a day (TID) | 0 refills | Status: AC

## 2024-01-06 MED ORDER — LANTUS SOLOSTAR 100 UNIT/ML ~~LOC~~ SOPN: 15.0000 [IU] | PEN_INJECTOR | Freq: Every day | SUBCUTANEOUS | 1 refills | Status: DC

## 2024-01-06 MED ORDER — BLOOD GLUCOSE TEST VI STRP: 1.0000 | ORAL_STRIP | Freq: Three times a day (TID) | 0 refills | Status: AC

## 2024-01-06 MED ORDER — BLOOD GLUCOSE MONITORING SUPPL DEVI: 1.0000 | Freq: Three times a day (TID) | 0 refills | Status: DC

## 2024-01-06 NOTE — Patient Instructions (Addendum)
 Will increase Lantus to 15 units for 3 days, if fasting glucose >180 increase to 20 units.   Hypoglycemia Hypoglycemia is when the amount of sugar, or glucose, in your blood is too low. Low blood sugar can happen if you have diabetes or if you don't have diabetes. It may be an emergency. What are the causes? Low blood sugar happens most often in people who have diabetes. It may be caused by: Diabetes medicine. Not eating enough, or not eating often enough. Being more active than normal. If you don't have diabetes, you may still get low blood sugar if: There's a tumor in your pancreas. A tumor is a growth of cells that isn't normal. You don't eat enough, or you fast. Fasting is when you don't eat for long periods at a time. You have a bad infection or illness. You have problems after weight loss surgery. You have kidney or liver problems. You take certain medicines. What increases the risk? You're more likely to have low blood sugar if: You have diabetes and take medicine for it. You drink a lot of alcohol. You get sick. What are the signs or symptoms? Mild Hunger or feeling like you may vomit. Sweating and feeling cold to the touch. Feeling dizzy or light-headed. Being sleepy or having trouble sleeping. A headache. Blurry vision. Mood changes. These include feeling worried, nervous, or easily annoyed. Moderate Feeling confused. Changes in the way you act. Weakness. An uneven heartbeat. Very bad Having very low blood sugar is an emergency. It can cause: Fainting. Seizures. A coma. Death. How is this diagnosed?  Low blood sugar can be found with a blood test. This test tells you how much sugar is in your blood. It's done while you're having symptoms. Your health care provider may also do an exam and look at your medical history. How is this treated? Treating low blood sugar If you have low blood sugar, eat or drink something with sugar in it right away. The food or drink  should have 15 grams of a fast-acting carbohydrate (carb). Options include: 4 oz (120 mL) of fruit juice. 4 oz (120 mL) of soda (not diet soda). A few pieces of hard candy. Check food labels to see how many pieces to eat. 1 Tbsp (15 mL) of sugar or honey. 4 glucose tablets. 1 tube of glucose gel. Treating low blood sugar if you have diabetes Talk with your provider about how much carb you should take. If you're alert and can swallow safely, you may follow the 15:15 rule: Take 15 grams of a fast-acting carb. Check your blood sugar 15 minutes after you take the carb. If your blood sugar is still at or below 70 mg/dL (3.9 mmol/L), take 15 grams of a carb again. If your blood sugar doesn't go above 70 mg/dL (3.9 mmol/L) after 3 tries, get help right away. After your blood sugar goes back to normal, eat a meal or a snack within 1 hour. Always keep 15 grams of a fast-acting carb with you. This could be: 4 glucose tablets. A few pieces of hard candy. 1 Tbsp (15 mL) of honey or sugar. 1 tube of glucose gel. Treating very low blood sugar If your blood sugar is less than 54 mg/dL (3 mmol/L), it's an emergency. Get help right away. If you can't eat or drink, you will need to be given glucagon. A family member or friend should learn how to check your blood sugar and give you glucagon. Ask your provider if  you should keep a glucagon kit at home. You may also need to be treated in a hospital. Follow these instructions at home: If you have diabetes: Always keep a fast-acting carb (15 grams) with you. Follow your diabetes care plan. Make sure you: Know the symptoms of low blood sugar. Check your blood sugar as often as told. Always check it before and after you exercise. Always check your blood sugar before you drive. Take your medicines as told. Eat on time. Do not skip meals. Share your diabetes care plan with: Your work or school. The people you live with. Wear an alert bracelet or carry a card  that says you have diabetes. General instructions If you drink alcohol: Limit how much you have to: 0-1 drink a day if you're female. 0-2 drinks a day if you're female. Know how much alcohol is in your drink. In the [place] one drink is one 12 oz bottle of beer (355 mL), one 5 oz glass of wine (148 mL), or one 1 oz glass of hard liquor (44 mL). Be sure to eat food when you drink alcohol. Be sure to check your blood sugar after you drink. Alcohol may lead to low blood sugar later. Where to find more information American Diabetes Association (ADA): diabetes.org Contact a health care provider if: You have low blood sugar often. You have diabetes and are having trouble keeping your blood sugar in the right range. Get help right away if: You can't get your blood sugar above 70 mg/dL (3.9 mmol/L) after 3 tries. Your blood sugar is below 54 mg/dL (3 mmol/L). You have a seizure. You faint. These symptoms may be an emergency. Call 911 right away. Do not wait to see if the symptoms will go away. Do not drive yourself to the hospital. This information is not intended to replace advice given to you by your health care provider. Make sure you discuss any questions you have with your health care provider. Document Revised: 07/03/2023 Document Reviewed: 12/18/2022 Elsevier Patient Education  2024 [employer].

## 2024-01-06 NOTE — Progress Notes (Signed)
 Subjective:    Patient ID: Jamie [name], female    DOB: 1951-04-19, 73 y.o.   MRN: [id number]  Chief Complaint  Patient presents with   Follow-up    Dm    PT presents to the office today for chronic follow up on DM.   Pt had been having hypoglycemia episodes of 34-40. She has memory changes and was giving herself her insulin. We decreased her  Lantus 15 units then 8 units. However, her husband is now giving her the insulin and her glucose is 300-440's.   PT had surgical decompression and stabilization from L2-L4 on 02/05/17. Pt states her back continues to hurt and her pain is a 8 out 10 when she walks or standing.    She has CAD and takes statin daily.   She has COPD and has SOB and wheezing. She continues to smoke less than 1/2 pack a day. Uses Trelegy daily.    Pt has dementia and taking Aricept 10 mg and Namenda 10 mg.   She was hospitalized 11/23/23-11/25/23 after a fall and hypoglycemia.  Back Pain This is a chronic problem. The current episode started more than 1 year ago. The problem occurs intermittently. The pain is present in the lumbar spine. The pain is at a severity of 9/10. The pain is moderate. Associated symptoms include leg pain. She has tried analgesics for the symptoms.  Diabetes She presents for her follow-up diabetic visit. She has type 2 diabetes mellitus. Hypoglycemia symptoms include confusion. Pertinent negatives for diabetes include no blurred vision and no foot paresthesias. Risk factors for coronary artery disease include dyslipidemia, diabetes mellitus, hypertension and sedentary lifestyle. She is following a generally healthy diet. Her overall blood glucose range is >200 mg/dl.  Hypertension This is a chronic problem. The current episode started more than 1 year ago. The problem has been resolved since onset. The problem is uncontrolled. Associated symptoms include shortness of breath. Pertinent negatives include no blurred vision or peripheral edema.  Risk factors for coronary artery disease include dyslipidemia, diabetes mellitus, obesity and sedentary lifestyle. The current treatment provides moderate improvement.  Nicotine Dependence Presents for follow-up visit. Her urge triggers include company of smokers. She smokes < 1/2 a pack of cigarettes per day.      Review of Systems  Eyes:  Negative for blurred vision.  Respiratory:  Positive for shortness of breath.   Psychiatric/Behavioral:  Positive for confusion.   All other systems reviewed and are negative.  Family History  Problem Relation Age of Onset   Heart attack Mother    Stroke Mother    Stroke Sister    Diabetes Other        sister x3, and mother   Lung disease Father        worked in Forensic psychologist cancer Neg Hx    Colon polyps Neg Hx    Social History   Socioeconomic History   Marital status: Married    Spouse name: Dorene Sorrow   Number of children: 1   Years of education: 12   Highest education level: High school graduate  Occupational History   Occupation: disability  Tobacco Use   Smoking status: Every Day    Current packs/day: 1.00    Average packs/day: 1 pack/day for 56.4 years (56.4 ttl pk-yrs)    Types: Cigarettes    Start date: 08/11/1967   Smokeless tobacco: Never  Vaping Use   Vaping status: Never Used  Substance and Sexual Activity  Alcohol use: No   Drug use: No   Sexual activity: Not Currently    Birth control/protection: None  Other Topics Concern   Not on file  Social History Narrative   Retired/disabled. Lives at home with her husband. He still works part-time. Currently following social distancing guidelines.    Social Drivers of Corporate investment banker Strain: Low Risk  (12/30/2022)   Overall Financial Resource Strain (CARDIA)    Difficulty of Paying Living Expenses: Not hard at all  Food Insecurity: No Food Insecurity (11/27/2023)   Hunger Vital Sign    Worried About Running Out of Food in the Last Year: Never true     Ran Out of Food in the Last Year: Never true  Transportation Needs: No Transportation Needs (11/27/2023)   PRAPARE - Administrator, Civil Service (Medical): No    Lack of Transportation (Non-Medical): No  Physical Activity: Insufficiently Active (12/30/2022)   Exercise Vital Sign    Days of Exercise per Week: 3 days    Minutes of Exercise per Session: 30 min  Stress: No Stress Concern Present (12/30/2022)   [employer] of Occupational Health - Occupational Stress Questionnaire    Feeling of Stress : Only a little  Social Connections: Unknown (11/24/2023)   Social Connection and Isolation Panel [NHANES]    Frequency of Communication with Friends and Family: Patient unable to answer    Frequency of Social Gatherings with Friends and Family: Patient unable to answer    Attends Religious Services: Not on file    Active Member of Clubs or Organizations: Not on file    Attends Banker Meetings: Not on file    Marital Status: Not on file        Objective:   Physical Exam Vitals reviewed.  Constitutional:      General: She is not in acute distress.    Appearance: She is well-developed.  HENT:     Head: Normocephalic and atraumatic.     Right Ear: Tympanic membrane normal.     Left Ear: Tympanic membrane normal.     Ears:     Comments: HOH  Eyes:     Pupils: Pupils are equal, round, and reactive to light.  Neck:     Thyroid: No thyromegaly.  Cardiovascular:     Rate and Rhythm: Regular rhythm.     Heart sounds: Normal heart sounds. No murmur heard. Pulmonary:     Effort: Pulmonary effort is normal. No respiratory distress.     Breath sounds: Wheezing present.  Abdominal:     General: Bowel sounds are normal. There is no distension.     Palpations: Abdomen is soft.     Tenderness: There is no abdominal tenderness.  Musculoskeletal:        General: Tenderness present.     Cervical back: Normal range of motion and neck supple.     Comments: Pain in  lumbar, walking at 80 degrees   Skin:    General: Skin is warm and dry.     Coloration: Skin is pale.  Neurological:     Mental Status: She is alert and oriented to person, place, and time.     Cranial Nerves: No cranial nerve deficit.     Deep Tendon Reflexes: Reflexes are normal and symmetric.  Psychiatric:        Behavior: Behavior normal.        Thought Content: Thought content normal.  Judgment: Judgment normal.      BP 105/60   Pulse 83   Temp 97.7 F (36.5 C) (Temporal)   Ht 5\' 4"  (1.626 m)   SpO2 95%   BMI 15.79 kg/m      Assessment & Plan:   JAKAILA NORMENT comes in today with chief complaint of Follow-up (Dm )   Diagnosis and orders addressed:  1. Hypoglycemia - CMP14+EGFR - Glucose Hemocue Waived  2. Diabetes mellitus due to underlying condition with hypoglycemia without coma, with long-term current use of insulin (HCC) (Primary) - Bayer DCA Hb A1c Waived - CMP14+EGFR - Blood Glucose Monitoring Suppl DEVI; 1 each by Does not apply route in the morning, at noon, and at bedtime. May substitute to any manufacturer covered by patient's insurance.  Dispense: 1 each; Refill: 0 - Glucose Blood (BLOOD GLUCOSE TEST STRIPS) STRP; 1 each by In Vitro route in the morning, at noon, and at bedtime. May substitute to any manufacturer covered by patient's insurance.  Dispense: 100 strip; Refill: 0 - Lancet Device MISC; 1 each by Does not apply route in the morning, at noon, and at bedtime. May substitute to any manufacturer covered by patient's insurance.  Dispense: 1 each; Refill: 0 - Lancets Misc. MISC; 1 each by Does not apply route in the morning, at noon, and at bedtime. May substitute to any manufacturer covered by patient's insurance.  Dispense: 100 each; Refill: 0 - insulin glargine (LANTUS SOLOSTAR) 100 UNIT/ML Solostar Pen; Inject 15 Units into the skin at bedtime.  Dispense: 30 mL; Refill: 1  3. Moderate Alzheimer's dementia of other onset with agitation  (HCC) - CMP14+EGFR  4. Chronic bilateral low back pain with right-sided sciatica - CMP14+EGFR  5. Simple chronic bronchitis (HCC) - CMP14+EGFR  6. Coronary artery calcification - CMP14+EGFR  7. Current smoker - CMP14+EGFR  8. DM type 2 causing vascular disease (HCC) - CMP14+EGFR  9. Essential hypertension, benign  - CMP14+EGFR  10. Gastroesophageal reflux disease without esophagitis - CMP14+EGFR  11. Hyperlipidemia associated with type 2 diabetes mellitus (HCC)  - CMP14+EGFR  12. Lumbar radiculopathy, chronic - CMP14+EGFR  13. Underweight - CMP14+EGFR  14. Tobacco abuse counseling - CMP14+EGFR  Labs pending Will increase Lantus to 15 units from 8 units, after three days will increase to 20 units if fasting glucose is >180 Continue current medications  Health Maintenance reviewed Diet and exercise encouraged  Follow up plan: 1 month to check glucose    Jannifer Rodney, FNP

## 2024-01-14 ENCOUNTER — Telehealth: Payer: Self-pay | Admitting: Family

## 2024-01-15 DIAGNOSIS — E1159 Type 2 diabetes mellitus with other circulatory complications: Secondary | ICD-10-CM | POA: Diagnosis not present

## 2024-01-15 DIAGNOSIS — F02B3 Dementia in other diseases classified elsewhere, moderate, with mood disturbance: Secondary | ICD-10-CM | POA: Diagnosis not present

## 2024-01-15 DIAGNOSIS — F02B11 Dementia in other diseases classified elsewhere, moderate, with agitation: Secondary | ICD-10-CM | POA: Diagnosis not present

## 2024-01-15 DIAGNOSIS — F32A Depression, unspecified: Secondary | ICD-10-CM | POA: Diagnosis not present

## 2024-01-15 DIAGNOSIS — F02B4 Dementia in other diseases classified elsewhere, moderate, with anxiety: Secondary | ICD-10-CM | POA: Diagnosis not present

## 2024-01-15 DIAGNOSIS — E11649 Type 2 diabetes mellitus with hypoglycemia without coma: Secondary | ICD-10-CM | POA: Diagnosis not present

## 2024-01-15 DIAGNOSIS — E782 Mixed hyperlipidemia: Secondary | ICD-10-CM | POA: Diagnosis not present

## 2024-01-15 DIAGNOSIS — I700 Atherosclerosis of aorta: Secondary | ICD-10-CM | POA: Diagnosis not present

## 2024-01-15 DIAGNOSIS — G300 Alzheimer's disease with early onset: Secondary | ICD-10-CM | POA: Diagnosis not present

## 2024-01-27 ENCOUNTER — Telehealth: Payer: Self-pay | Admitting: Family

## 2024-01-27 NOTE — Telephone Encounter (Signed)
 Josefina Nian (Husband) dropped off S S Form forms to be completed and signed.  Form Fee Paid? (Y/N)       No Charge--per [facility]     If NO, form is placed on front office manager desk to hold until payment received. If YES, then form will be placed in the RX/HH Nurse Coordinators box for completion.  Form will not be processed until payment is received   Call husband-Jerry, when form is ready!

## 2024-01-28 DIAGNOSIS — E1159 Type 2 diabetes mellitus with other circulatory complications: Secondary | ICD-10-CM | POA: Diagnosis not present

## 2024-01-28 DIAGNOSIS — F02B3 Dementia in other diseases classified elsewhere, moderate, with mood disturbance: Secondary | ICD-10-CM | POA: Diagnosis not present

## 2024-01-28 DIAGNOSIS — F02B4 Dementia in other diseases classified elsewhere, moderate, with anxiety: Secondary | ICD-10-CM | POA: Diagnosis not present

## 2024-01-28 DIAGNOSIS — E782 Mixed hyperlipidemia: Secondary | ICD-10-CM | POA: Diagnosis not present

## 2024-01-28 DIAGNOSIS — I700 Atherosclerosis of aorta: Secondary | ICD-10-CM | POA: Diagnosis not present

## 2024-01-28 DIAGNOSIS — E11649 Type 2 diabetes mellitus with hypoglycemia without coma: Secondary | ICD-10-CM | POA: Diagnosis not present

## 2024-01-28 DIAGNOSIS — F32A Depression, unspecified: Secondary | ICD-10-CM | POA: Diagnosis not present

## 2024-01-28 DIAGNOSIS — F02B11 Dementia in other diseases classified elsewhere, moderate, with agitation: Secondary | ICD-10-CM | POA: Diagnosis not present

## 2024-01-28 DIAGNOSIS — G300 Alzheimer's disease with early onset: Secondary | ICD-10-CM | POA: Diagnosis not present

## 2024-02-09 ENCOUNTER — Encounter: Payer: Self-pay | Admitting: Family

## 2024-02-09 ENCOUNTER — Ambulatory Visit (INDEPENDENT_AMBULATORY_CARE_PROVIDER_SITE_OTHER): Admitting: Family

## 2024-02-09 VITALS — BP 106/59 | HR 97 | Temp 97.4°F | Ht 63.0 in | Wt 87.0 lb

## 2024-02-09 DIAGNOSIS — E1142 Type 2 diabetes mellitus with diabetic polyneuropathy: Secondary | ICD-10-CM | POA: Diagnosis not present

## 2024-02-09 DIAGNOSIS — E43 Unspecified severe protein-calorie malnutrition: Secondary | ICD-10-CM | POA: Diagnosis not present

## 2024-02-09 DIAGNOSIS — F17200 Nicotine dependence, unspecified, uncomplicated: Secondary | ICD-10-CM | POA: Diagnosis not present

## 2024-02-09 DIAGNOSIS — E1159 Type 2 diabetes mellitus with other circulatory complications: Secondary | ICD-10-CM

## 2024-02-09 DIAGNOSIS — Z794 Long term (current) use of insulin: Secondary | ICD-10-CM

## 2024-02-09 DIAGNOSIS — F02B11 Dementia in other diseases classified elsewhere, moderate, with agitation: Secondary | ICD-10-CM

## 2024-02-09 DIAGNOSIS — G300 Alzheimer's disease with early onset: Secondary | ICD-10-CM | POA: Diagnosis not present

## 2024-02-09 NOTE — Progress Notes (Addendum)
 Subjective:    Patient ID: Jamie [name], female    DOB: September 08, 1951, 73 y.o.   MRN: [id number]  Chief Complaint  Patient presents with   Follow-up   PT presents to the office today to recheck DM.   She had been having hypoglycemia episodes of 34-40. She has memory changes and was giving herself her insulin . We decreased her Lantus  15 units then 8 units. However, her husband started giving her the insulin  and her glucose is 300-440's. We increased her Lantus  15 units and her glucose in AM has been 130-160.   Reports her confusion has slightly improved. She has dementia and takes Namenda  and Aricept .   She has lost 5 lbs since our last visit.      02/09/2024   11:02 AM 12/09/2023   11:00 AM 12/01/2023   11:08 AM  Last 3 Weights  Weight (lbs) 87 lb 92 lb 94 lb 6.4 oz  Weight (kg) 39.463 kg 41.731 kg 42.82 kg     Diabetes She presents for her follow-up diabetic visit. She has type 2 diabetes mellitus. Hypoglycemia symptoms include sleepiness. Associated symptoms include foot paresthesias. Pertinent negatives for diabetes include no blurred vision. Symptoms are stable. Diabetic complications include peripheral neuropathy. Risk factors for coronary artery disease include dyslipidemia, diabetes mellitus, hypertension, sedentary lifestyle and post-menopausal. She is following a generally healthy diet. Her overall blood glucose range is 130-140 mg/dl.  Nicotine Dependence Presents for follow-up visit. Her urge triggers include company of smokers. She smokes < 1/2 a pack of cigarettes per day.      Review of Systems  Eyes:  Negative for blurred vision.  All other systems reviewed and are negative.   Social History   Socioeconomic History   Marital status: Married    Spouse name: Josefina Nian   Number of children: 1   Years of education: 12   Highest education level: High school graduate  Occupational History   Occupation: disability  Tobacco Use   Smoking status: Every Day     Current packs/day: 1.00    Average packs/day: 1 pack/day for 56.5 years (56.5 ttl pk-yrs)    Types: Cigarettes    Start date: 08/11/1967   Smokeless tobacco: Never  Vaping Use   Vaping status: Never Used  Substance and Sexual Activity   Alcohol use: No   Drug use: No   Sexual activity: Not Currently    Birth control/protection: None  Other Topics Concern   Not on file  Social History Narrative   Retired/disabled. Lives at home with her husband. He still works part-time. Currently following social distancing guidelines.    Social Drivers of Corporate investment banker Strain: Low Risk  (12/30/2022)   Overall Financial Resource Strain (CARDIA)    Difficulty of Paying Living Expenses: Not hard at all  Food Insecurity: No Food Insecurity (11/27/2023)   Hunger Vital Sign    Worried About Running Out of Food in the Last Year: Never true    Ran Out of Food in the Last Year: Never true  Transportation Needs: No Transportation Needs (11/27/2023)   PRAPARE - Administrator, Civil Service (Medical): No    Lack of Transportation (Non-Medical): No  Physical Activity: Insufficiently Active (12/30/2022)   Exercise Vital Sign    Days of Exercise per Week: 3 days    Minutes of Exercise per Session: 30 min  Stress: No Stress Concern Present (12/30/2022)   [employer] of Occupational Health - Occupational Stress Questionnaire  Feeling of Stress : Only a little  Social Connections: Unknown (11/24/2023)   Social Connection and Isolation Panel [NHANES]    Frequency of Communication with Friends and Family: Patient unable to answer    Frequency of Social Gatherings with Friends and Family: Patient unable to answer    Attends Religious Services: Not on Marketing executive or Organizations: Not on file    Attends Banker Meetings: Not on file    Marital Status: Not on file   Family History  Problem Relation Age of Onset   Heart attack Mother    Stroke  Mother    Stroke Sister    Diabetes Other        sister x3, and mother   Lung disease Father        worked in Forensic psychologist cancer Neg Hx    Colon polyps Neg Hx         Objective:   Physical Exam Vitals reviewed.  Constitutional:      General: She is not in acute distress.    Appearance: She is well-developed.     Comments: underweight  HENT:     Head: Normocephalic and atraumatic.     Right Ear: Tympanic membrane normal.     Left Ear: Tympanic membrane normal.  Eyes:     Pupils: Pupils are equal, round, and reactive to light.  Neck:     Thyroid : No thyromegaly.  Cardiovascular:     Rate and Rhythm: Normal rate and regular rhythm.     Heart sounds: Normal heart sounds. No murmur heard. Pulmonary:     Effort: Pulmonary effort is normal. No respiratory distress.     Breath sounds: Wheezing and rhonchi present.  Abdominal:     General: Bowel sounds are normal. There is no distension.     Palpations: Abdomen is soft.     Tenderness: There is no abdominal tenderness.  Musculoskeletal:        General: No tenderness.     Cervical back: Normal range of motion and neck supple.     Comments: Pain in lumbar with flexion and extension, walking at 60 degrees  Skin:    General: Skin is warm and dry.  Neurological:     Mental Status: She is alert and oriented to person, place, and time.     Cranial Nerves: No cranial nerve deficit.     Deep Tendon Reflexes: Reflexes are normal and symmetric.  Psychiatric:        Behavior: Behavior normal.        Thought Content: Thought content normal.        Judgment: Judgment normal.       BP (!) 106/59   Pulse 97   Temp (!) 97.4 F (36.3 C) (Temporal)   Ht 5\' 3"  (1.6 m)   Wt 87 lb (39.5 kg)   SpO2 92%   BMI 15.41 kg/m      Assessment & Plan:  Jamie [name] comes in today with chief complaint of Follow-up   Diagnosis and orders addressed:  1. Current smoker - CMP14+EGFR  2. Type 2 diabetes mellitus with diabetic  polyneuropathy, with long-term current use of insulin  (HCC) (Primary) Improved since husband has been giving Lantus  and other medications  Low carb diet - CMP14+EGFR  3. Moderate early onset Alzheimer's dementia with agitation (HCC) Continue medications  - CMP14+EGFR  4. Protein-calorie malnutrition, severe (HCC) High protein diet Start Glucerna  with BID with meals - CMP14+EGFR   Labs pending Continue current medications  Keep follow up with specialists  Health Maintenance reviewed Diet and exercise encouraged  Return in about 2 months (around 04/10/2024), or if symptoms worsen or fail to improve.    Tommas Fragmin, FNP

## 2024-02-09 NOTE — Patient Instructions (Addendum)
 High-Protein and High-Calorie Diet Eating high-protein and high-calorie foods can help you to gain weight, heal after an injury, and get better after an illness or surgery. The amount of daily protein and calories you need depends on: Your body weight. The reason you were told to follow this diet. Usually, a high-protein, high-calorie diet means that you should: Eat 250-500 extra calories each day. Make sure that you get enough of your daily calories from protein. Ask your health care provider how many of your calories should come from protein and how many calories total you need each day. Follow the diet as told by your provider. What are tips for following this plan? Reading food labels Check the nutrition facts label for calories, and grams of fat and protein. Items with more than 4 grams of protein are high-protein foods. General information  Ask your provider if you should take a nutritional supplement. Try to eat six small meals each day instead of three large meals. A goal is usually to eat every 2 to 3 hours. Eat a balanced diet. In each meal, include one food that's high in protein and one food with fat in it. Keep nutritious snacks available, such as nuts, trail mixes, dried fruit, and whole-milk yogurt. If you have kidney disease or diabetes, talk with your provider about how much protein is safe for you. Too much protein may put extra stress on your kidneys. Replace zero-calorie drinks with drinks that have calories in them, such as milk and 100% fruit juice. Consider setting a timer to remind you to eat. You'll want to eat even if you do not feel very hungry. Preparing meals Milk and dairy foods. Add whole milk, half-and-half, or heavy cream to cereal, pudding, soup, or hot cocoa. Add whole milk to instant breakfast drinks. Add powdered milk to baked goods, smoothies, or milkshakes. Add powdered milk, cream, or butter to mashed potatoes. Replace water with milk or heavy cream  when making foods such as oatmeal, pudding, or cocoa. Make cream-based pastas and soups. Add cheese to cooked vegetables. Make whole-milk yogurt parfaits. Top them with granola, fruit, or nuts. Add cottage cheese to fruit. Add cream cheese to sandwiches or as a topping on crackers and bread. Eggs. Add hard-boiled eggs to salads. Keep hard-boiled eggs in the fridge to snack on. Add cheese to cooked eggs. Beans, nuts, and seeds. Add peanut butter to oatmeal or smoothies. Use peanut butter as a dip for fruits and vegetables or as a topping for pretzels, celery, or crackers. Add beans to casseroles, dips, and spreads. Add pureed beans to sauces and soups. Salads, soups, and other foods. Add avocado, cheese, or both to sandwiches or salads. Add avocado to smoothies. Add meat, poultry, or seafood to rice, pasta, casseroles, salads, and soups. Use mayonnaise when making egg salad, chicken salad, or tuna salad. Add oil or butter to cooked vegetables and grains. What high-protein foods should I eat?  Vegetables Soybeans. Peas. Grains Quinoa. Bulgur wheat. Buckwheat. Meats and other proteins Beef, pork, and poultry. Fish and seafood. Eggs. Tofu. Textured vegetable protein (TVP). Peanut butter. Nuts and seeds. Dried beans. Protein powders. Hummus. Jerky. Dairy Whole milk. Whole-milk yogurt. Powdered milk. Cheese. Cottage cheese. Eggnog. Beverages High-protein supplement drinks. Soy milk. Other foods Protein bars. The items listed above may not be all the foods and drinks you can have. Talk with an expert in healthy eating called a dietitian to learn more. What high-calorie foods should I eat? Fruits Dried fruit. Fruit leather. Canned  fruit in syrup. Fruit juice. Avocado. Vegetables Vegetables cooked in oil or butter. Fried potatoes. Grains Pasta. Quick breads. Muffins. Pancakes. Granola. Meats and other proteins Peanut butter and other nut butters. Nuts and seeds. Dairy Heavy cream.  Whipped cream. Cream cheese. Sour cream. Ice cream. Custard. Pudding. Whole-milk dairy products. Beverages Meal-replacement beverages. Nutrition shakes. Fruit juice. Seasonings and condiments Salad dressing. Mayonnaise. Alfredo sauce. Fruit preserves or jelly. Honey. Syrup. Sweets and desserts Cake. Cookies. Pie. Pastries. Candy bars. Chocolate. Fats and oils Butter or margarine. Oil. Gravy. Other foods Meal-replacement bars. The items listed above may not be all the foods and drinks you can have. Talk with an expert in healthy eating to learn more. This information is not intended to replace advice given to you by your health care provider. Make sure you discuss any questions you have with your health care provider. Document Revised: 02/24/2023 Document Reviewed: 02/24/2023 Elsevier Patient Education  2024 [employer].

## 2024-02-10 ENCOUNTER — Other Ambulatory Visit: Payer: Self-pay | Admitting: Family

## 2024-02-10 DIAGNOSIS — E08649 Diabetes mellitus due to underlying condition with hypoglycemia without coma: Secondary | ICD-10-CM

## 2024-02-10 LAB — CMP14+EGFR
ALT: 9 IU/L (ref 0–32)
AST: 13 IU/L (ref 0–40)
Albumin: 3.9 g/dL (ref 3.8–4.8)
Alkaline Phosphatase: 119 IU/L (ref 44–121)
BUN/Creatinine Ratio: 19 (ref 12–28)
BUN: 20 mg/dL (ref 8–27)
Bilirubin Total: <0.2 mg/dL (ref 0.0–1.2)
CO2: 24 mmol/L (ref 20–29)
Calcium: 9 mg/dL (ref 8.7–10.3)
Chloride: 101 mmol/L (ref 96–106)
Creatinine, Ser: 1.08 mg/dL — ABNORMAL HIGH (ref 0.57–1.00)
Globulin, Total: 2.3 g/dL (ref 1.5–4.5)
Glucose: 233 mg/dL — ABNORMAL HIGH (ref 70–99)
Potassium: 3.8 mmol/L (ref 3.5–5.2)
Sodium: 137 mmol/L (ref 134–144)
Total Protein: 6.2 g/dL (ref 6.0–8.5)
eGFR: 55 mL/min/{1.73_m2} — ABNORMAL LOW (ref >59)

## 2024-02-10 MED ORDER — LANTUS SOLOSTAR 100 UNIT/ML ~~LOC~~ SOPN: 18.0000 [IU] | PEN_INJECTOR | Freq: Every day | SUBCUTANEOUS | 1 refills | Status: DC

## 2024-02-11 ENCOUNTER — Other Ambulatory Visit: Payer: Self-pay | Admitting: Nurse Practitioner

## 2024-02-11 DIAGNOSIS — E162 Hypoglycemia, unspecified: Secondary | ICD-10-CM

## 2024-02-18 ENCOUNTER — Encounter: Payer: Self-pay | Admitting: Family Medicine

## 2024-02-23 ENCOUNTER — Ambulatory Visit: Payer: Self-pay

## 2024-02-23 NOTE — Telephone Encounter (Signed)
 Copied from CRM [id number]. Topic: Clinical - Red Word Triage >> Feb 23, 2024  8:38 AM Tisa Forester wrote: Red Word that prompted transfer to Nurse Triage: Patient has a boil on her left shoulder , swelling with little pain  Patient callback number : [phone]   Chief Complaint: Boil on top of left shoulder Symptoms: possible boil Frequency: 2 days ago Pertinent Negatives: Patient denies pain, fever Disposition: [] ED /[] Urgent Care (no appt availability in office) / [x] Appointment(In office/virtual)/ []  [facility]/ [] Home Care/ [] Refused Recommended Disposition /[] [facility]/ []  Follow-up with PCP Additional Notes: Patient's husband called and advised that the patient has a possible boil on her left shoulder.  It is on top of her left shoulder and they noticed it two days ago. Patient's husband states that the patient has dementia and would like an appointment tomorrow after 11 am due to it taking her a little bit of time to get dressed and ready. He states that she has had these before in the past. Patient's husband denies the patient complaining of any pain with this area. Appointment is made for tomorrow 02/24/2024 at 11:20 am with the Doctor of the Day.       Reason for Disposition  Boil > 1/2 inch across (> 12 mm; larger than a marble)  Answer Assessment - Initial Assessment Questions 1. APPEARANCE of BOIL: "What does the boil look like?"      boil 2. LOCATION: "Where is the boil located?"      Top of left shoulder 3. NUMBER: "How many boils are there?"      1  4. SIZE: "How big is the boil?" (e.g., inches, cm; compare to size of a coin or other object)     Size of 50 cent piece 5. ONSET: "When did the boil start?"     2 days ago 6. PAIN: "Is there any pain?" If Yes, ask: "How bad is the pain?"   (Scale 1-10; or mild, moderate, severe)     no 7. FEVER: "Do you have a fever?" If Yes, ask: "What is it, how was it measured, and when did it start?"       no 8. SOURCE: "Have you been around anyone with boils or other Staph infections?" "Have you ever had boils before?"     Unknown    she has had them before 9. OTHER SYMPTOMS: "Do you have any other symptoms?" (e.g., shaking chills, weakness, rash elsewhere on body)     no  Protocols used: Boil (Skin Abscess)-A-AH

## 2024-02-23 NOTE — Telephone Encounter (Signed)
 Appt made

## 2024-02-24 ENCOUNTER — Ambulatory Visit (INDEPENDENT_AMBULATORY_CARE_PROVIDER_SITE_OTHER): Admitting: Family Medicine

## 2024-02-24 ENCOUNTER — Encounter: Payer: Self-pay | Admitting: Family Medicine

## 2024-02-24 VITALS — BP 160/74 | HR 75 | Temp 97.9°F | Ht 63.0 in | Wt 85.4 lb

## 2024-02-24 DIAGNOSIS — L723 Sebaceous cyst: Secondary | ICD-10-CM | POA: Diagnosis not present

## 2024-02-24 DIAGNOSIS — L720 Epidermal cyst: Secondary | ICD-10-CM

## 2024-02-24 DIAGNOSIS — L089 Local infection of the skin and subcutaneous tissue, unspecified: Secondary | ICD-10-CM | POA: Diagnosis not present

## 2024-02-24 MED ORDER — AMOXICILLIN-POT CLAVULANATE 875-125 MG PO TABS: 1.0000 | ORAL_TABLET | Freq: Two times a day (BID) | ORAL | 0 refills | Status: DC

## 2024-02-24 NOTE — Progress Notes (Signed)
 Corrected

## 2024-02-24 NOTE — Progress Notes (Signed)
 Chief Complaint  Patient presents with   Recurrent Skin Infections    Boil on left shoulder. Husband noticed Saturday but might have been there earlier. Still draining. No pain.    HPI  Patient presents today for draining boil present for at least 3 days possibly longer.  Patient is unclear.  Most of the history is given by her husband.  There has been no fever no chills no sweats.  It is painful.  Ruptured sebaceous cyst  PMH: Smoking status noted Review of Systems  Objective: BP (!) 160/74   Pulse 75   Temp 97.9 F (36.6 C)   Ht 5\' 3"  (1.6 m)   Wt 85 lb 6.4 oz (38.7 kg)   SpO2 94%   BMI 15.13 kg/m  Gen: NAD, alert, cooperative with exam HEENT: NCAT, EOMI, PERRL CV: RRR, good S1/S2, no murmur Resp: CTABL, no wheezes, non-labored Skin: 8 mm central lesion with surrounding 3 cm of erythema.  This is slightly raised.  It is oozing sebum mixed with pus.  It is located at the posterior left shoulder at the superior and lateral aspect of the shoulder blade region. Ext: No edema, warm Neuro: Alert and oriented, No gross deficits  Ruptured sebaceous cyst -     Amoxicillin -Pot Clavulanate; Take 1 tablet by mouth 2 (two) times daily. Take all of this medication  Dispense: 20 tablet; Refill: 0  Infected sebaceous cyst of skin -     Amoxicillin -Pot Clavulanate; Take 1 tablet by mouth 2 (two) times daily. Take all of this medication  Dispense: 20 tablet; Refill: 0   Since the cyst is already ruptured and is freely draining no procedure was performed other than cleaning it with sterile water and applying a dressing.  Wound care was reviewed.

## 2024-03-29 ENCOUNTER — Other Ambulatory Visit: Payer: Self-pay | Admitting: Family

## 2024-03-29 DIAGNOSIS — I10 Essential (primary) hypertension: Secondary | ICD-10-CM

## 2024-04-12 ENCOUNTER — Encounter: Payer: Self-pay | Admitting: Family

## 2024-04-12 ENCOUNTER — Ambulatory Visit: Admitting: Family

## 2024-04-12 VITALS — BP 137/77 | HR 69 | Temp 97.6°F | Ht 63.0 in | Wt 85.2 lb

## 2024-04-12 DIAGNOSIS — I10 Essential (primary) hypertension: Secondary | ICD-10-CM | POA: Diagnosis not present

## 2024-04-12 DIAGNOSIS — E1159 Type 2 diabetes mellitus with other circulatory complications: Secondary | ICD-10-CM | POA: Diagnosis not present

## 2024-04-12 DIAGNOSIS — F17200 Nicotine dependence, unspecified, uncomplicated: Secondary | ICD-10-CM

## 2024-04-12 DIAGNOSIS — R636 Underweight: Secondary | ICD-10-CM | POA: Diagnosis not present

## 2024-04-12 DIAGNOSIS — E1169 Type 2 diabetes mellitus with other specified complication: Secondary | ICD-10-CM

## 2024-04-12 DIAGNOSIS — Z794 Long term (current) use of insulin: Secondary | ICD-10-CM

## 2024-04-12 DIAGNOSIS — M5441 Lumbago with sciatica, right side: Secondary | ICD-10-CM

## 2024-04-12 DIAGNOSIS — E785 Hyperlipidemia, unspecified: Secondary | ICD-10-CM

## 2024-04-12 DIAGNOSIS — J410 Simple chronic bronchitis: Secondary | ICD-10-CM | POA: Diagnosis not present

## 2024-04-12 DIAGNOSIS — K219 Gastro-esophageal reflux disease without esophagitis: Secondary | ICD-10-CM

## 2024-04-12 DIAGNOSIS — G8929 Other chronic pain: Secondary | ICD-10-CM

## 2024-04-12 DIAGNOSIS — G300 Alzheimer's disease with early onset: Secondary | ICD-10-CM

## 2024-04-12 DIAGNOSIS — H9190 Unspecified hearing loss, unspecified ear: Secondary | ICD-10-CM | POA: Diagnosis not present

## 2024-04-12 DIAGNOSIS — Z716 Tobacco abuse counseling: Secondary | ICD-10-CM

## 2024-04-12 DIAGNOSIS — F02B11 Dementia in other diseases classified elsewhere, moderate, with agitation: Secondary | ICD-10-CM

## 2024-04-12 NOTE — Patient Instructions (Signed)

## 2024-04-12 NOTE — Progress Notes (Signed)
 Subjective:    Patient ID: Jamie [name], female    DOB: 05/27/1951, 73 y.o.   MRN: [id number]  Chief Complaint  Patient presents with   Medical Management of Chronic Issues   PT presents to the office today for chronic follow up on DM.   Pt had been having hypoglycemia episodes of 34-40. She has memory changes and was giving herself her insulin . We decreased her Lantus  15 units.   PT had surgical decompression and stabilization from L2-L4 on 02/05/17. Pt states her back continues to hurt and her pain is a 8 out 10 when she walks or standing.    She has CAD and takes statin daily.   She has COPD and has SOB and wheezing. She continues to smoke less than 1/2 pack a day. Uses Trelegy daily.    Pt has dementia and taking Aricept  10 mg and Namenda  10 mg.   She was hospitalized 11/23/23-11/25/23 after a fall and hypoglycemia.  Back Pain This is a chronic problem. The current episode started more than 1 year ago. The problem occurs intermittently. The problem is unchanged. The pain is present in the lumbar spine. The quality of the pain is described as aching. The pain is at a severity of 9/10. The pain is moderate. Associated symptoms include leg pain. She has tried analgesics for the symptoms.  Diabetes She presents for her follow-up diabetic visit. She has type 2 diabetes mellitus. Hypoglycemia symptoms include confusion. Pertinent negatives for diabetes include no blurred vision and no foot paresthesias. Risk factors for coronary artery disease include dyslipidemia, diabetes mellitus, hypertension and sedentary lifestyle. She is following a generally healthy diet. Her overall blood glucose range is 130-140 mg/dl.  Hypertension This is a chronic problem. The current episode started more than 1 year ago. The problem has been waxing and waning since onset. The problem is uncontrolled. Associated symptoms include malaise/fatigue and shortness of breath. Pertinent negatives include no blurred  vision or peripheral edema. Risk factors for coronary artery disease include dyslipidemia, diabetes mellitus, obesity and sedentary lifestyle. The current treatment provides moderate improvement.  Nicotine Dependence Presents for follow-up visit. She smokes < 1/2 a pack of cigarettes per day.      Review of Systems  Constitutional:  Positive for malaise/fatigue.  Eyes:  Negative for blurred vision.  Respiratory:  Positive for shortness of breath.   Musculoskeletal:  Positive for back pain.  Psychiatric/Behavioral:  Positive for confusion.   All other systems reviewed and are negative.  Family History  Problem Relation Age of Onset   Heart attack Mother    Stroke Mother    Stroke Sister    Diabetes Other        sister x3, and mother   Lung disease Father        worked in Forensic psychologist cancer Neg Hx    Colon polyps Neg Hx    Social History   Socioeconomic History   Marital status: Married    Spouse name: Christopher   Number of children: 1   Years of education: 12   Highest education level: High school graduate  Occupational History   Occupation: disability  Tobacco Use   Smoking status: Every Day    Current packs/day: 1.00    Average packs/day: 1 pack/day for 56.7 years (56.7 ttl pk-yrs)    Types: Cigarettes    Start date: 08/11/1967   Smokeless tobacco: Never  Vaping Use   Vaping status: Never Used  Substance and Sexual Activity   Alcohol use: No   Drug use: No   Sexual activity: Not Currently    Birth control/protection: None  Other Topics Concern   Not on file  Social History Narrative   Retired/disabled. Lives at home with her husband. He still works part-time. Currently following social distancing guidelines.    Social Drivers of Corporate investment banker Strain: Low Risk  (12/30/2022)   Overall Financial Resource Strain (CARDIA)    Difficulty of Paying Living Expenses: Not hard at all  Food Insecurity: No Food Insecurity (11/27/2023)   Hunger Vital  Sign    Worried About Running Out of Food in the Last Year: Never true    Ran Out of Food in the Last Year: Never true  Transportation Needs: No Transportation Needs (11/27/2023)   PRAPARE - Administrator, Civil Service (Medical): No    Lack of Transportation (Non-Medical): No  Physical Activity: Insufficiently Active (12/30/2022)   Exercise Vital Sign    Days of Exercise per Week: 3 days    Minutes of Exercise per Session: 30 min  Stress: No Stress Concern Present (12/30/2022)   [employer] of Occupational Health - Occupational Stress Questionnaire    Feeling of Stress : Only a little  Social Connections: Unknown (11/24/2023)   Social Connection and Isolation Panel    Frequency of Communication with Friends and Family: Patient unable to answer    Frequency of Social Gatherings with Friends and Family: Patient unable to answer    Attends Religious Services: Not on Marketing executive or Organizations: Not on file    Attends Banker Meetings: Not on file    Marital Status: Not on file        Objective:   Physical Exam Vitals reviewed.  Constitutional:      General: She is not in acute distress.    Appearance: She is well-developed.  HENT:     Head: Normocephalic and atraumatic.     Right Ear: There is impacted cerumen.     Left Ear: There is impacted cerumen.     Ears:     Comments: HOH   Eyes:     Pupils: Pupils are equal, round, and reactive to light.   Neck:     Thyroid : No thyromegaly.   Cardiovascular:     Rate and Rhythm: Regular rhythm.     Heart sounds: Normal heart sounds. No murmur heard. Pulmonary:     Effort: Pulmonary effort is normal. No respiratory distress.     Breath sounds: Wheezing present.  Abdominal:     General: Bowel sounds are normal. There is no distension.     Palpations: Abdomen is soft.     Tenderness: There is no abdominal tenderness.   Musculoskeletal:        General: Tenderness present.      Cervical back: Normal range of motion and neck supple.     Comments: Pain in lumbar, walking at 80 degrees    Skin:    General: Skin is warm and dry.     Coloration: Skin is not pale.   Neurological:     Mental Status: She is alert and oriented to person, place, and time.     Cranial Nerves: No cranial nerve deficit.     Deep Tendon Reflexes: Reflexes are normal and symmetric.   Psychiatric:        Behavior: Behavior normal.  Thought Content: Thought content normal.        Judgment: Judgment normal.      BP 137/77   Pulse 69   Temp 97.6 F (36.4 C) (Temporal)   Ht 5' 3 (1.6 m)   Wt 85 lb 3.2 oz (38.6 kg)   BMI 15.09 kg/m      Assessment & Plan:   NOELLA KIPNIS comes in today with chief complaint of Medical Management of Chronic Issues   Diagnosis and orders addressed:  1. Simple chronic bronchitis (HCC) (Primary) - CMP14+EGFR  2. Current smoker - CMP14+EGFR  3. Hyperlipidemia associated with type 2 diabetes mellitus (HCC) - CMP14+EGFR  4. Hard of hearing - CMP14+EGFR  5. Gastroesophageal reflux disease without esophagitis - CMP14+EGFR  6. Underweight  - CMP14+EGFR  7. Chronic bilateral low back pain with right-sided sciatica - CMP14+EGFR  8. DM type 2 causing vascular disease (HCC) - Bayer DCA Hb A1c Waived - CMP14+EGFR  9. Tobacco abuse counseling - CMP14+EGFR  10. Essential hypertension, benign  - CMP14+EGFR  11. Moderate early onset Alzheimer's dementia with agitation (HCC) - CMP14+EGFR   Labs pending Continue Lantus  15 units  Low carb Continue current medications  Health Maintenance reviewed Diet and exercise encouraged  Follow up plan: 3 months   Bari Learn, FNP

## 2024-04-19 ENCOUNTER — Other Ambulatory Visit

## 2024-06-23 ENCOUNTER — Other Ambulatory Visit: Payer: Self-pay | Admitting: Family

## 2024-06-23 DIAGNOSIS — I10 Essential (primary) hypertension: Secondary | ICD-10-CM

## 2024-06-29 ENCOUNTER — Other Ambulatory Visit: Payer: Self-pay | Admitting: Family

## 2024-06-29 DIAGNOSIS — G300 Alzheimer's disease with early onset: Secondary | ICD-10-CM

## 2024-06-29 NOTE — Telephone Encounter (Unsigned)
 Copied from CRM [id number]. Topic: Clinical - Medication Refill >> Jun 29, 2024  3:30 PM Nathanel BROCKS wrote: Medication: donepezil  (ARICEPT ) 10 MG tablet  Has the patient contacted their pharmacy? Yes   This is the patient's preferred pharmacy:  [facility] - [place], [place] - [address] [place] [address] Phone: [phone] Fax: [phone]  Is this the correct pharmacy for this prescription? Yes If no, delete pharmacy and type the correct one.   Has the prescription been filled recently? No  Is the patient out of the medication? Yes  Has the patient been seen for an appointment in the last year OR does the patient have an upcoming appointment? Yes  Can we respond through MyChart? Yes  Agent: Please be advised that Rx refills may take up to 3 business days. We ask that you follow-up with your pharmacy.

## 2024-07-01 MED ORDER — DONEPEZIL HCL 10 MG PO TABS: 10.0000 mg | ORAL_TABLET | Freq: Every day | ORAL | 1 refills | Status: DC

## 2024-07-02 ENCOUNTER — Other Ambulatory Visit: Payer: Self-pay

## 2024-07-02 DIAGNOSIS — F02B11 Dementia in other diseases classified elsewhere, moderate, with agitation: Secondary | ICD-10-CM

## 2024-07-02 MED ORDER — DONEPEZIL HCL 10 MG PO TABS: 10.0000 mg | ORAL_TABLET | Freq: Every day | ORAL | 1 refills | Status: AC

## 2024-07-02 NOTE — Addendum Note (Signed)
 Addended by: MICHELINE KNEE F on: 07/02/2024 01:01 PM   Modules accepted: Orders

## 2024-07-02 NOTE — Telephone Encounter (Signed)
 Primus Speaker A   07/02/2024 12:19 PM  Pt husband called stating [facility] informed him that they will be cancelling the rx due to it not having any refills on it. Rx donepezil  (ARICEPT ) 10 MG tablet Please advise     Copied from CRM #[id number]. Topic: Clinical - Medication Refill >> Jun 29, 2024  3:30 PM Nathanel BROCKS wrote: Medication: donepezil  (ARICEPT ) 10 MG tablet  Has the patient contacted their pharmacy? Yes   This is the patient's preferred pharmacy:  [facility] - [place], [place] - [address] [place] [address] Phone: [phone] Fax: [phone]  Is this the correct pharmacy for this prescription? Yes If no, delete pharmacy and type the correct one.   Has the prescription been filled recently? No  Is the patient out of the medication? Yes  Has the patient been seen for an appointment in the last year OR does the patient have an upcoming appointment? Yes  Can we respond through MyChart? Yes  Agent: Please be advised that Rx refills may take up to 3 business days. We ask that you follow-up with your pharmacy. >> Jul 02, 2024 12:19 PM Mia F wrote: Pt husband called stating [facility] informed him that they will be cancelling the rx due to it not having any refills on it. Rx donepezil  (ARICEPT ) 10 MG tablet Please advise

## 2024-07-13 ENCOUNTER — Encounter: Payer: Self-pay | Admitting: Family

## 2024-07-13 ENCOUNTER — Ambulatory Visit: Admitting: Family

## 2024-07-13 VITALS — BP 120/67 | HR 89 | Temp 97.4°F | Ht 61.0 in | Wt 88.0 lb

## 2024-07-13 DIAGNOSIS — Z23 Encounter for immunization: Secondary | ICD-10-CM | POA: Diagnosis not present

## 2024-07-13 DIAGNOSIS — E1159 Type 2 diabetes mellitus with other circulatory complications: Secondary | ICD-10-CM

## 2024-07-13 DIAGNOSIS — J410 Simple chronic bronchitis: Secondary | ICD-10-CM

## 2024-07-13 DIAGNOSIS — F17200 Nicotine dependence, unspecified, uncomplicated: Secondary | ICD-10-CM

## 2024-07-13 DIAGNOSIS — E08649 Diabetes mellitus due to underlying condition with hypoglycemia without coma: Secondary | ICD-10-CM

## 2024-07-13 DIAGNOSIS — E162 Hypoglycemia, unspecified: Secondary | ICD-10-CM

## 2024-07-13 DIAGNOSIS — E559 Vitamin D deficiency, unspecified: Secondary | ICD-10-CM | POA: Diagnosis not present

## 2024-07-13 DIAGNOSIS — K219 Gastro-esophageal reflux disease without esophagitis: Secondary | ICD-10-CM

## 2024-07-13 DIAGNOSIS — Z794 Long term (current) use of insulin: Secondary | ICD-10-CM

## 2024-07-13 DIAGNOSIS — F02B11 Dementia in other diseases classified elsewhere, moderate, with agitation: Secondary | ICD-10-CM

## 2024-07-13 DIAGNOSIS — G300 Alzheimer's disease with early onset: Secondary | ICD-10-CM

## 2024-07-13 DIAGNOSIS — E1169 Type 2 diabetes mellitus with other specified complication: Secondary | ICD-10-CM

## 2024-07-13 DIAGNOSIS — I10 Essential (primary) hypertension: Secondary | ICD-10-CM | POA: Diagnosis not present

## 2024-07-13 DIAGNOSIS — Z716 Tobacco abuse counseling: Secondary | ICD-10-CM

## 2024-07-13 LAB — BAYER DCA HB A1C WAIVED: HB A1C (BAYER DCA - WAIVED): 7.7 % — ABNORMAL HIGH (ref 4.8–5.6)

## 2024-07-13 MED ORDER — LOSARTAN POTASSIUM 100 MG PO TABS: 100.0000 mg | ORAL_TABLET | Freq: Every day | ORAL | 0 refills | Status: AC

## 2024-07-13 MED ORDER — LANTUS SOLOSTAR 100 UNIT/ML ~~LOC~~ SOPN: 18.0000 [IU] | PEN_INJECTOR | Freq: Every day | SUBCUTANEOUS | 1 refills | Status: AC

## 2024-07-13 MED ORDER — PRAVASTATIN SODIUM 40 MG PO TABS: 40.0000 mg | ORAL_TABLET | Freq: Every day | ORAL | 0 refills | Status: AC

## 2024-07-13 MED ORDER — ASPIRIN EC 81 MG PO TBEC: 81.0000 mg | DELAYED_RELEASE_TABLET | Freq: Every day | ORAL | 1 refills | Status: AC

## 2024-07-13 MED ORDER — MEMANTINE HCL 10 MG PO TABS: 10.0000 mg | ORAL_TABLET | Freq: Two times a day (BID) | ORAL | 1 refills | Status: AC

## 2024-07-13 MED ORDER — ALBUTEROL SULFATE (2.5 MG/3ML) 0.083% IN NEBU: 2.5000 mg | INHALATION_SOLUTION | Freq: Four times a day (QID) | RESPIRATORY_TRACT | 1 refills | Status: AC | PRN

## 2024-07-13 MED ORDER — AMLODIPINE BESYLATE 10 MG PO TABS: 10.0000 mg | ORAL_TABLET | Freq: Every day | ORAL | 0 refills | Status: AC

## 2024-07-13 MED ORDER — GLUCOSE INSTANT ENERGY 4-6 GM-MG PO CHEW: 1.0000 | CHEWABLE_TABLET | ORAL | 0 refills | Status: AC | PRN

## 2024-07-13 NOTE — Progress Notes (Signed)
 Subjective:    Patient ID: Jamie [name], female    DOB: 07-Dec-1950, 73 y.o.   MRN: [id number]  Chief Complaint  Patient presents with   Medical Management of Chronic Issues   PT presents to the office today for chronic follow up on DM.   Pt had been having hypoglycemia episodes of 34-40. She has memory changes and was giving herself her insulin . We decreased her Lantus  15 units.   PT had surgical decompression and stabilization from L2-L4 on 02/05/17. Pt states her back continues to hurt and her pain is a 8 out 10 when she walks or standing.    She has CAD and takes statin daily.   She has COPD and has SOB and wheezing. She continues to smoke less than 1/2 pack a day. Uses Trelegy daily.    Pt has dementia and taking Aricept  10 mg and Namenda  10 mg. Husband takes care of her and states her memory is worsening.  Back Pain This is a chronic problem. The current episode started more than 1 year ago. The problem occurs intermittently. The problem is unchanged. The pain is present in the lumbar spine. The quality of the pain is described as aching. The pain is at a severity of 9/10. The pain is moderate. Associated symptoms include leg pain. She has tried analgesics for the symptoms.  Diabetes She presents for her follow-up diabetic visit. She has type 2 diabetes mellitus. Hypoglycemia symptoms include confusion. Pertinent negatives for diabetes include no blurred vision and no foot paresthesias. Risk factors for coronary artery disease include dyslipidemia, diabetes mellitus, hypertension and sedentary lifestyle. She is following a generally healthy diet. Her overall blood glucose range is 110-130 mg/dl.  Hypertension This is a chronic problem. The current episode started more than 1 year ago. The problem has been waxing and waning since onset. The problem is uncontrolled. Associated symptoms include malaise/fatigue and shortness of breath. Pertinent negatives include no blurred vision or  peripheral edema. Risk factors for coronary artery disease include dyslipidemia, diabetes mellitus, obesity and sedentary lifestyle. The current treatment provides moderate improvement.  Nicotine Dependence Presents for follow-up visit. She smokes < 1/2 a pack of cigarettes per day.      Review of Systems  Constitutional:  Positive for malaise/fatigue.  Eyes:  Negative for blurred vision.  Respiratory:  Positive for shortness of breath.   Musculoskeletal:  Positive for back pain.  Psychiatric/Behavioral:  Positive for confusion.   All other systems reviewed and are negative.  Family History  Problem Relation Age of Onset   Heart attack Mother    Stroke Mother    Stroke Sister    Diabetes Other        sister x3, and mother   Lung disease Father        worked in Forensic psychologist cancer Neg Hx    Colon polyps Neg Hx    Social History   Socioeconomic History   Marital status: Married    Spouse name: Christopher   Number of children: 1   Years of education: 12   Highest education level: High school graduate  Occupational History   Occupation: disability  Tobacco Use   Smoking status: Every Day    Current packs/day: 1.00    Average packs/day: 1 pack/day for 56.9 years (56.9 ttl pk-yrs)    Types: Cigarettes    Start date: 08/11/1967   Smokeless tobacco: Never  Vaping Use   Vaping status: Never Used  Substance and Sexual Activity   Alcohol use: No   Drug use: No   Sexual activity: Not Currently    Birth control/protection: None  Other Topics Concern   Not on file  Social History Narrative   Retired/disabled. Lives at home with her husband. He still works part-time. Currently following social distancing guidelines.    Social Drivers of Corporate investment banker Strain: Low Risk  (12/30/2022)   Overall Financial Resource Strain (CARDIA)    Difficulty of Paying Living Expenses: Not hard at all  Food Insecurity: No Food Insecurity (11/27/2023)   Hunger Vital Sign     Worried About Running Out of Food in the Last Year: Never true    Ran Out of Food in the Last Year: Never true  Transportation Needs: No Transportation Needs (11/27/2023)   PRAPARE - Administrator, Civil Service (Medical): No    Lack of Transportation (Non-Medical): No  Physical Activity: Insufficiently Active (12/30/2022)   Exercise Vital Sign    Days of Exercise per Week: 3 days    Minutes of Exercise per Session: 30 min  Stress: No Stress Concern Present (12/30/2022)   [employer] of Occupational Health - Occupational Stress Questionnaire    Feeling of Stress : Only a little  Social Connections: Unknown (11/24/2023)   Social Connection and Isolation Panel    Frequency of Communication with Friends and Family: Patient unable to answer    Frequency of Social Gatherings with Friends and Family: Patient unable to answer    Attends Religious Services: Not on Marketing executive or Organizations: Not on file    Attends Banker Meetings: Not on file    Marital Status: Not on file        Objective:   Physical Exam Vitals reviewed.  Constitutional:      General: She is not in acute distress.    Appearance: She is well-developed.  HENT:     Head: Normocephalic and atraumatic.     Right Ear: There is impacted cerumen.     Left Ear: There is impacted cerumen.     Ears:     Comments: HOH  Eyes:     Pupils: Pupils are equal, round, and reactive to light.  Neck:     Thyroid : No thyromegaly.  Cardiovascular:     Rate and Rhythm: Regular rhythm.     Heart sounds: Normal heart sounds. No murmur heard. Pulmonary:     Effort: Pulmonary effort is normal. No respiratory distress.     Breath sounds: Wheezing present.  Abdominal:     General: Bowel sounds are normal. There is no distension.     Palpations: Abdomen is soft.     Tenderness: There is no abdominal tenderness.  Musculoskeletal:        General: Tenderness present.     Cervical back:  Normal range of motion and neck supple.     Comments: Pain in lumbar, walking at 80 degrees   Skin:    General: Skin is warm and dry.     Coloration: Skin is not pale.  Neurological:     Mental Status: She is alert and oriented to person, place, and time.     Cranial Nerves: No cranial nerve deficit.     Deep Tendon Reflexes: Reflexes are normal and symmetric.  Psychiatric:        Behavior: Behavior normal.        Thought Content: Thought content  normal.        Judgment: Judgment normal.      BP (!) 140/79   Pulse 89   Temp (!) 97.4 F (36.3 C)   Ht 5' 1 (1.549 m)   Wt 88 lb (39.9 kg)   BMI 16.63 kg/m      Assessment & Plan:   MATSUKO KRETZ comes in today with chief complaint of Medical Management of Chronic Issues   Diagnosis and orders addressed:  1. Hypoglycemia - glucose-Vitamin C (GLUCOSE INSTANT ENERGY) 4-0.006 GM CHEW chewable tablet; Chew 1 tablet by mouth as needed for low blood sugar.  Dispense: 20 tablet; Refill: 0 - CMP14+EGFR - CBC with Differential/Platelet  2. Diabetes mellitus due to underlying condition with hypoglycemia without coma, with long-term current use of insulin  (HCC)  - insulin  glargine (LANTUS  SOLOSTAR) 100 UNIT/ML Solostar Pen; Inject 18 Units into the skin at bedtime.  Dispense: 30 mL; Refill: 1 - Bayer DCA Hb A1c Waived - CMP14+EGFR - CBC with Differential/Platelet  3. Essential hypertension - amLODipine  (NORVASC ) 10 MG tablet; Take 1 tablet (10 mg total) by mouth daily.  Dispense: 90 tablet; Refill: 0 - losartan  (COZAAR ) 100 MG tablet; Take 1 tablet (100 mg total) by mouth daily.  Dispense: 90 tablet; Refill: 0 - Bayer DCA Hb A1c Waived - CMP14+EGFR - CBC with Differential/Platelet  4. Moderate early onset Alzheimer's dementia with agitation (HCC) - memantine  (NAMENDA ) 10 MG tablet; Take 1 tablet (10 mg total) by mouth 2 (two) times daily.  Dispense: 180 tablet; Refill: 1 - CMP14+EGFR - CBC with Differential/Platelet  5.  Encounter for immunization (Primary)  - Flu vaccine HIGH DOSE PF(Fluzone Trivalent)  6. Simple chronic bronchitis (HCC) - CMP14+EGFR - CBC with Differential/Platelet - For home use only DME Nebulizer machine - albuterol  (PROVENTIL ) (2.5 MG/3ML) 0.083% nebulizer solution; Take 3 mLs (2.5 mg total) by nebulization every 6 (six) hours as needed for wheezing or shortness of breath.  Dispense: 150 mL; Refill: 1  7. Current smoker  - CMP14+EGFR - CBC with Differential/Platelet  8. Hyperlipidemia associated with type 2 diabetes mellitus (HCC) - pravastatin  (PRAVACHOL ) 40 MG tablet; Take 1 tablet (40 mg total) by mouth daily.  Dispense: 90 tablet; Refill: 0 - CMP14+EGFR - CBC with Differential/Platelet  9. Vitamin D  deficiency - CMP14+EGFR - CBC with Differential/Platelet  10. Gastroesophageal reflux disease without esophagitis  - CMP14+EGFR - CBC with Differential/Platelet  11. DM type 2 causing vascular disease (HCC) - CMP14+EGFR - CBC with Differential/Platelet  12. Tobacco abuse counseling  - CMP14+EGFR - CBC with Differential/Platelet   Coarse rhonchi, nebulizer machine and albuterol  to use Labs pending Low carb Continue current medications  Health Maintenance reviewed Diet and exercise encouraged  Follow up plan: 3 months   Bari Learn, FNP

## 2024-07-13 NOTE — Addendum Note (Signed)
 Addended by: LAVELL LYE A on: 07/13/2024 03:16 PM   Modules accepted: Level of Service

## 2024-07-13 NOTE — Patient Instructions (Signed)
 Chronic Obstructive Pulmonary Disease Exacerbation  Chronic obstructive pulmonary disease (COPD) is a long-term (chronic) lung problem. When you have COPD, it can feel harder to breathe in or out. COPD exacerbation is a flare-up of symptoms when breathing gets worse and more treatment may be needed. Without treatment, flare-ups can be life-threatening. If they happen often, your lungs can become more damaged. What are the causes? Not taking your usual COPD medicines as told by your health care provider. A cold or the flu, which can cause infection in your lungs. Being exposed to things that make your breathing worse, such as: Smoke. Air pollution. Fumes. Dust. Allergies. Weather changes. What are the signs or symptoms? Symptoms do not get better or get worse even if you take your medicines as told by your provider. Symptoms may include: More shortness of breath. You may only be able to speak one or two words at a time. More coughing or mucus from your lungs. More wheezing or chest tightness. Being more tired and having less energy. Confusion. How is this diagnosed? This condition is diagnosed based on: Symptoms that get worse. Your medical history. A physical exam. You may also have tests, including: A chest X-ray. Blood or mucus tests. How is this treated? You may be able to stay home or you may need to go to the hospital. Treatment may include: Taking medicines. These may include: Inhalers. These have medicines in them that you breathe in. These may be more of what you already take or they may be new. Steroids. These reduce inflammation in the airways. These may be inhaled, taken by mouth, or given in an IV. Antibiotics. These treat infection. Using oxygen. Using a device to help you clear mucus. Follow these instructions at home: Medicines Take your medicines only as told by your provider. If you were given antibiotics or steroids, take them as told by your provider. Do  not stop taking them even if you start to feel better. Lifestyle Several times a day, wash your hands with soap and water for at least 20 seconds. If you cannot use soap and water, use hand sanitizer. This may help keep you from getting an infection. Avoid being around crowds or people who are sick. Do not smoke or use any products that contain nicotine or tobacco. If you need help quitting, ask your provider. Return to your normal activities when your provider says that it's safe. Use breathing methods to control your stress and catch your breath. How is this prevented? Follow your COPD action plan. The action plan tells you what to do if you're feeling good and what to do when you start feeling worse. Discuss the plan often with your provider. Make sure you get all the shots, also called vaccines, that your provider recommends. Ask your provider about a flu shot and a pneumonia shot. Use oxygen therapy if told by your provider. If you need home oxygen therapy, ask your provider how often to check your oxygen level with a device called an oximeter. Keep all follow-up visits to review your COPD action plan. Your provider will want to check on your condition often to keep you healthy and out of the hospital. Contact a health care provider if: Your COPD symptoms get worse. You have a fever or chills. You have trouble doing daily activities. You have trouble breathing even when you are resting. Get help right away if: You are short of breath and cannot: Talk in full sentences. Do normal activities. You have chest  pain. You feel confused. These symptoms may be an emergency. Call 911 right away. Do not wait to see if the symptoms will go away. Do not drive yourself to the hospital. This information is not intended to replace advice given to you by your health care provider. Make sure you discuss any questions you have with your health care provider. Document Revised: 07/03/2023 Document  Reviewed: 12/16/2022 Elsevier Patient Education  2024 [employer].

## 2024-07-14 LAB — CMP14+EGFR
ALT: 11 IU/L (ref 0–32)
AST: 14 IU/L (ref 0–40)
Albumin: 4.3 g/dL (ref 3.8–4.8)
Alkaline Phosphatase: 102 IU/L (ref 49–135)
BUN/Creatinine Ratio: 23 (ref 12–28)
BUN: 25 mg/dL (ref 8–27)
Bilirubin Total: 0.2 mg/dL (ref 0.0–1.2)
CO2: 24 mmol/L (ref 20–29)
Calcium: 9.6 mg/dL (ref 8.7–10.3)
Chloride: 100 mmol/L (ref 96–106)
Creatinine, Ser: 1.07 mg/dL — AB (ref 0.57–1.00)
Globulin, Total: 2.5 g/dL (ref 1.5–4.5)
Glucose: 189 mg/dL — AB (ref 70–99)
Potassium: 4.3 mmol/L (ref 3.5–5.2)
Sodium: 136 mmol/L (ref 134–144)
Total Protein: 6.8 g/dL (ref 6.0–8.5)
eGFR: 56 mL/min/1.73 — AB (ref >59)

## 2024-07-14 LAB — CBC WITH DIFFERENTIAL/PLATELET
Basophils Absolute: 0 x10E3/uL (ref 0.0–0.2)
Basos: 1 %
EOS (ABSOLUTE): 0.1 x10E3/uL (ref 0.0–0.4)
Eos: 3 %
Hematocrit: 41.7 % (ref 34.0–46.6)
Hemoglobin: 13.5 g/dL (ref 11.1–15.9)
Immature Grans (Abs): 0 x10E3/uL (ref 0.0–0.1)
Immature Granulocytes: 0 %
Lymphocytes Absolute: 1.5 x10E3/uL (ref 0.7–3.1)
Lymphs: 27 %
MCH: 27.8 pg (ref 26.6–33.0)
MCHC: 32.4 g/dL (ref 31.5–35.7)
MCV: 86 fL (ref 79–97)
Monocytes Absolute: 0.4 x10E3/uL (ref 0.1–0.9)
Monocytes: 7 %
Neutrophils Absolute: 3.4 x10E3/uL (ref 1.4–7.0)
Neutrophils: 62 %
Platelets: 214 x10E3/uL (ref 150–450)
RBC: 4.85 x10E6/uL (ref 3.77–5.28)
RDW: 12.8 % (ref 11.7–15.4)
WBC: 5.4 x10E3/uL (ref 3.4–10.8)

## 2024-07-15 ENCOUNTER — Ambulatory Visit: Payer: Self-pay | Admitting: Family

## 2024-08-04 ENCOUNTER — Encounter: Payer: Self-pay | Admitting: *Deleted

## 2024-10-04 ENCOUNTER — Encounter: Payer: Self-pay | Admitting: Family

## 2024-10-04 ENCOUNTER — Ambulatory Visit: Payer: Self-pay | Admitting: Family

## 2024-10-12 ENCOUNTER — Ambulatory Visit: Admitting: Family

## 2024-10-20 ENCOUNTER — Inpatient Hospital Stay (HOSPITAL_COMMUNITY)
Admission: EM | Admit: 2024-10-20 | Discharge: 2024-11-01 | DRG: 193 | Disposition: A | Discharge status: Skilled Nursing Facility | Attending: Internal Medicine | Admitting: Internal Medicine

## 2024-10-20 ENCOUNTER — Emergency Department (HOSPITAL_COMMUNITY)

## 2024-10-20 ENCOUNTER — Other Ambulatory Visit: Payer: Self-pay

## 2024-10-20 DIAGNOSIS — Z9841 Cataract extraction status, right eye: Secondary | ICD-10-CM

## 2024-10-20 DIAGNOSIS — F0283 Dementia in other diseases classified elsewhere, unspecified severity, with mood disturbance: Secondary | ICD-10-CM | POA: Diagnosis present

## 2024-10-20 DIAGNOSIS — E872 Acidosis, unspecified: Secondary | ICD-10-CM | POA: Diagnosis present

## 2024-10-20 DIAGNOSIS — G9341 Metabolic encephalopathy: Secondary | ICD-10-CM | POA: Diagnosis present

## 2024-10-20 DIAGNOSIS — E871 Hypo-osmolality and hyponatremia: Secondary | ICD-10-CM | POA: Diagnosis present

## 2024-10-20 DIAGNOSIS — Z8249 Family history of ischemic heart disease and other diseases of the circulatory system: Secondary | ICD-10-CM

## 2024-10-20 DIAGNOSIS — J188 Other pneumonia, unspecified organism: Principal | ICD-10-CM | POA: Diagnosis present

## 2024-10-20 DIAGNOSIS — E43 Unspecified severe protein-calorie malnutrition: Secondary | ICD-10-CM | POA: Diagnosis present

## 2024-10-20 DIAGNOSIS — E1169 Type 2 diabetes mellitus with other specified complication: Secondary | ICD-10-CM | POA: Diagnosis not present

## 2024-10-20 DIAGNOSIS — J440 Chronic obstructive pulmonary disease with acute lower respiratory infection: Secondary | ICD-10-CM | POA: Diagnosis present

## 2024-10-20 DIAGNOSIS — E8729 Other acidosis: Secondary | ICD-10-CM | POA: Diagnosis not present

## 2024-10-20 DIAGNOSIS — J159 Unspecified bacterial pneumonia: Secondary | ICD-10-CM | POA: Diagnosis present

## 2024-10-20 DIAGNOSIS — F02B11 Dementia in other diseases classified elsewhere, moderate, with agitation: Secondary | ICD-10-CM

## 2024-10-20 DIAGNOSIS — E861 Hypovolemia: Secondary | ICD-10-CM | POA: Diagnosis present

## 2024-10-20 DIAGNOSIS — E1165 Type 2 diabetes mellitus with hyperglycemia: Secondary | ICD-10-CM | POA: Diagnosis present

## 2024-10-20 DIAGNOSIS — E860 Dehydration: Secondary | ICD-10-CM | POA: Diagnosis present

## 2024-10-20 DIAGNOSIS — B9620 Unspecified Escherichia coli [E. coli] as the cause of diseases classified elsewhere: Secondary | ICD-10-CM | POA: Diagnosis present

## 2024-10-20 DIAGNOSIS — N179 Acute kidney failure, unspecified: Secondary | ICD-10-CM | POA: Diagnosis present

## 2024-10-20 DIAGNOSIS — J1008 Influenza due to other identified influenza virus with other specified pneumonia: Principal | ICD-10-CM | POA: Diagnosis present

## 2024-10-20 DIAGNOSIS — Z833 Family history of diabetes mellitus: Secondary | ICD-10-CM

## 2024-10-20 DIAGNOSIS — Z794 Long term (current) use of insulin: Secondary | ICD-10-CM

## 2024-10-20 DIAGNOSIS — Z7982 Long term (current) use of aspirin: Secondary | ICD-10-CM

## 2024-10-20 DIAGNOSIS — E782 Mixed hyperlipidemia: Secondary | ICD-10-CM | POA: Diagnosis present

## 2024-10-20 DIAGNOSIS — J101 Influenza due to other identified influenza virus with other respiratory manifestations: Secondary | ICD-10-CM

## 2024-10-20 DIAGNOSIS — Z515 Encounter for palliative care: Secondary | ICD-10-CM

## 2024-10-20 DIAGNOSIS — I10 Essential (primary) hypertension: Secondary | ICD-10-CM

## 2024-10-20 DIAGNOSIS — F419 Anxiety disorder, unspecified: Secondary | ICD-10-CM | POA: Diagnosis present

## 2024-10-20 DIAGNOSIS — F17210 Nicotine dependence, cigarettes, uncomplicated: Secondary | ICD-10-CM | POA: Diagnosis present

## 2024-10-20 DIAGNOSIS — Z853 Personal history of malignant neoplasm of breast: Secondary | ICD-10-CM

## 2024-10-20 DIAGNOSIS — E559 Vitamin D deficiency, unspecified: Secondary | ICD-10-CM | POA: Diagnosis present

## 2024-10-20 DIAGNOSIS — Z681 Body mass index (BMI) 19 or less, adult: Secondary | ICD-10-CM | POA: Diagnosis not present

## 2024-10-20 DIAGNOSIS — J470 Bronchiectasis with acute lower respiratory infection: Secondary | ICD-10-CM | POA: Diagnosis present

## 2024-10-20 DIAGNOSIS — J449 Chronic obstructive pulmonary disease, unspecified: Secondary | ICD-10-CM | POA: Diagnosis not present

## 2024-10-20 DIAGNOSIS — Z79899 Other long term (current) drug therapy: Secondary | ICD-10-CM

## 2024-10-20 DIAGNOSIS — E11649 Type 2 diabetes mellitus with hypoglycemia without coma: Secondary | ICD-10-CM | POA: Diagnosis present

## 2024-10-20 DIAGNOSIS — Z981 Arthrodesis status: Secondary | ICD-10-CM

## 2024-10-20 DIAGNOSIS — H9190 Unspecified hearing loss, unspecified ear: Secondary | ICD-10-CM | POA: Diagnosis present

## 2024-10-20 DIAGNOSIS — N390 Urinary tract infection, site not specified: Secondary | ICD-10-CM

## 2024-10-20 DIAGNOSIS — K219 Gastro-esophageal reflux disease without esophagitis: Secondary | ICD-10-CM | POA: Diagnosis present

## 2024-10-20 DIAGNOSIS — Z888 Allergy status to other drugs, medicaments and biological substances status: Secondary | ICD-10-CM

## 2024-10-20 DIAGNOSIS — E785 Hyperlipidemia, unspecified: Secondary | ICD-10-CM | POA: Diagnosis not present

## 2024-10-20 DIAGNOSIS — R319 Hematuria, unspecified: Secondary | ICD-10-CM | POA: Diagnosis not present

## 2024-10-20 DIAGNOSIS — Z1152 Encounter for screening for COVID-19: Secondary | ICD-10-CM | POA: Diagnosis not present

## 2024-10-20 DIAGNOSIS — G309 Alzheimer's disease, unspecified: Secondary | ICD-10-CM | POA: Diagnosis present

## 2024-10-20 DIAGNOSIS — Z823 Family history of stroke: Secondary | ICD-10-CM

## 2024-10-20 DIAGNOSIS — Z9842 Cataract extraction status, left eye: Secondary | ICD-10-CM

## 2024-10-20 DIAGNOSIS — E870 Hyperosmolality and hypernatremia: Secondary | ICD-10-CM | POA: Diagnosis present

## 2024-10-20 DIAGNOSIS — R627 Adult failure to thrive: Secondary | ICD-10-CM | POA: Diagnosis present

## 2024-10-20 DIAGNOSIS — J410 Simple chronic bronchitis: Secondary | ICD-10-CM

## 2024-10-20 DIAGNOSIS — Z923 Personal history of irradiation: Secondary | ICD-10-CM

## 2024-10-20 DIAGNOSIS — Z7189 Other specified counseling: Secondary | ICD-10-CM | POA: Diagnosis not present

## 2024-10-20 LAB — TROPONIN T, HIGH SENSITIVITY
Troponin T High Sensitivity: 27 ng/L — ABNORMAL HIGH (ref 0–19)
Troponin T High Sensitivity: 27 ng/L — ABNORMAL HIGH (ref 0–19)

## 2024-10-20 LAB — COMPREHENSIVE METABOLIC PANEL WITH GFR
ALT: 14 U/L (ref 0–44)
AST: 26 U/L (ref 15–41)
Albumin: 3.2 g/dL — ABNORMAL LOW (ref 3.5–5.0)
Alkaline Phosphatase: 90 U/L (ref 38–126)
Anion gap: 22 — ABNORMAL HIGH (ref 5–15)
BUN: 112 mg/dL — ABNORMAL HIGH (ref 8–23)
CO2: 15 mmol/L — ABNORMAL LOW (ref 22–32)
Calcium: 8.4 mg/dL — ABNORMAL LOW (ref 8.9–10.3)
Chloride: 116 mmol/L — ABNORMAL HIGH (ref 98–111)
Creatinine, Ser: 4.13 mg/dL — ABNORMAL HIGH (ref 0.44–1.00)
GFR, Estimated: 11 mL/min — ABNORMAL LOW
Glucose, Bld: 149 mg/dL — ABNORMAL HIGH (ref 70–99)
Potassium: 4.5 mmol/L (ref 3.5–5.1)
Sodium: 154 mmol/L — ABNORMAL HIGH (ref 135–145)
Total Bilirubin: 0.5 mg/dL (ref 0.0–1.2)
Total Protein: 6.8 g/dL (ref 6.5–8.1)

## 2024-10-20 LAB — URINALYSIS, W/ REFLEX TO CULTURE (INFECTION SUSPECTED)
Bilirubin Urine: NEGATIVE
Glucose, UA: NEGATIVE mg/dL
Ketones, ur: NEGATIVE mg/dL
Nitrite: NEGATIVE
Protein, ur: 100 mg/dL — AB
Specific Gravity, Urine: 1.014 (ref 1.005–1.030)
WBC, UA: >50 WBC/hpf (ref 0–5)
pH: 5 (ref 5.0–8.0)

## 2024-10-20 LAB — LACTIC ACID, PLASMA
Lactic Acid, Venous: 1.9 mmol/L (ref 0.5–1.9)
Lactic Acid, Venous: 1.5 mmol/L (ref 0.5–1.9)

## 2024-10-20 LAB — CBC WITH DIFFERENTIAL/PLATELET
Abs Immature Granulocytes: 0.08 K/uL — ABNORMAL HIGH (ref 0.00–0.07)
Basophils Absolute: 0.1 K/uL (ref 0.0–0.1)
Basophils Relative: 1 %
Eosinophils Absolute: 0 K/uL (ref 0.0–0.5)
Eosinophils Relative: 0 %
HCT: 51.9 % — ABNORMAL HIGH (ref 36.0–46.0)
Hemoglobin: 15.5 g/dL — ABNORMAL HIGH (ref 12.0–15.0)
Immature Granulocytes: 1 %
Lymphocytes Relative: 10 %
Lymphs Abs: 1 K/uL (ref 0.7–4.0)
MCH: 26.5 pg (ref 26.0–34.0)
MCHC: 29.9 g/dL — ABNORMAL LOW (ref 30.0–36.0)
MCV: 88.6 fL (ref 80.0–100.0)
Monocytes Absolute: 0.4 K/uL (ref 0.1–1.0)
Monocytes Relative: 4 %
Neutro Abs: 8.6 K/uL — ABNORMAL HIGH (ref 1.7–7.7)
Neutrophils Relative %: 84 %
Platelets: 147 K/uL — ABNORMAL LOW (ref 150–400)
RBC: 5.86 MIL/uL — ABNORMAL HIGH (ref 3.87–5.11)
RDW: 14.8 % (ref 11.5–15.5)
WBC: 10.1 K/uL (ref 4.0–10.5)
nRBC: 0 % (ref 0.0–0.2)

## 2024-10-20 LAB — RESP PANEL BY RT-PCR (RSV, FLU A&B, COVID)  RVPGX2
Influenza A by PCR: POSITIVE — AB
Influenza B by PCR: NEGATIVE
Resp Syncytial Virus by PCR: NEGATIVE
SARS Coronavirus 2 by RT PCR: NEGATIVE

## 2024-10-20 LAB — MAGNESIUM: Magnesium: 3.4 mg/dL — ABNORMAL HIGH (ref 1.7–2.4)

## 2024-10-20 LAB — PRO BRAIN NATRIURETIC PEPTIDE: Pro Brain Natriuretic Peptide: 590 pg/mL — ABNORMAL HIGH

## 2024-10-20 MED ORDER — SODIUM CHLORIDE 0.9 % IV SOLN
1.0000 g | INTRAVENOUS | Status: DC
  Administered 2024-10-20 – 2024-10-21 (×2): 1 g via INTRAVENOUS
  Filled 2024-10-20 (×5): qty 10

## 2024-10-20 MED ORDER — SODIUM CHLORIDE 0.9 % IV SOLN
500.0000 mg | INTRAVENOUS | Status: DC
  Administered 2024-10-20: 500 mg via INTRAVENOUS
  Filled 2024-10-20: qty 5

## 2024-10-20 MED ORDER — LACTATED RINGERS IV BOLUS
1000.0000 mL | Freq: Once | INTRAVENOUS | Status: AC
  Administered 2024-10-20: 1000 mL via INTRAVENOUS

## 2024-10-20 MED ORDER — FENTANYL CITRATE (PF) 100 MCG/2ML IJ SOLN: 50.0000 ug | Freq: Once | INTRAMUSCULAR | Status: DC

## 2024-10-20 NOTE — ED Provider Notes (Signed)
 " [place] EMERGENCY DEPARTMENT AT [facility] Provider Note   CSN: [id number] Arrival date & time: 10/20/24  8165     Patient presents with: Hypotension   Jamie [name] is a 74 y.o. female.   HPI Patient presents for generalized weakness.  Medical history includes dementia, depression, anxiety, COPD, HTN, HLD, GERD, DM.  She has dementia at baseline.  She reportedly lives with her husband at home.  Has been has been in the hospital lately.  Her husband returned home and daughter came to visit.  Daughter noticed that patient was acting weak and confused more than usual.  EMS was called.  EMS noted hypotension on scene.  This improved without intervention.  She was given 1 L of IVF prior to arrival.  Patient, herself, unable provide any history at this time.    Prior to Admission medications  Medication Sig Start Date End Date Taking? Authorizing Provider  Accu-Chek Softclix Lancets lancets Use to check blood sugar two times daily  Dx E11.9 07/18/21   Lavell Lye A, FNP  albuterol  (PROVENTIL ) (2.5 MG/3ML) 0.083% nebulizer solution Take 3 mLs (2.5 mg total) by nebulization every 6 (six) hours as needed for wheezing or shortness of breath. 07/13/24   Lavell Lye LABOR, FNP  Alcohol Swabs (B-D SINGLE USE SWABS REGULAR) PADS Test BS BID Dx E11.9 08/02/19   Lavell Lye A, FNP  amLODipine  (NORVASC ) 10 MG tablet Take 1 tablet (10 mg total) by mouth daily. 07/13/24   Lavell Lye LABOR, FNP  amoxicillin -clavulanate (AUGMENTIN ) 875-125 MG tablet Take 1 tablet by mouth 2 (two) times daily. Take all of this medication 02/24/24   Zollie Lowers, MD  aspirin  EC 81 MG tablet Take 1 tablet (81 mg total) by mouth daily with breakfast. Swallow whole. 07/13/24   Lavell Lye LABOR, FNP  Blood Glucose Calibration (ACCU-CHEK AVIVA) SOLN Use with glucose machine Dx E11.9 08/02/19   Lavell Lye A, FNP  Blood Glucose Monitoring Suppl (ACCU-CHEK AVIVA PLUS) w/Device KIT Check BS BID Dx E11.9 08/02/19    Lavell Lye A, FNP  Blood Glucose Monitoring Suppl DEVI 1 each by Does not apply route in the morning, at noon, and at bedtime. May substitute to any manufacturer covered by patient's insurance. 01/06/24   Lavell Lye LABOR, FNP  Continuous Glucose Receiver (FREESTYLE LIBRE 3 READER) DEVI 1 each by Does not apply route daily. 12/01/23   Gladis Mary-Margaret, FNP  Continuous Glucose Sensor (FREESTYLE LIBRE 3 SENSOR) MISC CHANGE SENSOR EVERY 14 DAYS AS DIRECTED. USE TO CHECK GLUCOSE CONTINUOUSLY 02/11/24   Lavell Lye A, FNP  donepezil  (ARICEPT ) 10 MG tablet Take 1 tablet (10 mg total) by mouth at bedtime. 07/02/24   Lavell Lye LABOR, FNP  Fluticasone -Umeclidin-Vilant (TRELEGY ELLIPTA ) 100-62.5-25 MCG/ACT AEPB One click each am 11/25/23   Pearlean Manus, MD  glucose blood (ACCU-CHEK GUIDE) test strip Use to check blood sugar two times daily  Dx E11.9 12/25/21   Lavell Lye LABOR, FNP  glucose-Vitamin C (GLUCOSE INSTANT ENERGY) 4-0.006 GM CHEW chewable tablet Chew 1 tablet by mouth as needed for low blood sugar. 07/13/24   Lavell Lye LABOR, FNP  insulin  glargine (LANTUS  SOLOSTAR) 100 UNIT/ML Solostar Pen Inject 18 Units into the skin at bedtime. 07/13/24   Lavell Lye LABOR, FNP  Insulin  Pen Needle (DROPLET PEN NEEDLES) 32G X 5 MM MISC Use with insulin  daily Dx E11.9 10/03/21   Lavell Lye A, FNP  losartan  (COZAAR ) 100 MG tablet Take 1 tablet (100 mg total) by  mouth daily. 07/13/24   Lavell Lye A, FNP  memantine  (NAMENDA ) 10 MG tablet Take 1 tablet (10 mg total) by mouth 2 (two) times daily. 07/13/24   Lavell Lye A, FNP  pravastatin  (PRAVACHOL ) 40 MG tablet Take 1 tablet (40 mg total) by mouth daily. 07/13/24   Lavell Lye A, FNP  risperiDONE  (RISPERDAL ) 0.5 MG tablet Take 1 tablet (0.5 mg total) by mouth at bedtime. 11/25/23   Pearlean Manus, MD    Allergies: Lipitor [atorvastatin ] and Soma [carisoprodol]    Review of Systems  Unable to perform ROS: Dementia    Updated Vital Signs BP  114/69   Pulse 78   Temp 98.4 F (36.9 C)   Resp 20   Ht 5' (1.524 m)   Wt 39.9 kg   SpO2 92%   BMI 17.18 kg/m   Physical Exam Vitals and nursing note reviewed.  Constitutional:      General: She is not in acute distress.    Appearance: Normal appearance. She is well-developed. She is not toxic-appearing or diaphoretic.  HENT:     Head: Normocephalic and atraumatic.     Right Ear: External ear normal.     Left Ear: External ear normal.     Nose: Nose normal.     Mouth/Throat:     Mouth: Mucous membranes are dry.  Eyes:     Extraocular Movements: Extraocular movements intact.     Conjunctiva/sclera: Conjunctivae normal.  Cardiovascular:     Rate and Rhythm: Normal rate and regular rhythm.  Pulmonary:     Effort: Pulmonary effort is normal. No respiratory distress.  Chest:     Chest wall: No tenderness.  Abdominal:     General: There is no distension.     Palpations: Abdomen is soft.     Tenderness: There is no abdominal tenderness.  Musculoskeletal:        General: No swelling or deformity.     Cervical back: Normal range of motion and neck supple.  Skin:    General: Skin is warm and dry.     Coloration: Skin is not jaundiced or pale.  Neurological:     General: No focal deficit present.     Mental Status: She is alert and oriented to person, place, and time.  Psychiatric:        Mood and Affect: Mood normal.        Behavior: Behavior normal.     (all labs ordered are listed, but only abnormal results are displayed) Labs Reviewed  RESP PANEL BY RT-PCR (RSV, FLU A&B, COVID)  RVPGX2 - Abnormal; Notable for the following components:      Result Value   Influenza A by PCR POSITIVE (*)    All other components within normal limits  COMPREHENSIVE METABOLIC PANEL WITH GFR - Abnormal; Notable for the following components:   Sodium 154 (*)    Chloride 116 (*)    CO2 15 (*)    Glucose, Bld 149 (*)    BUN 112 (*)    Creatinine, Ser 4.13 (*)    Calcium  8.4 (*)     Albumin  3.2 (*)    GFR, Estimated 11 (*)    Anion gap 22 (*)    All other components within normal limits  CBC WITH DIFFERENTIAL/PLATELET - Abnormal; Notable for the following components:   RBC 5.86 (*)    Hemoglobin 15.5 (*)    HCT 51.9 (*)    MCHC 29.9 (*)    Platelets 147 (*)  Neutro Abs 8.6 (*)    Abs Immature Granulocytes 0.08 (*)    All other components within normal limits  PRO BRAIN NATRIURETIC PEPTIDE - Abnormal; Notable for the following components:   Pro Brain Natriuretic Peptide 590.0 (*)    All other components within normal limits  URINALYSIS, W/ REFLEX TO CULTURE (INFECTION SUSPECTED) - Abnormal; Notable for the following components:   Color, Urine AMBER (*)    APPearance CLOUDY (*)    Hgb urine dipstick SMALL (*)    Protein, ur 100 (*)    Leukocytes,Ua LARGE (*)    Bacteria, UA MANY (*)    All other components within normal limits  MAGNESIUM  - Abnormal; Notable for the following components:   Magnesium  3.4 (*)    All other components within normal limits  TROPONIN T, HIGH SENSITIVITY - Abnormal; Notable for the following components:   Troponin T High Sensitivity 27 (*)    All other components within normal limits  TROPONIN T, HIGH SENSITIVITY - Abnormal; Notable for the following components:   Troponin T High Sensitivity 27 (*)    All other components within normal limits  URINE CULTURE  LACTIC ACID, PLASMA  LACTIC ACID, PLASMA    EKG: EKG Interpretation Date/Time:  Wednesday October 20 2024 19:36:57 EST Ventricular Rate:  88 PR Interval:  142 QRS Duration:  76 QT Interval:  373 QTC Calculation: 452 R Axis:   105  Text Interpretation: Sinus rhythm Anterolateral infarct, old Confirmed by Melvenia Motto [phone]) on 10/20/2024 7:44:04 PM  Radiology: CT Cervical Spine Wo Contrast Result Date: 10/20/2024 EXAM: CT CERVICAL SPINE WITHOUT CONTRAST 10/20/2024 08:45:38 PM TECHNIQUE: CT of the cervical spine was performed without the administration of intravenous  contrast. Multiplanar reformatted images are provided for review. Automated exposure control, iterative reconstruction, and/or weight based adjustment of the mA/kV was utilized to reduce the radiation dose to as low as reasonably achievable. COMPARISON: 11/23/2023 CLINICAL HISTORY: Neck trauma (Age >= 65y) FINDINGS: BONES AND ALIGNMENT: No acute fracture or traumatic malalignment. DEGENERATIVE CHANGES: Prior ACDF at C6-C7. Diffuse degenerative disc and facet disease. SOFT TISSUES: No prevertebral soft tissue swelling. IMPRESSION: 1. No acute findings. 2. Postoperative and degenerative changes. Electronically signed by: Franky Crease MD MD 10/20/2024 09:08 PM EST RP Workstation: [id number]   CT Head Wo Contrast Result Date: 10/20/2024 EXAM: CT HEAD WITHOUT CONTRAST 10/20/2024 08:45:38 PM TECHNIQUE: CT of the head was performed without the administration of intravenous contrast. Automated exposure control, iterative reconstruction, and/or weight based adjustment of the mA/kV was utilized to reduce the radiation dose to as low as reasonably achievable. COMPARISON: 11/23/2023 CLINICAL HISTORY: Mental status change, unknown cause. FINDINGS: BRAIN AND VENTRICLES: No acute hemorrhage. No evidence of acute infarct. No hydrocephalus. No extra-axial collection. No mass effect or midline shift. Atrophy and chronic small vessel disease throughout the deep white matter. ORBITS: No acute abnormality. SINUSES: No acute abnormality. SOFT TISSUES AND SKULL: No acute soft tissue abnormality. No skull fracture. IMPRESSION: 1. No acute intracranial abnormality. 2. Atrophy, chronic small vessel disease. Electronically signed by: Franky Crease MD MD 10/20/2024 09:06 PM EST RP Workstation: [id number]   CT CHEST ABDOMEN PELVIS WO CONTRAST Result Date: 10/20/2024 EXAM: CT CHEST, ABDOMEN AND PELVIS WITHOUT CONTRAST 10/20/2024 08:45:38 PM TECHNIQUE: CT of the chest, abdomen and pelvis was performed without the administration of intravenous  contrast. Multiplanar reformatted images are provided for review. Automated exposure control, iterative reconstruction, and/or weight based adjustment of the mA/kV was utilized to reduce the radiation dose to as  low as reasonably achievable. COMPARISON: Chest radiograph 10/20/2024, CT abdomen and pelvis 12/31/2014, CT chest 08/25/2013, and CT thoracolumbar spine 11/23/2023. CLINICAL HISTORY: Polytrauma, blunt. Weakness and altered mental status. FINDINGS: CHEST: MEDIASTINUM AND LYMPH NODES: Heart and pericardium are unremarkable. Normal heart size. No pericardial effusion. The central airways are clear. No mediastinal, hilar or axillary lymphadenopathy. The thyroid  gland is unremarkable. The esophagus is decompressed. Calcification of the aorta and coronary arteries. No aortic aneurysm. LUNGS AND PLEURA: Emphysematous changes diffusely throughout the lungs. Bronchiectasis with bronchial wall thickening. Multiple nodular peribronchial infiltrates bilaterally. These changes are likely to represent chronic bronchitis with acute multifocal bronchopneumonia versus aspiration. No pleural effusion or pneumothorax. ABDOMEN AND PELVIS: LIMITATIONS/ARTIFACTS: Streak artifact from arms and hardware-limited evaluation of the abdomen and pelvis. LIVER: As visualized, the liver is unremarkable. GALLBLADDER AND BILE DUCTS: As visualized, the gallbladder is unremarkable. No biliary ductal dilatation. SPLEEN: As visualized, the spleen is unremarkable. PANCREAS: As visualized, the pancreas is unremarkable. ADRENAL GLANDS: As visualized, the adrenal glands are unremarkable. KIDNEYS, URETERS AND BLADDER: As visualized, the kidneys and ureters are unremarkable. No stones in the kidneys or ureters. No hydronephrosis. No perinephric or periureteral stranding. Gas in the bladder. This could arise from catheterization if performed recently or from infection. Correlate with urinalysis. GI AND BOWEL: Stomach, small bowel, and the colon are  mostly decompressed. There is no bowel obstruction. REPRODUCTIVE ORGANS: As visualized, the uterus and ovaries are unremarkable. PERITONEUM AND RETROPERITONEUM: No ascites. No free air. VASCULATURE: Aorta is normal in caliber. Dense calcification of the abdominal aorta. No aneurysm. ABDOMINAL AND PELVIS LYMPH NODES: No lymphadenopathy. BONES AND SOFT TISSUES: Postoperative posterior rod and screw fixation from T12 through L4. Anterior fixation of the cervical spine. Degenerative changes throughout the lumbar spine and cervical spine with narrowed interspaces and endplate osteophyte formation. Visualized surgical hardware appears intact. No acute bony abnormalities. Infiltration in the anterior abdominal wall subcutaneous fat consistent with injection sites. Circumscribed cystic lesion in the left pelvic subcutaneous fat measuring 2.2 cm in diameter, likely a sebaceous cyst. Calcified phleboliths in the pelvis. IMPRESSION: 1. No evidence of acute traumatic injury. 2. Multifocal bronchopneumonia versus aspiration. Chronic bronchitic changes with bronchiectasis. 3. Gas in the urinary bladder, which may be related to recent catheterization or infection; recommend urinalysis. Electronically signed by: Elsie Gravely MD 10/20/2024 08:56 PM EST RP Workstation: [id number]   DG Chest Port 1 View Result Date: 10/20/2024 EXAM: 1 VIEW(S) XRAY OF THE CHEST 10/20/2024 07:52:00 PM COMPARISON: CT of the chest 08/25/2013. CLINICAL HISTORY: Questionable sepsis - evaluate for abnormality. FINDINGS: LUNGS AND PLEURA: Lungs are hyperinflated, likely related to emphysema. A small nodular density projects over the left lower lung measuring 5 mm which is indeterminate. No pleural effusion. No pneumothorax. HEART AND MEDIASTINUM: Atherosclerotic calcifications of the aorta. No acute abnormality of the cardiac and mediastinal silhouettes. BONES AND SOFT TISSUES: Thoracic and cervical spinal fusion hardware present. No acute osseous  abnormality. IMPRESSION: 1. No acute findings. 2. Hyperinflated lungs, likely related to emphysema. 3. Small indeterminate 5 mm nodular density projecting over the left lower lung. Follow-up chest CT recommended. Electronically signed by: Greig Pique MD MD 10/20/2024 07:59 PM EST RP Workstation: [id number]     Procedures   Medications Ordered in the ED  azithromycin  (ZITHROMAX ) 500 mg in sodium chloride  0.9 % 250 mL IVPB (0 mg Intravenous Stopped 10/20/24 2309)  ceFEPIme  (MAXIPIME ) 1 g in sodium chloride  0.9 % 100 mL IVPB (1 g Intravenous New Bag/Given 10/20/24 2315)  fentaNYL  (SUBLIMAZE )  injection 50 mcg (has no administration in time range)  lactated ringers  bolus 1,000 mL (0 mLs Intravenous Stopped 10/20/24 2314)                                    Medical Decision Making Amount and/or Complexity of Data Reviewed Labs: ordered. Radiology: ordered.  Risk Prescription drug management.   This patient presents to the ED for concern of generalized weakness, this involves an extensive number of treatment options, and is a complaint that carries with it a high risk of complications and morbidity.  The differential diagnosis includes deconditioning, dehydration, metabolic derangements, infection polypharmacy   Co morbidities / Chronic conditions that complicate the patient evaluation  dementia, depression, anxiety, COPD, HTN, HLD, GERD, DM   Additional history obtained:  Additional history obtained from EMR External records from outside source obtained and reviewed including EMS   Lab Tests:  I Ordered, and personally interpreted labs.  The pertinent results include: Elevated hemoglobin consistent with hemoconcentration; normal WBC with neutrophilia; AKI is present.  Elevated BUN suggest prerenal etiology.  Hyponatremia is present in addition to anion gap metabolic acidosis.  Patient tested positive for influenza A.  Urinalysis is consistent with UTI.   Imaging Studies ordered:  I  ordered imaging studies including chest x-ray, CT of head, cervical spine, chest, abdomen, pelvis I independently visualized and interpreted imaging which showed findings consistent with multifocal bronchopneumonia and/or aspiration.  Gas in urinary bladder secondary to catheterization. I agree with the radiologist interpretation   Cardiac Monitoring: / EKG:  The patient was maintained on a cardiac monitor.  I personally viewed and interpreted the cardiac monitored which showed an underlying rhythm of: Sinus rhythm   Problem List / ED Course / Critical interventions / Medication management  Patient presenting for concern of altered mental status.  EMS noted hypotension on scene.  Her blood pressures did improve prior to IV fluids.  She subsequently got 1 L of IV fluid prior to arrival.  On arrival, she is normotensive.  On exam, patient denying any areas of pain.  She states that she wants to go home.  She does have dry oral mucosa.  She is overall cachectic in appearance.  She does not seem to have any areas of deformity or tenderness.  Workup was initiated.  Patient's lab work was notable for acute renal failure with hyponatremia and anion gap metabolic acidosis.  I suspect acidosis is secondary to elevated BUN.  Hemoglobin is elevated consistent with hemoconcentration.  Additional IV fluids were ordered.  Patient's urinalysis is consistent with infection.  Cefepime  was ordered.  Imaging studies showed evidence of multifocal bronchopneumonia.  Azithromycin  ordered for further antibiotic coverage.  On reassessment, patient appears to be resting comfortably.  When attempted to reposition, she does cry out as if in pain.  Fentanyl  was ordered for analgesia.  On reassessment, patient resting comfortably.  She remains normotensive.  Patient to be admitted for further management. I ordered medication including IV fluids regulation, cefepime  for UTI, azithromycin  for pneumonia, fentanyl  for  analgesia Reevaluation of the patient after these medicines showed that the patient improved I have reviewed the patients home medicines and have made adjustments as needed  Social Determinants of Health:  Has dementia, lives at home with family      Final diagnoses:  Multifocal pneumonia  Influenza A  Urinary tract infection with hematuria, site unspecified  AKI (acute kidney  injury)    ED Discharge Orders     None          Melvenia Motto, MD 10/20/24 2317  "

## 2024-10-20 NOTE — H&P (Signed)
 " History and Physical    Patient: Jamie [name] FMW:[id number] DOB: [birth year] DOA: 10/20/2024 DOS: the patient was seen and examined on 10/20/2024 PCP: Lavell Bari LABOR, FNP  Patient coming from: Home  Chief Complaint:  Chief Complaint  Patient presents with   Hypotension   HPI: Jamie [name] is a 74 y.o. female with medical history significant of hypertension, hyperlipidemia, T2DM, COPD, dementia who presents to the emergency department from home via EMS due to generalized weakness.  She lives with her husband (her provider) who was recently admitted to the hospital due to flu and just discharged home.  Daughter came to visit and noted that patient was weak and was more confused than usual, so she activated EMS, on arrival of EMS team, she was noted to be hypotensive, this later self- improved with IV hydration was provided PTA.  ED course In the emergency department, she was hemodynamically stable.  Workup in the ED showed increased H/H (15.5/51.9), platelets 147.  BMP shows sodium 144, potassium 4.5, chloride 116, bicarb 15, blood glucose 149, BUN 112, creatinine 4.13, albumin  3.2, magnesium  3.4, proBNP 590, troponin x 2 - 27 > 27.  Influenza A was positive. CT chest, abdomen and pelvis without contrast showed no evidence of acute traumatic injury, but showed multifocal bronchopneumonia versus aspiration.  Chronic bronchitic changes with bronchiectasis.  Gas in the urinary bladder with suspicion for infection. IV cefepime  and azithromycin  was given.  IV hydration was provided.  TRH was asked to admit patient.   Review of Systems: As mentioned in the history of present illness. All other systems reviewed and are negative. Past Medical History:  Diagnosis Date   Anxiety    Breast cancer (HCC)    COPD (chronic obstructive pulmonary disease) (HCC)    Deaf    Dementia (HCC)    Depression    Diabetes (HCC)    HOH (hard of hearing)    HTN (hypertension)    Hyperlipidemia    Personal  history of radiation therapy    Sundowning    Vitamin D  deficiency    Past Surgical History:  Procedure Laterality Date   APPLICATION OF ROBOTIC ASSISTANCE FOR SPINAL PROCEDURE N/A 02/23/2019   Procedure: APPLICATION OF ROBOTIC ASSISTANCE FOR SPINAL PROCEDURE;  Surgeon: Colon Shove, MD;  Location: [facility] OR;  Service: Neurosurgery;  Laterality: N/A;   BACK SURGERY     BREAST SURGERY     radiation   CARPAL TUNNEL RELEASE     CERVICAL SPINE SURGERY     EYE SURGERY Bilateral    cataracts   GALLBLADDER SURGERY     INNER EAR SURGERY     POSTERIOR LUMBAR FUSION 4 LEVEL N/A 02/23/2019   Procedure: Thoracic ten to Lumbar two decompression with Posterior lumbar interbody fusion lumbar one-lumbar two with Osteotomy at Lumbar one-Lumbar two, Posterior arthrodesis Thoracic ten to Lumbar two with Mazor;  Surgeon: Colon Shove, MD;  Location: [facility] OR;  Service: Neurosurgery;  Laterality: N/A;   SPINE SURGERY     Social History:  reports that she has been smoking cigarettes. She started smoking about 57 years ago. She has a 57.2 pack-year smoking history. She has never used smokeless tobacco. She reports that she does not drink alcohol and does not use drugs.  Allergies[1]  Family History  Problem Relation Age of Onset   Heart attack Mother    Stroke Mother    Stroke Sister    Diabetes Other        sister x3,  and mother   Lung disease Father        worked in [employer] cancer Neg Hx    Colon polyps Neg Hx     Prior to Admission medications  Medication Sig Start Date End Date Taking? Authorizing Provider  Accu-Chek Softclix Lancets lancets Use to check blood sugar two times daily  Dx E11.9 07/18/21   Lavell Lye A, FNP  albuterol  (PROVENTIL ) (2.5 MG/3ML) 0.083% nebulizer solution Take 3 mLs (2.5 mg total) by nebulization every 6 (six) hours as needed for wheezing or shortness of breath. 07/13/24   Lavell Lye LABOR, FNP  Alcohol Swabs (B-D SINGLE USE SWABS REGULAR) PADS Test BS BID Dx  E11.9 08/02/19   Lavell Lye A, FNP  amLODipine  (NORVASC ) 10 MG tablet Take 1 tablet (10 mg total) by mouth daily. 07/13/24   Lavell Lye LABOR, FNP  amoxicillin -clavulanate (AUGMENTIN ) 875-125 MG tablet Take 1 tablet by mouth 2 (two) times daily. Take all of this medication 02/24/24   Zollie Lowers, MD  aspirin  EC 81 MG tablet Take 1 tablet (81 mg total) by mouth daily with breakfast. Swallow whole. 07/13/24   Lavell Lye LABOR, FNP  Blood Glucose Calibration (ACCU-CHEK AVIVA) SOLN Use with glucose machine Dx E11.9 08/02/19   Lavell Lye A, FNP  Blood Glucose Monitoring Suppl (ACCU-CHEK AVIVA PLUS) w/Device KIT Check BS BID Dx E11.9 08/02/19   Lavell Lye A, FNP  Blood Glucose Monitoring Suppl DEVI 1 each by Does not apply route in the morning, at noon, and at bedtime. May substitute to any manufacturer covered by patient's insurance. 01/06/24   Lavell Lye LABOR, FNP  Continuous Glucose Receiver (FREESTYLE LIBRE 3 READER) DEVI 1 each by Does not apply route daily. 12/01/23   Gladis Mary-Margaret, FNP  Continuous Glucose Sensor (FREESTYLE LIBRE 3 SENSOR) MISC CHANGE SENSOR EVERY 14 DAYS AS DIRECTED. USE TO CHECK GLUCOSE CONTINUOUSLY 02/11/24   Hawks, Lye A, FNP  donepezil  (ARICEPT ) 10 MG tablet Take 1 tablet (10 mg total) by mouth at bedtime. 07/02/24   Lavell Lye LABOR, FNP  Fluticasone -Umeclidin-Vilant (TRELEGY ELLIPTA ) 100-62.5-25 MCG/ACT AEPB One click each am 11/25/23   Pearlean Manus, MD  glucose blood (ACCU-CHEK GUIDE) test strip Use to check blood sugar two times daily  Dx E11.9 12/25/21   Lavell Lye LABOR, FNP  glucose-Vitamin C (GLUCOSE INSTANT ENERGY) 4-0.006 GM CHEW chewable tablet Chew 1 tablet by mouth as needed for low blood sugar. 07/13/24   Lavell Lye LABOR, FNP  insulin  glargine (LANTUS  SOLOSTAR) 100 UNIT/ML Solostar Pen Inject 18 Units into the skin at bedtime. 07/13/24   Lavell Lye LABOR, FNP  Insulin  Pen Needle (DROPLET PEN NEEDLES) 32G X 5 MM MISC Use with insulin  daily Dx  E11.9 10/03/21   Lavell Lye A, FNP  losartan  (COZAAR ) 100 MG tablet Take 1 tablet (100 mg total) by mouth daily. 07/13/24   Lavell Lye LABOR, FNP  memantine  (NAMENDA ) 10 MG tablet Take 1 tablet (10 mg total) by mouth 2 (two) times daily. 07/13/24   Lavell Lye A, FNP  pravastatin  (PRAVACHOL ) 40 MG tablet Take 1 tablet (40 mg total) by mouth daily. 07/13/24   Lavell Lye LABOR, FNP  risperiDONE  (RISPERDAL ) 0.5 MG tablet Take 1 tablet (0.5 mg total) by mouth at bedtime. 11/25/23   Pearlean Manus, MD    Physical Exam: Vitals:   10/20/24 2215 10/20/24 2224 10/20/24 2230 10/20/24 2245  BP: 103/69  114/69   Pulse: 89 89 81 78  Resp:  Temp:      SpO2: 91% 94% 91% 92%  Weight:      Height:       General: Elderly female. Awake and alert and oriented x3. Not in any acute distress.  HEENT: NCAT.  PERRLA. EOMI. Sclerae anicteric.  Dry mucosal membranes. Neck: Neck supple without lymphadenopathy. No carotid bruits. No masses palpated.  Cardiovascular: Regular rate with normal S1-S2 sounds. No murmurs, rubs or gallops auscultated. No JVD.  Respiratory: Clear breath sounds.  No accessory muscle use. Abdomen: Soft, nontender, nondistended. Active bowel sounds. No masses or hepatosplenomegaly  Skin: No rashes, lesions, or ulcerations.  Dry, warm to touch. Musculoskeletal:  2+ dorsalis pedis and radial pulses. Good ROM.  No contractures  Psychiatric: Intact judgment and insight.  Mood appropriate to current condition. Neurologic: No focal neurological deficits. Strength is 5/5 x 4.  CN II - XII grossly intact.  Assessment and Plan: Multifocal pneumonia POA Patient was started on cefepime  and azithromycin , we shall continue same at this time with plan to de-escalate/discontinue based on blood culture, sputum culture, urine Legionella, strep pneumo   Continue Tylenol  as needed Continue Mucinex , incentive spirometry, flutter valve   Influenza A Continue symptomatic treatment Tamiflu  is  contraindicated at this time due to patient's low creatinine level.  UTI POA Continue cefepime  Urine culture pending  Hypermagnesemia Magnesium  3.4 EKG personally reviewed showed normal sinus rhythm with rate of 88 bpm IV hydration provided.  Recheck magnesium  level in the morning and treat accordingly  Acute kidney injury Dehydration Creatinine 4.13, baseline creatinine: 0.7-1.0 Continue gentle hydration Renally adjust medications, avoid nephrotoxic agents/dehydration/hypotension  High Anion gap metabolic acidosis possibly due to uremia and dehydration Patient with bicarb of 15 and anion gap of 22 Continue IV hydration and continue to monitor for anion gap closure and improved bicarb  Type 2 diabetes mellitus with hyperglycemia A1c on 07/13/2024 was 7.7 Continue Semglee  8 units nightly Continue ISS and hypoglycemia protocol  Underweight (BMI 15.28) Failure to thrive in adult Protein supplement will be provided Dietitian will be consulted and we shall await further recommendations  COPD (not in acute exacerbation) Continue albuterol , Breztri    Dementia Continue Aricept , Namenda    Mixed hyperlipidemia Continue pravastatin    Continue total input/output, daily weights and fluid restriction Continue IV Lasix Continue Cardiac diet  EKG and Echocardiogram in the morning   Essential hypertension Continue amlodipine  and losartan    Advance Care Planning: Full code  Consults: Dietitian  Family Communication: None at bedside  Severity of Illness: The appropriate patient status for this patient is INPATIENT. Inpatient status is judged to be reasonable and necessary in order to provide the required intensity of service to ensure the patient's safety. The patient's presenting symptoms, physical exam findings, and initial radiographic and laboratory data in the context of their chronic comorbidities is felt to place them at high risk for further clinical deterioration.  Furthermore, it is not anticipated that the patient will be medically stable for discharge from the hospital within 2 midnights of admission.   * I certify that at the point of admission it is my clinical judgment that the patient will require inpatient hospital care spanning beyond 2 midnights from the point of admission due to high intensity of service, high risk for further deterioration and high frequency of surveillance required.*  Author: Aliyyah Riese, DO 10/20/2024 11:38 PM  For on call review [email or web address].      [1]  Allergies Allergen Reactions   Lipitor [Atorvastatin ] Other (See Comments)  Constipation    Soma [Carisoprodol] Nausea And Vomiting   "

## 2024-10-20 NOTE — ED Triage Notes (Signed)
 Pt arrived via [facility]. EMS called out by daughter because pt was weak and not acting herself. Pt's husband just got back home after being admitted in hospital with flu. Pt demented at baseline and family thinks that pt maybe just wasn't getting enough fluids while husband was sick. EMS gave pt 1000 NS and Bp improved from 60's systolic to 100's systolic. Pt agitated during triage and states that she just wants to go home.

## 2024-10-21 ENCOUNTER — Encounter (HOSPITAL_COMMUNITY): Payer: Self-pay | Admitting: Internal Medicine

## 2024-10-21 DIAGNOSIS — E43 Unspecified severe protein-calorie malnutrition: Secondary | ICD-10-CM | POA: Insufficient documentation

## 2024-10-21 LAB — COMPREHENSIVE METABOLIC PANEL WITH GFR
ALT: 15 U/L (ref 0–44)
AST: 24 U/L (ref 15–41)
Albumin: 3.1 g/dL — ABNORMAL LOW (ref 3.5–5.0)
Alkaline Phosphatase: 85 U/L (ref 38–126)
Anion gap: 14 (ref 5–15)
BUN: 105 mg/dL — ABNORMAL HIGH (ref 8–23)
CO2: 21 mmol/L — ABNORMAL LOW (ref 22–32)
Calcium: 8.1 mg/dL — ABNORMAL LOW (ref 8.9–10.3)
Chloride: 119 mmol/L — ABNORMAL HIGH (ref 98–111)
Creatinine, Ser: 3.4 mg/dL — ABNORMAL HIGH (ref 0.44–1.00)
GFR, Estimated: 14 mL/min — ABNORMAL LOW
Glucose, Bld: 121 mg/dL — ABNORMAL HIGH (ref 70–99)
Potassium: 4.1 mmol/L (ref 3.5–5.1)
Sodium: 155 mmol/L — ABNORMAL HIGH (ref 135–145)
Total Bilirubin: 0.3 mg/dL (ref 0.0–1.2)
Total Protein: 5.9 g/dL — ABNORMAL LOW (ref 6.5–8.1)

## 2024-10-21 LAB — PHOSPHORUS: Phosphorus: 4.5 mg/dL (ref 2.5–4.6)

## 2024-10-21 LAB — MRSA NEXT GEN BY PCR, NASAL: MRSA by PCR Next Gen: NOT DETECTED

## 2024-10-21 LAB — CBC
HCT: 40.2 % (ref 36.0–46.0)
Hemoglobin: 12.3 g/dL (ref 12.0–15.0)
MCH: 26.2 pg (ref 26.0–34.0)
MCHC: 30.6 g/dL (ref 30.0–36.0)
MCV: 85.5 fL (ref 80.0–100.0)
Platelets: 166 K/uL (ref 150–400)
RBC: 4.7 MIL/uL (ref 3.87–5.11)
RDW: 14.6 % (ref 11.5–15.5)
WBC: 9.2 K/uL (ref 4.0–10.5)
nRBC: 0 % (ref 0.0–0.2)

## 2024-10-21 LAB — GLUCOSE, CAPILLARY
Glucose-Capillary: 107 mg/dL — ABNORMAL HIGH (ref 70–99)
Glucose-Capillary: 131 mg/dL — ABNORMAL HIGH (ref 70–99)
Glucose-Capillary: 143 mg/dL — ABNORMAL HIGH (ref 70–99)
Glucose-Capillary: 186 mg/dL — ABNORMAL HIGH (ref 70–99)

## 2024-10-21 LAB — HIV ANTIBODY (ROUTINE TESTING W REFLEX): HIV Screen 4th Generation wRfx: NONREACTIVE

## 2024-10-21 LAB — MAGNESIUM: Magnesium: 2.9 mg/dL — ABNORMAL HIGH (ref 1.7–2.4)

## 2024-10-21 LAB — STREP PNEUMONIAE URINARY ANTIGEN: Strep Pneumo Urinary Antigen: NEGATIVE

## 2024-10-21 LAB — HEMOGLOBIN A1C
Hgb A1c MFr Bld: 8.2 % — ABNORMAL HIGH (ref 4.8–5.6)
Mean Plasma Glucose: 188.64 mg/dL

## 2024-10-21 MED ORDER — ALBUTEROL SULFATE (2.5 MG/3ML) 0.083% IN NEBU: 2.5000 mg | INHALATION_SOLUTION | Freq: Four times a day (QID) | RESPIRATORY_TRACT | Status: DC | PRN

## 2024-10-21 MED ORDER — AMLODIPINE BESYLATE 5 MG PO TABS
10.0000 mg | ORAL_TABLET | Freq: Every day | ORAL | Status: DC
  Administered 2024-10-21 – 2024-11-01 (×12): 10 mg via ORAL
  Filled 2024-10-21 (×12): qty 2

## 2024-10-21 MED ORDER — ADULT MULTIVITAMIN W/MINERALS CH
1.0000 | ORAL_TABLET | Freq: Every day | ORAL | Status: DC
  Administered 2024-10-21 – 2024-11-01 (×12): 1 via ORAL
  Filled 2024-10-21 (×12): qty 1

## 2024-10-21 MED ORDER — CHLORHEXIDINE GLUCONATE CLOTH 2 % EX PADS
6.0000 | MEDICATED_PAD | Freq: Every day | CUTANEOUS | Status: DC
  Administered 2024-10-21 – 2024-10-25 (×5): 6 via TOPICAL

## 2024-10-21 MED ORDER — DEXTROSE 5 % IV SOLN: INTRAVENOUS | Status: AC

## 2024-10-21 MED ORDER — INSULIN ASPART 100 UNIT/ML IJ SOLN
0.0000 [IU] | Freq: Three times a day (TID) | INTRAMUSCULAR | Status: DC
  Administered 2024-10-21 (×2): 1 [IU] via SUBCUTANEOUS
  Administered 2024-10-22: 5 [IU] via SUBCUTANEOUS
  Administered 2024-10-22: 7 [IU] via SUBCUTANEOUS
  Administered 2024-10-24: 3 [IU] via SUBCUTANEOUS
  Administered 2024-10-25: 7 [IU] via SUBCUTANEOUS
  Administered 2024-10-25: 3 [IU] via SUBCUTANEOUS
  Administered 2024-10-25: 5 [IU] via SUBCUTANEOUS
  Administered 2024-10-26 (×2): 7 [IU] via SUBCUTANEOUS
  Administered 2024-10-26 – 2024-10-27 (×2): 3 [IU] via SUBCUTANEOUS
  Administered 2024-10-27: 2 [IU] via SUBCUTANEOUS
  Administered 2024-10-27: 1 [IU] via SUBCUTANEOUS
  Administered 2024-10-28: 3 [IU] via SUBCUTANEOUS
  Administered 2024-10-28: 1 [IU] via SUBCUTANEOUS
  Administered 2024-10-28: 5 [IU] via SUBCUTANEOUS
  Administered 2024-10-29: 7 [IU] via SUBCUTANEOUS
  Administered 2024-10-29 – 2024-10-30 (×2): 9 [IU] via SUBCUTANEOUS
  Administered 2024-10-30: 7 [IU] via SUBCUTANEOUS
  Administered 2024-10-30: 1 [IU] via SUBCUTANEOUS
  Administered 2024-10-31: 9 [IU] via SUBCUTANEOUS
  Administered 2024-10-31: 2 [IU] via SUBCUTANEOUS
  Administered 2024-10-31: 9 [IU] via SUBCUTANEOUS
  Administered 2024-11-01: 5 [IU] via SUBCUTANEOUS
  Filled 2024-10-21 (×24): qty 1

## 2024-10-21 MED ORDER — DONEPEZIL HCL 5 MG PO TABS
10.0000 mg | ORAL_TABLET | Freq: Every day | ORAL | Status: DC
  Administered 2024-10-21 – 2024-10-31 (×11): 10 mg via ORAL
  Filled 2024-10-21 (×11): qty 2

## 2024-10-21 MED ORDER — ACETAMINOPHEN 325 MG PO TABS
650.0000 mg | ORAL_TABLET | Freq: Four times a day (QID) | ORAL | Status: DC | PRN
  Administered 2024-10-23 – 2024-11-01 (×3): 650 mg via ORAL
  Filled 2024-10-21 (×3): qty 2

## 2024-10-21 MED ORDER — ONDANSETRON HCL 4 MG PO TABS: 4.0000 mg | ORAL_TABLET | Freq: Four times a day (QID) | ORAL | Status: DC | PRN

## 2024-10-21 MED ORDER — BUDESON-GLYCOPYRROL-FORMOTEROL 160-9-4.8 MCG/ACT IN AERO
2.0000 | INHALATION_SPRAY | Freq: Two times a day (BID) | RESPIRATORY_TRACT | Status: DC
  Administered 2024-10-24 – 2024-11-01 (×16): 2 via RESPIRATORY_TRACT
  Filled 2024-10-21 (×3): qty 5.9

## 2024-10-21 MED ORDER — PRAVASTATIN SODIUM 40 MG PO TABS
40.0000 mg | ORAL_TABLET | Freq: Every day | ORAL | Status: DC
  Administered 2024-10-21 – 2024-11-01 (×12): 40 mg via ORAL
  Filled 2024-10-21 (×12): qty 1

## 2024-10-21 MED ORDER — GLUCERNA SHAKE PO LIQD
237.0000 mL | Freq: Three times a day (TID) | ORAL | Status: DC
  Administered 2024-10-21: 237 mL via ORAL

## 2024-10-21 MED ORDER — LOSARTAN POTASSIUM 50 MG PO TABS: 100.0000 mg | ORAL_TABLET | Freq: Every day | ORAL | Status: DC

## 2024-10-21 MED ORDER — DOXYCYCLINE HYCLATE 100 MG PO TABS
100.0000 mg | ORAL_TABLET | Freq: Two times a day (BID) | ORAL | Status: AC
  Administered 2024-10-21 – 2024-10-25 (×9): 100 mg via ORAL
  Filled 2024-10-21 (×9): qty 1

## 2024-10-21 MED ORDER — LACTATED RINGERS IV SOLN: INTRAVENOUS | Status: DC

## 2024-10-21 MED ORDER — HEPARIN SODIUM (PORCINE) 5000 UNIT/ML IJ SOLN
5000.0000 [IU] | Freq: Three times a day (TID) | INTRAMUSCULAR | Status: DC
  Administered 2024-10-21 – 2024-11-01 (×34): 5000 [IU] via SUBCUTANEOUS
  Filled 2024-10-21 (×34): qty 1

## 2024-10-21 MED ORDER — OSELTAMIVIR PHOSPHATE 30 MG PO CAPS
30.0000 mg | ORAL_CAPSULE | ORAL | Status: DC
  Administered 2024-10-21: 30 mg via ORAL
  Filled 2024-10-21: qty 1

## 2024-10-21 MED ORDER — MEMANTINE HCL 10 MG PO TABS
10.0000 mg | ORAL_TABLET | Freq: Two times a day (BID) | ORAL | Status: DC
  Administered 2024-10-21 – 2024-11-01 (×23): 10 mg via ORAL
  Filled 2024-10-21 (×23): qty 1

## 2024-10-21 MED ORDER — INSULIN ASPART 100 UNIT/ML IJ SOLN
0.0000 [IU] | Freq: Every day | INTRAMUSCULAR | Status: DC
  Administered 2024-10-22: 4 [IU] via SUBCUTANEOUS
  Administered 2024-10-25: 2 [IU] via SUBCUTANEOUS
  Administered 2024-10-26: 3 [IU] via SUBCUTANEOUS
  Administered 2024-10-28: 2 [IU] via SUBCUTANEOUS
  Administered 2024-10-30: 3 [IU] via SUBCUTANEOUS
  Filled 2024-10-21 (×4): qty 1

## 2024-10-21 MED ORDER — DM-GUAIFENESIN ER 30-600 MG PO TB12
1.0000 | ORAL_TABLET | Freq: Two times a day (BID) | ORAL | Status: DC
  Administered 2024-10-21 – 2024-10-22 (×4): 1 via ORAL
  Filled 2024-10-21 (×6): qty 1

## 2024-10-21 MED ORDER — ACETAMINOPHEN 650 MG RE SUPP
650.0000 mg | Freq: Four times a day (QID) | RECTAL | Status: DC | PRN
  Administered 2024-10-25: 650 mg via RECTAL
  Filled 2024-10-21: qty 1

## 2024-10-21 MED ORDER — OSELTAMIVIR PHOSPHATE 75 MG PO CAPS: 75.0000 mg | ORAL_CAPSULE | Freq: Two times a day (BID) | ORAL | Status: DC

## 2024-10-21 MED ORDER — ENSURE PLUS HIGH PROTEIN PO LIQD
237.0000 mL | Freq: Two times a day (BID) | ORAL | Status: DC
  Administered 2024-10-21 – 2024-11-01 (×22): 237 mL via ORAL

## 2024-10-21 MED ORDER — ONDANSETRON HCL 4 MG/2ML IJ SOLN: 4.0000 mg | Freq: Four times a day (QID) | INTRAMUSCULAR | Status: DC | PRN

## 2024-10-21 MED ORDER — INSULIN GLARGINE-YFGN 100 UNIT/ML ~~LOC~~ SOLN
8.0000 [IU] | Freq: Every day | SUBCUTANEOUS | Status: DC
  Administered 2024-10-22: 8 [IU] via SUBCUTANEOUS
  Filled 2024-10-21 (×2): qty 0.08

## 2024-10-21 NOTE — Progress Notes (Signed)
 Initial Nutrition Assessment  DOCUMENTATION CODES:  Severe malnutrition in context of chronic illness, Underweight  INTERVENTION:  Liberalize diet to regular Magic cup TID with meals, each supplement provides 290 kcal and 9 grams of protein Change PO supplement to Ensure Plus High Protein po BID, each supplement provides 350 kcal and 20 grams of protein MVI with minerals daily  NUTRITION DIAGNOSIS:  Severe Malnutrition related to chronic illness (COPD) as evidenced by severe muscle depletion, severe fat depletion, percent weight loss (11% weight loss within 6 months).  GOAL:  Patient will meet greater than or equal to 90% of their needs  MONITOR:  PO intake, Supplement acceptance  REASON FOR ASSESSMENT:  Consult Assessment of nutrition requirement/status  ASSESSMENT:  74 yo female admitted with hypotension, multifocal PNA, influenza A. PMH includes COPD, HTN, HLD, DM, vitamin D  deficiency, HOH, breast cancer, dementia.  Patient is very HOH and confused. She was unable to provide any nutrition hx. Per discussion with patient's nurse tech, she has been refusing to eat. Currently ordered Glucerna shake BID, will change to Ensure Plus High Protein for additional calories and double the protein per carton. Will also add magic cups to meal trays.   Usual weight: 39.9 kg (07/13/24) Current weight: 35.5 kg 11% weight loss within 6 months is severe.  Average Meal Intake: Not recorded  Nutritionally Relevant Medications: Scheduled Meds:  amLODipine   10 mg Oral Daily   budesonide -glycopyrrolate -formoterol   2 puff Inhalation BID   Chlorhexidine  Gluconate Cloth  6 each Topical Q0600   dextromethorphan -guaiFENesin   1 tablet Oral BID   donepezil   10 mg Oral QHS   doxycycline   100 mg Oral Q12H   feeding supplement  237 mL Oral BID BM   fentaNYL  (SUBLIMAZE ) injection  50 mcg Intravenous Once   heparin   5,000 Units Subcutaneous Q8H   insulin  aspart  0-5 Units Subcutaneous QHS   insulin   aspart  0-9 Units Subcutaneous TID WC   insulin  glargine-yfgn  8 Units Subcutaneous QHS   memantine   10 mg Oral BID   multivitamin with minerals  1 tablet Oral Daily   pravastatin   40 mg Oral Daily   Continuous Infusions:  ceFEPime  (MAXIPIME ) IV Stopped (10/20/24 2345)   dextrose      PRN Meds:.acetaminophen  **OR** acetaminophen , albuterol , ondansetron  **OR** ondansetron  (ZOFRAN ) IV  Labs Reviewed: Na 155 CBG 107 this AM HgbA1c 8.2  NUTRITION - FOCUSED PHYSICAL EXAM: Flowsheet Row Most Recent Value  Orbital Region Severe depletion  Upper Arm Region Severe depletion  Thoracic and Lumbar Region Severe depletion  Buccal Region Severe depletion  Temple Region Severe depletion  Clavicle Bone Region Severe depletion  Clavicle and Acromion Bone Region Severe depletion  Scapular Bone Region Severe depletion  Dorsal Hand Severe depletion  Patellar Region Severe depletion  Anterior Thigh Region Severe depletion  Posterior Calf Region Severe depletion  Edema (RD Assessment) None  Hair Reviewed  Eyes Reviewed  Mouth Reviewed  Skin Reviewed  Nails Reviewed    Diet Order:   Diet Order             Diet regular Room service appropriate? Yes; Fluid consistency: Thin  Diet effective now                   EDUCATION NEEDS:  Not appropriate for education at this time  Skin:  Skin Assessment: Reviewed RN Assessment  Last BM:  unknown  Height:  Ht Readings from Last 1 Encounters:  10/21/24 5' (1.524 m)   Weight:  Wt Readings from Last 1 Encounters:  10/21/24 35.5 kg   Ideal Body Weight:  45.5 kg  BMI:  Body mass index is 15.28 kg/m.  Estimated Nutritional Needs:  Kcal:  1300-1500 Protein:  55-65 gm Fluid:  1.5 L   Suzen HUNT RD, LDN, CNSC Contact via secure chat. If unavailable, use group chat RD Inpatient.

## 2024-10-21 NOTE — Progress Notes (Addendum)
 " PROGRESS NOTE    TRUST CRAGO  FMW:[id number] DOB: 1950-11-01 DOA: 10/20/2024 PCP: Lavell Bari LABOR, FNP   Brief Narrative:   74 y.o. female with medical history significant of hypertension, hyperlipidemia, T2DM, COPD, dementia who presents to the emergency department from home via EMS due to generalized weakness.  Daughter came to visit and noticed that she is weaker and more confused than usual so she called EMS. On arrival in the emergency room, patient was found to be hypotensive which improved with intravenous fluids.  Of the chest abdomen pelvis without contrast showed no evidence of acute traumatic injury but did show multifocal bronchopneumonia versus aspiration.  She received IV azithromycin  and cefepime  along with intravenous fluids in the ED and [facility] was activated called to admit the patient.  She lives at home with her husband.  She has advanced dementia.  Assessment & Plan:  Principal Problem:   Multifocal pneumonia   Multifocal bacterial community-acquired pneumonia, POA: in the setting of Influenza A infection.  I reviewed the CT of the chest which showed evidence of multifocal pneumonia.  Continue with intravenous cefepime  and azithromycin .  Follow-up blood cultures, urine Legionella and pneumococcal antigens.  Continue with Tylenol  as needed for fever as well as Mucinex , incentive spirometry and flutter valve. MRSA PCR is negative   Influenza A, POA: Continue with symptomatic treatment.  Tamiflu  initiated at 30 mg Q48H x 5 days (renally adjusted dose).  Acute kidney injury, POA: Creatinine of 4.13 on admission.  Likely prerenal in the setting of dehydration.  Creatinine has improved to 3.4 today.  No evidence of obstructive hydronephrosis on CT of the abdomen or pelvis.  I have ordered BMP to check in the morning.  Follow-up urine electrolytes including urine osmolality. Ordered bladder scan Nephrotoxic medications  Hypernatremia,POA: Likely hypovolemic. Ordered D5W at 75  cc/hr after talking to Dr Dolan, dced LR. F/u BMP in am. Encouraged oral intake.   Anion gap Metabolic acidosis, POA: Likely in setting of acute kidney injury.  Improved with intravenous fluids.  Follow-up BMP in the morning  Type 2 diabetes mellitus with hyperglycemia, POA: A1c on 07/13/2024 was 7.7%.  Continue with Semglee  8 units nightly along with sliding scale insulin  and hypoglycemic protocol.  Protein calorie malnutrition, POA: Patient is underweight as evidenced by BMI of 15.  Protein supplement will be provided and dietitian has been consulted.  COPD, POA: Not exacerbation.  Continue with albuterol  and Breztri .  Alzheimer's dementia, POA: Continue with Aricept  and Namenda   Hyperlipidemia, POA: Continue with statin  Hypertension, POA: Continue with amlodipine .  Holding losartan  in the setting of acute kidney injury.  Disposition: Lives at home with her husband.  She has advanced dementia.    DVT prophylaxis: heparin  injection 5,000 Units Start: 10/21/24 0600 SCDs Start: 10/21/24 0404     Code Status: Full Code Family Communication: None at the bedside Status is: Inpatient Remains inpatient appropriate because: Acute metabolic encephalopathy, multifocal pneumonia, UTI, influenza A, AKI    Subjective:  Patient is unable to engage in a meaningful conversation because of the underlying dementia.  She is not requiring any supplemental oxygen.  She lives at home with her husband.  Her husband was recently discharged from the hospital.  She is currently on lactated Ringer 's at 16 cc/h.  Examination:  General exam: Appears calm and comfortable, she has Alzheimer's dementia and she is unable to engage in a meaningful conversation Respiratory system: Clear to auscultation. Respiratory effort normal. Cardiovascular system: S1 & S2 heard,  RRR. No JVD, murmurs, rubs, gallops or clicks. No pedal edema. Gastrointestinal system: Abdomen is nondistended, soft and nontender. No  organomegaly or masses felt. Normal bowel sounds heard. Central nervous system: Alert and oriented x 1. No focal neurological deficits. Extremities: Symmetric 5 x 5 power. Skin: No rashes, lesions or ulcers    Diet Orders (From admission, onward)     Start     Ordered   10/21/24 0405  Diet heart healthy/carb modified Room service appropriate? Yes; Fluid consistency: Thin  Diet effective now       Question Answer Comment  Diet-HS Snack? Nothing   Room service appropriate? Yes   Fluid consistency: Thin      10/21/24 0405            Objective: Vitals:   10/21/24 0600 10/21/24 0700 10/21/24 0800 10/21/24 0833  BP:  130/61 132/75   Pulse: 85 83 86   Resp: (!) 24 (!) 22 (!) 21   Temp:    98 F (36.7 C)  TempSrc:    Axillary  SpO2: 91% 92% 92%   Weight:      Height:        Intake/Output Summary (Last 24 hours) at 10/21/2024 0936 Last data filed at 10/21/2024 0617 Gross per 24 hour  Intake 592.7 ml  Output 700 ml  Net -107.3 ml   Filed Weights   10/20/24 1913 10/21/24 0122  Weight: 39.9 kg 35.5 kg    Scheduled Meds:  amLODipine   10 mg Oral Daily   budesonide -glycopyrrolate -formoterol   2 puff Inhalation BID   Chlorhexidine  Gluconate Cloth  6 each Topical Q0600   dextromethorphan -guaiFENesin   1 tablet Oral BID   donepezil   10 mg Oral QHS   feeding supplement (GLUCERNA SHAKE)  237 mL Oral TID BM   fentaNYL  (SUBLIMAZE ) injection  50 mcg Intravenous Once   heparin   5,000 Units Subcutaneous Q8H   insulin  aspart  0-5 Units Subcutaneous QHS   insulin  aspart  0-9 Units Subcutaneous TID WC   insulin  glargine-yfgn  8 Units Subcutaneous QHS   losartan   100 mg Oral Daily   memantine   10 mg Oral BID   pravastatin   40 mg Oral Daily   Continuous Infusions:  azithromycin  Stopped (10/20/24 2309)   ceFEPime  (MAXIPIME ) IV Stopped (10/20/24 2345)   lactated ringers  50 mL/hr at 10/21/24 0335    Nutritional status     Body mass index is 15.28 kg/m.  Data Reviewed:    CBC: Recent Labs  Lab 10/20/24 1936 10/21/24 0508  WBC 10.1 9.2  NEUTROABS 8.6*  --   HGB 15.5* 12.3  HCT 51.9* 40.2  MCV 88.6 85.5  PLT 147* 166   Basic Metabolic Panel: Recent Labs  Lab 10/20/24 1936 10/21/24 0508  NA 154* 155*  K 4.5 4.1  CL 116* 119*  CO2 15* 21*  GLUCOSE 149* 121*  BUN 112* 105*  CREATININE 4.13* 3.40*  CALCIUM  8.4* 8.1*  MG 3.4* 2.9*  PHOS  --  4.5   GFR: Estimated Creatinine Clearance: 8.4 mL/min (A) (by C-G formula based on SCr of 3.4 mg/dL (H)). Liver Function Tests: Recent Labs  Lab 10/20/24 1936 10/21/24 0508  AST 26 24  ALT 14 15  ALKPHOS 90 85  BILITOT 0.5 0.3  PROT 6.8 5.9*  ALBUMIN  3.2* 3.1*   No results for input(s): LIPASE, AMYLASE in the last 168 hours. No results for input(s): AMMONIA in the last 168 hours. Coagulation Profile: No results for input(s): INR, PROTIME in the  last 168 hours. Cardiac Enzymes: No results for input(s): CKTOTAL, CKMB, CKMBINDEX, TROPONINI in the last 168 hours. BNP (last 3 results) Recent Labs    10/20/24 1936  PROBNP 590.0*   HbA1C: Recent Labs    10/21/24 0508  HGBA1C 8.2*   CBG: Recent Labs  Lab 10/21/24 0721  GLUCAP 107*   Lipid Profile: No results for input(s): CHOL, HDL, LDLCALC, TRIG, CHOLHDL, LDLDIRECT in the last 72 hours. Thyroid  Function Tests: No results for input(s): TSH, T4TOTAL, FREET4, T3FREE, THYROIDAB in the last 72 hours. Anemia Panel: No results for input(s): VITAMINB12, FOLATE, FERRITIN, TIBC, IRON, RETICCTPCT in the last 72 hours. Sepsis Labs: Recent Labs  Lab 10/20/24 1936 10/20/24 2135  LATICACIDVEN 1.9 1.5    Recent Results (from the past 240 hours)  Resp panel by RT-PCR (RSV, Flu A&B, Covid) Anterior Nasal Swab     Status: Abnormal   Collection Time: 10/20/24  7:36 PM   Specimen: Anterior Nasal Swab  Result Value Ref Range Status   SARS Coronavirus 2 by RT PCR NEGATIVE NEGATIVE Final     Comment: (NOTE) SARS-CoV-2 target nucleic acids are NOT DETECTED.  The SARS-CoV-2 RNA is generally detectable in upper respiratory specimens during the acute phase of infection. The lowest concentration of SARS-CoV-2 viral copies this assay can detect is 138 copies/mL. A negative result does not preclude SARS-Cov-2 infection and should not be used as the sole basis for treatment or other patient management decisions. A negative result may occur with  improper specimen collection/handling, submission of specimen other than nasopharyngeal swab, presence of viral mutation(s) within the areas targeted by this assay, and inadequate number of viral copies(<138 copies/mL). A negative result must be combined with clinical observations, patient history, and epidemiological information. The expected result is Negative.  Fact Sheet for Patients:  bloggercourse.com  Fact Sheet for Healthcare Providers:  seriousbroker.it  This test is no t yet approved or cleared by the [place]  FDA and  has been authorized for detection and/or diagnosis of SARS-CoV-2 by FDA under an Emergency Use Authorization (EUA). This EUA will remain  in effect (meaning this test can be used) for the duration of the COVID-19 declaration under Section 564(b)(1) of the Act, 21 U.S.C.section 360bbb-3(b)(1), unless the authorization is terminated  or revoked sooner.       Influenza A by PCR POSITIVE (A) NEGATIVE Final   Influenza B by PCR NEGATIVE NEGATIVE Final    Comment: (NOTE) The Xpert Xpress SARS-CoV-2/FLU/RSV plus assay is intended as an aid in the diagnosis of influenza from Nasopharyngeal swab specimens and should not be used as a sole basis for treatment. Nasal washings and aspirates are unacceptable for Xpert Xpress SARS-CoV-2/FLU/RSV testing.  Fact Sheet for Patients: bloggercourse.com  Fact Sheet for Healthcare  Providers: seriousbroker.it  This test is not yet approved or cleared by the [place]  FDA and has been authorized for detection and/or diagnosis of SARS-CoV-2 by FDA under an Emergency Use Authorization (EUA). This EUA will remain in effect (meaning this test can be used) for the duration of the COVID-19 declaration under Section 564(b)(1) of the Act, 21 U.S.C. section 360bbb-3(b)(1), unless the authorization is terminated or revoked.     Resp Syncytial Virus by PCR NEGATIVE NEGATIVE Final    Comment: (NOTE) Fact Sheet for Patients: bloggercourse.com  Fact Sheet for Healthcare Providers: seriousbroker.it  This test is not yet approved or cleared by the [place]  FDA and has been authorized for detection and/or diagnosis of SARS-CoV-2 by FDA under  an Emergency Use Authorization (EUA). This EUA will remain in effect (meaning this test can be used) for the duration of the COVID-19 declaration under Section 564(b)(1) of the Act, 21 U.S.C. section 360bbb-3(b)(1), unless the authorization is terminated or revoked.  Performed at [facility], [address] [place], [place] [address]   MRSA Next Gen by PCR, Nasal     Status: None   Collection Time: 10/21/24  1:20 AM   Specimen: Nasal Mucosa; Nasal Swab  Result Value Ref Range Status   MRSA by PCR Next Gen NOT DETECTED NOT DETECTED Final    Comment: (NOTE) The GeneXpert MRSA Assay (FDA approved for NASAL specimens only), is one component of a comprehensive MRSA colonization surveillance program. It is not intended to diagnose MRSA infection nor to guide or monitor treatment for MRSA infections. Test performance is not FDA approved in patients less than 43 years old. Performed at [facility], [address] [place], [place] [address]   Culture, blood (routine x 2) Call MD if unable to obtain prior to antibiotics being given     Status: None  (Preliminary result)   Collection Time: 10/21/24  3:16 AM   Specimen: BLOOD  Result Value Ref Range Status   Specimen Description BLOOD BLOOD LEFT ARM  Final   Special Requests   Final    BOTTLES DRAWN AEROBIC AND ANAEROBIC Blood Culture adequate volume   Culture   Final    NO GROWTH < 12 HOURS Performed at [facility], [address] [place], [place] [address]    Report Status PENDING  Incomplete  Culture, blood (routine x 2) Call MD if unable to obtain prior to antibiotics being given     Status: None (Preliminary result)   Collection Time: 10/21/24  3:17 AM   Specimen: BLOOD  Result Value Ref Range Status   Specimen Description BLOOD BLOOD LEFT HAND  Final   Special Requests AEROBIC BOTTLE ONLY Blood Culture adequate volume  Final   Culture   Final    NO GROWTH < 12 HOURS Performed at [facility], [address], [place], [place] [address]    Report Status PENDING  Incomplete         Radiology Studies: CT Cervical Spine Wo Contrast Result Date: 10/20/2024 EXAM: CT CERVICAL SPINE WITHOUT CONTRAST 10/20/2024 08:45:38 PM TECHNIQUE: CT of the cervical spine was performed without the administration of intravenous contrast. Multiplanar reformatted images are provided for review. Automated exposure control, iterative reconstruction, and/or weight based adjustment of the mA/kV was utilized to reduce the radiation dose to as low as reasonably achievable. COMPARISON: 11/23/2023 CLINICAL HISTORY: Neck trauma (Age >= 65y) FINDINGS: BONES AND ALIGNMENT: No acute fracture or traumatic malalignment. DEGENERATIVE CHANGES: Prior ACDF at C6-C7. Diffuse degenerative disc and facet disease. SOFT TISSUES: No prevertebral soft tissue swelling. IMPRESSION: 1. No acute findings. 2. Postoperative and degenerative changes. Electronically signed by: Franky Crease MD MD 10/20/2024 09:08 PM EST RP Workstation: [id number]   CT Head Wo Contrast Result Date: 10/20/2024 EXAM: CT HEAD WITHOUT CONTRAST 10/20/2024  08:45:38 PM TECHNIQUE: CT of the head was performed without the administration of intravenous contrast. Automated exposure control, iterative reconstruction, and/or weight based adjustment of the mA/kV was utilized to reduce the radiation dose to as low as reasonably achievable. COMPARISON: 11/23/2023 CLINICAL HISTORY: Mental status change, unknown cause. FINDINGS: BRAIN AND VENTRICLES: No acute hemorrhage. No evidence of acute infarct. No hydrocephalus. No extra-axial collection. No mass effect or midline shift. Atrophy and chronic small vessel  disease throughout the deep white matter. ORBITS: No acute abnormality. SINUSES: No acute abnormality. SOFT TISSUES AND SKULL: No acute soft tissue abnormality. No skull fracture. IMPRESSION: 1. No acute intracranial abnormality. 2. Atrophy, chronic small vessel disease. Electronically signed by: Franky Crease MD MD 10/20/2024 09:06 PM EST RP Workstation: [id number]   CT CHEST ABDOMEN PELVIS WO CONTRAST Result Date: 10/20/2024 EXAM: CT CHEST, ABDOMEN AND PELVIS WITHOUT CONTRAST 10/20/2024 08:45:38 PM TECHNIQUE: CT of the chest, abdomen and pelvis was performed without the administration of intravenous contrast. Multiplanar reformatted images are provided for review. Automated exposure control, iterative reconstruction, and/or weight based adjustment of the mA/kV was utilized to reduce the radiation dose to as low as reasonably achievable. COMPARISON: Chest radiograph 10/20/2024, CT abdomen and pelvis 12/31/2014, CT chest 08/25/2013, and CT thoracolumbar spine 11/23/2023. CLINICAL HISTORY: Polytrauma, blunt. Weakness and altered mental status. FINDINGS: CHEST: MEDIASTINUM AND LYMPH NODES: Heart and pericardium are unremarkable. Normal heart size. No pericardial effusion. The central airways are clear. No mediastinal, hilar or axillary lymphadenopathy. The thyroid  gland is unremarkable. The esophagus is decompressed. Calcification of the aorta and coronary arteries. No aortic  aneurysm. LUNGS AND PLEURA: Emphysematous changes diffusely throughout the lungs. Bronchiectasis with bronchial wall thickening. Multiple nodular peribronchial infiltrates bilaterally. These changes are likely to represent chronic bronchitis with acute multifocal bronchopneumonia versus aspiration. No pleural effusion or pneumothorax. ABDOMEN AND PELVIS: LIMITATIONS/ARTIFACTS: Streak artifact from arms and hardware-limited evaluation of the abdomen and pelvis. LIVER: As visualized, the liver is unremarkable. GALLBLADDER AND BILE DUCTS: As visualized, the gallbladder is unremarkable. No biliary ductal dilatation. SPLEEN: As visualized, the spleen is unremarkable. PANCREAS: As visualized, the pancreas is unremarkable. ADRENAL GLANDS: As visualized, the adrenal glands are unremarkable. KIDNEYS, URETERS AND BLADDER: As visualized, the kidneys and ureters are unremarkable. No stones in the kidneys or ureters. No hydronephrosis. No perinephric or periureteral stranding. Gas in the bladder. This could arise from catheterization if performed recently or from infection. Correlate with urinalysis. GI AND BOWEL: Stomach, small bowel, and the colon are mostly decompressed. There is no bowel obstruction. REPRODUCTIVE ORGANS: As visualized, the uterus and ovaries are unremarkable. PERITONEUM AND RETROPERITONEUM: No ascites. No free air. VASCULATURE: Aorta is normal in caliber. Dense calcification of the abdominal aorta. No aneurysm. ABDOMINAL AND PELVIS LYMPH NODES: No lymphadenopathy. BONES AND SOFT TISSUES: Postoperative posterior rod and screw fixation from T12 through L4. Anterior fixation of the cervical spine. Degenerative changes throughout the lumbar spine and cervical spine with narrowed interspaces and endplate osteophyte formation. Visualized surgical hardware appears intact. No acute bony abnormalities. Infiltration in the anterior abdominal wall subcutaneous fat consistent with injection sites. Circumscribed cystic  lesion in the left pelvic subcutaneous fat measuring 2.2 cm in diameter, likely a sebaceous cyst. Calcified phleboliths in the pelvis. IMPRESSION: 1. No evidence of acute traumatic injury. 2. Multifocal bronchopneumonia versus aspiration. Chronic bronchitic changes with bronchiectasis. 3. Gas in the urinary bladder, which may be related to recent catheterization or infection; recommend urinalysis. Electronically signed by: Elsie Gravely MD 10/20/2024 08:56 PM EST RP Workstation: [id number]   DG Chest Port 1 View Result Date: 10/20/2024 EXAM: 1 VIEW(S) XRAY OF THE CHEST 10/20/2024 07:52:00 PM COMPARISON: CT of the chest 08/25/2013. CLINICAL HISTORY: Questionable sepsis - evaluate for abnormality. FINDINGS: LUNGS AND PLEURA: Lungs are hyperinflated, likely related to emphysema. A small nodular density projects over the left lower lung measuring 5 mm which is indeterminate. No pleural effusion. No pneumothorax. HEART AND MEDIASTINUM: Atherosclerotic calcifications of the aorta. No acute abnormality  of the cardiac and mediastinal silhouettes. BONES AND SOFT TISSUES: Thoracic and cervical spinal fusion hardware present. No acute osseous abnormality. IMPRESSION: 1. No acute findings. 2. Hyperinflated lungs, likely related to emphysema. 3. Small indeterminate 5 mm nodular density projecting over the left lower lung. Follow-up chest CT recommended. Electronically signed by: Greig Pique MD MD 10/20/2024 07:59 PM EST RP Workstation: [id number]           LOS: 1 day   Time spent= 35 mins    Deliliah Room, MD [facility]  If 7PM-7AM, please contact night-coverage  10/21/2024, 9:36 AM  "

## 2024-10-21 NOTE — Plan of Care (Signed)
" °  Problem: Clinical Measurements: Goal: Respiratory complications will improve Outcome: Progressing Goal: Cardiovascular complication will be avoided Outcome: Progressing   Problem: Respiratory: Goal: Ability to maintain adequate ventilation will improve Outcome: Progressing Goal: Ability to maintain a clear airway will improve Outcome: Progressing   "

## 2024-10-21 NOTE — Plan of Care (Signed)
" °  Problem: Education: Goal: Knowledge of General Education information will improve Description: Including pain rating scale, medication(s)/side effects and non-pharmacologic comfort measures Outcome: Progressing   Problem: Health Behavior/Discharge Planning: Goal: Ability to manage health-related needs will improve Outcome: Progressing   Problem: Clinical Measurements: Goal: Ability to maintain clinical measurements within normal limits will improve Outcome: Progressing Goal: Will remain free from infection Outcome: Progressing Goal: Diagnostic test results will improve Outcome: Progressing Goal: Respiratory complications will improve Outcome: Progressing Goal: Cardiovascular complication will be avoided Outcome: Progressing   Problem: Activity: Goal: Risk for activity intolerance will decrease Outcome: Not Progressing   Problem: Nutrition: Goal: Adequate nutrition will be maintained Outcome: Not Progressing   Problem: Coping: Goal: Level of anxiety will decrease Outcome: Progressing   Problem: Elimination: Goal: Will not experience complications related to bowel motility Outcome: Progressing Goal: Will not experience complications related to urinary retention Outcome: Progressing   Problem: Pain Managment: Goal: General experience of comfort will improve and/or be controlled Outcome: Progressing   Problem: Safety: Goal: Ability to remain free from injury will improve Outcome: Progressing   Problem: Skin Integrity: Goal: Risk for impaired skin integrity will decrease Outcome: Progressing   Problem: Clinical Measurements: Goal: Ability to maintain a body temperature in the normal range will improve Outcome: Progressing   Problem: Respiratory: Goal: Ability to maintain adequate ventilation will improve Outcome: Progressing Goal: Ability to maintain a clear airway will improve Outcome: Progressing   Problem: Education: Goal: Ability to describe self-care  measures that may prevent or decrease complications (Diabetes Survival Skills Education) will improve Outcome: Progressing Goal: Individualized Educational Video(s) Outcome: Progressing   Problem: Coping: Goal: Ability to adjust to condition or change in health will improve Outcome: Progressing   Problem: Fluid Volume: Goal: Ability to maintain a balanced intake and output will improve Outcome: Progressing   Problem: Health Behavior/Discharge Planning: Goal: Ability to identify and utilize available resources and services will improve Outcome: Progressing Goal: Ability to manage health-related needs will improve Outcome: Progressing   Problem: Metabolic: Goal: Ability to maintain appropriate glucose levels will improve Outcome: Progressing   Problem: Nutritional: Goal: Maintenance of adequate nutrition will improve Outcome: Not Progressing Goal: Progress toward achieving an optimal weight will improve Outcome: Not Progressing   Problem: Skin Integrity: Goal: Risk for impaired skin integrity will decrease Outcome: Progressing   Problem: Tissue Perfusion: Goal: Adequacy of tissue perfusion will improve Outcome: Progressing   "

## 2024-10-22 DIAGNOSIS — J188 Other pneumonia, unspecified organism: Secondary | ICD-10-CM | POA: Diagnosis not present

## 2024-10-22 LAB — BASIC METABOLIC PANEL WITH GFR
Anion gap: 11 (ref 5–15)
Anion gap: 14 (ref 5–15)
Anion gap: 12 (ref 5–15)
BUN: 73 mg/dL — ABNORMAL HIGH (ref 8–23)
BUN: 62 mg/dL — ABNORMAL HIGH (ref 8–23)
BUN: 46 mg/dL — ABNORMAL HIGH (ref 8–23)
CO2: 23 mmol/L (ref 22–32)
CO2: 20 mmol/L — ABNORMAL LOW (ref 22–32)
CO2: 23 mmol/L (ref 22–32)
Calcium: 8.1 mg/dL — ABNORMAL LOW (ref 8.9–10.3)
Calcium: 8.3 mg/dL — ABNORMAL LOW (ref 8.9–10.3)
Calcium: 8.3 mg/dL — ABNORMAL LOW (ref 8.9–10.3)
Chloride: 119 mmol/L — ABNORMAL HIGH (ref 98–111)
Chloride: 116 mmol/L — ABNORMAL HIGH (ref 98–111)
Chloride: 109 mmol/L (ref 98–111)
Creatinine, Ser: 2.22 mg/dL — ABNORMAL HIGH (ref 0.44–1.00)
Creatinine, Ser: 2.1 mg/dL — ABNORMAL HIGH (ref 0.44–1.00)
Creatinine, Ser: 1.84 mg/dL — ABNORMAL HIGH (ref 0.44–1.00)
GFR, Estimated: 23 mL/min — ABNORMAL LOW
GFR, Estimated: 24 mL/min — ABNORMAL LOW
GFR, Estimated: 29 mL/min — ABNORMAL LOW
Glucose, Bld: 307 mg/dL — ABNORMAL HIGH (ref 70–99)
Glucose, Bld: 297 mg/dL — ABNORMAL HIGH (ref 70–99)
Glucose, Bld: 412 mg/dL — ABNORMAL HIGH (ref 70–99)
Potassium: 3.7 mmol/L (ref 3.5–5.1)
Potassium: 3.7 mmol/L (ref 3.5–5.1)
Potassium: 3.7 mmol/L (ref 3.5–5.1)
Sodium: 153 mmol/L — ABNORMAL HIGH (ref 135–145)
Sodium: 151 mmol/L — ABNORMAL HIGH (ref 135–145)
Sodium: 144 mmol/L (ref 135–145)

## 2024-10-22 LAB — GLUCOSE, CAPILLARY
Glucose-Capillary: 324 mg/dL — ABNORMAL HIGH (ref 70–99)
Glucose-Capillary: 297 mg/dL — ABNORMAL HIGH (ref 70–99)
Glucose-Capillary: 298 mg/dL — ABNORMAL HIGH (ref 70–99)
Glucose-Capillary: 338 mg/dL — ABNORMAL HIGH (ref 70–99)

## 2024-10-22 LAB — LEGIONELLA PNEUMOPHILA SEROGP 1 UR AG: L. pneumophila Serogp 1 Ur Ag: NEGATIVE

## 2024-10-22 MED ORDER — INSULIN GLARGINE-YFGN 100 UNIT/ML ~~LOC~~ SOLN: 15.0000 [IU] | Freq: Every day | SUBCUTANEOUS | Status: DC

## 2024-10-22 MED ORDER — SODIUM CHLORIDE 0.9 % IV SOLN
1.0000 g | INTRAVENOUS | Status: DC
  Administered 2024-10-22 – 2024-10-23 (×2): 1 g via INTRAVENOUS
  Filled 2024-10-22 (×2): qty 10

## 2024-10-22 MED ORDER — INSULIN GLARGINE 100 UNIT/ML ~~LOC~~ SOLN
15.0000 [IU] | Freq: Every day | SUBCUTANEOUS | Status: DC
  Administered 2024-10-22 – 2024-10-23 (×2): 15 [IU] via SUBCUTANEOUS
  Filled 2024-10-22 (×3): qty 0.15

## 2024-10-22 MED ORDER — OSELTAMIVIR PHOSPHATE 30 MG PO CAPS
30.0000 mg | ORAL_CAPSULE | Freq: Every day | ORAL | Status: AC
  Administered 2024-10-22 – 2024-10-25 (×4): 30 mg via ORAL
  Filled 2024-10-22 (×4): qty 1

## 2024-10-22 NOTE — TOC Initial Note (Signed)
 Transition of Care [facility]) - Initial/Assessment Note    Patient Details  Name: Jamie [name] MRN: [id number] Date of Birth: 18-Jan-1951  Transition of Care [facility]) CM/SW Contact:    Hoy DELENA Bigness, LCSW Phone Number: 10/22/2024, 8:59 AM  Clinical Narrative:                 Pt assessed due to high risk for readmission. Pt lives at home with her spouse. Pt's spouse assists with ADL's, medication assistance, and transportation. Pt has RW at home she uses when she ambulates but, does not ambulate often. Pt's spouse is currently sick with the flu himself and has been confused/weak per pt's step daughter. ICM will continue to follow.   Expected Discharge Plan: Home/Self Care Barriers to Discharge: Continued Medical Work up   Patient Goals and CMS Choice Patient states their goals for this hospitalization and ongoing recovery are:: For pt to be able to return home          Expected Discharge Plan and Services In-house Referral: Clinical Social Work Discharge Planning Services: NA Post Acute Care Choice: Home Health Living arrangements for the past 2 months: Single Family Home                                      Prior Living Arrangements/Services Living arrangements for the past 2 months: Single Family Home Lives with:: Spouse Patient language and need for interpreter reviewed:: Yes Do you feel safe going back to the place where you live?: Yes      Need for Family Participation in Patient Care: Yes (Comment) Care giver support system in place?: Yes (comment) Current home services: DME (RW) Criminal Activity/Legal Involvement Pertinent to Current Situation/Hospitalization: No - Comment as needed  Activities of Daily Living   ADL Screening (condition at time of admission) Independently performs ADLs?: No Does the patient have a NEW difficulty with bathing/dressing/toileting/self-feeding that is expected to last >3 days?: No Does the patient have a NEW difficulty with  getting in/out of bed, walking, or climbing stairs that is expected to last >3 days?: No Does the patient have a NEW difficulty with communication that is expected to last >3 days?: No Is the patient deaf or have difficulty hearing?: Yes Does the patient have difficulty seeing, even when wearing glasses/contacts?: No Does the patient have difficulty concentrating, remembering, or making decisions?: Yes  Permission Sought/Granted Permission sought to share information with : Family Supports Permission granted to share information with : Yes, Verbal Permission Granted              Emotional Assessment   Attitude/Demeanor/Rapport: Unable to Assess Affect (typically observed): Unable to Assess Orientation: : Oriented to Self Alcohol / Substance Use: Not Applicable Psych Involvement: No (comment)  Admission diagnosis:  Influenza A [J10.1] AKI (acute kidney injury) [N17.9] Urinary tract infection with hematuria, site unspecified [N39.0, R31.9] Multifocal pneumonia [J18.8] Patient Active Problem List   Diagnosis Date Noted   Protein-calorie malnutrition, severe 10/21/2024   Multifocal pneumonia 10/20/2024   Diabetes mellitus due to underlying condition with hypoglycemia (HCC) 11/24/2023   Rhabdomyolysis 11/24/2023   Elevated troponin 11/24/2023   Hypokalemia 11/24/2023   Hypoglycemia 11/23/2023   Moderate early onset Alzheimer's dementia with agitation (HCC) 11/07/2022   Essential hypertension, benign 08/10/2019   Postural kyphosis of lumbar region 02/23/2019   Tobacco abuse counseling 09/09/2018   Lumbar radiculopathy, chronic 02/03/2017   DM  type 2 causing vascular disease (HCC)    Chronic back pain 10/03/2016   Underweight 05/02/2016   GERD (gastroesophageal reflux disease) 02/13/2015   Hard of hearing 02/10/2014   Hyperlipidemia associated with type 2 diabetes mellitus (HCC)    Vitamin D  deficiency    Coronary artery calcification 08/31/2013   Lung nodule, right upper  lobe 8 mm March 2014 02/04/2013   COPD (chronic obstructive pulmonary disease) (HCC) 02/04/2013   Current smoker 02/04/2013   PCP:  Lavell Bari LABOR, FNP Pharmacy:   [facility] [place], [place] - [address] [place] [address] Phone: [phone] Fax: [phone]  [facility], [place] - [address] [place] [address] Phone: [phone] Fax: [phone]     Social Drivers of Health (SDOH) Social History: SDOH Screenings   Food Insecurity: Patient Unable To Answer (10/21/2024)  Housing: Unknown (10/21/2024)  Transportation Needs: Patient Unable To Answer (10/21/2024)  Utilities: Patient Unable To Answer (10/21/2024)  Alcohol Screen: Low Risk (12/30/2022)  Depression (PHQ2-9): Low Risk (07/13/2024)  Financial Resource Strain: Low Risk (12/30/2022)  Physical Activity: Insufficiently Active (12/30/2022)  Social Connections: Patient Unable To Answer (10/21/2024)  Stress: No Stress Concern Present (12/30/2022)  Tobacco Use: High Risk (10/21/2024)   SDOH Interventions:     Readmission Risk Interventions    10/22/2024    8:57 AM 11/24/2023   10:32 AM  Readmission Risk Prevention Plan  Transportation Screening Complete Complete  PCP or Specialist Appt within 3-5 Days Complete   Home Care Screening  Complete  Medication Review (RN CM)  Complete  HRI or Home Care Consult Complete   Social Work Consult for Recovery Care Planning/Counseling Complete   Palliative Care Screening Not Applicable   Medication Review Oceanographer) Complete

## 2024-10-22 NOTE — Inpatient Diabetes Management (Signed)
 Inpatient Diabetes Program Recommendations  AACE/ADA: New Consensus Statement on Inpatient Glycemic Control (2015)  Target Ranges:  Prepandial:   less than 140 mg/dL      Peak postprandial:   less than 180 mg/dL (1-2 hours)      Critically ill patients:  140 - 180 mg/dL   Lab Results  Component Value Date   GLUCAP 324 (H) 10/22/2024   HGBA1C 8.2 (H) 10/21/2024    Diabetes history: DM2 Outpatient Diabetes medications:  Lantus  18 units Current orders for Inpatient glycemic control: Semglee  8 units daily Novolog  0-9 units tid, 0-5 units hs  Inpatient Diabetes Program Recommendations:   Fasting 324.  Please consider: -Increase Semglee  to 15 units nightly  Thank you, Shavonte Zhao E. Florencio Hollibaugh, RN, MSN, CNS, CDCES  Diabetes Coordinator Inpatient Glycemic Control Team Team Pager [phone] (8am-5pm) 10/22/2024 10:29 AM

## 2024-10-22 NOTE — Plan of Care (Signed)
 " Problem: Education: Goal: Knowledge of General Education information will improve Description: Including pain rating scale, medication(s)/side effects and non-pharmacologic comfort measures Outcome: Progressing   Problem: Health Behavior/Discharge Planning: Goal: Ability to manage health-related needs will improve Outcome: Progressing   Problem: Clinical Measurements: Goal: Ability to maintain clinical measurements within normal limits will improve Outcome: Progressing Goal: Will remain free from infection Outcome: Progressing Goal: Diagnostic test results will improve Outcome: Progressing Goal: Respiratory complications will improve Outcome: Progressing Goal: Cardiovascular complication will be avoided Outcome: Progressing   Problem: Activity: Goal: Risk for activity intolerance will decrease Outcome: Progressing   Problem: Nutrition: Goal: Adequate nutrition will be maintained Outcome: Progressing   Problem: Coping: Goal: Level of anxiety will decrease Outcome: Progressing   Problem: Elimination: Goal: Will not experience complications related to bowel motility Outcome: Progressing Goal: Will not experience complications related to urinary retention Outcome: Progressing   Problem: Pain Managment: Goal: General experience of comfort will improve and/or be controlled Outcome: Progressing   Problem: Skin Integrity: Goal: Risk for impaired skin integrity will decrease Outcome: Progressing   Problem: Activity: Goal: Ability to tolerate increased activity will improve Outcome: Progressing   Problem: Clinical Measurements: Goal: Ability to maintain a body temperature in the normal range will improve Outcome: Progressing   Problem: Respiratory: Goal: Ability to maintain adequate ventilation will improve Outcome: Progressing Goal: Ability to maintain a clear airway will improve Outcome: Progressing   Problem: Coping: Goal: Ability to adjust to condition or  change in health will improve Outcome: Progressing   Problem: Fluid Volume: Goal: Ability to maintain a balanced intake and output will improve Outcome: Progressing   Problem: Metabolic: Goal: Ability to maintain appropriate glucose levels will improve Outcome: Progressing   Problem: Nutritional: Goal: Maintenance of adequate nutrition will improve Outcome: Progressing Goal: Progress toward achieving an optimal weight will improve Outcome: Progressing   Problem: Skin Integrity: Goal: Risk for impaired skin integrity will decrease Outcome: Progressing   Problem: Tissue Perfusion: Goal: Adequacy of tissue perfusion will improve Outcome: Progressing   Problem: Education: Goal: Knowledge of General Education information will improve Description: Including pain rating scale, medication(s)/side effects and non-pharmacologic comfort measures Outcome: Progressing   Problem: Health Behavior/Discharge Planning: Goal: Ability to manage health-related needs will improve Outcome: Progressing   Problem: Clinical Measurements: Goal: Ability to maintain clinical measurements within normal limits will improve Outcome: Progressing Goal: Will remain free from infection Outcome: Progressing Goal: Diagnostic test results will improve Outcome: Progressing Goal: Respiratory complications will improve Outcome: Progressing Goal: Cardiovascular complication will be avoided Outcome: Progressing   Problem: Activity: Goal: Risk for activity intolerance will decrease Outcome: Progressing   Problem: Nutrition: Goal: Adequate nutrition will be maintained Outcome: Progressing   Problem: Coping: Goal: Level of anxiety will decrease Outcome: Progressing   Problem: Elimination: Goal: Will not experience complications related to bowel motility Outcome: Progressing Goal: Will not experience complications related to urinary retention Outcome: Progressing   Problem: Pain Managment: Goal:  General experience of comfort will improve and/or be controlled Outcome: Progressing   Problem: Safety: Goal: Ability to remain free from injury will improve Outcome: Progressing   Problem: Skin Integrity: Goal: Risk for impaired skin integrity will decrease Outcome: Progressing   Problem: Activity: Goal: Ability to tolerate increased activity will improve Outcome: Progressing   Problem: Clinical Measurements: Goal: Ability to maintain a body temperature in the normal range will improve Outcome: Progressing   Problem: Respiratory: Goal: Ability to maintain adequate ventilation will improve Outcome: Progressing Goal: Ability to  maintain a clear airway will improve Outcome: Progressing   Problem: Education: Goal: Ability to describe self-care measures that may prevent or decrease complications (Diabetes Survival Skills Education) will improve Outcome: Progressing Goal: Individualized Educational Video(s) Outcome: Progressing   Problem: Coping: Goal: Ability to adjust to condition or change in health will improve Outcome: Progressing   Problem: Fluid Volume: Goal: Ability to maintain a balanced intake and output will improve Outcome: Progressing   Problem: Health Behavior/Discharge Planning: Goal: Ability to identify and utilize available resources and services will improve Outcome: Progressing Goal: Ability to manage health-related needs will improve Outcome: Progressing   Problem: Metabolic: Goal: Ability to maintain appropriate glucose levels will improve Outcome: Progressing   Problem: Nutritional: Goal: Maintenance of adequate nutrition will improve Outcome: Progressing Goal: Progress toward achieving an optimal weight will improve Outcome: Progressing   Problem: Skin Integrity: Goal: Risk for impaired skin integrity will decrease Outcome: Progressing   Problem: Tissue Perfusion: Goal: Adequacy of tissue perfusion will improve Outcome: Progressing   "

## 2024-10-22 NOTE — Plan of Care (Signed)
" °  Problem: Safety: Goal: Ability to remain free from injury will improve Outcome: Progressing   Problem: Skin Integrity: Goal: Risk for impaired skin integrity will decrease Outcome: Progressing   Problem: Nutritional: Goal: Maintenance of adequate nutrition will improve Outcome: Progressing Goal: Progress toward achieving an optimal weight will improve Outcome: Progressing   "

## 2024-10-22 NOTE — Progress Notes (Signed)
 " PROGRESS NOTE  Jamie [name]  FMW:[id number] DOB: October 13, 1951 DOA: 10/20/2024 PCP: Lavell Bari LABOR, FNP  Consultants  Brief Narrative: 74 y.o. female with medical history significant of hypertension, hyperlipidemia, T2DM, COPD, dementia who presents to the emergency department from home via EMS due to generalized weakness.  Daughter came to visit and noticed that she is weaker and more confused than usual so she called EMS. On arrival in the emergency room, patient was found to be hypotensive which improved with intravenous fluids.  Of the chest abdomen pelvis without contrast showed no evidence of acute traumatic injury but did show multifocal bronchopneumonia versus aspiration.  She received IV azithromycin  and cefepime  along with intravenous fluids in the ED and TRH was activated called to admit the patient.   She lives at home with her husband.  She has advanced dementia.   Assessment & Plan:  Principal Problem:   Multifocal pneumonia   Multifocal bacterial community-acquired pneumonia, POA: in the setting of Influenza A infection.  CT of the chest showed evidence of multifocal pneumonia.   - Continue with intravenous cefepime  and azithromycin .  Follow-up blood cultures, urine Legionella and pneumococcal antigens.  Continue with Tylenol  as needed for fever as well as Mucinex , incentive spirometry and flutter valve. MRSA PCR is negative  Hypernatremia,POA: Likely hypovolemic.  -Initially on D5W at 75 cc/hr, increased today to 125 cc/hr as her sodium was not correcting very well. - Recheck this afternoon shows improving sodium.  Also improving creatinine, see below.  Encourage oral intake, although does not eat much secondary to her dementia..    Influenza A, POA: Continue with symptomatic treatment.  Tamiflu  initiated at 30 mg Q48H x 5 days (renally adjusted dose).   Acute kidney injury, POA: Creatinine of 4.13 on admission.  Likely prerenal in the setting of dehydration.  Creatinine has  improved to 2.1 today.  Continue IV hydration as above.     Anion gap Metabolic acidosis, POA: Likely in setting of acute kidney injury.  Improved with intravenous fluids.     Type 2 diabetes mellitus with hyperglycemia, POA: A1c on 07/13/2024 was 7.7%.  Continue with Semglee  8 units nightly along with sliding scale insulin  and hypoglycemic protocol--> increase to 15 units in light of ongoing IV D5W   Protein calorie malnutrition, POA: Patient is underweight as evidenced by BMI of 15.  Protein supplement will be provided and dietitian has been consulted.   COPD, POA: Not exacerbation.  Continue with albuterol  and Breztri .   Alzheimer's dementia, POA: Continue with Aricept  and Namenda    Hyperlipidemia, POA: Continue with statin   Hypertension, POA: Continue with amlodipine .  Holding losartan  in the setting of acute kidney injury.   Disposition: Lives at home with her husband.  She has advanced dementia. Nutrition Problem: Severe Malnutrition Etiology: chronic illness (COPD) Signs/Symptoms: severe muscle depletion, severe fat depletion, percent weight loss (11% weight loss within 6 months) Percent weight loss: 11 % Interventions: Ensure Enlive (each supplement provides 350kcal and 20 grams of protein), Magic cup, MVI, Liberalize Diet   DVT prophylaxis:  heparin  injection 5,000 Units Start: 10/21/24 0600 SCDs Start: 10/21/24 0404  Code Status:   Code Status: Full Code Level of care: Telemetry Status is: Inpatient   Subjective: Pt sleeping when I entered room.  No complaints or concerns.  Not very hungry  Objective: Vitals:   10/21/24 1924 10/21/24 2357 10/22/24 0355 10/22/24 1249  BP: 113/68 121/82 128/85 (!) 128/94  Pulse: 83 85 86 78  Resp: 20  16 18   Temp: 98.1 F (36.7 C) 98.1 F (36.7 C) 98 F (36.7 C) 97.8 F (36.6 C)  TempSrc: Oral Axillary Axillary Oral  SpO2: 94% 94% 93% 95%  Weight:      Height:        Intake/Output Summary (Last 24 hours) at 10/22/2024  1638 Last data filed at 10/22/2024 0357 Gross per 24 hour  Intake --  Output 1600 ml  Net -1600 ml   Filed Weights   10/20/24 1913 10/21/24 0122  Weight: 39.9 kg 35.5 kg   Body mass index is 15.28 kg/m.  Gen: 74 y.o. female in no apparent distress.  Nontoxic, thin, frail-appearing, elderly female Pulm: Non-labored breathing.  Clear to auscultation bilaterally.  CV: Regular rate and rhythm.  GI: Abdomen soft, non-tender, non-distended, with normoactive bowel sounds. No organomegaly or masses felt. Ext: Warm, no deformities,  Skin: No rashes, lesions  Neuro: Alert, oriented only to person. No notable focal neurological deficits. Psych: Calm  Judgement and insight appear normal. Mood & affect appropriate.     I have personally reviewed the following labs and images: CBC: Recent Labs  Lab 10/20/24 1936 10/21/24 0508  WBC 10.1 9.2  NEUTROABS 8.6*  --   HGB 15.5* 12.3  HCT 51.9* 40.2  MCV 88.6 85.5  PLT 147* 166   BMP &GFR Recent Labs  Lab 10/20/24 1936 10/21/24 0508 10/22/24 0457 10/22/24 1231  NA 154* 155* 153* 151*  K 4.5 4.1 3.7 3.7  CL 116* 119* 119* 116*  CO2 15* 21* 23 20*  GLUCOSE 149* 121* 307* 297*  BUN 112* 105* 73* 62*  CREATININE 4.13* 3.40* 2.22* 2.10*  CALCIUM  8.4* 8.1* 8.1* 8.3*  MG 3.4* 2.9*  --   --   PHOS  --  4.5  --   --    Estimated Creatinine Clearance: 13.6 mL/min (A) (by C-G formula based on SCr of 2.1 mg/dL (H)). Liver & Pancreas: Recent Labs  Lab 10/20/24 1936 10/21/24 0508  AST 26 24  ALT 14 15  ALKPHOS 90 85  BILITOT 0.5 0.3  PROT 6.8 5.9*  ALBUMIN  3.2* 3.1*   No results for input(s): LIPASE, AMYLASE in the last 168 hours. No results for input(s): AMMONIA in the last 168 hours. Diabetic: Recent Labs    10/21/24 0508  HGBA1C 8.2*   Recent Labs  Lab 10/21/24 1622 10/21/24 2212 10/22/24 0744 10/22/24 1114 10/22/24 1625  GLUCAP 143* 186* 324* 297* 298*   Cardiac Enzymes: No results for input(s): CKTOTAL,  CKMB, CKMBINDEX, TROPONINI in the last 168 hours. Recent Labs    10/20/24 1936  PROBNP 590.0*   Coagulation Profile: No results for input(s): INR, PROTIME in the last 168 hours. Thyroid  Function Tests: No results for input(s): TSH, T4TOTAL, FREET4, T3FREE, THYROIDAB in the last 72 hours. Lipid Profile: No results for input(s): CHOL, HDL, LDLCALC, TRIG, CHOLHDL, LDLDIRECT in the last 72 hours. Anemia Panel: No results for input(s): VITAMINB12, FOLATE, FERRITIN, TIBC, IRON, RETICCTPCT in the last 72 hours. Urine analysis:    Component Value Date/Time   COLORURINE AMBER (A) 10/20/2024 1936   APPEARANCEUR CLOUDY (A) 10/20/2024 1936   APPEARANCEUR Cloudy (A) 08/20/2023 0839   LABSPEC 1.014 10/20/2024 1936   PHURINE 5.0 10/20/2024 1936   GLUCOSEU NEGATIVE 10/20/2024 1936   HGBUR SMALL (A) 10/20/2024 1936   BILIRUBINUR NEGATIVE 10/20/2024 1936   BILIRUBINUR Negative 08/20/2023 0839   KETONESUR NEGATIVE 10/20/2024 1936   PROTEINUR 100 (A) 10/20/2024 1936   UROBILINOGEN negative  01/11/2015 1442   UROBILINOGEN 0.2 12/31/2014 0409   NITRITE NEGATIVE 10/20/2024 1936   LEUKOCYTESUR LARGE (A) 10/20/2024 1936   Sepsis Labs: Invalid input(s): PROCALCITONIN, LACTICIDVEN  Microbiology: Recent Results (from the past 240 hours)  Resp panel by RT-PCR (RSV, Flu A&B, Covid) Anterior Nasal Swab     Status: Abnormal   Collection Time: 10/20/24  7:36 PM   Specimen: Anterior Nasal Swab  Result Value Ref Range Status   SARS Coronavirus 2 by RT PCR NEGATIVE NEGATIVE Final    Comment: (NOTE) SARS-CoV-2 target nucleic acids are NOT DETECTED.  The SARS-CoV-2 RNA is generally detectable in upper respiratory specimens during the acute phase of infection. The lowest concentration of SARS-CoV-2 viral copies this assay can detect is 138 copies/mL. A negative result does not preclude SARS-Cov-2 infection and should not be used as the sole basis for  treatment or other patient management decisions. A negative result may occur with  improper specimen collection/handling, submission of specimen other than nasopharyngeal swab, presence of viral mutation(s) within the areas targeted by this assay, and inadequate number of viral copies(<138 copies/mL). A negative result must be combined with clinical observations, patient history, and epidemiological information. The expected result is Negative.  Fact Sheet for Patients:  bloggercourse.com  Fact Sheet for Healthcare Providers:  seriousbroker.it  This test is no t yet approved or cleared by the [place]  FDA and  has been authorized for detection and/or diagnosis of SARS-CoV-2 by FDA under an Emergency Use Authorization (EUA). This EUA will remain  in effect (meaning this test can be used) for the duration of the COVID-19 declaration under Section 564(b)(1) of the Act, 21 U.S.C.section 360bbb-3(b)(1), unless the authorization is terminated  or revoked sooner.       Influenza A by PCR POSITIVE (A) NEGATIVE Final   Influenza B by PCR NEGATIVE NEGATIVE Final    Comment: (NOTE) The Xpert Xpress SARS-CoV-2/FLU/RSV plus assay is intended as an aid in the diagnosis of influenza from Nasopharyngeal swab specimens and should not be used as a sole basis for treatment. Nasal washings and aspirates are unacceptable for Xpert Xpress SARS-CoV-2/FLU/RSV testing.  Fact Sheet for Patients: bloggercourse.com  Fact Sheet for Healthcare Providers: seriousbroker.it  This test is not yet approved or cleared by the [place]  FDA and has been authorized for detection and/or diagnosis of SARS-CoV-2 by FDA under an Emergency Use Authorization (EUA). This EUA will remain in effect (meaning this test can be used) for the duration of the COVID-19 declaration under Section 564(b)(1) of the Act, 21  U.S.C. section 360bbb-3(b)(1), unless the authorization is terminated or revoked.     Resp Syncytial Virus by PCR NEGATIVE NEGATIVE Final    Comment: (NOTE) Fact Sheet for Patients: bloggercourse.com  Fact Sheet for Healthcare Providers: seriousbroker.it  This test is not yet approved or cleared by the [place]  FDA and has been authorized for detection and/or diagnosis of SARS-CoV-2 by FDA under an Emergency Use Authorization (EUA). This EUA will remain in effect (meaning this test can be used) for the duration of the COVID-19 declaration under Section 564(b)(1) of the Act, 21 U.S.C. section 360bbb-3(b)(1), unless the authorization is terminated or revoked.  Performed at [facility], [address], [place], [place] [address]   Urine Culture     Status: Abnormal (Preliminary result)   Collection Time: 10/20/24  7:36 PM   Specimen: Urine, Random  Result Value Ref Range Status   Specimen Description   Final    URINE, RANDOM  Performed at [facility], [address], [name], [place] [address]    Special Requests   Final    NONE Reflexed from [id number] Performed at [facility], [address] [place], [place] [address]    Culture >=100,000 COLONIES/mL ESCHERICHIA COLI (A)  Final   Report Status PENDING  Incomplete  MRSA Next Gen by PCR, Nasal     Status: None   Collection Time: 10/21/24  1:20 AM   Specimen: Nasal Mucosa; Nasal Swab  Result Value Ref Range Status   MRSA by PCR Next Gen NOT DETECTED NOT DETECTED Final    Comment: (NOTE) The GeneXpert MRSA Assay (FDA approved for NASAL specimens only), is one component of a comprehensive MRSA colonization surveillance program. It is not intended to diagnose MRSA infection nor to guide or monitor treatment for MRSA infections. Test performance is not FDA approved in patients less than 65 years old. Performed at [facility], [address], [place], [place] [address]    Culture, blood (routine x 2) Call MD if unable to obtain prior to antibiotics being given     Status: None (Preliminary result)   Collection Time: 10/21/24  3:16 AM   Specimen: BLOOD  Result Value Ref Range Status   Specimen Description BLOOD BLOOD LEFT ARM  Final   Special Requests   Final    BOTTLES DRAWN AEROBIC AND ANAEROBIC Blood Culture adequate volume   Culture   Final    NO GROWTH 1 DAY Performed at [facility], [address], [place], [place] [address]    Report Status PENDING  Incomplete  Culture, blood (routine x 2) Call MD if unable to obtain prior to antibiotics being given     Status: None (Preliminary result)   Collection Time: 10/21/24  3:17 AM   Specimen: BLOOD  Result Value Ref Range Status   Specimen Description BLOOD BLOOD LEFT HAND  Final   Special Requests AEROBIC BOTTLE ONLY Blood Culture adequate volume  Final   Culture   Final    NO GROWTH 1 DAY Performed at [facility], [address], [place], [place] [address]    Report Status PENDING  Incomplete    Radiology Studies: No results found.  Scheduled Meds:  amLODipine   10 mg Oral Daily   budesonide -glycopyrrolate -formoterol   2 puff Inhalation BID   Chlorhexidine  Gluconate Cloth  6 each Topical Q0600   dextromethorphan -guaiFENesin   1 tablet Oral BID   donepezil   10 mg Oral QHS   doxycycline   100 mg Oral Q12H   feeding supplement  237 mL Oral BID BM   fentaNYL  (SUBLIMAZE ) injection  50 mcg Intravenous Once   heparin   5,000 Units Subcutaneous Q8H   insulin  aspart  0-5 Units Subcutaneous QHS   insulin  aspart  0-9 Units Subcutaneous TID WC   insulin  glargine  15 Units Subcutaneous QHS   memantine   10 mg Oral BID   multivitamin with minerals  1 tablet Oral Daily   oseltamivir   30 mg Oral Daily   pravastatin   40 mg Oral Daily   Continuous Infusions:  cefTRIAXone  (ROCEPHIN )  IV       LOS: 2 days   35 minutes with more than 50% spent in reviewing records, counseling patient/family and coordinating  care.  Reyes VEAR Gaw, MD [facility] [email or web address] 10/22/2024, 4:38 PM    "

## 2024-10-23 DIAGNOSIS — J188 Other pneumonia, unspecified organism: Secondary | ICD-10-CM | POA: Diagnosis not present

## 2024-10-23 LAB — BASIC METABOLIC PANEL WITH GFR
Anion gap: 6 (ref 5–15)
BUN: 35 mg/dL — ABNORMAL HIGH (ref 8–23)
CO2: 30 mmol/L (ref 22–32)
Calcium: 8.3 mg/dL — ABNORMAL LOW (ref 8.9–10.3)
Chloride: 109 mmol/L (ref 98–111)
Creatinine, Ser: 1.51 mg/dL — ABNORMAL HIGH (ref 0.44–1.00)
GFR, Estimated: 36 mL/min — ABNORMAL LOW
Glucose, Bld: 86 mg/dL (ref 70–99)
Potassium: 3.3 mmol/L — ABNORMAL LOW (ref 3.5–5.1)
Sodium: 145 mmol/L (ref 135–145)

## 2024-10-23 LAB — CBC
HCT: 39.3 % (ref 36.0–46.0)
Hemoglobin: 12.2 g/dL (ref 12.0–15.0)
MCH: 26.4 pg (ref 26.0–34.0)
MCHC: 31 g/dL (ref 30.0–36.0)
MCV: 85.1 fL (ref 80.0–100.0)
Platelets: 174 K/uL (ref 150–400)
RBC: 4.62 MIL/uL (ref 3.87–5.11)
RDW: 13.9 % (ref 11.5–15.5)
WBC: 6.1 K/uL (ref 4.0–10.5)
nRBC: 0 % (ref 0.0–0.2)

## 2024-10-23 LAB — GLUCOSE, CAPILLARY
Glucose-Capillary: 57 mg/dL — ABNORMAL LOW (ref 70–99)
Glucose-Capillary: 110 mg/dL — ABNORMAL HIGH (ref 70–99)
Glucose-Capillary: 117 mg/dL — ABNORMAL HIGH (ref 70–99)
Glucose-Capillary: 101 mg/dL — ABNORMAL HIGH (ref 70–99)
Glucose-Capillary: 175 mg/dL — ABNORMAL HIGH (ref 70–99)

## 2024-10-23 LAB — URINE CULTURE: Culture: >=100000 — AB

## 2024-10-23 MED ORDER — DM-GUAIFENESIN ER 30-600 MG PO TB12
1.0000 | ORAL_TABLET | Freq: Two times a day (BID) | ORAL | Status: DC | PRN
  Administered 2024-10-23: 1 via ORAL

## 2024-10-23 MED ORDER — POTASSIUM CHLORIDE CRYS ER 20 MEQ PO TBCR
40.0000 meq | EXTENDED_RELEASE_TABLET | Freq: Once | ORAL | Status: AC
  Administered 2024-10-23: 40 meq via ORAL
  Filled 2024-10-23: qty 2

## 2024-10-23 NOTE — Plan of Care (Signed)
  Problem: Clinical Measurements: Goal: Cardiovascular complication will be avoided Outcome: Progressing   Problem: Safety: Goal: Ability to remain free from injury will improve Outcome: Progressing   

## 2024-10-23 NOTE — Progress Notes (Addendum)
 " PROGRESS NOTE  Jamie [name]  FMW:[id number] DOB: 12-24-1950 DOA: 10/20/2024 PCP: Lavell Bari LABOR, FNP  Consultants  Brief Narrative: 74 y.o. female with medical history significant of hypertension, hyperlipidemia, T2DM, COPD, dementia who presents to the emergency department from home via EMS due to generalized weakness.  Daughter came to visit and noticed that she is weaker and more confused than usual so she called EMS. On arrival in the emergency room, patient was found to be hypotensive which improved with intravenous fluids.  Of the chest abdomen pelvis without contrast showed no evidence of acute traumatic injury but did show multifocal bronchopneumonia versus aspiration.  She received IV azithromycin  and cefepime  along with intravenous fluids in the ED and TRH was activated called to admit the patient.   She lives at home with her husband.  She has advanced dementia.   Assessment & Plan:  Principal Problem:   Multifocal pneumonia   Multifocal bacterial community-acquired pneumonia, POA: - in the setting of Influenza A infection.  CT of the chest showed evidence of multifocal pneumonia.   - Continue with intravenous cefepime  and azithromycin .   - cultures all negative thus far. Continue with Tylenol  as needed for fever as well as Mucinex , incentive spirometry and flutter valve. MRSA PCR is negative - switch to PO abx 10/23/24 if continues to improve.    Hypernatremia,POA: Likely hypovolemic 2/2 advanced dementia - D5W stopped yesterday.  Na this AM was 145.  - she has difficulty feeding herself 2/2 advanced dementia, problem may recur.   UTI: - urine culture growing pan-sensitive E coli.  Covered with current abx.    Influenza A, POA: Continue with symptomatic treatment.  Tamiflu  initiated at 30 mg Q48H x 5 days (renally adjusted dose).   Acute kidney injury, POA: Creatinine of 4.13 on admission.  Likely prerenal in the setting of dehydration.  Creatinine has improved to 1.51 -  watch off fluids for continued positive trend.     Anion gap Metabolic acidosis, POA: Likely in setting of acute kidney injury.  Improved with intravenous fluids.  - now resolved.     Type 2 diabetes mellitus with hyperglycemia, POA: A1c on 07/13/2024 was 7.7%.   - follow on sliding scale/semglee    Protein calorie malnutrition, POA: Patient is underweight as evidenced by BMI of 15.  Protein supplement will be provided and dietitian has been consulted. - she has little interest in food.   - palliative care consult would be helpful, unable to consult over the weekend.    COPD, POA:  - no current exacerbation.  Continue with albuterol  and Breztri .   Alzheimer's dementia, POA: Continue with Aricept  and Namenda  - advanced, complicating care   Hyperlipidemia, POA: Continue with statin   Hypertension, POA: Continue with amlodipine .  Holding losartan  in the setting of acute kidney injury.   Disposition: Lives at home with her husband.  She has advanced dementia. Nutrition Problem: Severe Malnutrition Etiology: chronic illness (COPD) Signs/Symptoms: severe muscle depletion, severe fat depletion, percent weight loss (11% weight loss within 6 months) Percent weight loss: 11 % Interventions: Ensure Enlive (each supplement provides 350kcal and 20 grams of protein), Magic cup, MVI, Liberalize Diet   DVT prophylaxis:  heparin  injection 5,000 Units Start: 10/21/24 0600 SCDs Start: 10/21/24 0404  Code Status:   Code Status: Full Code Level of care: Telemetry Status is: Inpatient Family communication:  Called husband several times, had to leave a message each time.    Subjective: Pt sleeping when I entered room.  Could awaken, but no meaningful conversation 2/2 advanced dementia.    Objective: Vitals:   10/22/24 1249 10/22/24 2147 10/23/24 0529 10/23/24 1420  BP: (!) 128/94 (!) 151/69 120/65 99/65  Pulse: 78 86 84 70  Resp:    18  Temp: 97.8 F (36.6 C) 99.2 F (37.3 C) 98.2 F (36.8  C) 97.9 F (36.6 C)  TempSrc: Oral Oral Oral Axillary  SpO2: 95% 95%  96%  Weight:      Height:        Intake/Output Summary (Last 24 hours) at 10/23/2024 1519 Last data filed at 10/23/2024 0700 Gross per 24 hour  Intake 220 ml  Output 800 ml  Net -580 ml   Filed Weights   10/20/24 1913 10/21/24 0122  Weight: 39.9 kg 35.5 kg   Body mass index is 15.28 kg/m.  Gen: 74 y.o. female in no apparent distress.  Nontoxic, thin, frail-appearing, elderly female Pulm: Non-labored breathing.  Clear to auscultation bilaterally.  CV: Regular rate and rhythm.  GI: Abdomen soft, non-tender, non-distended, with normoactive bowel sounds. No organomegaly or masses felt. Ext: Warm, no deformities,  Skin: No rashes, lesions  Neuro: Alert, not oriented today. No notable focal neurological deficits.    I have personally reviewed the following labs and images: CBC: Recent Labs  Lab 10/20/24 1936 10/21/24 0508 10/23/24 0616  WBC 10.1 9.2 6.1  NEUTROABS 8.6*  --   --   HGB 15.5* 12.3 12.2  HCT 51.9* 40.2 39.3  MCV 88.6 85.5 85.1  PLT 147* 166 174   BMP &GFR Recent Labs  Lab 10/20/24 1936 10/21/24 0508 10/22/24 0457 10/22/24 1231 10/22/24 2150 10/23/24 0616  NA 154* 155* 153* 151* 144 145  K 4.5 4.1 3.7 3.7 3.7 3.3*  CL 116* 119* 119* 116* 109 109  CO2 15* 21* 23 20* 23 30  GLUCOSE 149* 121* 307* 297* 412* 86  BUN 112* 105* 73* 62* 46* 35*  CREATININE 4.13* 3.40* 2.22* 2.10* 1.84* 1.51*  CALCIUM  8.4* 8.1* 8.1* 8.3* 8.3* 8.3*  MG 3.4* 2.9*  --   --   --   --   PHOS  --  4.5  --   --   --   --    Estimated Creatinine Clearance: 18.9 mL/min (A) (by C-G formula based on SCr of 1.51 mg/dL (H)). Liver & Pancreas: Recent Labs  Lab 10/20/24 1936 10/21/24 0508  AST 26 24  ALT 14 15  ALKPHOS 90 85  BILITOT 0.5 0.3  PROT 6.8 5.9*  ALBUMIN  3.2* 3.1*   No results for input(s): LIPASE, AMYLASE in the last 168 hours. No results for input(s): AMMONIA in the last 168  hours. Diabetic: Recent Labs    10/21/24 0508  HGBA1C 8.2*   Recent Labs  Lab 10/22/24 1625 10/22/24 2145 10/23/24 0739 10/23/24 0843 10/23/24 1130  GLUCAP 298* 338* 57* 110* 117*   Cardiac Enzymes: No results for input(s): CKTOTAL, CKMB, CKMBINDEX, TROPONINI in the last 168 hours. Recent Labs    10/20/24 1936  PROBNP 590.0*   Coagulation Profile: No results for input(s): INR, PROTIME in the last 168 hours. Thyroid  Function Tests: No results for input(s): TSH, T4TOTAL, FREET4, T3FREE, THYROIDAB in the last 72 hours. Lipid Profile: No results for input(s): CHOL, HDL, LDLCALC, TRIG, CHOLHDL, LDLDIRECT in the last 72 hours. Anemia Panel: No results for input(s): VITAMINB12, FOLATE, FERRITIN, TIBC, IRON, RETICCTPCT in the last 72 hours. Urine analysis:    Component Value Date/Time   COLORURINE AMBER (A)  10/20/2024 1936   APPEARANCEUR CLOUDY (A) 10/20/2024 1936   APPEARANCEUR Cloudy (A) 08/20/2023 0839   LABSPEC 1.014 10/20/2024 1936   PHURINE 5.0 10/20/2024 1936   GLUCOSEU NEGATIVE 10/20/2024 1936   HGBUR SMALL (A) 10/20/2024 1936   BILIRUBINUR NEGATIVE 10/20/2024 1936   BILIRUBINUR Negative 08/20/2023 0839   KETONESUR NEGATIVE 10/20/2024 1936   PROTEINUR 100 (A) 10/20/2024 1936   UROBILINOGEN negative 01/11/2015 1442   UROBILINOGEN 0.2 12/31/2014 0409   NITRITE NEGATIVE 10/20/2024 1936   LEUKOCYTESUR LARGE (A) 10/20/2024 1936   Sepsis Labs: Invalid input(s): PROCALCITONIN, LACTICIDVEN  Microbiology: Recent Results (from the past 240 hours)  Resp panel by RT-PCR (RSV, Flu A&B, Covid) Anterior Nasal Swab     Status: Abnormal   Collection Time: 10/20/24  7:36 PM   Specimen: Anterior Nasal Swab  Result Value Ref Range Status   SARS Coronavirus 2 by RT PCR NEGATIVE NEGATIVE Final    Comment: (NOTE) SARS-CoV-2 target nucleic acids are NOT DETECTED.  The SARS-CoV-2 RNA is generally detectable in upper  respiratory specimens during the acute phase of infection. The lowest concentration of SARS-CoV-2 viral copies this assay can detect is 138 copies/mL. A negative result does not preclude SARS-Cov-2 infection and should not be used as the sole basis for treatment or other patient management decisions. A negative result may occur with  improper specimen collection/handling, submission of specimen other than nasopharyngeal swab, presence of viral mutation(s) within the areas targeted by this assay, and inadequate number of viral copies(<138 copies/mL). A negative result must be combined with clinical observations, patient history, and epidemiological information. The expected result is Negative.  Fact Sheet for Patients:  bloggercourse.com  Fact Sheet for Healthcare Providers:  seriousbroker.it  This test is no t yet approved or cleared by the [place]  FDA and  has been authorized for detection and/or diagnosis of SARS-CoV-2 by FDA under an Emergency Use Authorization (EUA). This EUA will remain  in effect (meaning this test can be used) for the duration of the COVID-19 declaration under Section 564(b)(1) of the Act, 21 U.S.C.section 360bbb-3(b)(1), unless the authorization is terminated  or revoked sooner.       Influenza A by PCR POSITIVE (A) NEGATIVE Final   Influenza B by PCR NEGATIVE NEGATIVE Final    Comment: (NOTE) The Xpert Xpress SARS-CoV-2/FLU/RSV plus assay is intended as an aid in the diagnosis of influenza from Nasopharyngeal swab specimens and should not be used as a sole basis for treatment. Nasal washings and aspirates are unacceptable for Xpert Xpress SARS-CoV-2/FLU/RSV testing.  Fact Sheet for Patients: bloggercourse.com  Fact Sheet for Healthcare Providers: seriousbroker.it  This test is not yet approved or cleared by the [place]  FDA and has been  authorized for detection and/or diagnosis of SARS-CoV-2 by FDA under an Emergency Use Authorization (EUA). This EUA will remain in effect (meaning this test can be used) for the duration of the COVID-19 declaration under Section 564(b)(1) of the Act, 21 U.S.C. section 360bbb-3(b)(1), unless the authorization is terminated or revoked.     Resp Syncytial Virus by PCR NEGATIVE NEGATIVE Final    Comment: (NOTE) Fact Sheet for Patients: bloggercourse.com  Fact Sheet for Healthcare Providers: seriousbroker.it  This test is not yet approved or cleared by the [place]  FDA and has been authorized for detection and/or diagnosis of SARS-CoV-2 by FDA under an Emergency Use Authorization (EUA). This EUA will remain in effect (meaning this test can be used) for the duration of the COVID-19 declaration under Section  564(b)(1) of the Act, 21 U.S.C. section 360bbb-3(b)(1), unless the authorization is terminated or revoked.  Performed at [facility], [address] [place], [place] [address]   Urine Culture     Status: Abnormal   Collection Time: 10/20/24  7:36 PM   Specimen: Urine, Random  Result Value Ref Range Status   Specimen Description   Final    URINE, RANDOM Performed at [facility], [address], [place], [place] [address]    Special Requests   Final    NONE Reflexed from [id number] Performed at [facility], [address], [place], [place] [address]    Culture >=100,000 COLONIES/mL ESCHERICHIA COLI (A)  Final   Report Status 10/23/2024 FINAL  Final   Organism ID, Bacteria ESCHERICHIA COLI (A)  Final      Susceptibility   Escherichia coli - MIC*    AMPICILLIN 4 SENSITIVE Sensitive     CEFAZOLIN  (URINE) Value in next row Sensitive      2 SENSITIVEThis is a modified FDA-approved test that has been validated and its performance characteristics determined by the reporting laboratory.  This laboratory is certified under the Clinical  Laboratory Improvement Amendments CLIA as qualified to perform high complexity clinical laboratory testing.    CEFEPIME  Value in next row Sensitive      2 SENSITIVEThis is a modified FDA-approved test that has been validated and its performance characteristics determined by the reporting laboratory.  This laboratory is certified under the Clinical Laboratory Improvement Amendments CLIA as qualified to perform high complexity clinical laboratory testing.    ERTAPENEM Value in next row Sensitive      2 SENSITIVEThis is a modified FDA-approved test that has been validated and its performance characteristics determined by the reporting laboratory.  This laboratory is certified under the Clinical Laboratory Improvement Amendments CLIA as qualified to perform high complexity clinical laboratory testing.    CEFTRIAXONE  Value in next row Sensitive      2 SENSITIVEThis is a modified FDA-approved test that has been validated and its performance characteristics determined by the reporting laboratory.  This laboratory is certified under the Clinical Laboratory Improvement Amendments CLIA as qualified to perform high complexity clinical laboratory testing.    CIPROFLOXACIN  Value in next row Sensitive      2 SENSITIVEThis is a modified FDA-approved test that has been validated and its performance characteristics determined by the reporting laboratory.  This laboratory is certified under the Clinical Laboratory Improvement Amendments CLIA as qualified to perform high complexity clinical laboratory testing.    GENTAMICIN Value in next row Sensitive      2 SENSITIVEThis is a modified FDA-approved test that has been validated and its performance characteristics determined by the reporting laboratory.  This laboratory is certified under the Clinical Laboratory Improvement Amendments CLIA as qualified to perform high complexity clinical laboratory testing.    NITROFURANTOIN Value in next row Sensitive      2 SENSITIVEThis  is a modified FDA-approved test that has been validated and its performance characteristics determined by the reporting laboratory.  This laboratory is certified under the Clinical Laboratory Improvement Amendments CLIA as qualified to perform high complexity clinical laboratory testing.    TRIMETH /SULFA  Value in next row Sensitive      2 SENSITIVEThis is a modified FDA-approved test that has been validated and its performance characteristics determined by the reporting laboratory.  This laboratory is certified under the Clinical Laboratory Improvement Amendments CLIA as qualified to perform high complexity clinical laboratory testing.  AMPICILLIN/SULBACTAM Value in next row Sensitive      2 SENSITIVEThis is a modified FDA-approved test that has been validated and its performance characteristics determined by the reporting laboratory.  This laboratory is certified under the Clinical Laboratory Improvement Amendments CLIA as qualified to perform high complexity clinical laboratory testing.    PIP/TAZO Value in next row Sensitive      <=4 SENSITIVEThis is a modified FDA-approved test that has been validated and its performance characteristics determined by the reporting laboratory.  This laboratory is certified under the Clinical Laboratory Improvement Amendments CLIA as qualified to perform high complexity clinical laboratory testing.    MEROPENEM Value in next row Sensitive      <=4 SENSITIVEThis is a modified FDA-approved test that has been validated and its performance characteristics determined by the reporting laboratory.  This laboratory is certified under the Clinical Laboratory Improvement Amendments CLIA as qualified to perform high complexity clinical laboratory testing.    * >=100,000 COLONIES/mL ESCHERICHIA COLI  MRSA Next Gen by PCR, Nasal     Status: None   Collection Time: 10/21/24  1:20 AM   Specimen: Nasal Mucosa; Nasal Swab  Result Value Ref Range Status   MRSA by PCR Next Gen NOT  DETECTED NOT DETECTED Final    Comment: (NOTE) The GeneXpert MRSA Assay (FDA approved for NASAL specimens only), is one component of a comprehensive MRSA colonization surveillance program. It is not intended to diagnose MRSA infection nor to guide or monitor treatment for MRSA infections. Test performance is not FDA approved in patients less than 66 years old. Performed at [facility], [address], [place], [place] [address]   Culture, blood (routine x 2) Call MD if unable to obtain prior to antibiotics being given     Status: None (Preliminary result)   Collection Time: 10/21/24  3:16 AM   Specimen: BLOOD  Result Value Ref Range Status   Specimen Description BLOOD BLOOD LEFT ARM  Final   Special Requests   Final    BOTTLES DRAWN AEROBIC AND ANAEROBIC Blood Culture adequate volume   Culture   Final    NO GROWTH 2 DAYS Performed at [facility], [address] [place], [place] [address]    Report Status PENDING  Incomplete  Culture, blood (routine x 2) Call MD if unable to obtain prior to antibiotics being given     Status: None (Preliminary result)   Collection Time: 10/21/24  3:17 AM   Specimen: BLOOD  Result Value Ref Range Status   Specimen Description BLOOD BLOOD LEFT HAND  Final   Special Requests AEROBIC BOTTLE ONLY Blood Culture adequate volume  Final   Culture   Final    NO GROWTH 2 DAYS Performed at [facility], [address], [place], [place] [address]    Report Status PENDING  Incomplete    Radiology Studies: No results found.  Scheduled Meds:  amLODipine   10 mg Oral Daily   budesonide -glycopyrrolate -formoterol   2 puff Inhalation BID   Chlorhexidine  Gluconate Cloth  6 each Topical Q0600   donepezil   10 mg Oral QHS   doxycycline   100 mg Oral Q12H   feeding supplement  237 mL Oral BID BM   fentaNYL  (SUBLIMAZE ) injection  50 mcg Intravenous Once   heparin   5,000 Units Subcutaneous Q8H   insulin  aspart  0-5 Units Subcutaneous QHS   insulin  aspart  0-9 Units  Subcutaneous TID WC   insulin  glargine  15 Units Subcutaneous QHS   memantine   10  mg Oral BID   multivitamin with minerals  1 tablet Oral Daily   oseltamivir   30 mg Oral Daily   pravastatin   40 mg Oral Daily   Continuous Infusions:  cefTRIAXone  (ROCEPHIN )  IV Stopped (10/22/24 2311)     LOS: 3 days   35 minutes with more than 50% spent in reviewing records, counseling patient/family and coordinating care.  Reyes VEAR Gaw, MD [facility] [email or web address] 10/23/2024, 3:19 PM    "

## 2024-10-23 NOTE — Plan of Care (Signed)
 " Problem: Education: Goal: Knowledge of General Education information will improve Description: Including pain rating scale, medication(s)/side effects and non-pharmacologic comfort measures Outcome: Progressing   Problem: Health Behavior/Discharge Planning: Goal: Ability to manage health-related needs will improve Outcome: Progressing   Problem: Clinical Measurements: Goal: Ability to maintain clinical measurements within normal limits will improve Outcome: Progressing Goal: Will remain free from infection Outcome: Progressing Goal: Diagnostic test results will improve Outcome: Progressing Goal: Respiratory complications will improve Outcome: Progressing Goal: Cardiovascular complication will be avoided Outcome: Progressing   Problem: Activity: Goal: Risk for activity intolerance will decrease Outcome: Progressing   Problem: Nutrition: Goal: Adequate nutrition will be maintained Outcome: Progressing   Problem: Coping: Goal: Level of anxiety will decrease Outcome: Progressing   Problem: Elimination: Goal: Will not experience complications related to bowel motility Outcome: Progressing Goal: Will not experience complications related to urinary retention Outcome: Progressing   Problem: Pain Managment: Goal: General experience of comfort will improve and/or be controlled Outcome: Progressing   Problem: Safety: Goal: Ability to remain free from injury will improve Outcome: Progressing   Problem: Skin Integrity: Goal: Risk for impaired skin integrity will decrease Outcome: Progressing   Problem: Activity: Goal: Ability to tolerate increased activity will improve Outcome: Progressing   Problem: Clinical Measurements: Goal: Ability to maintain a body temperature in the normal range will improve Outcome: Progressing   Problem: Respiratory: Goal: Ability to maintain adequate ventilation will improve Outcome: Progressing Goal: Ability to maintain a clear airway  will improve Outcome: Progressing   Problem: Education: Goal: Ability to describe self-care measures that may prevent or decrease complications (Diabetes Survival Skills Education) will improve Outcome: Progressing Goal: Individualized Educational Video(s) Outcome: Progressing   Problem: Coping: Goal: Ability to adjust to condition or change in health will improve Outcome: Progressing   Problem: Fluid Volume: Goal: Ability to maintain a balanced intake and output will improve Outcome: Progressing   Problem: Health Behavior/Discharge Planning: Goal: Ability to identify and utilize available resources and services will improve Outcome: Progressing Goal: Ability to manage health-related needs will improve Outcome: Progressing   Problem: Metabolic: Goal: Ability to maintain appropriate glucose levels will improve Outcome: Progressing   Problem: Nutritional: Goal: Maintenance of adequate nutrition will improve Outcome: Progressing Goal: Progress toward achieving an optimal weight will improve Outcome: Progressing   Problem: Skin Integrity: Goal: Risk for impaired skin integrity will decrease Outcome: Progressing   Problem: Tissue Perfusion: Goal: Adequacy of tissue perfusion will improve Outcome: Progressing   Problem: Education: Goal: Knowledge of General Education information will improve Description: Including pain rating scale, medication(s)/side effects and non-pharmacologic comfort measures Outcome: Progressing   Problem: Health Behavior/Discharge Planning: Goal: Ability to manage health-related needs will improve Outcome: Progressing   Problem: Clinical Measurements: Goal: Ability to maintain clinical measurements within normal limits will improve Outcome: Progressing Goal: Will remain free from infection Outcome: Progressing Goal: Diagnostic test results will improve Outcome: Progressing Goal: Respiratory complications will improve Outcome:  Progressing Goal: Cardiovascular complication will be avoided Outcome: Progressing   Problem: Activity: Goal: Risk for activity intolerance will decrease Outcome: Progressing   Problem: Nutrition: Goal: Adequate nutrition will be maintained Outcome: Progressing   Problem: Coping: Goal: Level of anxiety will decrease Outcome: Progressing   Problem: Elimination: Goal: Will not experience complications related to bowel motility Outcome: Progressing Goal: Will not experience complications related to urinary retention Outcome: Progressing   Problem: Pain Managment: Goal: General experience of comfort will improve and/or be controlled Outcome: Progressing   Problem: Safety: Goal: Ability  to remain free from injury will improve Outcome: Progressing   Problem: Skin Integrity: Goal: Risk for impaired skin integrity will decrease Outcome: Progressing   Problem: Activity: Goal: Ability to tolerate increased activity will improve Outcome: Progressing   Problem: Clinical Measurements: Goal: Ability to maintain a body temperature in the normal range will improve Outcome: Progressing   Problem: Respiratory: Goal: Ability to maintain adequate ventilation will improve Outcome: Progressing Goal: Ability to maintain a clear airway will improve Outcome: Progressing   Problem: Education: Goal: Ability to describe self-care measures that may prevent or decrease complications (Diabetes Survival Skills Education) will improve Outcome: Progressing Goal: Individualized Educational Video(s) Outcome: Progressing   Problem: Coping: Goal: Ability to adjust to condition or change in health will improve Outcome: Progressing   Problem: Fluid Volume: Goal: Ability to maintain a balanced intake and output will improve Outcome: Progressing   Problem: Health Behavior/Discharge Planning: Goal: Ability to identify and utilize available resources and services will improve Outcome:  Progressing Goal: Ability to manage health-related needs will improve Outcome: Progressing   Problem: Metabolic: Goal: Ability to maintain appropriate glucose levels will improve Outcome: Progressing   Problem: Nutritional: Goal: Maintenance of adequate nutrition will improve Outcome: Progressing Goal: Progress toward achieving an optimal weight will improve Outcome: Progressing   Problem: Skin Integrity: Goal: Risk for impaired skin integrity will decrease Outcome: Progressing   Problem: Tissue Perfusion: Goal: Adequacy of tissue perfusion will improve Outcome: Progressing   "

## 2024-10-24 DIAGNOSIS — J188 Other pneumonia, unspecified organism: Secondary | ICD-10-CM | POA: Diagnosis not present

## 2024-10-24 LAB — BASIC METABOLIC PANEL WITH GFR
Anion gap: 5 (ref 5–15)
Anion gap: 13 (ref 5–15)
BUN: 28 mg/dL — ABNORMAL HIGH (ref 8–23)
BUN: 24 mg/dL — ABNORMAL HIGH (ref 8–23)
CO2: 30 mmol/L (ref 22–32)
CO2: 20 mmol/L — ABNORMAL LOW (ref 22–32)
Calcium: 8.5 mg/dL — ABNORMAL LOW (ref 8.9–10.3)
Calcium: 8.5 mg/dL — ABNORMAL LOW (ref 8.9–10.3)
Chloride: 114 mmol/L — ABNORMAL HIGH (ref 98–111)
Chloride: 110 mmol/L (ref 98–111)
Creatinine, Ser: 1.56 mg/dL — ABNORMAL HIGH (ref 0.44–1.00)
Creatinine, Ser: 1.39 mg/dL — ABNORMAL HIGH (ref 0.44–1.00)
GFR, Estimated: 35 mL/min — ABNORMAL LOW
GFR, Estimated: 40 mL/min — ABNORMAL LOW
Glucose, Bld: 75 mg/dL (ref 70–99)
Glucose, Bld: 211 mg/dL — ABNORMAL HIGH (ref 70–99)
Potassium: 4.2 mmol/L (ref 3.5–5.1)
Potassium: 4.3 mmol/L (ref 3.5–5.1)
Sodium: 149 mmol/L — ABNORMAL HIGH (ref 135–145)
Sodium: 143 mmol/L (ref 135–145)

## 2024-10-24 LAB — GLUCOSE, CAPILLARY
Glucose-Capillary: 68 mg/dL — ABNORMAL LOW (ref 70–99)
Glucose-Capillary: 117 mg/dL — ABNORMAL HIGH (ref 70–99)
Glucose-Capillary: 251 mg/dL — ABNORMAL HIGH (ref 70–99)
Glucose-Capillary: 182 mg/dL — ABNORMAL HIGH (ref 70–99)

## 2024-10-24 MED ORDER — INSULIN GLARGINE 100 UNIT/ML ~~LOC~~ SOLN
7.0000 [IU] | Freq: Every day | SUBCUTANEOUS | Status: DC
  Administered 2024-10-24: 7 [IU] via SUBCUTANEOUS
  Filled 2024-10-24 (×3): qty 0.07

## 2024-10-24 MED ORDER — AMOXICILLIN-POT CLAVULANATE 500-125 MG PO TABS
1.0000 | ORAL_TABLET | Freq: Two times a day (BID) | ORAL | Status: AC
  Administered 2024-10-24 – 2024-10-27 (×7): 1 via ORAL
  Filled 2024-10-24 (×7): qty 1

## 2024-10-24 MED ORDER — DEXTROSE 5 % IV SOLN: INTRAVENOUS | Status: AC

## 2024-10-24 MED ORDER — AMOXICILLIN-POT CLAVULANATE 875-125 MG PO TABS: 1.0000 | ORAL_TABLET | Freq: Two times a day (BID) | ORAL | Status: DC

## 2024-10-24 NOTE — Progress Notes (Addendum)
 " PROGRESS NOTE  Jamie [name]  FMW:[id number] DOB: 1951-07-11 DOA: 10/20/2024 PCP: Lavell Bari LABOR, FNP  Consultants  Brief Narrative: 74 y.o. female with medical history significant of hypertension, hyperlipidemia, T2DM, COPD, dementia who presents to the emergency department from home via EMS due to generalized weakness.  Daughter came to visit and noticed that she is weaker and more confused than usual so she called EMS. On arrival in the emergency room, patient was found to be hypotensive which improved with intravenous fluids.  Of the chest abdomen pelvis without contrast showed no evidence of acute traumatic injury but did show multifocal bronchopneumonia versus aspiration.  She received IV azithromycin  and cefepime  along with intravenous fluids in the ED and TRH was activated called to admit the patient.   She lives at home with her husband.  She has advanced dementia.  Spoke with husband late PM of 1/10 -- feels he cannot care for her well at home.  She herself hasn't demonstrated interest or even much ability in eating.  Believe she is nearing end of life from advanced dementia.  Plan will be to consult palliative care today 1/11 for goals of care meeting with family.     Assessment & Plan: Multifocal bacterial community-acquired pneumonia, POA: - in the setting of Influenza A infection.  CT of the chest showed evidence of multifocal pneumonia.   - Transition to augmentin  today as monotherapy, continue tamiflu  - cultures all negative thus far. Continue with Tylenol  as needed for fever as well as Mucinex , incentive spirometry and flutter valve.  Hypernatremia,POA:  - resolved s/p fluids -- however has recurred again.  Restarted D5W - Has had little to no fluid/food intake on her own.  Believe this is further evidence of advanced dementia  - Will treat, but needs to have goals of care discussion with family.  She has difficulty feeding herself 2/2 advanced dementia, problem may recur.    Failure to thrive as adult: - demonstrated here in hospital as well as home as she came in hypernatremic.  Corrected well with IVF, but she hasn't had any interest in eating/drinking or even interacting with people - sleeps most of the day.  Easily awakened, but garbled/meaningless speech secondary to dementia.   - palliative consult as above.   UTI: - urine culture growing pan-sensitive E coli.  Covered with current abx.    Influenza A, POA:  Continue with symptomatic treatment.   Tamiflu  initiated at 30 mg Q48H x 5 days (renally adjusted dose). - stop date tamiflu  will be last dose on 1/12   Acute kidney injury, POA:  - Creatinine of 4.13 on admission.  Likely prerenal in the setting of dehydration.  Creatinine has improved to 1.51 - essentially same today, restarted fluids as she's not feeding herself.      Anion gap Metabolic acidosis, POA:  - Likely in setting of acute kidney injury.  Improved with intravenous fluids.  - now resolved.     Type 2 diabetes mellitus with hyperglycemia, POA: A1c on 07/13/2024 was 7.7%.   - follow on sliding scale/semglee -->restarted D5W so expect CBGs to rise again   Protein calorie malnutrition, POA, severe: Patient is underweight as evidenced by BMI of 15.  Protein supplement will be provided and dietitian has been consulted. - she has little interest in food.   - palliative as above.     COPD, POA:  - no current exacerbation.  Continue with albuterol  and Breztri . - hasn't had any albuterol  b/c not  aware enough to ask for it.  Lungs sound good, no evidence of exacerbation currently.    Alzheimer's dementia, POA: Continue with Aricept  and Namenda  - advanced, complicating care   Hyperlipidemia, POA: Continue with statin   Hypertension, POA: Continue with amlodipine .  Holding losartan  in the setting of acute kidney injury.   Disposition: Lives at home with her husband.  She has advanced dementia. Nutrition Problem: Severe  Malnutrition Etiology: chronic illness (COPD) Signs/Symptoms: severe muscle depletion, severe fat depletion, percent weight loss (11% weight loss within 6 months) Percent weight loss: 11 % Interventions: Ensure Enlive (each supplement provides 350kcal and 20 grams of protein), Magic cup, MVI, Liberalize Diet   DVT prophylaxis:  heparin  injection 5,000 Units Start: 10/21/24 0600 SCDs Start: 10/21/24 0404  Code Status:   Code Status: Full Code Level of care: Telemetry Status is: Inpatient Family communication:  Spoke with husband late yesterday who corroborates not much meaningful conversation with her at home, even with family.    Subjective: Pt sleeping when I entered room.  Could awaken, but no meaningful conversation 2/2 advanced dementia.    Objective: Vitals:   10/23/24 0529 10/23/24 1420 10/23/24 2204 10/24/24 0502  BP: 120/65 99/65 133/77 133/72  Pulse: 84 70 86 82  Resp:  18    Temp: 98.2 F (36.8 C) 97.9 F (36.6 C) 98.9 F (37.2 C) 98.6 F (37 C)  TempSrc: Oral Axillary Oral Oral  SpO2:  96% 95% 94%  Weight:      Height:        Intake/Output Summary (Last 24 hours) at 10/24/2024 1122 Last data filed at 10/24/2024 0756 Gross per 24 hour  Intake 430 ml  Output 1200 ml  Net -770 ml   Filed Weights   10/20/24 1913 10/21/24 0122  Weight: 39.9 kg 35.5 kg   Body mass index is 15.28 kg/m.  Gen: 74 y.o. female in no apparent distress.  Nontoxic, thin, frail-appearing, elderly female, sleeping but easily arousable.   Pulm: Non-labored breathing.  Clear to auscultation bilaterally.  CV: Regular rate and rhythm.  GI: Abdomen soft, non-tender, non-distended, with normoactive bowel sounds. No organomegaly or masses felt. Ext: Warm, no deformities,  Skin: No rashes, lesions  Neuro: Alert, not oriented today. No notable focal neurological deficits.  Garbled speech    I have personally reviewed the following labs and images: CBC: Recent Labs  Lab 10/20/24 1936  10/21/24 0508 10/23/24 0616  WBC 10.1 9.2 6.1  NEUTROABS 8.6*  --   --   HGB 15.5* 12.3 12.2  HCT 51.9* 40.2 39.3  MCV 88.6 85.5 85.1  PLT 147* 166 174   BMP &GFR Recent Labs  Lab 10/20/24 1936 10/21/24 0508 10/22/24 0457 10/22/24 1231 10/22/24 2150 10/23/24 0616 10/24/24 0438  NA 154* 155* 153* 151* 144 145 149*  K 4.5 4.1 3.7 3.7 3.7 3.3* 4.2  CL 116* 119* 119* 116* 109 109 114*  CO2 15* 21* 23 20* 23 30 30   GLUCOSE 149* 121* 307* 297* 412* 86 75  BUN 112* 105* 73* 62* 46* 35* 28*  CREATININE 4.13* 3.40* 2.22* 2.10* 1.84* 1.51* 1.56*  CALCIUM  8.4* 8.1* 8.1* 8.3* 8.3* 8.3* 8.5*  MG 3.4* 2.9*  --   --   --   --   --   PHOS  --  4.5  --   --   --   --   --    Estimated Creatinine Clearance: 18.3 mL/min (A) (by C-G formula based on SCr of  1.56 mg/dL (H)). Liver & Pancreas: Recent Labs  Lab 10/20/24 1936 10/21/24 0508  AST 26 24  ALT 14 15  ALKPHOS 90 85  BILITOT 0.5 0.3  PROT 6.8 5.9*  ALBUMIN  3.2* 3.1*   No results for input(s): LIPASE, AMYLASE in the last 168 hours. No results for input(s): AMMONIA in the last 168 hours. Diabetic: No results for input(s): HGBA1C in the last 72 hours.  Recent Labs  Lab 10/23/24 0843 10/23/24 1130 10/23/24 1629 10/23/24 2208 10/24/24 0719  GLUCAP 110* 117* 101* 175* 68*   Cardiac Enzymes: No results for input(s): CKTOTAL, CKMB, CKMBINDEX, TROPONINI in the last 168 hours. Recent Labs    10/20/24 1936  PROBNP 590.0*   Coagulation Profile: No results for input(s): INR, PROTIME in the last 168 hours. Thyroid  Function Tests: No results for input(s): TSH, T4TOTAL, FREET4, T3FREE, THYROIDAB in the last 72 hours. Lipid Profile: No results for input(s): CHOL, HDL, LDLCALC, TRIG, CHOLHDL, LDLDIRECT in the last 72 hours. Anemia Panel: No results for input(s): VITAMINB12, FOLATE, FERRITIN, TIBC, IRON, RETICCTPCT in the last 72 hours. Urine analysis:    Component Value  Date/Time   COLORURINE AMBER (A) 10/20/2024 1936   APPEARANCEUR CLOUDY (A) 10/20/2024 1936   APPEARANCEUR Cloudy (A) 08/20/2023 0839   LABSPEC 1.014 10/20/2024 1936   PHURINE 5.0 10/20/2024 1936   GLUCOSEU NEGATIVE 10/20/2024 1936   HGBUR SMALL (A) 10/20/2024 1936   BILIRUBINUR NEGATIVE 10/20/2024 1936   BILIRUBINUR Negative 08/20/2023 0839   KETONESUR NEGATIVE 10/20/2024 1936   PROTEINUR 100 (A) 10/20/2024 1936   UROBILINOGEN negative 01/11/2015 1442   UROBILINOGEN 0.2 12/31/2014 0409   NITRITE NEGATIVE 10/20/2024 1936   LEUKOCYTESUR LARGE (A) 10/20/2024 1936   Sepsis Labs: Invalid input(s): PROCALCITONIN, LACTICIDVEN  Microbiology: Recent Results (from the past 240 hours)  Resp panel by RT-PCR (RSV, Flu A&B, Covid) Anterior Nasal Swab     Status: Abnormal   Collection Time: 10/20/24  7:36 PM   Specimen: Anterior Nasal Swab  Result Value Ref Range Status   SARS Coronavirus 2 by RT PCR NEGATIVE NEGATIVE Final    Comment: (NOTE) SARS-CoV-2 target nucleic acids are NOT DETECTED.  The SARS-CoV-2 RNA is generally detectable in upper respiratory specimens during the acute phase of infection. The lowest concentration of SARS-CoV-2 viral copies this assay can detect is 138 copies/mL. A negative result does not preclude SARS-Cov-2 infection and should not be used as the sole basis for treatment or other patient management decisions. A negative result may occur with  improper specimen collection/handling, submission of specimen other than nasopharyngeal swab, presence of viral mutation(s) within the areas targeted by this assay, and inadequate number of viral copies(<138 copies/mL). A negative result must be combined with clinical observations, patient history, and epidemiological information. The expected result is Negative.  Fact Sheet for Patients:  bloggercourse.com  Fact Sheet for Healthcare Providers:   seriousbroker.it  This test is no t yet approved or cleared by the [place]  FDA and  has been authorized for detection and/or diagnosis of SARS-CoV-2 by FDA under an Emergency Use Authorization (EUA). This EUA will remain  in effect (meaning this test can be used) for the duration of the COVID-19 declaration under Section 564(b)(1) of the Act, 21 U.S.C.section 360bbb-3(b)(1), unless the authorization is terminated  or revoked sooner.       Influenza A by PCR POSITIVE (A) NEGATIVE Final   Influenza B by PCR NEGATIVE NEGATIVE Final    Comment: (NOTE) The Xpert Xpress SARS-CoV-2/FLU/RSV plus  assay is intended as an aid in the diagnosis of influenza from Nasopharyngeal swab specimens and should not be used as a sole basis for treatment. Nasal washings and aspirates are unacceptable for Xpert Xpress SARS-CoV-2/FLU/RSV testing.  Fact Sheet for Patients: bloggercourse.com  Fact Sheet for Healthcare Providers: seriousbroker.it  This test is not yet approved or cleared by the [place]  FDA and has been authorized for detection and/or diagnosis of SARS-CoV-2 by FDA under an Emergency Use Authorization (EUA). This EUA will remain in effect (meaning this test can be used) for the duration of the COVID-19 declaration under Section 564(b)(1) of the Act, 21 U.S.C. section 360bbb-3(b)(1), unless the authorization is terminated or revoked.     Resp Syncytial Virus by PCR NEGATIVE NEGATIVE Final    Comment: (NOTE) Fact Sheet for Patients: bloggercourse.com  Fact Sheet for Healthcare Providers: seriousbroker.it  This test is not yet approved or cleared by the [place]  FDA and has been authorized for detection and/or diagnosis of SARS-CoV-2 by FDA under an Emergency Use Authorization (EUA). This EUA will remain in effect (meaning this test can be used)  for the duration of the COVID-19 declaration under Section 564(b)(1) of the Act, 21 U.S.C. section 360bbb-3(b)(1), unless the authorization is terminated or revoked.  Performed at [facility], [address] [place], [place] [address]   Urine Culture     Status: Abnormal   Collection Time: 10/20/24  7:36 PM   Specimen: Urine, Random  Result Value Ref Range Status   Specimen Description   Final    URINE, RANDOM Performed at [facility], [address] [place], [place] [address]    Special Requests   Final    NONE Reflexed from [id number] Performed at [facility], [address] [place], [place] [address]    Culture >=100,000 COLONIES/mL ESCHERICHIA COLI (A)  Final   Report Status 10/23/2024 FINAL  Final   Organism ID, Bacteria ESCHERICHIA COLI (A)  Final      Susceptibility   Escherichia coli - MIC*    AMPICILLIN 4 SENSITIVE Sensitive     CEFAZOLIN  (URINE) Value in next row Sensitive      2 SENSITIVEThis is a modified FDA-approved test that has been validated and its performance characteristics determined by the reporting laboratory.  This laboratory is certified under the Clinical Laboratory Improvement Amendments CLIA as qualified to perform high complexity clinical laboratory testing.    CEFEPIME  Value in next row Sensitive      2 SENSITIVEThis is a modified FDA-approved test that has been validated and its performance characteristics determined by the reporting laboratory.  This laboratory is certified under the Clinical Laboratory Improvement Amendments CLIA as qualified to perform high complexity clinical laboratory testing.    ERTAPENEM Value in next row Sensitive      2 SENSITIVEThis is a modified FDA-approved test that has been validated and its performance characteristics determined by the reporting laboratory.  This laboratory is certified under the Clinical Laboratory Improvement Amendments CLIA as qualified to perform high complexity clinical laboratory testing.     CEFTRIAXONE  Value in next row Sensitive      2 SENSITIVEThis is a modified FDA-approved test that has been validated and its performance characteristics determined by the reporting laboratory.  This laboratory is certified under the Clinical Laboratory Improvement Amendments CLIA as qualified to perform high complexity clinical laboratory testing.    CIPROFLOXACIN  Value in next row Sensitive      2 SENSITIVEThis is a modified FDA-approved test that has  been validated and its performance characteristics determined by the reporting laboratory.  This laboratory is certified under the Clinical Laboratory Improvement Amendments CLIA as qualified to perform high complexity clinical laboratory testing.    GENTAMICIN Value in next row Sensitive      2 SENSITIVEThis is a modified FDA-approved test that has been validated and its performance characteristics determined by the reporting laboratory.  This laboratory is certified under the Clinical Laboratory Improvement Amendments CLIA as qualified to perform high complexity clinical laboratory testing.    NITROFURANTOIN Value in next row Sensitive      2 SENSITIVEThis is a modified FDA-approved test that has been validated and its performance characteristics determined by the reporting laboratory.  This laboratory is certified under the Clinical Laboratory Improvement Amendments CLIA as qualified to perform high complexity clinical laboratory testing.    TRIMETH /SULFA  Value in next row Sensitive      2 SENSITIVEThis is a modified FDA-approved test that has been validated and its performance characteristics determined by the reporting laboratory.  This laboratory is certified under the Clinical Laboratory Improvement Amendments CLIA as qualified to perform high complexity clinical laboratory testing.    AMPICILLIN/SULBACTAM Value in next row Sensitive      2 SENSITIVEThis is a modified FDA-approved test that has been validated and its performance characteristics  determined by the reporting laboratory.  This laboratory is certified under the Clinical Laboratory Improvement Amendments CLIA as qualified to perform high complexity clinical laboratory testing.    PIP/TAZO Value in next row Sensitive      <=4 SENSITIVEThis is a modified FDA-approved test that has been validated and its performance characteristics determined by the reporting laboratory.  This laboratory is certified under the Clinical Laboratory Improvement Amendments CLIA as qualified to perform high complexity clinical laboratory testing.    MEROPENEM Value in next row Sensitive      <=4 SENSITIVEThis is a modified FDA-approved test that has been validated and its performance characteristics determined by the reporting laboratory.  This laboratory is certified under the Clinical Laboratory Improvement Amendments CLIA as qualified to perform high complexity clinical laboratory testing.    * >=100,000 COLONIES/mL ESCHERICHIA COLI  MRSA Next Gen by PCR, Nasal     Status: None   Collection Time: 10/21/24  1:20 AM   Specimen: Nasal Mucosa; Nasal Swab  Result Value Ref Range Status   MRSA by PCR Next Gen NOT DETECTED NOT DETECTED Final    Comment: (NOTE) The GeneXpert MRSA Assay (FDA approved for NASAL specimens only), is one component of a comprehensive MRSA colonization surveillance program. It is not intended to diagnose MRSA infection nor to guide or monitor treatment for MRSA infections. Test performance is not FDA approved in patients less than 41 years old. Performed at [facility], [address] [place], [place] [address]   Culture, blood (routine x 2) Call MD if unable to obtain prior to antibiotics being given     Status: None (Preliminary result)   Collection Time: 10/21/24  3:16 AM   Specimen: BLOOD  Result Value Ref Range Status   Specimen Description BLOOD BLOOD LEFT ARM  Final   Special Requests   Final    BOTTLES DRAWN AEROBIC AND ANAEROBIC Blood Culture adequate volume    Culture   Final    NO GROWTH 3 DAYS Performed at [facility], [address], [place], [place] [address]    Report Status PENDING  Incomplete  Culture, blood (routine x 2) Call MD if unable to  obtain prior to antibiotics being given     Status: None (Preliminary result)   Collection Time: 10/21/24  3:17 AM   Specimen: BLOOD  Result Value Ref Range Status   Specimen Description BLOOD BLOOD LEFT HAND  Final   Special Requests AEROBIC BOTTLE ONLY Blood Culture adequate volume  Final   Culture   Final    NO GROWTH 3 DAYS Performed at [facility], [address], [place], [place] [address]    Report Status PENDING  Incomplete    Radiology Studies: No results found.  Scheduled Meds:  amLODipine   10 mg Oral Daily   budesonide -glycopyrrolate -formoterol   2 puff Inhalation BID   Chlorhexidine  Gluconate Cloth  6 each Topical Q0600   donepezil   10 mg Oral QHS   doxycycline   100 mg Oral Q12H   feeding supplement  237 mL Oral BID BM   fentaNYL  (SUBLIMAZE ) injection  50 mcg Intravenous Once   heparin   5,000 Units Subcutaneous Q8H   insulin  aspart  0-5 Units Subcutaneous QHS   insulin  aspart  0-9 Units Subcutaneous TID WC   insulin  glargine  7 Units Subcutaneous QHS   memantine   10 mg Oral BID   multivitamin with minerals  1 tablet Oral Daily   oseltamivir   30 mg Oral Daily   pravastatin   40 mg Oral Daily   Continuous Infusions:  cefTRIAXone  (ROCEPHIN )  IV 1 g (10/23/24 2221)   dextrose  100 mL/hr at 10/24/24 0957     LOS: 4 days   35 minutes with more than 50% spent in reviewing records, counseling patient/family and coordinating care.  Reyes VEAR Gaw, MD [facility] [email or web address] 10/24/2024, 11:22 AM    "

## 2024-10-24 NOTE — Plan of Care (Signed)
 " Problem: Education: Goal: Knowledge of General Education information will improve Description: Including pain rating scale, medication(s)/side effects and non-pharmacologic comfort measures Outcome: Progressing   Problem: Health Behavior/Discharge Planning: Goal: Ability to manage health-related needs will improve Outcome: Progressing   Problem: Clinical Measurements: Goal: Ability to maintain clinical measurements within normal limits will improve Outcome: Progressing Goal: Will remain free from infection Outcome: Progressing Goal: Diagnostic test results will improve Outcome: Progressing Goal: Respiratory complications will improve Outcome: Progressing Goal: Cardiovascular complication will be avoided Outcome: Progressing   Problem: Activity: Goal: Risk for activity intolerance will decrease Outcome: Progressing   Problem: Nutrition: Goal: Adequate nutrition will be maintained Outcome: Progressing   Problem: Coping: Goal: Level of anxiety will decrease Outcome: Progressing   Problem: Elimination: Goal: Will not experience complications related to bowel motility Outcome: Progressing Goal: Will not experience complications related to urinary retention Outcome: Progressing   Problem: Pain Managment: Goal: General experience of comfort will improve and/or be controlled Outcome: Progressing   Problem: Safety: Goal: Ability to remain free from injury will improve Outcome: Progressing   Problem: Skin Integrity: Goal: Risk for impaired skin integrity will decrease Outcome: Progressing   Problem: Activity: Goal: Ability to tolerate increased activity will improve Outcome: Progressing   Problem: Clinical Measurements: Goal: Ability to maintain a body temperature in the normal range will improve Outcome: Progressing   Problem: Respiratory: Goal: Ability to maintain adequate ventilation will improve Outcome: Progressing Goal: Ability to maintain a clear airway  will improve Outcome: Progressing   Problem: Education: Goal: Ability to describe self-care measures that may prevent or decrease complications (Diabetes Survival Skills Education) will improve Outcome: Progressing Goal: Individualized Educational Video(s) Outcome: Progressing   Problem: Coping: Goal: Ability to adjust to condition or change in health will improve Outcome: Progressing   Problem: Fluid Volume: Goal: Ability to maintain a balanced intake and output will improve Outcome: Progressing   Problem: Health Behavior/Discharge Planning: Goal: Ability to identify and utilize available resources and services will improve Outcome: Progressing Goal: Ability to manage health-related needs will improve Outcome: Progressing   Problem: Metabolic: Goal: Ability to maintain appropriate glucose levels will improve Outcome: Progressing   Problem: Nutritional: Goal: Maintenance of adequate nutrition will improve Outcome: Progressing Goal: Progress toward achieving an optimal weight will improve Outcome: Progressing   Problem: Skin Integrity: Goal: Risk for impaired skin integrity will decrease Outcome: Progressing   Problem: Tissue Perfusion: Goal: Adequacy of tissue perfusion will improve Outcome: Progressing   Problem: Education: Goal: Knowledge of General Education information will improve Description: Including pain rating scale, medication(s)/side effects and non-pharmacologic comfort measures Outcome: Progressing   Problem: Health Behavior/Discharge Planning: Goal: Ability to manage health-related needs will improve Outcome: Progressing   Problem: Clinical Measurements: Goal: Ability to maintain clinical measurements within normal limits will improve Outcome: Progressing Goal: Will remain free from infection Outcome: Progressing Goal: Diagnostic test results will improve Outcome: Progressing Goal: Respiratory complications will improve Outcome:  Progressing Goal: Cardiovascular complication will be avoided Outcome: Progressing   Problem: Activity: Goal: Risk for activity intolerance will decrease Outcome: Progressing   Problem: Nutrition: Goal: Adequate nutrition will be maintained Outcome: Progressing   Problem: Coping: Goal: Level of anxiety will decrease Outcome: Progressing   Problem: Elimination: Goal: Will not experience complications related to bowel motility Outcome: Progressing Goal: Will not experience complications related to urinary retention Outcome: Progressing   Problem: Pain Managment: Goal: General experience of comfort will improve and/or be controlled Outcome: Progressing   Problem: Safety: Goal: Ability  to remain free from injury will improve Outcome: Progressing   Problem: Skin Integrity: Goal: Risk for impaired skin integrity will decrease Outcome: Progressing   Problem: Activity: Goal: Ability to tolerate increased activity will improve Outcome: Progressing   Problem: Clinical Measurements: Goal: Ability to maintain a body temperature in the normal range will improve Outcome: Progressing   Problem: Respiratory: Goal: Ability to maintain adequate ventilation will improve Outcome: Progressing Goal: Ability to maintain a clear airway will improve Outcome: Progressing   Problem: Education: Goal: Ability to describe self-care measures that may prevent or decrease complications (Diabetes Survival Skills Education) will improve Outcome: Progressing Goal: Individualized Educational Video(s) Outcome: Progressing   Problem: Coping: Goal: Ability to adjust to condition or change in health will improve Outcome: Progressing   Problem: Fluid Volume: Goal: Ability to maintain a balanced intake and output will improve Outcome: Progressing   Problem: Health Behavior/Discharge Planning: Goal: Ability to identify and utilize available resources and services will improve Outcome:  Progressing Goal: Ability to manage health-related needs will improve Outcome: Progressing   Problem: Metabolic: Goal: Ability to maintain appropriate glucose levels will improve Outcome: Progressing   Problem: Nutritional: Goal: Maintenance of adequate nutrition will improve Outcome: Progressing Goal: Progress toward achieving an optimal weight will improve Outcome: Progressing   Problem: Skin Integrity: Goal: Risk for impaired skin integrity will decrease Outcome: Progressing   Problem: Tissue Perfusion: Goal: Adequacy of tissue perfusion will improve Outcome: Progressing   "

## 2024-10-24 NOTE — Evaluation (Signed)
 Physical Therapy Evaluation Patient Details Name: Jamie [name] MRN: [id number] DOB: 08/09/1951 Today's Date: 10/24/2024  History of Present Illness  74 y.o. female with medical history significant of hypertension, hyperlipidemia, T2DM, COPD, dementia who presents to the emergency department from home via EMS due to generalized weakness.  Daughter came to visit and noticed that she is weaker and more confused than usual so she called EMS.  On arrival in the emergency room, patient was found to be hypotensive which improved with intravenous fluids.  Of the chest abdomen pelvis without contrast showed no evidence of acute traumatic injury but did show multifocal bronchopneumonia versus aspiration.  She received IV azithromycin  and cefepime  along with intravenous fluids in the ED and TRH was activated called to admit the patient.  Clinical Impression  PT in fetal position when therapist enters the room.  Pt will respond to verbal/tactile cuing.  Pt lives with husband at home.  At this time pt is a fall risk and will benefit from SNF services to improve pt strength, activity level and balance prior to returning home.          If plan is discharge home, recommend the following: A little help with walking and/or transfers;A little help with bathing/dressing/bathroom;Assistance with cooking/housework;Direct supervision/assist for medications management;Direct supervision/assist for financial management;Assist for transportation;Help with stairs or ramp for entrance;Supervision due to cognitive status   Can travel by private vehicle    yes    Equipment Recommendations None recommended by PT  Recommendations for Other Services       Functional Status Assessment Patient has had a recent decline in their functional status and demonstrates the ability to make significant improvements in function in a reasonable and predictable amount of time.     Precautions / Restrictions Precautions Precautions:  Fall Recall of Precautions/Restrictions: Impaired Restrictions Weight Bearing Restrictions Per Provider Order: No      Mobility  Bed Mobility Overal bed mobility: Modified Independent                  Transfers Overall transfer level: Needs assistance Equipment used: Rolling walker (2 wheels) Transfers: Sit to/from Stand Sit to Stand: Min assist                Ambulation/Gait Ambulation/Gait assistance: Contact guard assist Gait Distance (Feet): 5 Feet Assistive device: Rolling walker (2 wheels) Gait Pattern/deviations: Decreased step length - right, Decreased step length - left                  Pertinent Vitals/Pain Pain Assessment Pain Assessment: No/denies pain    Home Living Family/patient expects to be discharged to:: Private residence Living Arrangements: Spouse/significant other Available Help at Discharge: Family;Available 24 hours/day Type of Home: House Home Access: Stairs to enter Entrance Stairs-Rails: None Entrance Stairs-Number of Steps: 3   Home Layout: One level Home Equipment: Agricultural Consultant (2 wheels)      Prior Function  PT states she walked without any assistive device.  PT is not reliable                      Extremity/Trunk Assessment        Lower Extremity Assessment Lower Extremity Assessment: Generalized weakness       Communication   Communication Factors Affecting Communication: Hearing impaired    Cognition Arousal: Alert Behavior During Therapy: WFL for tasks assessed/performed   PT - Cognitive impairments: History of cognitive impairments  Following commands: Impaired Following commands impaired: Only follows one step commands consistently     Cueing Cueing Techniques: Verbal cues            Assessment/Plan    PT Assessment Patient needs continued PT services  PT Problem List Decreased strength;Decreased activity tolerance;Decreased  balance;Decreased mobility;Decreased skin integrity       PT Treatment Interventions Gait training;Functional mobility training;Therapeutic exercise    PT Goals (Current goals can be found in the Care Plan section)  Acute Rehab PT Goals Patient Stated Goal: pt to walk with RW x 30 ft with min assist PT Goal Formulation: Patient unable to participate in goal setting Potential to Achieve Goals: Good    Frequency Min 2X/week        AM-PAC PT 6 Clicks Mobility  Outcome Measure Help needed turning from your back to your side while in a flat bed without using bedrails?: None Help needed moving from lying on your back to sitting on the side of a flat bed without using bedrails?: None Help needed moving to and from a bed to a chair (including a wheelchair)?: A Little Help needed standing up from a chair using your arms (e.g., wheelchair or bedside chair)?: A Little Help needed to walk in hospital room?: A Lot Help needed climbing 3-5 steps with a railing? : A Lot 6 Click Score: 18    End of Session Equipment Utilized During Treatment: Gait belt Activity Tolerance: Patient tolerated treatment well Patient left: in chair;with call bell/phone within reach;with chair alarm set Nurse Communication: Mobility status PT Visit Diagnosis: Unsteadiness on feet (R26.81);Repeated falls (R29.6);Muscle weakness (generalized) (M62.81)    Time: 8944-8884 PT Time Calculation (min) (ACUTE ONLY): 20 min   Charges:   PT Evaluation $PT Eval Low Complexity: 1 Low              Montie Metro, PT CLT [phone]  10/24/2024, 11:22 AM

## 2024-10-24 NOTE — Plan of Care (Signed)
  Problem: Pain Managment: Goal: General experience of comfort will improve and/or be controlled Outcome: Progressing   Problem: Safety: Goal: Ability to remain free from injury will improve Outcome: Progressing

## 2024-10-25 ENCOUNTER — Encounter (HOSPITAL_COMMUNITY): Payer: Self-pay | Admitting: Internal Medicine

## 2024-10-25 DIAGNOSIS — Z515 Encounter for palliative care: Secondary | ICD-10-CM | POA: Diagnosis not present

## 2024-10-25 DIAGNOSIS — Z7189 Other specified counseling: Secondary | ICD-10-CM

## 2024-10-25 DIAGNOSIS — J188 Other pneumonia, unspecified organism: Secondary | ICD-10-CM | POA: Diagnosis not present

## 2024-10-25 LAB — GLUCOSE, CAPILLARY
Glucose-Capillary: 271 mg/dL — ABNORMAL HIGH (ref 70–99)
Glucose-Capillary: 331 mg/dL — ABNORMAL HIGH (ref 70–99)
Glucose-Capillary: 202 mg/dL — ABNORMAL HIGH (ref 70–99)
Glucose-Capillary: 232 mg/dL — ABNORMAL HIGH (ref 70–99)

## 2024-10-25 MED ORDER — HYDRALAZINE HCL 20 MG/ML IJ SOLN: 10.0000 mg | INTRAMUSCULAR | Status: DC | PRN

## 2024-10-25 MED ORDER — METOPROLOL TARTRATE 5 MG/5ML IV SOLN: 5.0000 mg | INTRAVENOUS | Status: DC | PRN

## 2024-10-25 MED ORDER — GLUCAGON HCL RDNA (DIAGNOSTIC) 1 MG IJ SOLR: 1.0000 mg | INTRAMUSCULAR | Status: DC | PRN

## 2024-10-25 MED ORDER — IPRATROPIUM-ALBUTEROL 0.5-2.5 (3) MG/3ML IN SOLN: 3.0000 mL | RESPIRATORY_TRACT | Status: DC | PRN

## 2024-10-25 NOTE — Progress Notes (Signed)
 " PROGRESS NOTE    Jamie [name]  FMW:[id number] DOB: 1951-06-19 DOA: 10/20/2024 PCP: Lavell Bari LABOR, FNP    Brief Narrative:   74 y.o. female with medical history significant of hypertension, advanced dementia, hyperlipidemia, T2DM, COPD, dementia who presents to the emergency department from home via EMS due to generalized weakness and confusion.  Upon admission patient was found to have multifocal community-acquired pneumonia secondary to influenza A infection complicated by dehydration, hypernatremia, AKI, UTI and failure to thrive.  Assessment & Plan:  Multifocal bacterial community-acquired pneumonia, POA Influenza A  -Initially patient was started on cefepime  thereafter transition to Rocephin  and doxycycline  and now on Augmentin .  Will complete 7-day course.  Finish 5 days of Tamiflu .  Bronchodilators as needed, I-S/flutter valve.   Hypernatremia,POA Acute kidney injury with metabolic acidosis Adult failure to thrive with severe malnutrition -Patient clinically dehydrated upon admission.  Baseline creatinine1.5, admission creatinine 4.13 improved with IV fluids now stabilizing.  Initial sodium 155 which is improved with IV fluids. -Unfortunately due to advanced dementia her p.o. intake remains poor.  Encourage p.o. nutrition -Consulted palliative care   UTI: Treated with antibiotics    Type 2 diabetes mellitus with hyperglycemia, POA:  A1c on 07/13/2024 was 7.7%.   Sliding scale and Accu-Chek.  Adjust as necessary     COPD, POA:  - no current exacerbation.  Continue with albuterol  and Breztri . - hasn't had any albuterol  b/c not aware enough to ask for it.  Lungs sound good, no evidence of exacerbation currently.    Alzheimer's dementia, POA: Continue with Aricept  and Namenda  - advanced, complicating care   Hyperlipidemia, POA:  Continue with statin   Hypertension, POA:  Continue with amlodipine .  Holding losartan  in the setting of acute kidney injury. IV as  needed  Palliative care team consulted.  Ongoing discussions for goals of care   DVT prophylaxis: heparin  injection 5,000 Units Start: 10/21/24 0600 SCDs Start: 10/21/24 0404      Code Status: Full Code Family Communication:   Status is: Inpatient Remains inpatient appropriate because: Ongoing discussions for goals of care   PT Follow up Recs: Skilled Nursing-Short Term Rehab (<3 Hours/Day)10/24/2024 1116  Subjective:  Laying in the bed no complaints.  Poor oral intake  Examination:  General exam: Appears calm and comfortable, elderly frail Respiratory system: Clear to auscultation. Respiratory effort normal. Cardiovascular system: S1 & S2 heard, RRR. No JVD, murmurs, rubs, gallops or clicks. No pedal edema. Gastrointestinal system: Abdomen is nondistended, soft and nontender. No organomegaly or masses felt. Normal bowel sounds heard. Central nervous system: Difficult to assess Extremities: Symmetric 5 x 5 power. Skin: No rashes, lesions or ulcers Psychiatry: Unable to assess                Diet Orders (From admission, onward)     Start     Ordered   10/21/24 1119  Diet regular Room service appropriate? Yes; Fluid consistency: Thin  Diet effective now       Question Answer Comment  Room service appropriate? Yes   Fluid consistency: Thin      10/21/24 1119            Objective: Vitals:   10/24/24 1938 10/24/24 2137 10/25/24 0626 10/25/24 0854  BP:  132/65 (!) 147/98   Pulse:  82 72   Resp:  (!) 21    Temp:  98.3 F (36.8 C) 98.2 F (36.8 C)   TempSrc:  Oral Oral   SpO2: 95% 100% 98%  98%  Weight:      Height:        Intake/Output Summary (Last 24 hours) at 10/25/2024 1129 Last data filed at 10/25/2024 9372 Gross per 24 hour  Intake 1732.13 ml  Output 800 ml  Net 932.13 ml   Filed Weights   10/20/24 1913 10/21/24 0122  Weight: 39.9 kg 35.5 kg    Scheduled Meds:  amLODipine   10 mg Oral Daily   amoxicillin -clavulanate  1 tablet Oral  Q12H   budesonide -glycopyrrolate -formoterol   2 puff Inhalation BID   Chlorhexidine  Gluconate Cloth  6 each Topical Q0600   donepezil   10 mg Oral QHS   doxycycline   100 mg Oral Q12H   feeding supplement  237 mL Oral BID BM   fentaNYL  (SUBLIMAZE ) injection  50 mcg Intravenous Once   heparin   5,000 Units Subcutaneous Q8H   insulin  aspart  0-5 Units Subcutaneous QHS   insulin  aspart  0-9 Units Subcutaneous TID WC   insulin  glargine  7 Units Subcutaneous QHS   memantine   10 mg Oral BID   multivitamin with minerals  1 tablet Oral Daily   pravastatin   40 mg Oral Daily   Continuous Infusions:  Nutritional status Signs/Symptoms: severe muscle depletion, severe fat depletion, percent weight loss (11% weight loss within 6 months) Percent weight loss: 11 % Interventions: Ensure Enlive (each supplement provides 350kcal and 20 grams of protein), Magic cup, MVI, Liberalize Diet Body mass index is 15.28 kg/m.  Data Reviewed:   CBC: Recent Labs  Lab 10/20/24 1936 10/21/24 0508 10/23/24 0616  WBC 10.1 9.2 6.1  NEUTROABS 8.6*  --   --   HGB 15.5* 12.3 12.2  HCT 51.9* 40.2 39.3  MCV 88.6 85.5 85.1  PLT 147* 166 174   Basic Metabolic Panel: Recent Labs  Lab 10/20/24 1936 10/21/24 0508 10/22/24 0457 10/22/24 1231 10/22/24 2150 10/23/24 0616 10/24/24 0438 10/24/24 1758  NA 154* 155*   < > 151* 144 145 149* 143  K 4.5 4.1   < > 3.7 3.7 3.3* 4.2 4.3  CL 116* 119*   < > 116* 109 109 114* 110  CO2 15* 21*   < > 20* 23 30 30  20*  GLUCOSE 149* 121*   < > 297* 412* 86 75 211*  BUN 112* 105*   < > 62* 46* 35* 28* 24*  CREATININE 4.13* 3.40*   < > 2.10* 1.84* 1.51* 1.56* 1.39*  CALCIUM  8.4* 8.1*   < > 8.3* 8.3* 8.3* 8.5* 8.5*  MG 3.4* 2.9*  --   --   --   --   --   --   PHOS  --  4.5  --   --   --   --   --   --    < > = values in this interval not displayed.   GFR: Estimated Creatinine Clearance: 20.5 mL/min (A) (by C-G formula based on SCr of 1.39 mg/dL (H)). Liver Function  Tests: Recent Labs  Lab 10/20/24 1936 10/21/24 0508  AST 26 24  ALT 14 15  ALKPHOS 90 85  BILITOT 0.5 0.3  PROT 6.8 5.9*  ALBUMIN  3.2* 3.1*   No results for input(s): LIPASE, AMYLASE in the last 168 hours. No results for input(s): AMMONIA in the last 168 hours. Coagulation Profile: No results for input(s): INR, PROTIME in the last 168 hours. Cardiac Enzymes: No results for input(s): CKTOTAL, CKMB, CKMBINDEX, TROPONINI in the last 168 hours. BNP (last 3 results) Recent Labs  10/20/24 1936  PROBNP 590.0*   HbA1C: No results for input(s): HGBA1C in the last 72 hours. CBG: Recent Labs  Lab 10/24/24 0719 10/24/24 1250 10/24/24 1702 10/24/24 2136 10/25/24 0756  GLUCAP 68* 117* 251* 182* 271*   Lipid Profile: No results for input(s): CHOL, HDL, LDLCALC, TRIG, CHOLHDL, LDLDIRECT in the last 72 hours. Thyroid  Function Tests: No results for input(s): TSH, T4TOTAL, FREET4, T3FREE, THYROIDAB in the last 72 hours. Anemia Panel: No results for input(s): VITAMINB12, FOLATE, FERRITIN, TIBC, IRON, RETICCTPCT in the last 72 hours. Sepsis Labs: Recent Labs  Lab 10/20/24 1936 10/20/24 2135  LATICACIDVEN 1.9 1.5    Recent Results (from the past 240 hours)  Resp panel by RT-PCR (RSV, Flu A&B, Covid) Anterior Nasal Swab     Status: Abnormal   Collection Time: 10/20/24  7:36 PM   Specimen: Anterior Nasal Swab  Result Value Ref Range Status   SARS Coronavirus 2 by RT PCR NEGATIVE NEGATIVE Final    Comment: (NOTE) SARS-CoV-2 target nucleic acids are NOT DETECTED.  The SARS-CoV-2 RNA is generally detectable in upper respiratory specimens during the acute phase of infection. The lowest concentration of SARS-CoV-2 viral copies this assay can detect is 138 copies/mL. A negative result does not preclude SARS-Cov-2 infection and should not be used as the sole basis for treatment or other patient management decisions. A negative  result may occur with  improper specimen collection/handling, submission of specimen other than nasopharyngeal swab, presence of viral mutation(s) within the areas targeted by this assay, and inadequate number of viral copies(<138 copies/mL). A negative result must be combined with clinical observations, patient history, and epidemiological information. The expected result is Negative.  Fact Sheet for Patients:  bloggercourse.com  Fact Sheet for Healthcare Providers:  seriousbroker.it  This test is no t yet approved or cleared by the [place]  FDA and  has been authorized for detection and/or diagnosis of SARS-CoV-2 by FDA under an Emergency Use Authorization (EUA). This EUA will remain  in effect (meaning this test can be used) for the duration of the COVID-19 declaration under Section 564(b)(1) of the Act, 21 U.S.C.section 360bbb-3(b)(1), unless the authorization is terminated  or revoked sooner.       Influenza A by PCR POSITIVE (A) NEGATIVE Final   Influenza B by PCR NEGATIVE NEGATIVE Final    Comment: (NOTE) The Xpert Xpress SARS-CoV-2/FLU/RSV plus assay is intended as an aid in the diagnosis of influenza from Nasopharyngeal swab specimens and should not be used as a sole basis for treatment. Nasal washings and aspirates are unacceptable for Xpert Xpress SARS-CoV-2/FLU/RSV testing.  Fact Sheet for Patients: bloggercourse.com  Fact Sheet for Healthcare Providers: seriousbroker.it  This test is not yet approved or cleared by the [place]  FDA and has been authorized for detection and/or diagnosis of SARS-CoV-2 by FDA under an Emergency Use Authorization (EUA). This EUA will remain in effect (meaning this test can be used) for the duration of the COVID-19 declaration under Section 564(b)(1) of the Act, 21 U.S.C. section 360bbb-3(b)(1), unless the authorization is  terminated or revoked.     Resp Syncytial Virus by PCR NEGATIVE NEGATIVE Final    Comment: (NOTE) Fact Sheet for Patients: bloggercourse.com  Fact Sheet for Healthcare Providers: seriousbroker.it  This test is not yet approved or cleared by the [place]  FDA and has been authorized for detection and/or diagnosis of SARS-CoV-2 by FDA under an Emergency Use Authorization (EUA). This EUA will remain in effect (meaning this test can be used)  for the duration of the COVID-19 declaration under Section 564(b)(1) of the Act, 21 U.S.C. section 360bbb-3(b)(1), unless the authorization is terminated or revoked.  Performed at [facility], [address], [place], [place] [address]   Urine Culture     Status: Abnormal   Collection Time: 10/20/24  7:36 PM   Specimen: Urine, Random  Result Value Ref Range Status   Specimen Description   Final    URINE, RANDOM Performed at [facility], [address], [place], [place] [address]    Special Requests   Final    NONE Reflexed from [id number] Performed at [facility], [address], [place], [place] [address]    Culture >=100,000 COLONIES/mL ESCHERICHIA COLI (A)  Final   Report Status 10/23/2024 FINAL  Final   Organism ID, Bacteria ESCHERICHIA COLI (A)  Final      Susceptibility   Escherichia coli - MIC*    AMPICILLIN 4 SENSITIVE Sensitive     CEFAZOLIN  (URINE) Value in next row Sensitive      2 SENSITIVEThis is a modified FDA-approved test that has been validated and its performance characteristics determined by the reporting laboratory.  This laboratory is certified under the Clinical Laboratory Improvement Amendments CLIA as qualified to perform high complexity clinical laboratory testing.    CEFEPIME  Value in next row Sensitive      2 SENSITIVEThis is a modified FDA-approved test that has been validated and its performance characteristics determined by the reporting laboratory.  This  laboratory is certified under the Clinical Laboratory Improvement Amendments CLIA as qualified to perform high complexity clinical laboratory testing.    ERTAPENEM Value in next row Sensitive      2 SENSITIVEThis is a modified FDA-approved test that has been validated and its performance characteristics determined by the reporting laboratory.  This laboratory is certified under the Clinical Laboratory Improvement Amendments CLIA as qualified to perform high complexity clinical laboratory testing.    CEFTRIAXONE  Value in next row Sensitive      2 SENSITIVEThis is a modified FDA-approved test that has been validated and its performance characteristics determined by the reporting laboratory.  This laboratory is certified under the Clinical Laboratory Improvement Amendments CLIA as qualified to perform high complexity clinical laboratory testing.    CIPROFLOXACIN  Value in next row Sensitive      2 SENSITIVEThis is a modified FDA-approved test that has been validated and its performance characteristics determined by the reporting laboratory.  This laboratory is certified under the Clinical Laboratory Improvement Amendments CLIA as qualified to perform high complexity clinical laboratory testing.    GENTAMICIN Value in next row Sensitive      2 SENSITIVEThis is a modified FDA-approved test that has been validated and its performance characteristics determined by the reporting laboratory.  This laboratory is certified under the Clinical Laboratory Improvement Amendments CLIA as qualified to perform high complexity clinical laboratory testing.    NITROFURANTOIN Value in next row Sensitive      2 SENSITIVEThis is a modified FDA-approved test that has been validated and its performance characteristics determined by the reporting laboratory.  This laboratory is certified under the Clinical Laboratory Improvement Amendments CLIA as qualified to perform high complexity clinical laboratory testing.    TRIMETH /SULFA   Value in next row Sensitive      2 SENSITIVEThis is a modified FDA-approved test that has been validated and its performance characteristics determined by the reporting laboratory.  This laboratory is certified under the Clinical Laboratory Improvement Amendments CLIA as qualified to  perform high complexity clinical laboratory testing.    AMPICILLIN/SULBACTAM Value in next row Sensitive      2 SENSITIVEThis is a modified FDA-approved test that has been validated and its performance characteristics determined by the reporting laboratory.  This laboratory is certified under the Clinical Laboratory Improvement Amendments CLIA as qualified to perform high complexity clinical laboratory testing.    PIP/TAZO Value in next row Sensitive      <=4 SENSITIVEThis is a modified FDA-approved test that has been validated and its performance characteristics determined by the reporting laboratory.  This laboratory is certified under the Clinical Laboratory Improvement Amendments CLIA as qualified to perform high complexity clinical laboratory testing.    MEROPENEM Value in next row Sensitive      <=4 SENSITIVEThis is a modified FDA-approved test that has been validated and its performance characteristics determined by the reporting laboratory.  This laboratory is certified under the Clinical Laboratory Improvement Amendments CLIA as qualified to perform high complexity clinical laboratory testing.    * >=100,000 COLONIES/mL ESCHERICHIA COLI  MRSA Next Gen by PCR, Nasal     Status: None   Collection Time: 10/21/24  1:20 AM   Specimen: Nasal Mucosa; Nasal Swab  Result Value Ref Range Status   MRSA by PCR Next Gen NOT DETECTED NOT DETECTED Final    Comment: (NOTE) The GeneXpert MRSA Assay (FDA approved for NASAL specimens only), is one component of a comprehensive MRSA colonization surveillance program. It is not intended to diagnose MRSA infection nor to guide or monitor treatment for MRSA infections. Test  performance is not FDA approved in patients less than 76 years old. Performed at [facility], [address], [place], [place] [address]   Culture, blood (routine x 2) Call MD if unable to obtain prior to antibiotics being given     Status: None (Preliminary result)   Collection Time: 10/21/24  3:16 AM   Specimen: BLOOD  Result Value Ref Range Status   Specimen Description BLOOD BLOOD LEFT ARM  Final   Special Requests   Final    BOTTLES DRAWN AEROBIC AND ANAEROBIC Blood Culture adequate volume   Culture   Final    NO GROWTH 4 DAYS Performed at [facility], [address], [place], [place] [address]    Report Status PENDING  Incomplete  Culture, blood (routine x 2) Call MD if unable to obtain prior to antibiotics being given     Status: None (Preliminary result)   Collection Time: 10/21/24  3:17 AM   Specimen: BLOOD  Result Value Ref Range Status   Specimen Description BLOOD BLOOD LEFT HAND  Final   Special Requests AEROBIC BOTTLE ONLY Blood Culture adequate volume  Final   Culture   Final    NO GROWTH 4 DAYS Performed at [facility], [address], [place], [place] [address]    Report Status PENDING  Incomplete         Radiology Studies: No results found.         LOS: 5 days   Time spent= 35 mins    Burgess JAYSON Dare, MD [facility]  If 7PM-7AM, please contact night-coverage  10/25/2024, 11:29 AM  "

## 2024-10-25 NOTE — Inpatient Diabetes Management (Signed)
 Inpatient Diabetes Program Recommendations  AACE/ADA: New Consensus Statement on Inpatient Glycemic Control   Target Ranges:  Prepandial:   less than 140 mg/dL      Peak postprandial:   less than 180 mg/dL (1-2 hours)      Critically ill patients:  140 - 180 mg/dL    Latest Reference Range & Units 10/24/24 07:19 10/24/24 12:50 10/24/24 17:02 10/24/24 21:36 10/25/24 07:56  Glucose-Capillary 70 - 99 mg/dL 68 (L) 882 (H) 748 (H) 182 (H) 271 (H)   Review of Glycemic Control  Diabetes history: DM2 Outpatient Diabetes medications: Lantus  18 units daily Current orders for Inpatient glycemic control: Lantus  7 units at bedtime, Novolog  0-9 units TID with meals, Novolog  0-5 units QHS  Inpatient Diabetes Program Recommendations:    Insulin : Noted patient received Lantus  15 units on 10/23/24 and CBG 68 mg/dl on 8/88/73 at 2:80 am and Lantus  decreased down to 7 units at bedtime. CBG 271 mg/dl this morning. Please consider increasing Lantus  to 10 units at bedtime.  Thanks, Earnie Gainer, RN, MSN, CDCES Diabetes Coordinator Inpatient Diabetes Program [phone] (Team Pager from 8am to 5pm)

## 2024-10-25 NOTE — Plan of Care (Signed)
  Problem: Clinical Measurements: Goal: Will remain free from infection Outcome: Progressing   Problem: Coping: Goal: Level of anxiety will decrease Outcome: Progressing   

## 2024-10-25 NOTE — Hospital Course (Addendum)
 Brief Narrative:   74 y.o. female with medical history significant of hypertension, advanced dementia, hyperlipidemia, T2DM, COPD, dementia who presents to the emergency department from home via EMS due to generalized weakness and confusion.  Upon admission patient was found to have multifocal community-acquired pneumonia secondary to influenza A infection complicated by dehydration, hypernatremia, AKI, UTI and failure to thrive. Eventually she started slowly improving.  Due to her advanced dementia and comorbidities, had palliative care discussion and patient and family opted for to continue medical treatment.  PT OT recommended SNF  Assessment & Plan:  Multifocal bacterial community-acquired pneumonia, POA Influenza A  Improving.  Will transition to 1 more day of p.o. Augmentin .  Completed 5 days of Tamiflu .  Continue home bronchodilators.   Hypernatremia,POA Acute kidney injury with metabolic acidosis Adult failure to thrive with severe malnutrition - Advised oral hydration at home.  Recommended to recheck BMP outpatient in next 5 to 7 days.  Antihypertensive losartan  may need to be adjusted depending on renal function and her oral intake -Unfortunately due to advanced dementia her p.o. intake remains poor.  Encourage p.o. nutrition - Palliative care team to continue following outpatient   UTI: Treated with antibiotics    Type 2 diabetes mellitus with hyperglycemia, POA:  A1c on 07/13/2024 was 7.7%.   Sliding scale and Accu-Chek and long-acting.  Adjust as necessary     COPD, POA:  - no current exacerbation.  Continue with albuterol  and Breztri . - hasn't had any albuterol  b/c not aware enough to ask for it.  Lungs sound good, no evidence of exacerbation currently.    Alzheimer's dementia, POA: Continue with Aricept  and Namenda  - advanced, complicating care   Hyperlipidemia, POA:  Continue with statin   Hypertension, POA:  Continue with amlodipine .  Holding losartan  in the  setting of acute kidney injury. IV as needed  Seen by palliative care service   DVT prophylaxis: Subcu heparin     Code Status: Full Code Family Communication:   Status is: Inpatient Remains inpatient appropriate because: Discharge    PT Follow up Recs: Skilled Nursing-Short Term Rehab (<3 Hours/Day)10/24/2024 1116  Subjective:  No new complaints.  Sitting up in the recliner wants to be settled in the bed  Examination:  General exam: Appears calm and comfortable, elderly frail Respiratory system: Clear to auscultation. Respiratory effort normal. Cardiovascular system: S1 & S2 heard, RRR. No JVD, murmurs, rubs, gallops or clicks. No pedal edema. Gastrointestinal system: Abdomen is nondistended, soft and nontender. No organomegaly or masses felt. Normal bowel sounds heard. Central nervous system: Difficult to assess Extremities: Symmetric 5 x 5 power. Skin: No rashes, lesions or ulcers Psychiatry: Unable to assess

## 2024-10-25 NOTE — Plan of Care (Signed)

## 2024-10-25 NOTE — NC FL2 (Signed)
 " [place]  MEDICAID FL2 LEVEL OF CARE FORM     IDENTIFICATION  Patient Name: Jamie [name] Birthdate: December 09, 1950 Sex: female Admission Date (Current Location): 10/20/2024  [facility] and [place] Number:  [employer] and Address:  [facility],  [address] [place] [address]      Provider Number: [id number]  Attending Physician Name and Address:  Caleen Burgess BROCKS, MD  Relative Name and Phone Number:  Chrisanne Loose ( spouse) [phone]    Current Level of Care: Hospital Recommended Level of Care: Skilled Nursing Facility Prior Approval Number:    Date Approved/Denied:   PASRR Number: [id number]  Discharge Plan: SNF    Current Diagnoses: Patient Active Problem List   Diagnosis Date Noted   Protein-calorie malnutrition, severe 10/21/2024   Multifocal pneumonia 10/20/2024   Diabetes mellitus due to underlying condition with hypoglycemia (HCC) 11/24/2023   Rhabdomyolysis 11/24/2023   Elevated troponin 11/24/2023   Hypokalemia 11/24/2023   Hypoglycemia 11/23/2023   Moderate early onset Alzheimer's dementia with agitation (HCC) 11/07/2022   Essential hypertension, benign 08/10/2019   Postural kyphosis of lumbar region 02/23/2019   Tobacco abuse counseling 09/09/2018   Lumbar radiculopathy, chronic 02/03/2017   DM type 2 causing vascular disease (HCC)    Chronic back pain 10/03/2016   Underweight 05/02/2016   GERD (gastroesophageal reflux disease) 02/13/2015   Hard of hearing 02/10/2014   Hyperlipidemia associated with type 2 diabetes mellitus (HCC)    Vitamin D  deficiency    Coronary artery calcification 08/31/2013   Lung nodule, right upper lobe 8 mm March 2014 02/04/2013   COPD (chronic obstructive pulmonary disease) (HCC) 02/04/2013   Current smoker 02/04/2013    Orientation RESPIRATION BLADDER Height & Weight        Normal Indwelling catheter Weight: 78 lb 4.2 oz (35.5 kg) Height:  5' (152.4 cm)  BEHAVIORAL SYMPTOMS/MOOD NEUROLOGICAL BOWEL  NUTRITION STATUS      Continent Diet (Regular)  AMBULATORY STATUS COMMUNICATION OF NEEDS Skin   Limited Assist Verbally Normal                       Personal Care Assistance Level of Assistance  Bathing, Feeding, Dressing Bathing Assistance: Limited assistance Feeding assistance: Independent Dressing Assistance: Limited assistance     Functional Limitations Info  Sight, Hearing, Speech Sight Info: Adequate Hearing Info: Impaired Speech Info: Adequate    SPECIAL CARE FACTORS FREQUENCY  PT (By licensed PT), OT (By licensed OT)     PT Frequency: 5 x a week OT Frequency: 5 x a week            Contractures Contractures Info: Not present    Additional Factors Info  Code Status, Allergies Code Status Info: FULL Allergies Info: Lipitor ( atorvastarin) and Soma (carisoprodol)           Current Medications (10/25/2024):  This is the current hospital active medication list Current Facility-Administered Medications  Medication Dose Route Frequency Provider Last Rate Last Admin   acetaminophen  (TYLENOL ) tablet 650 mg  650 mg Oral Q6H PRN Adefeso, Oladapo, DO   650 mg at 10/23/24 0948   Or   acetaminophen  (TYLENOL ) suppository 650 mg  650 mg Rectal Q6H PRN Adefeso, Oladapo, DO       amLODipine  (NORVASC ) tablet 10 mg  10 mg Oral Daily Adefeso, Oladapo, DO   10 mg at 10/24/24 0943   amoxicillin -clavulanate (AUGMENTIN ) 500-125 MG per tablet 1 tablet  1 tablet Oral Q12H Elpidio,  Reyes DEL, MD   1 tablet at 10/24/24 1705   budesonide -glycopyrrolate -formoterol  (BREZTRI ) 160-9-4.8 MCG/ACT inhaler 2 puff  2 puff Inhalation BID Adefeso, Oladapo, DO   2 puff at 10/25/24 0854   Chlorhexidine  Gluconate Cloth 2 % PADS 6 each  6 each Topical Q0600 Adefeso, Oladapo, DO   6 each at 10/25/24 0616   dextromethorphan -guaiFENesin  (MUCINEX  DM) 30-600 MG per 12 hr tablet 1 tablet  1 tablet Oral BID PRN Walden, Jeffrey H, MD   1 tablet at 10/23/24 0947   donepezil  (ARICEPT ) tablet 10 mg  10 mg Oral  QHS Adefeso, Oladapo, DO   10 mg at 10/24/24 2202   doxycycline  (VIBRA -TABS) tablet 100 mg  100 mg Oral Q12H Rashid, Farhan, MD   100 mg at 10/24/24 2202   feeding supplement (ENSURE PLUS HIGH PROTEIN) liquid 237 mL  237 mL Oral BID BM Rashid, Farhan, MD   237 mL at 10/24/24 1709   fentaNYL  (SUBLIMAZE ) injection 50 mcg  50 mcg Intravenous Once Melvenia Motto, MD       glucagon  (human recombinant) (GLUCAGEN) injection 1 mg  1 mg Intravenous PRN Amin, Ankit C, MD       heparin  injection 5,000 Units  5,000 Units Subcutaneous Q8H Adefeso, Oladapo, DO   5,000 Units at 10/25/24 0601   hydrALAZINE  (APRESOLINE ) injection 10 mg  10 mg Intravenous Q4H PRN Amin, Ankit C, MD       insulin  aspart (novoLOG ) injection 0-5 Units  0-5 Units Subcutaneous QHS Adefeso, Oladapo, DO   4 Units at 10/22/24 2231   insulin  aspart (novoLOG ) injection 0-9 Units  0-9 Units Subcutaneous TID WC Adefeso, Oladapo, DO   3 Units at 10/24/24 1927   insulin  glargine (LANTUS ) injection 7 Units  7 Units Subcutaneous QHS Elpidio Reyes DEL, MD   7 Units at 10/24/24 2202   ipratropium-albuterol  (DUONEB) 0.5-2.5 (3) MG/3ML nebulizer solution 3 mL  3 mL Nebulization Q4H PRN Amin, Ankit C, MD       memantine  (NAMENDA ) tablet 10 mg  10 mg Oral BID Adefeso, Oladapo, DO   10 mg at 10/24/24 2202   metoprolol  tartrate (LOPRESSOR ) injection 5 mg  5 mg Intravenous Q4H PRN Amin, Ankit C, MD       multivitamin with minerals tablet 1 tablet  1 tablet Oral Daily Rashid, Farhan, MD   1 tablet at 10/24/24 0944   ondansetron  (ZOFRAN ) tablet 4 mg  4 mg Oral Q6H PRN Adefeso, Oladapo, DO       Or   ondansetron  (ZOFRAN ) injection 4 mg  4 mg Intravenous Q6H PRN Adefeso, Oladapo, DO       oseltamivir  (TAMIFLU ) capsule 30 mg  30 mg Oral Daily Elpidio Reyes DEL, MD   30 mg at 10/24/24 0944   pravastatin  (PRAVACHOL ) tablet 40 mg  40 mg Oral Daily Adefeso, Oladapo, DO   40 mg at 10/24/24 0944     Discharge Medications: Please see discharge summary for a list of  discharge medications.  Relevant Imaging Results:  Relevant Lab Results:   Additional Information SS#: [id number]  Noreen KATHEE Pinal, LCSWA     "

## 2024-10-25 NOTE — TOC Progression Note (Addendum)
 Transition of Care [facility]) - Progression Note    Patient Details  Name: Jamie [name] MRN: [id number] Date of Birth: 1951/10/04  [facility] CM/SW Contact  Noreen KATHEE Pinal, [place] Phone Number: 10/25/2024, 9:05 AM  Clinical Narrative:     CSW called patient step daughter regarding PT recommendation for SNF since patient is disorient at this time and unable to understand or make decision and the spouse  is sick with the Flu and cannot care for patient at this time. Step daughter agreed for CSW to start SNF process and to call her back on her cell around noon , because she will be with her dad. She also shared that her dad stated that right now he cannot care of patient due to his health and having upcoming appointments that she will be taking him too. CSW sent patient referral out to [facility] since that is close for them , [facility], [facility], and [facility]. [facility] will continue to follow.   Addendum 12:00 pm   CSW spoke with patient step daughter and spouse on the phone regarding SNF bed offer. When explaining it to spouse and advising them that she can transport by vehicle - they decided to try HHPT/OT/RN/Aide. Step daughter stated that she did not want to be the one to take patient and she gets upset about it . Step daughter stated that she will help care for patient and will pick her up tomorrow since she is going with her dad to his appointments today.  CSW then updated step daughter on palliative reaching out today,  she said that will be fine and she will make sure her dad is present for call. Step daughter did state that at home patient was not eating or drinking much. Palliative NP updated on calling daughter to discuss options with goals of care.  Expected Discharge Plan: Skilled Nursing Facility Barriers to Discharge: Continued Medical Work up, No SNF bed     Expected Discharge Plan and Services In-house Referral: Clinical Social Work Discharge Planning Services: NA Post Acute Care  Choice: Home Health Living arrangements for the past 2 months: Single Family Home                       Social Drivers of Health (SDOH) Interventions SDOH Screenings   Food Insecurity: Patient Unable To Answer (10/21/2024)  Housing: Unknown (10/21/2024)  Transportation Needs: Patient Unable To Answer (10/21/2024)  Utilities: Patient Unable To Answer (10/21/2024)  Alcohol Screen: Low Risk (12/30/2022)  Depression (PHQ2-9): Low Risk (07/13/2024)  Financial Resource Strain: Low Risk (12/30/2022)  Physical Activity: Insufficiently Active (12/30/2022)  Social Connections: Patient Unable To Answer (10/21/2024)  Stress: No Stress Concern Present (12/30/2022)  Tobacco Use: High Risk (10/21/2024)    Readmission Risk Interventions    10/25/2024    9:04 AM 10/22/2024    8:57 AM 11/24/2023   10:32 AM  Readmission Risk Prevention Plan  Transportation Screening Complete Complete Complete  PCP or Specialist Appt within 3-5 Days  Complete   Home Care Screening   Complete  Medication Review (RN CM)   Complete  HRI or Home Care Consult Complete Complete   Social Work Consult for Recovery Care Planning/Counseling Complete Complete   Palliative Care Screening Not Applicable Not Applicable   Medication Review Oceanographer) Complete Complete

## 2024-10-25 NOTE — Consult Note (Signed)
 "                                                                                   Consultation Note Date: 10/25/2024   Patient Name: Jamie [name]  DOB: 02-18-1951  MRN: [id number]  Age / Sex: 74 y.o., female  PCP: Lavell Bari LABOR, FNP Referring Physician: Caleen Burgess BROCKS, MD  Reason for Consultation: Establishing goals of care  HPI/Patient Profile: 74 y.o. female  with past medical history of advanced dementia, hypertension, hyperlipidemia, T2DM, COPD admitted on 10/20/2024 with multifocal bacterial community-acquired pneumonia prior to arrival with influenza A, hypernatremia.   Clinical Assessment and Goals of Care: I have reviewed medical records including EPIC notes, labs and imaging, received report from RN, assessed the patient.  Mrs. Jamie [name] is lying quietly in bed.  She appears acutely/chronically ill and quite frail.  She is resting comfortably, but will wake when I touch her arm.  She will briefly make but not keep eye contact.  She is extremely hard of hearing, and does not respond to orientation questions.  Instead she states, I'm fine.  Call to stepdaughter, Jamie [name] per request.  Left voicemail message.  PMT will reach out again. Call to stepdaughter, Jamie [name] per request.  Left voicemail message.  PMT will reach out.   Conference with attending, bedside nursing staff, transition of care team related to patient condition, needs, goals of care, disposition.   HCPOA NEXT OF KIN -spouse, Summers Buendia with the assistance of his daughter Jamie [name].     SUMMARY OF RECOMMENDATIONS   At this point continue to treat the treatable Full scope/full code, need CODE STATUS discussions Anticipate short-term rehab with the ultimate goal of returning home Would benefit from outpatient palliative services   Code Status/Advance Care Planning: Full code -PMT unable to have CODE STATUS discussions with responsible party today.  Will continue to reach out for CODE STATUS and goals of care  discussions.  Symptom Management:  Per hospitalist, no additional needs at this time.  Palliative Prophylaxis:  Oral Care and Turn Reposition  Additional Recommendations (Limitations, Scope, Preferences): Full Scope Treatment  Psycho-social/Spiritual:  Desire for further Chaplaincy support:no Additional Recommendations: Caregiving  Support/Resources and Education on Hospice  Prognosis:  Unable to determine, guarded at this point.  If able to recover, 6 months or less would not be surprising based on chronic illness burden.  Discharge Planning: To Be Determined      Primary Diagnoses: Present on Admission:  Multifocal pneumonia   I have reviewed the medical record, interviewed the patient and family, and examined the patient. The following aspects are pertinent.  Past Medical History:  Diagnosis Date   Anxiety    Breast cancer (HCC)    COPD (chronic obstructive pulmonary disease) (HCC)    Deaf    Dementia (HCC)    Depression    Diabetes (HCC)    HOH (hard of hearing)    HTN (hypertension)    Hyperlipidemia    Personal history of radiation therapy    Sundowning    Vitamin D  deficiency    Social History   Socioeconomic History   Marital status: Married    Spouse name:  Jamie [name]   Number of children: 1   Years of education: 12   Highest education level: High school graduate  Occupational History   Occupation: disability  Tobacco Use   Smoking status: Every Day    Current packs/day: 1.00    Average packs/day: 1 pack/day for 57.2 years (57.2 ttl pk-yrs)    Types: Cigarettes    Start date: 08/11/1967   Smokeless tobacco: Never  Vaping Use   Vaping status: Never Used  Substance and Sexual Activity   Alcohol use: No   Drug use: No   Sexual activity: Not Currently    Birth control/protection: None  Other Topics Concern   Not on file  Social History Narrative   Retired/disabled. Lives at home with her husband. He still works part-time. Currently following  social distancing guidelines.    Social Drivers of Health   Tobacco Use: High Risk (10/25/2024)   Patient History    Smoking Tobacco Use: Every Day    Smokeless Tobacco Use: Never    Passive Exposure: Not on file  Financial Resource Strain: Low Risk (12/30/2022)   Overall Financial Resource Strain (CARDIA)    Difficulty of Paying Living Expenses: Not hard at all  Food Insecurity: Patient Unable To Answer (10/21/2024)   Epic    Worried About Programme Researcher, Broadcasting/film/video in the Last Year: Patient unable to answer    Ran Out of Food in the Last Year: Patient unable to answer  Transportation Needs: Patient Unable To Answer (10/21/2024)   Epic    Lack of Transportation (Medical): Patient unable to answer    Lack of Transportation (Non-Medical): Patient unable to answer  Physical Activity: Insufficiently Active (12/30/2022)   Exercise Vital Sign    Days of Exercise per Week: 3 days    Minutes of Exercise per Session: 30 min  Stress: No Stress Concern Present (12/30/2022)   [employer] of Occupational Health - Occupational Stress Questionnaire    Feeling of Stress : Only a little  Social Connections: Patient Unable To Answer (10/21/2024)   Social Connection and Isolation Panel    Frequency of Communication with Friends and Family: Patient unable to answer    Frequency of Social Gatherings with Friends and Family: Patient unable to answer    Attends Religious Services: Patient unable to answer    Active Member of Clubs or Organizations: Patient unable to answer    Attends Banker Meetings: Patient unable to answer    Marital Status: Patient unable to answer  Depression (PHQ2-9): Low Risk (07/13/2024)   Depression (PHQ2-9)    PHQ-2 Score: 2  Alcohol Screen: Low Risk (12/30/2022)   Alcohol Screen    Last Alcohol Screening Score (AUDIT): 0  Housing: Unknown (10/21/2024)   Epic    Unable to Pay for Housing in the Last Year: Patient unable to answer    Number of Times Moved in the Last  Year: 0    Homeless in the Last Year: Patient unable to answer  Utilities: Patient Unable To Answer (10/21/2024)   Epic    Threatened with loss of utilities: Patient unable to answer  Health Literacy: Not on file   Family History  Problem Relation Age of Onset   Heart attack Mother    Stroke Mother    Stroke Sister    Diabetes Other        sister x3, and mother   Lung disease Father        worked in primary school teacher  Colon cancer Neg Hx    Colon polyps Neg Hx    Scheduled Meds:  amLODipine   10 mg Oral Daily   amoxicillin -clavulanate  1 tablet Oral Q12H   budesonide -glycopyrrolate -formoterol   2 puff Inhalation BID   Chlorhexidine  Gluconate Cloth  6 each Topical Q0600   donepezil   10 mg Oral QHS   doxycycline   100 mg Oral Q12H   feeding supplement  237 mL Oral BID BM   fentaNYL  (SUBLIMAZE ) injection  50 mcg Intravenous Once   heparin   5,000 Units Subcutaneous Q8H   insulin  aspart  0-5 Units Subcutaneous QHS   insulin  aspart  0-9 Units Subcutaneous TID WC   insulin  glargine  7 Units Subcutaneous QHS   memantine   10 mg Oral BID   multivitamin with minerals  1 tablet Oral Daily   pravastatin   40 mg Oral Daily   Continuous Infusions: PRN Meds:.acetaminophen  **OR** acetaminophen , dextromethorphan -guaiFENesin , glucagon  (human recombinant), hydrALAZINE , ipratropium-albuterol , metoprolol  tartrate, ondansetron  **OR** ondansetron  (ZOFRAN ) IV Medications Prior to Admission:  Prior to Admission medications  Medication Sig Start Date End Date Taking? Authorizing Provider  Accu-Chek Softclix Lancets lancets Use to check blood sugar two times daily  Dx E11.9 07/18/21   Lavell Lye A, FNP  albuterol  (PROVENTIL ) (2.5 MG/3ML) 0.083% nebulizer solution Take 3 mLs (2.5 mg total) by nebulization every 6 (six) hours as needed for wheezing or shortness of breath. 07/13/24   Lavell Lye LABOR, FNP  Alcohol Swabs (B-D SINGLE USE SWABS REGULAR) PADS Test BS BID Dx E11.9 08/02/19   Lavell Lye A, FNP   amLODipine  (NORVASC ) 10 MG tablet Take 1 tablet (10 mg total) by mouth daily. 07/13/24   Lavell Lye LABOR, FNP  amoxicillin -clavulanate (AUGMENTIN ) 875-125 MG tablet Take 1 tablet by mouth 2 (two) times daily. Take all of this medication 02/24/24   Zollie Lowers, MD  aspirin  EC 81 MG tablet Take 1 tablet (81 mg total) by mouth daily with breakfast. Swallow whole. 07/13/24   Lavell Lye LABOR, FNP  Blood Glucose Calibration (ACCU-CHEK AVIVA) SOLN Use with glucose machine Dx E11.9 08/02/19   Lavell Lye A, FNP  Blood Glucose Monitoring Suppl (ACCU-CHEK AVIVA PLUS) w/Device KIT Check BS BID Dx E11.9 08/02/19   Lavell Lye A, FNP  Blood Glucose Monitoring Suppl DEVI 1 each by Does not apply route in the morning, at noon, and at bedtime. May substitute to any manufacturer covered by patient's insurance. 01/06/24   Lavell Lye LABOR, FNP  Continuous Glucose Receiver (FREESTYLE LIBRE 3 READER) DEVI 1 each by Does not apply route daily. 12/01/23   Gladis Mary-Margaret, FNP  Continuous Glucose Sensor (FREESTYLE LIBRE 3 SENSOR) MISC CHANGE SENSOR EVERY 14 DAYS AS DIRECTED. USE TO CHECK GLUCOSE CONTINUOUSLY 02/11/24   Hawks, Lye A, FNP  donepezil  (ARICEPT ) 10 MG tablet Take 1 tablet (10 mg total) by mouth at bedtime. 07/02/24   Lavell Lye LABOR, FNP  Fluticasone -Umeclidin-Vilant (TRELEGY ELLIPTA ) 100-62.5-25 MCG/ACT AEPB One click each am 11/25/23   Pearlean Manus, MD  glucose blood (ACCU-CHEK GUIDE) test strip Use to check blood sugar two times daily  Dx E11.9 12/25/21   Lavell Lye LABOR, FNP  glucose-Vitamin C (GLUCOSE INSTANT ENERGY) 4-0.006 GM CHEW chewable tablet Chew 1 tablet by mouth as needed for low blood sugar. 07/13/24   Lavell Lye LABOR, FNP  insulin  glargine (LANTUS  SOLOSTAR) 100 UNIT/ML Solostar Pen Inject 18 Units into the skin at bedtime. 07/13/24   Lavell Lye A, FNP  Insulin  Pen Needle (DROPLET PEN NEEDLES) 32G X 5 MM  MISC Use with insulin  daily Dx E11.9 10/03/21   Lavell Lye A, FNP   losartan  (COZAAR ) 100 MG tablet Take 1 tablet (100 mg total) by mouth daily. 07/13/24   Lavell Lye A, FNP  memantine  (NAMENDA ) 10 MG tablet Take 1 tablet (10 mg total) by mouth 2 (two) times daily. 07/13/24   Lavell Lye A, FNP  pravastatin  (PRAVACHOL ) 40 MG tablet Take 1 tablet (40 mg total) by mouth daily. 07/13/24   Lavell Lye LABOR, FNP  risperiDONE  (RISPERDAL ) 0.5 MG tablet Take 1 tablet (0.5 mg total) by mouth at bedtime. 11/25/23   Pearlean Manus, MD   Allergies[1] Review of Systems  Unable to perform ROS: Dementia    Physical Exam Vitals and nursing note reviewed.  Constitutional:      General: She is not in acute distress.    Appearance: She is ill-appearing.     Comments: HOH  Cardiovascular:     Rate and Rhythm: Normal rate.  Pulmonary:     Effort: Pulmonary effort is normal. No respiratory distress.  Neurological:     Mental Status: She is alert.     Comments: Known dementia, unable to tell me her name  Psychiatric:     Comments: Calm and cooperative     Vital Signs: BP (!) 147/98 (BP Location: Right Arm)   Pulse 72   Temp 98.2 F (36.8 C) (Oral)   Resp (!) 21   Ht 5' (1.524 m)   Wt 35.5 kg   SpO2 98%   BMI 15.28 kg/m  Pain Scale: PAINAD POSS *See Group Information*: S-Acceptable,Sleep, easy to arouse Pain Score: 0-No pain   SpO2: SpO2: 98 % O2 Device:SpO2: 98 % O2 Flow Rate: .   IO: Intake/output summary:  Intake/Output Summary (Last 24 hours) at 10/25/2024 1309 Last data filed at 10/25/2024 9372 Gross per 24 hour  Intake 1612.13 ml  Output 800 ml  Net 812.13 ml    LBM: Last BM Date : 10/22/24 Baseline Weight: Weight: 39.9 kg Most recent weight: Weight: 35.5 kg     Palliative Assessment/Data:     Time In: 1000 Time Out: 1055 Time Total: 55 minutes  Greater than 50%  of this time was spent counseling and coordinating care related to the above assessment and plan.  Signed by: Lorenza LABOR Birkenhead, NP   Please contact Palliative Medicine  Team phone at [phone] for questions and concerns.  For individual provider: See Amion     [1]  Allergies Allergen Reactions   Lipitor [Atorvastatin ] Other (See Comments)    Constipation    Soma [Carisoprodol] Nausea And Vomiting   "

## 2024-10-26 DIAGNOSIS — J188 Other pneumonia, unspecified organism: Secondary | ICD-10-CM | POA: Diagnosis not present

## 2024-10-26 LAB — CULTURE, BLOOD (ROUTINE X 2)
Culture: NO GROWTH
Culture: NO GROWTH
Special Requests: ADEQUATE
Special Requests: ADEQUATE

## 2024-10-26 LAB — BASIC METABOLIC PANEL WITH GFR
Anion gap: 8 (ref 5–15)
BUN: 18 mg/dL (ref 8–23)
CO2: 27 mmol/L (ref 22–32)
Calcium: 8.4 mg/dL — ABNORMAL LOW (ref 8.9–10.3)
Chloride: 104 mmol/L (ref 98–111)
Creatinine, Ser: 1.17 mg/dL — ABNORMAL HIGH (ref 0.44–1.00)
GFR, Estimated: 49 mL/min — ABNORMAL LOW
Glucose, Bld: 202 mg/dL — ABNORMAL HIGH (ref 70–99)
Potassium: 4.1 mmol/L (ref 3.5–5.1)
Sodium: 139 mmol/L (ref 135–145)

## 2024-10-26 LAB — GLUCOSE, CAPILLARY
Glucose-Capillary: 208 mg/dL — ABNORMAL HIGH (ref 70–99)
Glucose-Capillary: 337 mg/dL — ABNORMAL HIGH (ref 70–99)
Glucose-Capillary: 334 mg/dL — ABNORMAL HIGH (ref 70–99)
Glucose-Capillary: 280 mg/dL — ABNORMAL HIGH (ref 70–99)

## 2024-10-26 LAB — CBC
HCT: 35.9 % — ABNORMAL LOW (ref 36.0–46.0)
Hemoglobin: 11.5 g/dL — ABNORMAL LOW (ref 12.0–15.0)
MCH: 26.7 pg (ref 26.0–34.0)
MCHC: 32 g/dL (ref 30.0–36.0)
MCV: 83.5 fL (ref 80.0–100.0)
Platelets: 227 K/uL (ref 150–400)
RBC: 4.3 MIL/uL (ref 3.87–5.11)
RDW: 13.7 % (ref 11.5–15.5)
WBC: 8.1 K/uL (ref 4.0–10.5)
nRBC: 0 % (ref 0.0–0.2)

## 2024-10-26 LAB — MAGNESIUM: Magnesium: 1.9 mg/dL (ref 1.7–2.4)

## 2024-10-26 LAB — PHOSPHORUS: Phosphorus: 2.5 mg/dL (ref 2.5–4.6)

## 2024-10-26 MED ORDER — INSULIN GLARGINE 100 UNIT/ML ~~LOC~~ SOLN
12.0000 [IU] | Freq: Every day | SUBCUTANEOUS | Status: DC
  Administered 2024-10-26 – 2024-10-31 (×5): 12 [IU] via SUBCUTANEOUS
  Filled 2024-10-26 (×7): qty 0.12

## 2024-10-26 MED ORDER — AMOXICILLIN-POT CLAVULANATE 500-125 MG PO TABS: 1.0000 | ORAL_TABLET | Freq: Two times a day (BID) | ORAL | 0 refills | Status: DC

## 2024-10-26 NOTE — Evaluation (Signed)
 Occupational Therapy Evaluation Patient Details Name: Jamie [name] MRN: [id number] DOB: 06-01-1951 Today's Date: 10/26/2024   History of Present Illness   Jamie [name] is a 74 y.o. female with medical history significant of hypertension, hyperlipidemia, T2DM, COPD, dementia who presents to the emergency department from home via EMS due to generalized weakness.  She lives with her husband (her provider) who was recently admitted to the hospital due to flu and just discharged home.  Daughter came to visit and noted that patient was weak and was more confused than usual, so she activated EMS, on arrival of EMS team, she was noted to be hypotensive, this later self- improved with IV hydration was provided PTA. (per DO)     Clinical Impressions Pt agreeable to OT evaluation. Pt no fully oriented and was inconsistent with following direction. Mod A for bed mobility and EOB to chair transfer with RW. Difficult to assess B UE strength due to cognition issues. Pt unable to don socks without max A seated at EOB. Pt left in the chair with call bell within reach and chair alarm set. Pt will benefit from continued OT in the hospital to increase strength, balance, and endurance for safe ADL's.        If plan is discharge home, recommend the following:   A lot of help with walking and/or transfers;A lot of help with bathing/dressing/bathroom;Assistance with cooking/housework;Direct supervision/assist for medications management;Assist for transportation;Help with stairs or ramp for entrance;Assistance with feeding;Supervision due to cognitive status     Functional Status Assessment   Patient has had a recent decline in their functional status and demonstrates the ability to make significant improvements in function in a reasonable and predictable amount of time.     Equipment Recommendations   None recommended by OT             Precautions/Restrictions   Precautions Precautions:  Fall Recall of Precautions/Restrictions: Impaired Restrictions Weight Bearing Restrictions Per Provider Order: No     Mobility Bed Mobility Overal bed mobility: Needs Assistance Bed Mobility: Supine to Sit     Supine to sit: Mod assist     General bed mobility comments: Assist to scoot B LE to EOB and for trunk control.    Transfers Overall transfer level: Needs assistance Equipment used: Rolling walker (2 wheels) Transfers: Sit to/from Stand, Bed to chair/wheelchair/BSC Sit to Stand: Mod assist     Step pivot transfers: Mod assist     General transfer comment: EOB to chair with RW.      Balance Overall balance assessment: Needs assistance Sitting-balance support: No upper extremity supported, Feet supported Sitting balance-Leahy Scale: Fair Sitting balance - Comments: seated at EOB   Standing balance support: Bilateral upper extremity supported, During functional activity, Reliant on assistive device for balance Standing balance-Leahy Scale: Poor Standing balance comment: using RW                           ADL either performed or assessed with clinical judgement   ADL Overall ADL's : Needs assistance/impaired Eating/Feeding: Minimal assistance;Moderate assistance;Sitting   Grooming: Moderate assistance;Sitting   Upper Body Bathing: Moderate assistance;Minimal assistance;Sitting   Lower Body Bathing: Maximal assistance;Sitting/lateral leans   Upper Body Dressing : Moderate assistance;Minimal assistance;Sitting   Lower Body Dressing: Maximal assistance;Sitting/lateral leans   Toilet Transfer: Moderate assistance;Rolling walker (2 wheels);Stand-pivot Statistician Details (indicate cue type and reason): EOB to chair with RW. Toileting- Clothing Manipulation and Hygiene: Maximal  assistance;Total assistance;Bed level         General ADL Comments: ADL status limited by cognition as well as physical limitations.     Vision Patient Visual  Report:  (Unsure of baseline vision.) Vision Assessment?: No apparent visual deficits     Perception Perception: Not tested       Praxis Praxis: Not tested       Pertinent Vitals/Pain Pain Assessment Pain Assessment: Faces Faces Pain Scale: Hurts little more Pain Location: general pain with mobility Pain Descriptors / Indicators: Grimacing, Moaning Pain Intervention(s): Monitored during session, Repositioned, Limited activity within patient's tolerance     Extremity/Trunk Assessment Upper Extremity Assessment Upper Extremity Assessment: Difficult to assess due to impaired cognition;Generalized weakness   Lower Extremity Assessment Lower Extremity Assessment: Defer to PT evaluation   Cervical / Trunk Assessment Cervical / Trunk Assessment: Kyphotic   Communication Communication Communication: No apparent difficulties   Cognition Arousal: Alert Behavior During Therapy: WFL for tasks assessed/performed Cognition: History of cognitive impairments             OT - Cognition Comments: Minimal verbal communication; difficult following commands.                 Following commands: Impaired Following commands impaired: Follows one step commands inconsistently     Cueing  General Comments   Cueing Techniques: Verbal cues;Tactile cues;Visual cues                 Home Living Family/patient expects to be discharged to:: Private residence Living Arrangements: Spouse/significant other Available Help at Discharge: Family;Available 24 hours/day Type of Home: House Home Access: Stairs to enter [employer] of Steps: 3 Entrance Stairs-Rails: None Home Layout: One level     Bathroom Shower/Tub: Chief Strategy Officer: Standard Bathroom Accessibility: Yes   Home Equipment: Agricultural Consultant (2 wheels)   Additional Comments: Per chart.      Prior Functioning/Environment Prior Level of Function : Needs assist;Patient poor  historian/Family not available       Physical Assist : Mobility (physical);ADLs (physical) Mobility (physical): Bed mobility;Transfers;Gait;Stairs   Mobility Comments: Household ambuation using RW PRN (per chart) ADLs Comments: Assisted by family (per chart)    OT Problem List: Decreased strength;Decreased activity tolerance;Decreased range of motion;Impaired balance (sitting and/or standing);Decreased cognition   OT Treatment/Interventions: Self-care/ADL training;Therapeutic exercise;DME and/or AE instruction;Therapeutic activities;Cognitive remediation/compensation;Patient/family education;Balance training      OT Goals(Current goals can be found in the care plan section)   Acute Rehab OT Goals Patient Stated Goal: None stated OT Goal Formulation: Patient unable to participate in goal setting Time For Goal Achievement: 11/09/24 Potential to Achieve Goals: Fair   OT Frequency:  Min 2X/week                                   End of Session Equipment Utilized During Treatment: Rolling walker (2 wheels);Gait belt Nurse Communication: Other (comment) (NT notfied pt would likely need help to eat.)  Activity Tolerance: Patient tolerated treatment well Patient left: in chair;with call bell/phone within reach;with chair alarm set  OT Visit Diagnosis: Unsteadiness on feet (R26.81);Other abnormalities of gait and mobility (R26.89);Muscle weakness (generalized) (M62.81)                Time: 9157-9092 OT Time Calculation (min): 25 min Charges:  OT General Charges $OT Visit: 1 Visit OT Evaluation $OT Eval Low Complexity: 1 Low  Shyrl Obi  Tobe Kervin OT, MOT  Jayson Person 10/26/2024, 12:11 PM

## 2024-10-26 NOTE — Plan of Care (Signed)
  Problem: Clinical Measurements: Goal: Respiratory complications will improve Outcome: Progressing Goal: Cardiovascular complication will be avoided Outcome: Progressing   Problem: Coping: Goal: Level of anxiety will decrease Outcome: Progressing   Problem: Elimination: Goal: Will not experience complications related to urinary retention Outcome: Progressing   Problem: Pain Managment: Goal: General experience of comfort will improve and/or be controlled Outcome: Progressing   Problem: Safety: Goal: Ability to remain free from injury will improve Outcome: Progressing   Problem: Skin Integrity: Goal: Risk for impaired skin integrity will decrease Outcome: Progressing

## 2024-10-26 NOTE — TOC Transition Note (Addendum)
 Transition of Care [facility]) - Discharge Note   Patient Details  Name: Jamie [name] MRN: [id number] Date of Birth: 1951-05-28  Transition of Care [facility] CM/SW Contact:  Jamie DELENA Bigness, LCSW Phone Number: 10/26/2024, 10:45 AM   Clinical Narrative:    CSW spoke with pt's step-daughter, Etta via t/c to confirm plan for pt to return home with home health. Etta has accepted services with [facility] as pt's spouse is already receiving [facility] services with this agency. Orders have been placed for HHPT/OT/RN/Aide. CSW updated Delon w/ [facility] on plan for DC today.   UPDATE 3:00pm: Family unable to take pt home and now requesting pt transfer to SNF for STR. [facility] has been updated on change in plans. CSW reviewed bed offer with pt's husband and stepdaughter who have accepted bed at [facility]. Insurance auth requested and approved for SNF. [facility] unable to accept until Thursday.   Final next level of care: Home w Home Health Services Barriers to Discharge: Barriers Resolved   Patient Goals and CMS Choice Patient states their goals for this hospitalization and ongoing recovery are:: For pt to be able to return home CMS Medicare.gov Compare Post Acute Care list provided to:: Patient Represenative (must comment) Choice offered to / list presented to : Adult Children [place] ownership interest in [facility].provided to:: Adult Children    Discharge Placement                       Discharge Plan and Services Additional resources added to the After Visit Summary for   In-house Referral: Clinical Social Work Discharge Planning Services: NA Post Acute Care Choice: Home Health          DME Arranged: N/A DME Agency: NA       HH Arranged: RN, PT, OT, Nurse's Aide HH Agency: [facility]        Social Drivers of Health (SDOH) Interventions SDOH Screenings   Food Insecurity: Patient Unable To Answer (10/21/2024)  Housing: Unknown (10/21/2024)   Transportation Needs: Patient Unable To Answer (10/21/2024)  Utilities: Patient Unable To Answer (10/21/2024)  Alcohol Screen: Low Risk (12/30/2022)  Depression (PHQ2-9): Low Risk (07/13/2024)  Financial Resource Strain: Low Risk (12/30/2022)  Physical Activity: Insufficiently Active (12/30/2022)  Social Connections: Patient Unable To Answer (10/21/2024)  Stress: No Stress Concern Present (12/30/2022)  Tobacco Use: High Risk (10/25/2024)     Readmission Risk Interventions    10/25/2024    9:04 AM 10/22/2024    8:57 AM 11/24/2023   10:32 AM  Readmission Risk Prevention Plan  Transportation Screening Complete Complete Complete  PCP or Specialist Appt within 3-5 Days  Complete   Home Care Screening   Complete  Medication Review (RN CM)   Complete  HRI or Home Care Consult Complete Complete   Social Work Consult for Recovery Care Planning/Counseling Complete Complete   Palliative Care Screening Not Applicable Not Applicable   Medication Review Oceanographer) Complete Complete

## 2024-10-26 NOTE — Progress Notes (Signed)
 Palliative: Chart review completed.  Jamie [name] is to discharge home with home health today.   Face-to-face discussion with transition of care team.  No needs identified at this time.  No charge Lorenza Birkenhead, NP  Palliative medicine team Team phone [phone]

## 2024-10-26 NOTE — Plan of Care (Signed)
" °  Problem: Acute Rehab OT Goals (only OT should resolve) Goal: Pt. Will Perform Eating Flowsheets (Taken 10/26/2024 1212) Pt Will Perform Eating:  with supervision  sitting Goal: Pt. Will Perform Grooming Flowsheets (Taken 10/26/2024 1212) Pt Will Perform Grooming: with supervision Goal: Pt. Will Perform Upper Body Dressing Flowsheets (Taken 10/26/2024 1212) Pt Will Perform Upper Body Dressing: with supervision Goal: Pt. Will Perform Lower Body Dressing Flowsheets (Taken 10/26/2024 1212) Pt Will Perform Lower Body Dressing: with contact guard assist Goal: Pt. Will Transfer To Toilet Flowsheets (Taken 10/26/2024 1212) Pt Will Transfer to Toilet:  with supervision  stand pivot transfer Goal: Pt. Will Perform Toileting-Clothing Manipulation Flowsheets (Taken 10/26/2024 1212) Pt Will Perform Toileting - Clothing Manipulation and hygiene: with supervision Goal: Pt/Caregiver Will Perform Home Exercise Program Flowsheets (Taken 10/26/2024 1212) Pt/caregiver will Perform Home Exercise Program:  Increased strength  Increased ROM  Both right and left upper extremity  With minimal assist  With Supervision  Chevelle Durr OT, MOT  "

## 2024-10-26 NOTE — Care Management Important Message (Signed)
 Important Message  Patient Details  Name: Jamie [name] MRN: [id number] Date of Birth: January 04, 1951   Important Message Given:  Yes - Medicare IM     Lawsyn Heiler L Kennice Finnie 10/26/2024, 11:15 AM

## 2024-10-26 NOTE — Discharge Summary (Addendum)
 Physician Discharge Summary  Jamie [name] FMW:[id number] DOB: [birth year] DOA: 10/20/2024  PCP: Lavell Bari LABOR, FNP  Admit date: 10/20/2024 Discharge date: 10/26/2024  Admitted From: Home Disposition: SNF  Recommendations for Outpatient Follow-up:  Follow up with PCP in 1-2 weeks Please obtain BMP/CBC in 3-5 days Recommend outpatient follow-up with palliative care services 1 more day of p.o. Augmentin    Discharge Condition: Stable CODE STATUS: Full code Diet recommendation: Diet cardiac  Brief/Interim Summary: Brief Narrative:   74 y.o. female with medical history significant of hypertension, advanced dementia, hyperlipidemia, T2DM, COPD, dementia who presents to the emergency department from home via EMS due to generalized weakness and confusion.  Upon admission patient was found to have multifocal community-acquired pneumonia secondary to influenza A infection complicated by dehydration, hypernatremia, AKI, UTI and failure to thrive. Eventually she started slowly improving.  Due to her advanced dementia and comorbidities, had palliative care discussion and patient and family opted for to continue medical treatment.  PT OT recommended SNF  Assessment & Plan:  Multifocal bacterial community-acquired pneumonia, POA Influenza A  Improving.  Will transition to 1 more day of p.o. Augmentin .  Completed 5 days of Tamiflu .  Continue home bronchodilators.   Hypernatremia,POA Acute kidney injury with metabolic acidosis Adult failure to thrive with severe malnutrition - Advised oral hydration at home.  Recommended to recheck BMP outpatient in next 5 to 7 days.  Antihypertensive losartan  may need to be adjusted depending on renal function and her oral intake -Unfortunately due to advanced dementia her p.o. intake remains poor.  Encourage p.o. nutrition - Palliative care team to continue following outpatient   UTI: Treated with antibiotics    Type 2 diabetes mellitus with hyperglycemia,  POA:  A1c on 07/13/2024 was 7.7%.   Sliding scale and Accu-Chek and long-acting.  Adjust as necessary     COPD, POA:  - no current exacerbation.  Continue with albuterol  and Breztri . - hasn't had any albuterol  b/c not aware enough to ask for it.  Lungs sound good, no evidence of exacerbation currently.    Alzheimer's dementia, POA: Continue with Aricept  and Namenda  - advanced, complicating care   Hyperlipidemia, POA:  Continue with statin   Hypertension, POA:  Continue with amlodipine .  Holding losartan  in the setting of acute kidney injury. IV as needed  Seen by palliative care service   DVT prophylaxis: Subcu heparin     Code Status: Full Code Family Communication:   Status is: Inpatient Remains inpatient appropriate because: Discharge    PT Follow up Recs: Skilled Nursing-Short Term Rehab (<3 Hours/Day)10/24/2024 1116  Subjective:  No new complaints.  Sitting up in the recliner wants to be settled in the bed  Examination:  General exam: Appears calm and comfortable, elderly frail Respiratory system: Clear to auscultation. Respiratory effort normal. Cardiovascular system: S1 & S2 heard, RRR. No JVD, murmurs, rubs, gallops or clicks. No pedal edema. Gastrointestinal system: Abdomen is nondistended, soft and nontender. No organomegaly or masses felt. Normal bowel sounds heard. Central nervous system: Difficult to assess Extremities: Symmetric 5 x 5 power. Skin: No rashes, lesions or ulcers Psychiatry: Unable to assess    Discharge Diagnoses:  Principal Problem:   Multifocal pneumonia Active Problems:   Protein-calorie malnutrition, severe      Discharge Exam: Vitals:   10/26/24 0740 10/26/24 0927  BP:  116/60  Pulse:  95  Resp:    Temp:  98.3 F (36.8 C)  SpO2: 96% 92%   Vitals:   10/26/24 0014 10/26/24 0418  10/26/24 0740 10/26/24 0927  BP: 128/71 113/61  116/60  Pulse: 92 96  95  Resp: 18 18    Temp: 99.3 F (37.4 C) 98.3 F (36.8 C)  98.3 F  (36.8 C)  TempSrc: Oral   Oral  SpO2: 94% 96% 96% 92%  Weight:      Height:          Discharge Instructions   Allergies as of 10/26/2024       Reactions   Lipitor [atorvastatin ] Other (See Comments)   Constipation    Soma [carisoprodol] Nausea And Vomiting        Medication List     STOP taking these medications    risperiDONE  0.5 MG tablet Commonly known as: RISPERDAL        TAKE these medications    albuterol  (2.5 MG/3ML) 0.083% nebulizer solution Commonly known as: PROVENTIL  Take 3 mLs (2.5 mg total) by nebulization every 6 (six) hours as needed for wheezing or shortness of breath.   amLODipine  10 MG tablet Commonly known as: NORVASC  Take 1 tablet (10 mg total) by mouth daily.   amoxicillin -clavulanate 500-125 MG tablet Commonly known as: AUGMENTIN  Take 1 tablet by mouth every 12 (twelve) hours for 1 day.   aspirin  EC 81 MG tablet Take 1 tablet (81 mg total) by mouth daily with breakfast. Swallow whole.   donepezil  10 MG tablet Commonly known as: ARICEPT  Take 1 tablet (10 mg total) by mouth at bedtime.   Glucose Instant Energy 4-6 GM-MG Chew chewable tablet Generic drug: glucose-Vitamin C Chew 1 tablet by mouth as needed for low blood sugar.   Lantus  SoloStar 100 UNIT/ML Solostar Pen Generic drug: insulin  glargine Inject 18 Units into the skin at bedtime.   losartan  100 MG tablet Commonly known as: COZAAR  Take 1 tablet (100 mg total) by mouth daily.   memantine  10 MG tablet Commonly known as: NAMENDA  Take 1 tablet (10 mg total) by mouth 2 (two) times daily.   pravastatin  40 MG tablet Commonly known as: PRAVACHOL  Take 1 tablet (40 mg total) by mouth daily.   Trelegy Ellipta  100-62.5-25 MCG/ACT Aepb Generic drug: Fluticasone -Umeclidin-Vilant One click each am        Contact information for after-discharge care     Destination     [facility] .   Service: Skilled Nursing Contact  information: [address] [place]  [address] [phone]             Home Medical Care     [facility]) .   Service: Home Health Services Contact information: [address] [place]  [address] [phone]                    Allergies[1]  You were cared for by a hospitalist during your hospital stay. If you have any questions about your discharge medications or the care you received while you were in the hospital after you are discharged, you can call the unit and asked to speak with the hospitalist on call if the hospitalist that took care of you is not available. Once you are discharged, your primary care physician will handle any further medical issues. Please note that no refills for any discharge medications will be authorized once you are discharged, as it is imperative that you return to your primary care physician (or establish a relationship with a primary care physician if you do not have one) for your  aftercare needs so that they can reassess your need for medications and monitor your lab values.  You were cared for by a hospitalist during your hospital stay. If you have any questions about your discharge medications or the care you received while you were in the hospital after you are discharged, you can call the unit and asked to speak with the hospitalist on call if the hospitalist that took care of you is not available. Once you are discharged, your primary care physician will handle any further medical issues. Please note that NO REFILLS for any discharge medications will be authorized once you are discharged, as it is imperative that you return to your primary care physician (or establish a relationship with a primary care physician if you do not have one) for your aftercare needs so that they can reassess your need for medications and monitor your lab values.  Please request your  Prim.MD to go over all Hospital Tests and Procedure/Radiological results at the follow up, please get all Hospital records sent to your Prim MD by signing hospital release before you go home.  Get CBC, CMP, 2 view Chest X ray checked  by Primary MD during your next visit or SNF MD in 5-7 days ( we routinely change or add medications that can affect your baseline labs and fluid status, therefore we recommend that you get the mentioned basic workup next visit with your PCP, your PCP may decide not to get them or add new tests based on their clinical decision)  On your next visit with your primary care physician please Get Medicines reviewed and adjusted.  If you experience worsening of your admission symptoms, develop shortness of breath, life threatening emergency, suicidal or homicidal thoughts you must seek medical attention immediately by calling 911 or calling your MD immediately  if symptoms less severe.  You Must read complete instructions/literature along with all the possible adverse reactions/side effects for all the Medicines you take and that have been prescribed to you. Take any new Medicines after you have completely understood and accpet all the possible adverse reactions/side effects.   Do not drive, operate heavy machinery, perform activities at heights, swimming or participation in water activities or provide baby sitting services if your were admitted for syncope or siezures until you have seen by Primary MD or a Neurologist and advised to do so again.  Do not drive when taking Pain medications.   Procedures/Studies: CT Cervical Spine Wo Contrast Result Date: 10/20/2024 EXAM: CT CERVICAL SPINE WITHOUT CONTRAST 10/20/2024 08:45:38 PM TECHNIQUE: CT of the cervical spine was performed without the administration of intravenous contrast. Multiplanar reformatted images are provided for review. Automated exposure control, iterative reconstruction, and/or weight based adjustment of the mA/kV  was utilized to reduce the radiation dose to as low as reasonably achievable. COMPARISON: 11/23/2023 CLINICAL HISTORY: Neck trauma (Age >= 65y) FINDINGS: BONES AND ALIGNMENT: No acute fracture or traumatic malalignment. DEGENERATIVE CHANGES: Prior ACDF at C6-C7. Diffuse degenerative disc and facet disease. SOFT TISSUES: No prevertebral soft tissue swelling. IMPRESSION: 1. No acute findings. 2. Postoperative and degenerative changes. Electronically signed by: Franky Crease MD MD 10/20/2024 09:08 PM EST RP Workstation: [id number]   CT Head Wo Contrast Result Date: 10/20/2024 EXAM: CT HEAD WITHOUT CONTRAST 10/20/2024 08:45:38 PM TECHNIQUE: CT of the head was performed without the administration of intravenous contrast. Automated exposure control, iterative reconstruction, and/or weight based adjustment of the mA/kV was utilized to reduce the radiation dose to as low as reasonably achievable. COMPARISON:  11/23/2023 CLINICAL HISTORY: Mental status change, unknown cause. FINDINGS: BRAIN AND VENTRICLES: No acute hemorrhage. No evidence of acute infarct. No hydrocephalus. No extra-axial collection. No mass effect or midline shift. Atrophy and chronic small vessel disease throughout the deep white matter. ORBITS: No acute abnormality. SINUSES: No acute abnormality. SOFT TISSUES AND SKULL: No acute soft tissue abnormality. No skull fracture. IMPRESSION: 1. No acute intracranial abnormality. 2. Atrophy, chronic small vessel disease. Electronically signed by: Franky Crease MD MD 10/20/2024 09:06 PM EST RP Workstation: [id number]   CT CHEST ABDOMEN PELVIS WO CONTRAST Result Date: 10/20/2024 EXAM: CT CHEST, ABDOMEN AND PELVIS WITHOUT CONTRAST 10/20/2024 08:45:38 PM TECHNIQUE: CT of the chest, abdomen and pelvis was performed without the administration of intravenous contrast. Multiplanar reformatted images are provided for review. Automated exposure control, iterative reconstruction, and/or weight based adjustment of the mA/kV  was utilized to reduce the radiation dose to as low as reasonably achievable. COMPARISON: Chest radiograph 10/20/2024, CT abdomen and pelvis 12/31/2014, CT chest 08/25/2013, and CT thoracolumbar spine 11/23/2023. CLINICAL HISTORY: Polytrauma, blunt. Weakness and altered mental status. FINDINGS: CHEST: MEDIASTINUM AND LYMPH NODES: Heart and pericardium are unremarkable. Normal heart size. No pericardial effusion. The central airways are clear. No mediastinal, hilar or axillary lymphadenopathy. The thyroid  gland is unremarkable. The esophagus is decompressed. Calcification of the aorta and coronary arteries. No aortic aneurysm. LUNGS AND PLEURA: Emphysematous changes diffusely throughout the lungs. Bronchiectasis with bronchial wall thickening. Multiple nodular peribronchial infiltrates bilaterally. These changes are likely to represent chronic bronchitis with acute multifocal bronchopneumonia versus aspiration. No pleural effusion or pneumothorax. ABDOMEN AND PELVIS: LIMITATIONS/ARTIFACTS: Streak artifact from arms and hardware-limited evaluation of the abdomen and pelvis. LIVER: As visualized, the liver is unremarkable. GALLBLADDER AND BILE DUCTS: As visualized, the gallbladder is unremarkable. No biliary ductal dilatation. SPLEEN: As visualized, the spleen is unremarkable. PANCREAS: As visualized, the pancreas is unremarkable. ADRENAL GLANDS: As visualized, the adrenal glands are unremarkable. KIDNEYS, URETERS AND BLADDER: As visualized, the kidneys and ureters are unremarkable. No stones in the kidneys or ureters. No hydronephrosis. No perinephric or periureteral stranding. Gas in the bladder. This could arise from catheterization if performed recently or from infection. Correlate with urinalysis. GI AND BOWEL: Stomach, small bowel, and the colon are mostly decompressed. There is no bowel obstruction. REPRODUCTIVE ORGANS: As visualized, the uterus and ovaries are unremarkable. PERITONEUM AND RETROPERITONEUM: No  ascites. No free air. VASCULATURE: Aorta is normal in caliber. Dense calcification of the abdominal aorta. No aneurysm. ABDOMINAL AND PELVIS LYMPH NODES: No lymphadenopathy. BONES AND SOFT TISSUES: Postoperative posterior rod and screw fixation from T12 through L4. Anterior fixation of the cervical spine. Degenerative changes throughout the lumbar spine and cervical spine with narrowed interspaces and endplate osteophyte formation. Visualized surgical hardware appears intact. No acute bony abnormalities. Infiltration in the anterior abdominal wall subcutaneous fat consistent with injection sites. Circumscribed cystic lesion in the left pelvic subcutaneous fat measuring 2.2 cm in diameter, likely a sebaceous cyst. Calcified phleboliths in the pelvis. IMPRESSION: 1. No evidence of acute traumatic injury. 2. Multifocal bronchopneumonia versus aspiration. Chronic bronchitic changes with bronchiectasis. 3. Gas in the urinary bladder, which may be related to recent catheterization or infection; recommend urinalysis. Electronically signed by: Elsie Gravely MD 10/20/2024 08:56 PM EST RP Workstation: [id number]   DG Chest Port 1 View Result Date: 10/20/2024 EXAM: 1 VIEW(S) XRAY OF THE CHEST 10/20/2024 07:52:00 PM COMPARISON: CT of the chest 08/25/2013. CLINICAL HISTORY: Questionable sepsis - evaluate for abnormality. FINDINGS: LUNGS AND PLEURA: Lungs are hyperinflated,  likely related to emphysema. A small nodular density projects over the left lower lung measuring 5 mm which is indeterminate. No pleural effusion. No pneumothorax. HEART AND MEDIASTINUM: Atherosclerotic calcifications of the aorta. No acute abnormality of the cardiac and mediastinal silhouettes. BONES AND SOFT TISSUES: Thoracic and cervical spinal fusion hardware present. No acute osseous abnormality. IMPRESSION: 1. No acute findings. 2. Hyperinflated lungs, likely related to emphysema. 3. Small indeterminate 5 mm nodular density projecting over the left  lower lung. Follow-up chest CT recommended. Electronically signed by: Greig Pique MD MD 10/20/2024 07:59 PM EST RP Workstation: [id number]     The results of significant diagnostics from this hospitalization (including imaging, microbiology, ancillary and laboratory) are listed below for reference.     Microbiology: Recent Results (from the past 240 hours)  Resp panel by RT-PCR (RSV, Flu A&B, Covid) Anterior Nasal Swab     Status: Abnormal   Collection Time: 10/20/24  7:36 PM   Specimen: Anterior Nasal Swab  Result Value Ref Range Status   SARS Coronavirus 2 by RT PCR NEGATIVE NEGATIVE Final    Comment: (NOTE) SARS-CoV-2 target nucleic acids are NOT DETECTED.  The SARS-CoV-2 RNA is generally detectable in upper respiratory specimens during the acute phase of infection. The lowest concentration of SARS-CoV-2 viral copies this assay can detect is 138 copies/mL. A negative result does not preclude SARS-Cov-2 infection and should not be used as the sole basis for treatment or other patient management decisions. A negative result may occur with  improper specimen collection/handling, submission of specimen other than nasopharyngeal swab, presence of viral mutation(s) within the areas targeted by this assay, and inadequate number of viral copies(<138 copies/mL). A negative result must be combined with clinical observations, patient history, and epidemiological information. The expected result is Negative.  Fact Sheet for Patients:  bloggercourse.com  Fact Sheet for Healthcare Providers:  seriousbroker.it  This test is no t yet approved or cleared by the [place]  FDA and  has been authorized for detection and/or diagnosis of SARS-CoV-2 by FDA under an Emergency Use Authorization (EUA). This EUA will remain  in effect (meaning this test can be used) for the duration of the COVID-19 declaration under Section 564(b)(1) of the Act,  21 U.S.C.section 360bbb-3(b)(1), unless the authorization is terminated  or revoked sooner.       Influenza A by PCR POSITIVE (A) NEGATIVE Final   Influenza B by PCR NEGATIVE NEGATIVE Final    Comment: (NOTE) The Xpert Xpress SARS-CoV-2/FLU/RSV plus assay is intended as an aid in the diagnosis of influenza from Nasopharyngeal swab specimens and should not be used as a sole basis for treatment. Nasal washings and aspirates are unacceptable for Xpert Xpress SARS-CoV-2/FLU/RSV testing.  Fact Sheet for Patients: bloggercourse.com  Fact Sheet for Healthcare Providers: seriousbroker.it  This test is not yet approved or cleared by the [place]  FDA and has been authorized for detection and/or diagnosis of SARS-CoV-2 by FDA under an Emergency Use Authorization (EUA). This EUA will remain in effect (meaning this test can be used) for the duration of the COVID-19 declaration under Section 564(b)(1) of the Act, 21 U.S.C. section 360bbb-3(b)(1), unless the authorization is terminated or revoked.     Resp Syncytial Virus by PCR NEGATIVE NEGATIVE Final    Comment: (NOTE) Fact Sheet for Patients: bloggercourse.com  Fact Sheet for Healthcare Providers: seriousbroker.it  This test is not yet approved or cleared by the [place]  FDA and has been authorized for detection and/or diagnosis of SARS-CoV-2 by FDA  under an Emergency Use Authorization (EUA). This EUA will remain in effect (meaning this test can be used) for the duration of the COVID-19 declaration under Section 564(b)(1) of the Act, 21 U.S.C. section 360bbb-3(b)(1), unless the authorization is terminated or revoked.  Performed at [facility], [address] [place], [place] [address]   Urine Culture     Status: Abnormal   Collection Time: 10/20/24  7:36 PM   Specimen: Urine, Random  Result Value Ref Range Status    Specimen Description   Final    URINE, RANDOM Performed at [facility], [address], [place], [place] [address]    Special Requests   Final    NONE Reflexed from [id number] Performed at [facility], [address] [place], [place] [address]    Culture >=100,000 COLONIES/mL ESCHERICHIA COLI (A)  Final   Report Status 10/23/2024 FINAL  Final   Organism ID, Bacteria ESCHERICHIA COLI (A)  Final      Susceptibility   Escherichia coli - MIC*    AMPICILLIN 4 SENSITIVE Sensitive     CEFAZOLIN  (URINE) Value in next row Sensitive      2 SENSITIVEThis is a modified FDA-approved test that has been validated and its performance characteristics determined by the reporting laboratory.  This laboratory is certified under the Clinical Laboratory Improvement Amendments CLIA as qualified to perform high complexity clinical laboratory testing.    CEFEPIME  Value in next row Sensitive      2 SENSITIVEThis is a modified FDA-approved test that has been validated and its performance characteristics determined by the reporting laboratory.  This laboratory is certified under the Clinical Laboratory Improvement Amendments CLIA as qualified to perform high complexity clinical laboratory testing.    ERTAPENEM Value in next row Sensitive      2 SENSITIVEThis is a modified FDA-approved test that has been validated and its performance characteristics determined by the reporting laboratory.  This laboratory is certified under the Clinical Laboratory Improvement Amendments CLIA as qualified to perform high complexity clinical laboratory testing.    CEFTRIAXONE  Value in next row Sensitive      2 SENSITIVEThis is a modified FDA-approved test that has been validated and its performance characteristics determined by the reporting laboratory.  This laboratory is certified under the Clinical Laboratory Improvement Amendments CLIA as qualified to perform high complexity clinical laboratory testing.    CIPROFLOXACIN  Value in next row  Sensitive      2 SENSITIVEThis is a modified FDA-approved test that has been validated and its performance characteristics determined by the reporting laboratory.  This laboratory is certified under the Clinical Laboratory Improvement Amendments CLIA as qualified to perform high complexity clinical laboratory testing.    GENTAMICIN Value in next row Sensitive      2 SENSITIVEThis is a modified FDA-approved test that has been validated and its performance characteristics determined by the reporting laboratory.  This laboratory is certified under the Clinical Laboratory Improvement Amendments CLIA as qualified to perform high complexity clinical laboratory testing.    NITROFURANTOIN Value in next row Sensitive      2 SENSITIVEThis is a modified FDA-approved test that has been validated and its performance characteristics determined by the reporting laboratory.  This laboratory is certified under the Clinical Laboratory Improvement Amendments CLIA as qualified to perform high complexity clinical laboratory testing.    TRIMETH /SULFA  Value in next row Sensitive      2 SENSITIVEThis is a modified FDA-approved test that has been validated and its performance characteristics determined by  the reporting laboratory.  This laboratory is certified under the Clinical Laboratory Improvement Amendments CLIA as qualified to perform high complexity clinical laboratory testing.    AMPICILLIN/SULBACTAM Value in next row Sensitive      2 SENSITIVEThis is a modified FDA-approved test that has been validated and its performance characteristics determined by the reporting laboratory.  This laboratory is certified under the Clinical Laboratory Improvement Amendments CLIA as qualified to perform high complexity clinical laboratory testing.    PIP/TAZO Value in next row Sensitive      <=4 SENSITIVEThis is a modified FDA-approved test that has been validated and its performance characteristics determined by the reporting  laboratory.  This laboratory is certified under the Clinical Laboratory Improvement Amendments CLIA as qualified to perform high complexity clinical laboratory testing.    MEROPENEM Value in next row Sensitive      <=4 SENSITIVEThis is a modified FDA-approved test that has been validated and its performance characteristics determined by the reporting laboratory.  This laboratory is certified under the Clinical Laboratory Improvement Amendments CLIA as qualified to perform high complexity clinical laboratory testing.    * >=100,000 COLONIES/mL ESCHERICHIA COLI  MRSA Next Gen by PCR, Nasal     Status: None   Collection Time: 10/21/24  1:20 AM   Specimen: Nasal Mucosa; Nasal Swab  Result Value Ref Range Status   MRSA by PCR Next Gen NOT DETECTED NOT DETECTED Final    Comment: (NOTE) The GeneXpert MRSA Assay (FDA approved for NASAL specimens only), is one component of a comprehensive MRSA colonization surveillance program. It is not intended to diagnose MRSA infection nor to guide or monitor treatment for MRSA infections. Test performance is not FDA approved in patients less than 10 years old. Performed at [facility], [address] [place], [place] [address]   Culture, blood (routine x 2) Call MD if unable to obtain prior to antibiotics being given     Status: None   Collection Time: 10/21/24  3:16 AM   Specimen: BLOOD  Result Value Ref Range Status   Specimen Description BLOOD BLOOD LEFT ARM  Final   Special Requests   Final    BOTTLES DRAWN AEROBIC AND ANAEROBIC Blood Culture adequate volume   Culture   Final    NO GROWTH 5 DAYS Performed at [facility], [address] [place], [place] [address]    Report Status 10/26/2024 FINAL  Final  Culture, blood (routine x 2) Call MD if unable to obtain prior to antibiotics being given     Status: None   Collection Time: 10/21/24  3:17 AM   Specimen: BLOOD  Result Value Ref Range Status   Specimen Description BLOOD BLOOD LEFT HAND  Final    Special Requests AEROBIC BOTTLE ONLY Blood Culture adequate volume  Final   Culture   Final    NO GROWTH 5 DAYS Performed at [facility], [address], [place], [place] [address]    Report Status 10/26/2024 FINAL  Final     Labs: BNP (last 3 results) No results for input(s): BNP in the last 8760 hours. Basic Metabolic Panel: Recent Labs  Lab 10/20/24 1936 10/21/24 0508 10/22/24 0457 10/22/24 2150 10/23/24 0616 10/24/24 0438 10/24/24 1758 10/26/24 0555  NA 154* 155*   < > 144 145 149* 143 139  K 4.5 4.1   < > 3.7 3.3* 4.2 4.3 4.1  CL 116* 119*   < > 109 109 114* 110 104  CO2 15* 21*   < >  23 30 30  20* 27  GLUCOSE 149* 121*   < > 412* 86 75 211* 202*  BUN 112* 105*   < > 46* 35* 28* 24* 18  CREATININE 4.13* 3.40*   < > 1.84* 1.51* 1.56* 1.39* 1.17*  CALCIUM  8.4* 8.1*   < > 8.3* 8.3* 8.5* 8.5* 8.4*  MG 3.4* 2.9*  --   --   --   --   --  1.9  PHOS  --  4.5  --   --   --   --   --  2.5   < > = values in this interval not displayed.   Liver Function Tests: Recent Labs  Lab 10/20/24 1936 10/21/24 0508  AST 26 24  ALT 14 15  ALKPHOS 90 85  BILITOT 0.5 0.3  PROT 6.8 5.9*  ALBUMIN  3.2* 3.1*   No results for input(s): LIPASE, AMYLASE in the last 168 hours. No results for input(s): AMMONIA in the last 168 hours. CBC: Recent Labs  Lab 10/20/24 1936 10/21/24 0508 10/23/24 0616 10/26/24 0555  WBC 10.1 9.2 6.1 8.1  NEUTROABS 8.6*  --   --   --   HGB 15.5* 12.3 12.2 11.5*  HCT 51.9* 40.2 39.3 35.9*  MCV 88.6 85.5 85.1 83.5  PLT 147* 166 174 227   Cardiac Enzymes: No results for input(s): CKTOTAL, CKMB, CKMBINDEX, TROPONINI in the last 168 hours. BNP: Invalid input(s): POCBNP CBG: Recent Labs  Lab 10/25/24 1146 10/25/24 1620 10/25/24 1958 10/26/24 0708 10/26/24 1108  GLUCAP 331* 202* 232* 208* 337*   D-Dimer No results for input(s): DDIMER in the last 72 hours. Hgb A1c No results for input(s): HGBA1C in the last 72 hours. Lipid  Profile No results for input(s): CHOL, HDL, LDLCALC, TRIG, CHOLHDL, LDLDIRECT in the last 72 hours. Thyroid  function studies No results for input(s): TSH, T4TOTAL, T3FREE, THYROIDAB in the last 72 hours.  Invalid input(s): FREET3 Anemia work up No results for input(s): VITAMINB12, FOLATE, FERRITIN, TIBC, IRON, RETICCTPCT in the last 72 hours. Urinalysis    Component Value Date/Time   COLORURINE AMBER (A) 10/20/2024 1936   APPEARANCEUR CLOUDY (A) 10/20/2024 1936   APPEARANCEUR Cloudy (A) 08/20/2023 0839   LABSPEC 1.014 10/20/2024 1936   PHURINE 5.0 10/20/2024 1936   GLUCOSEU NEGATIVE 10/20/2024 1936   HGBUR SMALL (A) 10/20/2024 1936   BILIRUBINUR NEGATIVE 10/20/2024 1936   BILIRUBINUR Negative 08/20/2023 0839   KETONESUR NEGATIVE 10/20/2024 1936   PROTEINUR 100 (A) 10/20/2024 1936   UROBILINOGEN negative 01/11/2015 1442   UROBILINOGEN 0.2 12/31/2014 0409   NITRITE NEGATIVE 10/20/2024 1936   LEUKOCYTESUR LARGE (A) 10/20/2024 1936   Sepsis Labs Recent Labs  Lab 10/20/24 1936 10/21/24 0508 10/23/24 0616 10/26/24 0555  WBC 10.1 9.2 6.1 8.1   Microbiology Recent Results (from the past 240 hours)  Resp panel by RT-PCR (RSV, Flu A&B, Covid) Anterior Nasal Swab     Status: Abnormal   Collection Time: 10/20/24  7:36 PM   Specimen: Anterior Nasal Swab  Result Value Ref Range Status   SARS Coronavirus 2 by RT PCR NEGATIVE NEGATIVE Final    Comment: (NOTE) SARS-CoV-2 target nucleic acids are NOT DETECTED.  The SARS-CoV-2 RNA is generally detectable in upper respiratory specimens during the acute phase of infection. The lowest concentration of SARS-CoV-2 viral copies this assay can detect is 138 copies/mL. A negative result does not preclude SARS-Cov-2 infection and should not be used as the sole basis for treatment or other patient management decisions.  A negative result may occur with  improper specimen collection/handling, submission of  specimen other than nasopharyngeal swab, presence of viral mutation(s) within the areas targeted by this assay, and inadequate number of viral copies(<138 copies/mL). A negative result must be combined with clinical observations, patient history, and epidemiological information. The expected result is Negative.  Fact Sheet for Patients:  bloggercourse.com  Fact Sheet for Healthcare Providers:  seriousbroker.it  This test is no t yet approved or cleared by the [place]  FDA and  has been authorized for detection and/or diagnosis of SARS-CoV-2 by FDA under an Emergency Use Authorization (EUA). This EUA will remain  in effect (meaning this test can be used) for the duration of the COVID-19 declaration under Section 564(b)(1) of the Act, 21 U.S.C.section 360bbb-3(b)(1), unless the authorization is terminated  or revoked sooner.       Influenza A by PCR POSITIVE (A) NEGATIVE Final   Influenza B by PCR NEGATIVE NEGATIVE Final    Comment: (NOTE) The Xpert Xpress SARS-CoV-2/FLU/RSV plus assay is intended as an aid in the diagnosis of influenza from Nasopharyngeal swab specimens and should not be used as a sole basis for treatment. Nasal washings and aspirates are unacceptable for Xpert Xpress SARS-CoV-2/FLU/RSV testing.  Fact Sheet for Patients: bloggercourse.com  Fact Sheet for Healthcare Providers: seriousbroker.it  This test is not yet approved or cleared by the [place]  FDA and has been authorized for detection and/or diagnosis of SARS-CoV-2 by FDA under an Emergency Use Authorization (EUA). This EUA will remain in effect (meaning this test can be used) for the duration of the COVID-19 declaration under Section 564(b)(1) of the Act, 21 U.S.C. section 360bbb-3(b)(1), unless the authorization is terminated or revoked.     Resp Syncytial Virus by PCR NEGATIVE NEGATIVE  Final    Comment: (NOTE) Fact Sheet for Patients: bloggercourse.com  Fact Sheet for Healthcare Providers: seriousbroker.it  This test is not yet approved or cleared by the [place]  FDA and has been authorized for detection and/or diagnosis of SARS-CoV-2 by FDA under an Emergency Use Authorization (EUA). This EUA will remain in effect (meaning this test can be used) for the duration of the COVID-19 declaration under Section 564(b)(1) of the Act, 21 U.S.C. section 360bbb-3(b)(1), unless the authorization is terminated or revoked.  Performed at [facility], [address], [place], [place] [address]   Urine Culture     Status: Abnormal   Collection Time: 10/20/24  7:36 PM   Specimen: Urine, Random  Result Value Ref Range Status   Specimen Description   Final    URINE, RANDOM Performed at [facility], [address] [place], [place] [address]    Special Requests   Final    NONE Reflexed from [id number] Performed at [facility], [address], [place], [place] [address]    Culture >=100,000 COLONIES/mL ESCHERICHIA COLI (A)  Final   Report Status 10/23/2024 FINAL  Final   Organism ID, Bacteria ESCHERICHIA COLI (A)  Final      Susceptibility   Escherichia coli - MIC*    AMPICILLIN 4 SENSITIVE Sensitive     CEFAZOLIN  (URINE) Value in next row Sensitive      2 SENSITIVEThis is a modified FDA-approved test that has been validated and its performance characteristics determined by the reporting laboratory.  This laboratory is certified under the Clinical Laboratory Improvement Amendments CLIA as qualified to perform high complexity clinical laboratory testing.    CEFEPIME  Value in next row Sensitive  2 SENSITIVEThis is a modified FDA-approved test that has been validated and its performance characteristics determined by the reporting laboratory.  This laboratory is certified under the Clinical Laboratory Improvement Amendments CLIA as  qualified to perform high complexity clinical laboratory testing.    ERTAPENEM Value in next row Sensitive      2 SENSITIVEThis is a modified FDA-approved test that has been validated and its performance characteristics determined by the reporting laboratory.  This laboratory is certified under the Clinical Laboratory Improvement Amendments CLIA as qualified to perform high complexity clinical laboratory testing.    CEFTRIAXONE  Value in next row Sensitive      2 SENSITIVEThis is a modified FDA-approved test that has been validated and its performance characteristics determined by the reporting laboratory.  This laboratory is certified under the Clinical Laboratory Improvement Amendments CLIA as qualified to perform high complexity clinical laboratory testing.    CIPROFLOXACIN  Value in next row Sensitive      2 SENSITIVEThis is a modified FDA-approved test that has been validated and its performance characteristics determined by the reporting laboratory.  This laboratory is certified under the Clinical Laboratory Improvement Amendments CLIA as qualified to perform high complexity clinical laboratory testing.    GENTAMICIN Value in next row Sensitive      2 SENSITIVEThis is a modified FDA-approved test that has been validated and its performance characteristics determined by the reporting laboratory.  This laboratory is certified under the Clinical Laboratory Improvement Amendments CLIA as qualified to perform high complexity clinical laboratory testing.    NITROFURANTOIN Value in next row Sensitive      2 SENSITIVEThis is a modified FDA-approved test that has been validated and its performance characteristics determined by the reporting laboratory.  This laboratory is certified under the Clinical Laboratory Improvement Amendments CLIA as qualified to perform high complexity clinical laboratory testing.    TRIMETH /SULFA  Value in next row Sensitive      2 SENSITIVEThis is a modified FDA-approved test that  has been validated and its performance characteristics determined by the reporting laboratory.  This laboratory is certified under the Clinical Laboratory Improvement Amendments CLIA as qualified to perform high complexity clinical laboratory testing.    AMPICILLIN/SULBACTAM Value in next row Sensitive      2 SENSITIVEThis is a modified FDA-approved test that has been validated and its performance characteristics determined by the reporting laboratory.  This laboratory is certified under the Clinical Laboratory Improvement Amendments CLIA as qualified to perform high complexity clinical laboratory testing.    PIP/TAZO Value in next row Sensitive      <=4 SENSITIVEThis is a modified FDA-approved test that has been validated and its performance characteristics determined by the reporting laboratory.  This laboratory is certified under the Clinical Laboratory Improvement Amendments CLIA as qualified to perform high complexity clinical laboratory testing.    MEROPENEM Value in next row Sensitive      <=4 SENSITIVEThis is a modified FDA-approved test that has been validated and its performance characteristics determined by the reporting laboratory.  This laboratory is certified under the Clinical Laboratory Improvement Amendments CLIA as qualified to perform high complexity clinical laboratory testing.    * >=100,000 COLONIES/mL ESCHERICHIA COLI  MRSA Next Gen by PCR, Nasal     Status: None   Collection Time: 10/21/24  1:20 AM   Specimen: Nasal Mucosa; Nasal Swab  Result Value Ref Range Status   MRSA by PCR Next Gen NOT DETECTED NOT DETECTED Final    Comment: (NOTE) The GeneXpert MRSA  Assay (FDA approved for NASAL specimens only), is one component of a comprehensive MRSA colonization surveillance program. It is not intended to diagnose MRSA infection nor to guide or monitor treatment for MRSA infections. Test performance is not FDA approved in patients less than 38 years old. Performed at [facility], [address], [place], [place] [address]   Culture, blood (routine x 2) Call MD if unable to obtain prior to antibiotics being given     Status: None   Collection Time: 10/21/24  3:16 AM   Specimen: BLOOD  Result Value Ref Range Status   Specimen Description BLOOD BLOOD LEFT ARM  Final   Special Requests   Final    BOTTLES DRAWN AEROBIC AND ANAEROBIC Blood Culture adequate volume   Culture   Final    NO GROWTH 5 DAYS Performed at [facility], [address] [place], [place] [address]    Report Status 10/26/2024 FINAL  Final  Culture, blood (routine x 2) Call MD if unable to obtain prior to antibiotics being given     Status: None   Collection Time: 10/21/24  3:17 AM   Specimen: BLOOD  Result Value Ref Range Status   Specimen Description BLOOD BLOOD LEFT HAND  Final   Special Requests AEROBIC BOTTLE ONLY Blood Culture adequate volume  Final   Culture   Final    NO GROWTH 5 DAYS Performed at [facility], [address] [place], [place] [address]    Report Status 10/26/2024 FINAL  Final     Time coordinating discharge:  I have spent 35 minutes face to face with the patient and on the ward discussing the patients care, assessment, plan and disposition with other care givers. >50% of the time was devoted counseling the patient about the risks and benefits of treatment/Discharge disposition and coordinating care.   SIGNED:   Burgess JAYSON Dare, MD  [facility] 10/26/2024, 3:03 PM   If 7PM-7AM, please contact night-coverage      [1]  Allergies Allergen Reactions   Lipitor [Atorvastatin ] Other (See Comments)    Constipation    Soma [Carisoprodol] Nausea And Vomiting

## 2024-10-27 LAB — GLUCOSE, CAPILLARY
Glucose-Capillary: 142 mg/dL — ABNORMAL HIGH (ref 70–99)
Glucose-Capillary: 244 mg/dL — ABNORMAL HIGH (ref 70–99)
Glucose-Capillary: 192 mg/dL — ABNORMAL HIGH (ref 70–99)
Glucose-Capillary: 133 mg/dL — ABNORMAL HIGH (ref 70–99)

## 2024-10-27 NOTE — Progress Notes (Signed)
 [facility] PROGRESS NOTE  Jamie [name] (DOB: 03-14-1951) FMW:[id number] PCP: Lavell Bari LABOR, FNP  Brief Narrative: Jamie [name] is a 74 y.o. female with a history of dementia, HTN, HLD, T2DM, COPD who presented to the ED on 10/20/2024 with weakness and worsening confusion, found to have multifocal pneumonia and influenza A infection complicated by hypernatremia, AKI, and UTI in a larger context of failure to thrive. Palliative care has been consulted, defining goals of care with the family. Continued medical therapies are given with clinical improvement. Goal of care for acute conditions is curative, the patient remains full code, and her deconditioning will be addressed with short term rehabilitation at SNF. She is stable for discharge, though will not have a bed available until 1/15.   Subjective: Pt confused, only says your hands are cold! Shakes head no to questions of pain or anything else bothering her.   Objective: BP 138/71 (BP Location: Right Arm)   Pulse 85   Temp 97.7 F (36.5 C) (Oral)   Resp 18   Ht 5' (1.524 m)   Wt 35.5 kg   SpO2 93%   BMI 15.28 kg/m   Gen: Elderly, frail female in no distress Pulm: Clear, nonlabored   Neuro: Alert and not oriented. Ext: Warm, dry, sarcopenic.   Assessment & Plan: Multifocal pneumonia, influenza A infection: Completed tamiflu  x5 days, Will complete 7 days abx today.   E. coli UTI: Completing abx to which this isolate is susceptible.   Hypernatremia, AKI: Improved. Recheck in next week at SNF is recommended.   COPD: No wheezing.   Dementia: Continue home medications. Within the constraints of inpatient evaluation, she appears to be at reported baseline.   Deconditioning: Will need rehabilitation to return to PLOF, which is within the family's goals of care for her. Awaiting SNF bed availability (reportedly will be 1/15).    Bernardino KATHEE Come, MD [facility] [email or web address] 10/27/2024, 8:25 AM

## 2024-10-27 NOTE — Progress Notes (Signed)
 Palliative: Chart review completed.  Face-to-face discussion with transition of care team.  At this point patient and family have elected short-term rehab.  Jamie [name] has been accepted to [facility] and they will have a bed available 1/15.  No needs at this time.  PMT to follow.  Plan: Short-term rehab at [facility], bed available 1/15.  No charge Lorenza Birkenhead, NP Palliative medicine team Team phone [phone]

## 2024-10-28 LAB — GLUCOSE, CAPILLARY
Glucose-Capillary: 238 mg/dL — ABNORMAL HIGH (ref 70–99)
Glucose-Capillary: 285 mg/dL — ABNORMAL HIGH (ref 70–99)
Glucose-Capillary: 64 mg/dL — ABNORMAL LOW (ref 70–99)
Glucose-Capillary: 144 mg/dL — ABNORMAL HIGH (ref 70–99)
Glucose-Capillary: 239 mg/dL — ABNORMAL HIGH (ref 70–99)

## 2024-10-28 NOTE — Plan of Care (Signed)

## 2024-10-28 NOTE — Progress Notes (Signed)
" °   10/28/24 1721  What Happened  Was fall witnessed? No  Was patient injured? No  Patient found on floor  Found by Staff-comment  Stated prior activity bathroom-unassisted  Provider Notification  Provider Name/Title Juvenal Raisin, DO  Date Provider Notified 10/28/24  Time Provider Notified 1740  Method of Notification Page (secure chat)  Notification Reason Fall  Provider response No new orders  Date of Provider Response 10/28/24  Follow Up  Family notified Yes - comment  Time family notified 1745  Adult Fall Risk Assessment  Risk Factor Category (scoring not indicated) High fall risk per protocol (document High fall risk)  Patient Fall Risk Level High fall risk  Adult Fall Risk Interventions  Required Bundle Interventions *See Row Information* High fall risk  Additional Interventions Use of appropriate toileting equipment (bedpan, BSC, etc.)  Fall intervention(s) refused/Patient educated regarding refusal Bed alarm;Nonskid socks;Yellow bracelet  Screening for Fall Injury Risk (To be completed on HIGH fall risk patients) - Assessing Need for Floor Mats  Risk For Fall Injury- Criteria for Floor Mats Confusion/dementia (+NuDESC, CIWA, TBI, etc.)  Will Implement Floor Mats Yes  Vitals  Temp 98.3 F (36.8 C)  Temp Source Oral  BP 129/71  MAP (mmHg) 89  BP Method Automatic  Pulse Rate 83  Pulse Rate Source Monitor  Resp 16  Oxygen Therapy  SpO2 96 %  O2 Device Room Air  Pain Assessment  Pain Scale 0-10  Pain Score 0  Neurological  Neuro (WDL) X  Level of Consciousness Alert  Orientation Level Oriented to person;Disoriented to place;Disoriented to time;Disoriented to situation  Cognition Poor judgement;Poor safety awareness  Speech Clear  R Pupil Size (mm) 3  R Pupil Shape Round  R Pupil Reaction Brisk  L Pupil Size (mm) 3  L Pupil Shape Round  L Pupil Reaction Brisk  Neuro Symptoms Forgetful  Musculoskeletal  Musculoskeletal (WDL) X  Assistive Device BSC   Generalized Weakness Yes  Integumentary  Integumentary (WDL) X  Skin Color Appropriate for ethnicity  Skin Integrity Intact    "

## 2024-10-28 NOTE — Discharge Summary (Signed)
 Physician Discharge Summary  Jamie [name] FMW:[id number] DOB: 17-Oct-1950 DOA: 10/20/2024  PCP: Lavell Bari LABOR, FNP  Admit date: 10/20/2024 Discharge date: 10/28/2024  Admitted From: Home Disposition: SNF  Recommendations for Outpatient Follow-up:  Follow up with PCP in 1-2 weeks Please obtain BMP/CBC in 3-5 days Recommend outpatient follow-up with palliative care services    Discharge Condition: Stable CODE STATUS: Full code Diet recommendation: Diet cardiac  Brief/Interim Summary: Brief Narrative:   74 y.o. female with medical history significant of hypertension, advanced dementia, hyperlipidemia, T2DM, COPD, dementia who presents to the emergency department from home via EMS due to generalized weakness and confusion.  Upon admission patient was found to have multifocal community-acquired pneumonia secondary to influenza A infection complicated by dehydration, hypernatremia, AKI, UTI and failure to thrive. Eventually she started slowly improving.  Due to her advanced dementia and comorbidities, had palliative care discussion and patient and family opted for to continue medical treatment.  PT OT recommended SNF  Assessment & Plan:  Multifocal bacterial community-acquired pneumonia, POA Influenza A  Improving.  finished. Augmentin .  Completed 5 days of Tamiflu .  Continue home bronchodilators.   Hypernatremia,POA Acute kidney injury with metabolic acidosis Adult failure to thrive with severe malnutrition - Advised oral hydration at home.  Recommended to recheck BMP outpatient in next 5 to 7 days.  Antihypertensive losartan  may need to be adjusted depending on renal function and her oral intake -Unfortunately due to advanced dementia her p.o. intake remains poor.  Encourage p.o. nutrition - Palliative care team to continue following outpatient   UTI: Treated with antibiotics    Type 2 diabetes mellitus with hyperglycemia, POA:  A1c on 07/13/2024 was 7.7%.   Sliding scale and  Accu-Chek and long-acting.  Adjust as necessary     COPD, POA:  - no current exacerbation.  Continue with albuterol  and Breztri . - hasn't had any albuterol  b/c not aware enough to ask for it.  Lungs sound good, no evidence of exacerbation currently.    Alzheimer's dementia, POA: Continue with Aricept  and Namenda  - advanced, complicating care   Hyperlipidemia, POA:  Continue with statin   Hypertension, POA:  Continue with amlodipine .  Holding losartan  in the setting of acute kidney injury. IV as needed  Seen by palliative care service   DVT prophylaxis: Subcu heparin     Code Status: Full Code Family Communication:   Status is: Inpatient Remains inpatient appropriate because: Discharge    PT Follow up Recs: Skilled Nursing-Short Term Rehab (<3 Hours/Day)10/24/2024 1116      Discharge Diagnoses:  Principal Problem:   Multifocal pneumonia Active Problems:   Protein-calorie malnutrition, severe      Discharge Exam: Vitals:   10/28/24 0500 10/28/24 0823  BP:    Pulse: 79   Resp: 18   Temp: 98.2 F (36.8 C)   SpO2:  98%   Vitals:   10/27/24 1933 10/27/24 1935 10/28/24 0500 10/28/24 0823  BP: 114/65     Pulse: 83  79   Resp: 20  18   Temp: 98.1 F (36.7 C)  98.2 F (36.8 C)   TempSrc: Oral  Oral   SpO2: 93% 93%  98%  Weight:      Height:          Discharge Instructions   Allergies as of 10/28/2024       Reactions   Lipitor [atorvastatin ] Other (See Comments)   Constipation    Soma [carisoprodol] Nausea And Vomiting        Medication  List     STOP taking these medications    risperiDONE  0.5 MG tablet Commonly known as: RISPERDAL        TAKE these medications    albuterol  (2.5 MG/3ML) 0.083% nebulizer solution Commonly known as: PROVENTIL  Take 3 mLs (2.5 mg total) by nebulization every 6 (six) hours as needed for wheezing or shortness of breath.   amLODipine  10 MG tablet Commonly known as: NORVASC  Take 1 tablet (10 mg total) by  mouth daily.   aspirin  EC 81 MG tablet Take 1 tablet (81 mg total) by mouth daily with breakfast. Swallow whole.   donepezil  10 MG tablet Commonly known as: ARICEPT  Take 1 tablet (10 mg total) by mouth at bedtime.   Glucose Instant Energy 4-6 GM-MG Chew chewable tablet Generic drug: glucose-Vitamin C Chew 1 tablet by mouth as needed for low blood sugar.   Lantus  SoloStar 100 UNIT/ML Solostar Pen Generic drug: insulin  glargine Inject 18 Units into the skin at bedtime.   losartan  100 MG tablet Commonly known as: COZAAR  Take 1 tablet (100 mg total) by mouth daily.   memantine  10 MG tablet Commonly known as: NAMENDA  Take 1 tablet (10 mg total) by mouth 2 (two) times daily.   pravastatin  40 MG tablet Commonly known as: PRAVACHOL  Take 1 tablet (40 mg total) by mouth daily.   Trelegy Ellipta  100-62.5-25 MCG/ACT Aepb Generic drug: Fluticasone -Umeclidin-Vilant One click each am        Contact information for after-discharge care     Destination     [facility] .   Service: Skilled Nursing Contact information: [address] [place]  [address] [phone]             Home Medical Care     [facility]) .   Service: Home Health Services Contact information: [address] [place]  [address] [phone]                    Allergies[1]  You were cared for by a hospitalist during your hospital stay. If you have any questions about your discharge medications or the care you received while you were in the hospital after you are discharged, you can call the unit and asked to speak with the hospitalist on call if the hospitalist that took care of you is not available. Once you are discharged, your primary care physician will handle any further medical issues. Please note that no refills for any discharge medications will be authorized once you  are discharged, as it is imperative that you return to your primary care physician (or establish a relationship with a primary care physician if you do not have one) for your aftercare needs so that they can reassess your need for medications and monitor your lab values.  You were cared for by a hospitalist during your hospital stay. If you have any questions about your discharge medications or the care you received while you were in the hospital after you are discharged, you can call the unit and asked to speak with the hospitalist on call if the hospitalist that took care of you is not available. Once you are discharged, your primary care physician will handle any further medical issues. Please note that NO REFILLS for any discharge medications will be authorized once you are discharged, as it is imperative that you return to your primary care physician (or establish a relationship with a primary care physician if  you do not have one) for your aftercare needs so that they can reassess your need for medications and monitor your lab values.  Please request your Prim.MD to go over all Hospital Tests and Procedure/Radiological results at the follow up, please get all Hospital records sent to your Prim MD by signing hospital release before you go home.  Get CBC, CMP, 2 view Chest X ray checked  by Primary MD during your next visit or SNF MD in 5-7 days ( we routinely change or add medications that can affect your baseline labs and fluid status, therefore we recommend that you get the mentioned basic workup next visit with your PCP, your PCP may decide not to get them or add new tests based on their clinical decision)  On your next visit with your primary care physician please Get Medicines reviewed and adjusted.  If you experience worsening of your admission symptoms, develop shortness of breath, life threatening emergency, suicidal or homicidal thoughts you must seek medical attention immediately by calling  [phone] or calling your MD immediately  if symptoms less severe.  You Must read complete instructions/literature along with all the possible adverse reactions/side effects for all the Medicines you take and that have been prescribed to you. Take any new Medicines after you have completely understood and accpet all the possible adverse reactions/side effects.   Do not drive, operate heavy machinery, perform activities at heights, swimming or participation in water activities or provide baby sitting services if your were admitted for syncope or siezures until you have seen by Primary MD or a Neurologist and advised to do so again.  Do not drive when taking Pain medications.   Procedures/Studies: CT Cervical Spine Wo Contrast Result Date: 10/20/2024 EXAM: CT CERVICAL SPINE WITHOUT CONTRAST 10/20/2024 08:45:38 PM TECHNIQUE: CT of the cervical spine was performed without the administration of intravenous contrast. Multiplanar reformatted images are provided for review. Automated exposure control, iterative reconstruction, and/or weight based adjustment of the mA/kV was utilized to reduce the radiation dose to as low as reasonably achievable. COMPARISON: 11/23/2023 CLINICAL HISTORY: Neck trauma (Age >= 65y) FINDINGS: BONES AND ALIGNMENT: No acute fracture or traumatic malalignment. DEGENERATIVE CHANGES: Prior ACDF at C6-C7. Diffuse degenerative disc and facet disease. SOFT TISSUES: No prevertebral soft tissue swelling. IMPRESSION: 1. No acute findings. 2. Postoperative and degenerative changes. Electronically signed by: Franky Crease MD MD 10/20/2024 09:08 PM EST RP Workstation: [id number]   CT Head Wo Contrast Result Date: 10/20/2024 EXAM: CT HEAD WITHOUT CONTRAST 10/20/2024 08:45:38 PM TECHNIQUE: CT of the head was performed without the administration of intravenous contrast. Automated exposure control, iterative reconstruction, and/or weight based adjustment of the mA/kV was utilized to reduce the radiation dose  to as low as reasonably achievable. COMPARISON: 11/23/2023 CLINICAL HISTORY: Mental status change, unknown cause. FINDINGS: BRAIN AND VENTRICLES: No acute hemorrhage. No evidence of acute infarct. No hydrocephalus. No extra-axial collection. No mass effect or midline shift. Atrophy and chronic small vessel disease throughout the deep white matter. ORBITS: No acute abnormality. SINUSES: No acute abnormality. SOFT TISSUES AND SKULL: No acute soft tissue abnormality. No skull fracture. IMPRESSION: 1. No acute intracranial abnormality. 2. Atrophy, chronic small vessel disease. Electronically signed by: Franky Crease MD MD 10/20/2024 09:06 PM EST RP Workstation: [id number]   CT CHEST ABDOMEN PELVIS WO CONTRAST Result Date: 10/20/2024 EXAM: CT CHEST, ABDOMEN AND PELVIS WITHOUT CONTRAST 10/20/2024 08:45:38 PM TECHNIQUE: CT of the chest, abdomen and pelvis was performed without the administration of intravenous contrast. Multiplanar reformatted images  are provided for review. Automated exposure control, iterative reconstruction, and/or weight based adjustment of the mA/kV was utilized to reduce the radiation dose to as low as reasonably achievable. COMPARISON: Chest radiograph 10/20/2024, CT abdomen and pelvis 12/31/2014, CT chest 08/25/2013, and CT thoracolumbar spine 11/23/2023. CLINICAL HISTORY: Polytrauma, blunt. Weakness and altered mental status. FINDINGS: CHEST: MEDIASTINUM AND LYMPH NODES: Heart and pericardium are unremarkable. Normal heart size. No pericardial effusion. The central airways are clear. No mediastinal, hilar or axillary lymphadenopathy. The thyroid  gland is unremarkable. The esophagus is decompressed. Calcification of the aorta and coronary arteries. No aortic aneurysm. LUNGS AND PLEURA: Emphysematous changes diffusely throughout the lungs. Bronchiectasis with bronchial wall thickening. Multiple nodular peribronchial infiltrates bilaterally. These changes are likely to represent chronic bronchitis  with acute multifocal bronchopneumonia versus aspiration. No pleural effusion or pneumothorax. ABDOMEN AND PELVIS: LIMITATIONS/ARTIFACTS: Streak artifact from arms and hardware-limited evaluation of the abdomen and pelvis. LIVER: As visualized, the liver is unremarkable. GALLBLADDER AND BILE DUCTS: As visualized, the gallbladder is unremarkable. No biliary ductal dilatation. SPLEEN: As visualized, the spleen is unremarkable. PANCREAS: As visualized, the pancreas is unremarkable. ADRENAL GLANDS: As visualized, the adrenal glands are unremarkable. KIDNEYS, URETERS AND BLADDER: As visualized, the kidneys and ureters are unremarkable. No stones in the kidneys or ureters. No hydronephrosis. No perinephric or periureteral stranding. Gas in the bladder. This could arise from catheterization if performed recently or from infection. Correlate with urinalysis. GI AND BOWEL: Stomach, small bowel, and the colon are mostly decompressed. There is no bowel obstruction. REPRODUCTIVE ORGANS: As visualized, the uterus and ovaries are unremarkable. PERITONEUM AND RETROPERITONEUM: No ascites. No free air. VASCULATURE: Aorta is normal in caliber. Dense calcification of the abdominal aorta. No aneurysm. ABDOMINAL AND PELVIS LYMPH NODES: No lymphadenopathy. BONES AND SOFT TISSUES: Postoperative posterior rod and screw fixation from T12 through L4. Anterior fixation of the cervical spine. Degenerative changes throughout the lumbar spine and cervical spine with narrowed interspaces and endplate osteophyte formation. Visualized surgical hardware appears intact. No acute bony abnormalities. Infiltration in the anterior abdominal wall subcutaneous fat consistent with injection sites. Circumscribed cystic lesion in the left pelvic subcutaneous fat measuring 2.2 cm in diameter, likely a sebaceous cyst. Calcified phleboliths in the pelvis. IMPRESSION: 1. No evidence of acute traumatic injury. 2. Multifocal bronchopneumonia versus aspiration.  Chronic bronchitic changes with bronchiectasis. 3. Gas in the urinary bladder, which may be related to recent catheterization or infection; recommend urinalysis. Electronically signed by: Elsie Gravely MD 10/20/2024 08:56 PM EST RP Workstation: [id number]   DG Chest Port 1 View Result Date: 10/20/2024 EXAM: 1 VIEW(S) XRAY OF THE CHEST 10/20/2024 07:52:00 PM COMPARISON: CT of the chest 08/25/2013. CLINICAL HISTORY: Questionable sepsis - evaluate for abnormality. FINDINGS: LUNGS AND PLEURA: Lungs are hyperinflated, likely related to emphysema. A small nodular density projects over the left lower lung measuring 5 mm which is indeterminate. No pleural effusion. No pneumothorax. HEART AND MEDIASTINUM: Atherosclerotic calcifications of the aorta. No acute abnormality of the cardiac and mediastinal silhouettes. BONES AND SOFT TISSUES: Thoracic and cervical spinal fusion hardware present. No acute osseous abnormality. IMPRESSION: 1. No acute findings. 2. Hyperinflated lungs, likely related to emphysema. 3. Small indeterminate 5 mm nodular density projecting over the left lower lung. Follow-up chest CT recommended. Electronically signed by: Greig Pique MD MD 10/20/2024 07:59 PM EST RP Workstation: [id number]     The results of significant diagnostics from this hospitalization (including imaging, microbiology, ancillary and laboratory) are listed below for reference.     Microbiology: Recent  Results (from the past 240 hours)  Resp panel by RT-PCR (RSV, Flu A&B, Covid) Anterior Nasal Swab     Status: Abnormal   Collection Time: 10/20/24  7:36 PM   Specimen: Anterior Nasal Swab  Result Value Ref Range Status   SARS Coronavirus 2 by RT PCR NEGATIVE NEGATIVE Final    Comment: (NOTE) SARS-CoV-2 target nucleic acids are NOT DETECTED.  The SARS-CoV-2 RNA is generally detectable in upper respiratory specimens during the acute phase of infection. The lowest concentration of SARS-CoV-2 viral copies this assay  can detect is 138 copies/mL. A negative result does not preclude SARS-Cov-2 infection and should not be used as the sole basis for treatment or other patient management decisions. A negative result may occur with  improper specimen collection/handling, submission of specimen other than nasopharyngeal swab, presence of viral mutation(s) within the areas targeted by this assay, and inadequate number of viral copies(<138 copies/mL). A negative result must be combined with clinical observations, patient history, and epidemiological information. The expected result is Negative.  Fact Sheet for Patients:  bloggercourse.com  Fact Sheet for Healthcare Providers:  seriousbroker.it  This test is no t yet approved or cleared by the [place]  FDA and  has been authorized for detection and/or diagnosis of SARS-CoV-2 by FDA under an Emergency Use Authorization (EUA). This EUA will remain  in effect (meaning this test can be used) for the duration of the COVID-19 declaration under Section 564(b)(1) of the Act, 21 U.S.C.section 360bbb-3(b)(1), unless the authorization is terminated  or revoked sooner.       Influenza A by PCR POSITIVE (A) NEGATIVE Final   Influenza B by PCR NEGATIVE NEGATIVE Final    Comment: (NOTE) The Xpert Xpress SARS-CoV-2/FLU/RSV plus assay is intended as an aid in the diagnosis of influenza from Nasopharyngeal swab specimens and should not be used as a sole basis for treatment. Nasal washings and aspirates are unacceptable for Xpert Xpress SARS-CoV-2/FLU/RSV testing.  Fact Sheet for Patients: bloggercourse.com  Fact Sheet for Healthcare Providers: seriousbroker.it  This test is not yet approved or cleared by the [place]  FDA and has been authorized for detection and/or diagnosis of SARS-CoV-2 by FDA under an Emergency Use Authorization (EUA). This EUA will  remain in effect (meaning this test can be used) for the duration of the COVID-19 declaration under Section 564(b)(1) of the Act, 21 U.S.C. section 360bbb-3(b)(1), unless the authorization is terminated or revoked.     Resp Syncytial Virus by PCR NEGATIVE NEGATIVE Final    Comment: (NOTE) Fact Sheet for Patients: bloggercourse.com  Fact Sheet for Healthcare Providers: seriousbroker.it  This test is not yet approved or cleared by the [place]  FDA and has been authorized for detection and/or diagnosis of SARS-CoV-2 by FDA under an Emergency Use Authorization (EUA). This EUA will remain in effect (meaning this test can be used) for the duration of the COVID-19 declaration under Section 564(b)(1) of the Act, 21 U.S.C. section 360bbb-3(b)(1), unless the authorization is terminated or revoked.  Performed at [facility], [address], [place], [place] [address]   Urine Culture     Status: Abnormal   Collection Time: 10/20/24  7:36 PM   Specimen: Urine, Random  Result Value Ref Range Status   Specimen Description   Final    URINE, RANDOM Performed at [facility], [address] [place], [place] [address]    Special Requests   Final    NONE Reflexed from [id number] Performed at [facility], [address],  [place], [place] [address]    Culture >=100,000 COLONIES/mL ESCHERICHIA COLI (A)  Final   Report Status 10/23/2024 FINAL  Final   Organism ID, Bacteria ESCHERICHIA COLI (A)  Final      Susceptibility   Escherichia coli - MIC*    AMPICILLIN 4 SENSITIVE Sensitive     CEFAZOLIN  (URINE) Value in next row Sensitive      2 SENSITIVEThis is a modified FDA-approved test that has been validated and its performance characteristics determined by the reporting laboratory.  This laboratory is certified under the Clinical Laboratory Improvement Amendments CLIA as qualified to perform high complexity clinical laboratory testing.    CEFEPIME   Value in next row Sensitive      2 SENSITIVEThis is a modified FDA-approved test that has been validated and its performance characteristics determined by the reporting laboratory.  This laboratory is certified under the Clinical Laboratory Improvement Amendments CLIA as qualified to perform high complexity clinical laboratory testing.    ERTAPENEM Value in next row Sensitive      2 SENSITIVEThis is a modified FDA-approved test that has been validated and its performance characteristics determined by the reporting laboratory.  This laboratory is certified under the Clinical Laboratory Improvement Amendments CLIA as qualified to perform high complexity clinical laboratory testing.    CEFTRIAXONE  Value in next row Sensitive      2 SENSITIVEThis is a modified FDA-approved test that has been validated and its performance characteristics determined by the reporting laboratory.  This laboratory is certified under the Clinical Laboratory Improvement Amendments CLIA as qualified to perform high complexity clinical laboratory testing.    CIPROFLOXACIN  Value in next row Sensitive      2 SENSITIVEThis is a modified FDA-approved test that has been validated and its performance characteristics determined by the reporting laboratory.  This laboratory is certified under the Clinical Laboratory Improvement Amendments CLIA as qualified to perform high complexity clinical laboratory testing.    GENTAMICIN Value in next row Sensitive      2 SENSITIVEThis is a modified FDA-approved test that has been validated and its performance characteristics determined by the reporting laboratory.  This laboratory is certified under the Clinical Laboratory Improvement Amendments CLIA as qualified to perform high complexity clinical laboratory testing.    NITROFURANTOIN Value in next row Sensitive      2 SENSITIVEThis is a modified FDA-approved test that has been validated and its performance characteristics determined by the reporting  laboratory.  This laboratory is certified under the Clinical Laboratory Improvement Amendments CLIA as qualified to perform high complexity clinical laboratory testing.    TRIMETH /SULFA  Value in next row Sensitive      2 SENSITIVEThis is a modified FDA-approved test that has been validated and its performance characteristics determined by the reporting laboratory.  This laboratory is certified under the Clinical Laboratory Improvement Amendments CLIA as qualified to perform high complexity clinical laboratory testing.    AMPICILLIN/SULBACTAM Value in next row Sensitive      2 SENSITIVEThis is a modified FDA-approved test that has been validated and its performance characteristics determined by the reporting laboratory.  This laboratory is certified under the Clinical Laboratory Improvement Amendments CLIA as qualified to perform high complexity clinical laboratory testing.    PIP/TAZO Value in next row Sensitive      <=4 SENSITIVEThis is a modified FDA-approved test that has been validated and its performance characteristics determined by the reporting laboratory.  This laboratory is certified under the Clinical Laboratory Improvement Amendments CLIA as qualified to perform  high complexity clinical laboratory testing.    MEROPENEM Value in next row Sensitive      <=4 SENSITIVEThis is a modified FDA-approved test that has been validated and its performance characteristics determined by the reporting laboratory.  This laboratory is certified under the Clinical Laboratory Improvement Amendments CLIA as qualified to perform high complexity clinical laboratory testing.    * >=100,000 COLONIES/mL ESCHERICHIA COLI  MRSA Next Gen by PCR, Nasal     Status: None   Collection Time: 10/21/24  1:20 AM   Specimen: Nasal Mucosa; Nasal Swab  Result Value Ref Range Status   MRSA by PCR Next Gen NOT DETECTED NOT DETECTED Final    Comment: (NOTE) The GeneXpert MRSA Assay (FDA approved for NASAL specimens only), is one  component of a comprehensive MRSA colonization surveillance program. It is not intended to diagnose MRSA infection nor to guide or monitor treatment for MRSA infections. Test performance is not FDA approved in patients less than 13 years old. Performed at [facility], [address] [place], [place] [address]   Culture, blood (routine x 2) Call MD if unable to obtain prior to antibiotics being given     Status: None   Collection Time: 10/21/24  3:16 AM   Specimen: BLOOD  Result Value Ref Range Status   Specimen Description BLOOD BLOOD LEFT ARM  Final   Special Requests   Final    BOTTLES DRAWN AEROBIC AND ANAEROBIC Blood Culture adequate volume   Culture   Final    NO GROWTH 5 DAYS Performed at [facility], [address] [place], [place] [address]    Report Status 10/26/2024 FINAL  Final  Culture, blood (routine x 2) Call MD if unable to obtain prior to antibiotics being given     Status: None   Collection Time: 10/21/24  3:17 AM   Specimen: BLOOD  Result Value Ref Range Status   Specimen Description BLOOD BLOOD LEFT HAND  Final   Special Requests AEROBIC BOTTLE ONLY Blood Culture adequate volume  Final   Culture   Final    NO GROWTH 5 DAYS Performed at [facility], [address], [place], [place] [address]    Report Status 10/26/2024 FINAL  Final     Labs: BNP (last 3 results) No results for input(s): BNP in the last 8760 hours. Basic Metabolic Panel: Recent Labs  Lab 10/22/24 2150 10/23/24 0616 10/24/24 0438 10/24/24 1758 10/26/24 0555  NA 144 145 149* 143 139  K 3.7 3.3* 4.2 4.3 4.1  CL 109 109 114* 110 104  CO2 23 30 30  20* 27  GLUCOSE 412* 86 75 211* 202*  BUN 46* 35* 28* 24* 18  CREATININE 1.84* 1.51* 1.56* 1.39* 1.17*  CALCIUM  8.3* 8.3* 8.5* 8.5* 8.4*  MG  --   --   --   --  1.9  PHOS  --   --   --   --  2.5   Liver Function Tests: No results for input(s): AST, ALT, ALKPHOS, BILITOT, PROT, ALBUMIN  in the last 168 hours.  No results for  input(s): LIPASE, AMYLASE in the last 168 hours. No results for input(s): AMMONIA in the last 168 hours. CBC: Recent Labs  Lab 10/23/24 0616 10/26/24 0555  WBC 6.1 8.1  HGB 12.2 11.5*  HCT 39.3 35.9*  MCV 85.1 83.5  PLT 174 227   Cardiac Enzymes: No results for input(s): CKTOTAL, CKMB, CKMBINDEX, TROPONINI in the last 168 hours. BNP: Invalid input(s): POCBNP CBG: Recent Labs  Lab 10/27/24 0801 10/27/24 1134 10/27/24 1629 10/27/24 2047 10/28/24 0729  GLUCAP 142* 244* 192* 133* 238*   D-Dimer No results for input(s): DDIMER in the last 72 hours. Hgb A1c No results for input(s): HGBA1C in the last 72 hours. Lipid Profile No results for input(s): CHOL, HDL, LDLCALC, TRIG, CHOLHDL, LDLDIRECT in the last 72 hours. Thyroid  function studies No results for input(s): TSH, T4TOTAL, T3FREE, THYROIDAB in the last 72 hours.  Invalid input(s): FREET3 Anemia work up No results for input(s): VITAMINB12, FOLATE, FERRITIN, TIBC, IRON, RETICCTPCT in the last 72 hours. Urinalysis    Component Value Date/Time   COLORURINE AMBER (A) 10/20/2024 1936   APPEARANCEUR CLOUDY (A) 10/20/2024 1936   APPEARANCEUR Cloudy (A) 08/20/2023 0839   LABSPEC 1.014 10/20/2024 1936   PHURINE 5.0 10/20/2024 1936   GLUCOSEU NEGATIVE 10/20/2024 1936   HGBUR SMALL (A) 10/20/2024 1936   BILIRUBINUR NEGATIVE 10/20/2024 1936   BILIRUBINUR Negative 08/20/2023 0839   KETONESUR NEGATIVE 10/20/2024 1936   PROTEINUR 100 (A) 10/20/2024 1936   UROBILINOGEN negative 01/11/2015 1442   UROBILINOGEN 0.2 12/31/2014 0409   NITRITE NEGATIVE 10/20/2024 1936   LEUKOCYTESUR LARGE (A) 10/20/2024 1936   Sepsis Labs Recent Labs  Lab 10/23/24 0616 10/26/24 0555  WBC 6.1 8.1   Microbiology Recent Results (from the past 240 hours)  Resp panel by RT-PCR (RSV, Flu A&B, Covid) Anterior Nasal Swab     Status: Abnormal   Collection Time: 10/20/24  7:36 PM   Specimen:  Anterior Nasal Swab  Result Value Ref Range Status   SARS Coronavirus 2 by RT PCR NEGATIVE NEGATIVE Final    Comment: (NOTE) SARS-CoV-2 target nucleic acids are NOT DETECTED.  The SARS-CoV-2 RNA is generally detectable in upper respiratory specimens during the acute phase of infection. The lowest concentration of SARS-CoV-2 viral copies this assay can detect is 138 copies/mL. A negative result does not preclude SARS-Cov-2 infection and should not be used as the sole basis for treatment or other patient management decisions. A negative result may occur with  improper specimen collection/handling, submission of specimen other than nasopharyngeal swab, presence of viral mutation(s) within the areas targeted by this assay, and inadequate number of viral copies(<138 copies/mL). A negative result must be combined with clinical observations, patient history, and epidemiological information. The expected result is Negative.  Fact Sheet for Patients:  bloggercourse.com  Fact Sheet for Healthcare Providers:  seriousbroker.it  This test is no t yet approved or cleared by the [place]  FDA and  has been authorized for detection and/or diagnosis of SARS-CoV-2 by FDA under an Emergency Use Authorization (EUA). This EUA will remain  in effect (meaning this test can be used) for the duration of the COVID-19 declaration under Section 564(b)(1) of the Act, 21 U.S.C.section 360bbb-3(b)(1), unless the authorization is terminated  or revoked sooner.       Influenza A by PCR POSITIVE (A) NEGATIVE Final   Influenza B by PCR NEGATIVE NEGATIVE Final    Comment: (NOTE) The Xpert Xpress SARS-CoV-2/FLU/RSV plus assay is intended as an aid in the diagnosis of influenza from Nasopharyngeal swab specimens and should not be used as a sole basis for treatment. Nasal washings and aspirates are unacceptable for Xpert Xpress  SARS-CoV-2/FLU/RSV testing.  Fact Sheet for Patients: bloggercourse.com  Fact Sheet for Healthcare Providers: seriousbroker.it  This test is not yet approved or cleared by the [place]  FDA and has been authorized for detection and/or diagnosis of SARS-CoV-2 by FDA under an Emergency Use Authorization (  EUA). This EUA will remain in effect (meaning this test can be used) for the duration of the COVID-19 declaration under Section 564(b)(1) of the Act, 21 U.S.C. section 360bbb-3(b)(1), unless the authorization is terminated or revoked.     Resp Syncytial Virus by PCR NEGATIVE NEGATIVE Final    Comment: (NOTE) Fact Sheet for Patients: bloggercourse.com  Fact Sheet for Healthcare Providers: seriousbroker.it  This test is not yet approved or cleared by the [place]  FDA and has been authorized for detection and/or diagnosis of SARS-CoV-2 by FDA under an Emergency Use Authorization (EUA). This EUA will remain in effect (meaning this test can be used) for the duration of the COVID-19 declaration under Section 564(b)(1) of the Act, 21 U.S.C. section 360bbb-3(b)(1), unless the authorization is terminated or revoked.  Performed at [facility], [address], [place], [place] [address]   Urine Culture     Status: Abnormal   Collection Time: 10/20/24  7:36 PM   Specimen: Urine, Random  Result Value Ref Range Status   Specimen Description   Final    URINE, RANDOM Performed at [facility], [address] [place], [place] [address]    Special Requests   Final    NONE Reflexed from [phone] Performed at [facility], [address] [place], [place] [address]    Culture >=100,000 COLONIES/mL ESCHERICHIA COLI (A)  Final   Report Status 10/23/2024 FINAL  Final   Organism ID, Bacteria ESCHERICHIA COLI (A)  Final      Susceptibility   Escherichia coli - MIC*    AMPICILLIN 4  SENSITIVE Sensitive     CEFAZOLIN  (URINE) Value in next row Sensitive      2 SENSITIVEThis is a modified FDA-approved test that has been validated and its performance characteristics determined by the reporting laboratory.  This laboratory is certified under the Clinical Laboratory Improvement Amendments CLIA as qualified to perform high complexity clinical laboratory testing.    CEFEPIME  Value in next row Sensitive      2 SENSITIVEThis is a modified FDA-approved test that has been validated and its performance characteristics determined by the reporting laboratory.  This laboratory is certified under the Clinical Laboratory Improvement Amendments CLIA as qualified to perform high complexity clinical laboratory testing.    ERTAPENEM Value in next row Sensitive      2 SENSITIVEThis is a modified FDA-approved test that has been validated and its performance characteristics determined by the reporting laboratory.  This laboratory is certified under the Clinical Laboratory Improvement Amendments CLIA as qualified to perform high complexity clinical laboratory testing.    CEFTRIAXONE  Value in next row Sensitive      2 SENSITIVEThis is a modified FDA-approved test that has been validated and its performance characteristics determined by the reporting laboratory.  This laboratory is certified under the Clinical Laboratory Improvement Amendments CLIA as qualified to perform high complexity clinical laboratory testing.    CIPROFLOXACIN  Value in next row Sensitive      2 SENSITIVEThis is a modified FDA-approved test that has been validated and its performance characteristics determined by the reporting laboratory.  This laboratory is certified under the Clinical Laboratory Improvement Amendments CLIA as qualified to perform high complexity clinical laboratory testing.    GENTAMICIN Value in next row Sensitive      2 SENSITIVEThis is a modified FDA-approved test that has been validated and its performance  characteristics determined by the reporting laboratory.  This laboratory is certified under the Clinical Laboratory Improvement Amendments CLIA as qualified to  perform high complexity clinical laboratory testing.    NITROFURANTOIN Value in next row Sensitive      2 SENSITIVEThis is a modified FDA-approved test that has been validated and its performance characteristics determined by the reporting laboratory.  This laboratory is certified under the Clinical Laboratory Improvement Amendments CLIA as qualified to perform high complexity clinical laboratory testing.    TRIMETH /SULFA  Value in next row Sensitive      2 SENSITIVEThis is a modified FDA-approved test that has been validated and its performance characteristics determined by the reporting laboratory.  This laboratory is certified under the Clinical Laboratory Improvement Amendments CLIA as qualified to perform high complexity clinical laboratory testing.    AMPICILLIN/SULBACTAM Value in next row Sensitive      2 SENSITIVEThis is a modified FDA-approved test that has been validated and its performance characteristics determined by the reporting laboratory.  This laboratory is certified under the Clinical Laboratory Improvement Amendments CLIA as qualified to perform high complexity clinical laboratory testing.    PIP/TAZO Value in next row Sensitive      <=4 SENSITIVEThis is a modified FDA-approved test that has been validated and its performance characteristics determined by the reporting laboratory.  This laboratory is certified under the Clinical Laboratory Improvement Amendments CLIA as qualified to perform high complexity clinical laboratory testing.    MEROPENEM Value in next row Sensitive      <=4 SENSITIVEThis is a modified FDA-approved test that has been validated and its performance characteristics determined by the reporting laboratory.  This laboratory is certified under the Clinical Laboratory Improvement Amendments CLIA as qualified to  perform high complexity clinical laboratory testing.    * >=100,000 COLONIES/mL ESCHERICHIA COLI  MRSA Next Gen by PCR, Nasal     Status: None   Collection Time: 10/21/24  1:20 AM   Specimen: Nasal Mucosa; Nasal Swab  Result Value Ref Range Status   MRSA by PCR Next Gen NOT DETECTED NOT DETECTED Final    Comment: (NOTE) The GeneXpert MRSA Assay (FDA approved for NASAL specimens only), is one component of a comprehensive MRSA colonization surveillance program. It is not intended to diagnose MRSA infection nor to guide or monitor treatment for MRSA infections. Test performance is not FDA approved in patients less than 72 years old. Performed at [facility], [address], [place], [place] [address]   Culture, blood (routine x 2) Call MD if unable to obtain prior to antibiotics being given     Status: None   Collection Time: 10/21/24  3:16 AM   Specimen: BLOOD  Result Value Ref Range Status   Specimen Description BLOOD BLOOD LEFT ARM  Final   Special Requests   Final    BOTTLES DRAWN AEROBIC AND ANAEROBIC Blood Culture adequate volume   Culture   Final    NO GROWTH 5 DAYS Performed at [facility], [address] [place], [place] [address]    Report Status 10/26/2024 FINAL  Final  Culture, blood (routine x 2) Call MD if unable to obtain prior to antibiotics being given     Status: None   Collection Time: 10/21/24  3:17 AM   Specimen: BLOOD  Result Value Ref Range Status   Specimen Description BLOOD BLOOD LEFT HAND  Final   Special Requests AEROBIC BOTTLE ONLY Blood Culture adequate volume  Final   Culture   Final    NO GROWTH 5 DAYS Performed at [facility], [address] [place], [place] [address]    Report Status 10/26/2024  FINAL  Final     Time coordinating discharge:  I have spent 35 minutes face to face with the patient and on the ward discussing the patients care, assessment, plan and disposition with other care givers. >50% of the time was devoted counseling the  patient about the risks and benefits of treatment/Discharge disposition and coordinating care.   SIGNED:   Harlene RAYMOND Bowl, DO [facility] 10/28/2024, 10:22 AM   If 7PM-7AM, please contact night-coverage      [1]  Allergies Allergen Reactions   Lipitor [Atorvastatin ] Other (See Comments)    Constipation    Soma [Carisoprodol] Nausea And Vomiting

## 2024-10-28 NOTE — TOC Progression Note (Signed)
 Transition of Care [facility]) - Progression Note    Patient Details  Name: Jamie [name] MRN: [id number] Date of Birth: 08/25/1951  Transition of Care [facility]) CM/SW Contact  Noreen KATHEE [place], [place] Phone Number: 10/28/2024, 2:33 PM  Clinical Narrative:     CSW called Debbie this AM to see if patient bed was ready today like she said it would be. Debbie shared that the bed patient would have went to, the patient did not DC. Debbie asked if patient was incontinent because she had a ward room but it did not have a bathroom. CSW checked with nurse and nurse stated that patient was continent. Marval stated that she may have a bed later, CSW called Debbie back at 2:33pm and she said that she will likely have a bed tomorrow . Nurse, MD and family made aware.   Expected Discharge Plan: Skilled Nursing Facility Barriers to Discharge: No SNF bed     Expected Discharge Plan and Services In-house Referral: Clinical Social Work Discharge Planning Services: NA Post Acute Care Choice: Home Health Living arrangements for the past 2 months: Single Family Home Expected Discharge Date: 10/28/24               DME Arranged: N/A DME Agency: NA       HH Arranged: RN, PT, OT, Nurse's Aide HH Agency: [facility]         Social Drivers of Health (SDOH) Interventions SDOH Screenings   Food Insecurity: Patient Unable To Answer (10/21/2024)  Housing: Unknown (10/21/2024)  Transportation Needs: Patient Unable To Answer (10/21/2024)  Utilities: Patient Unable To Answer (10/21/2024)  Alcohol Screen: Low Risk (12/30/2022)  Depression (PHQ2-9): Low Risk (07/13/2024)  Financial Resource Strain: Low Risk (12/30/2022)  Physical Activity: Insufficiently Active (12/30/2022)  Social Connections: Patient Unable To Answer (10/21/2024)  Stress: No Stress Concern Present (12/30/2022)  Tobacco Use: High Risk (10/25/2024)    Readmission Risk Interventions    10/28/2024    2:32 PM 10/25/2024    9:04 AM 10/22/2024     8:57 AM  Readmission Risk Prevention Plan  Transportation Screening Complete Complete Complete  PCP or Specialist Appt within 3-5 Days   Complete  HRI or Home Care Consult Complete Complete Complete  Social Work Consult for Recovery Care Planning/Counseling Complete Complete Complete  Palliative Care Screening Complete Not Applicable Not Applicable  Medication Review Oceanographer) Complete Complete Complete

## 2024-10-29 LAB — GLUCOSE, CAPILLARY
Glucose-Capillary: 100 mg/dL — ABNORMAL HIGH (ref 70–99)
Glucose-Capillary: 388 mg/dL — ABNORMAL HIGH (ref 70–99)
Glucose-Capillary: 339 mg/dL — ABNORMAL HIGH (ref 70–99)
Glucose-Capillary: 71 mg/dL (ref 70–99)
Glucose-Capillary: 168 mg/dL — ABNORMAL HIGH (ref 70–99)

## 2024-10-29 NOTE — Progress Notes (Signed)
 Patient continues to wait skilled nursing facility placement.    Per nursing patient eating well and has no complaints.  Currently napping.    Physician Discharge Summary  Jamie [name] FMW:[id number] DOB: November 03, 1950 DOA: 10/20/2024  PCP: Lavell Bari LABOR, FNP  Admit date: 10/20/2024 Discharge date: 10/29/2024  Admitted From: Home Disposition: SNF  Recommendations for Outpatient Follow-up:  Follow up with PCP in 1-2 weeks Please obtain BMP/CBC in 3-5 days Recommend outpatient follow-up with palliative care services    Discharge Condition: Stable CODE STATUS: Full code Diet recommendation: Diet cardiac  Brief/Interim Summary: Brief Narrative:   74 y.o. female with medical history significant of hypertension, advanced dementia, hyperlipidemia, T2DM, COPD, dementia who presents to the emergency department from home via EMS due to generalized weakness and confusion.  Upon admission patient was found to have multifocal community-acquired pneumonia secondary to influenza A infection complicated by dehydration, hypernatremia, AKI, UTI and failure to thrive. Eventually she started slowly improving.  Due to her advanced dementia and comorbidities, had palliative care discussion and patient and family opted for to continue medical treatment.  PT OT recommended SNF  Assessment & Plan:  Multifocal bacterial community-acquired pneumonia, POA Influenza A  Improving.  finished. Augmentin .  Completed 5 days of Tamiflu .  Continue home bronchodilators.   Hypernatremia,POA Acute kidney injury with metabolic acidosis Adult failure to thrive with severe malnutrition - Advised oral hydration at home.  Recommended to recheck BMP outpatient in next 5 to 7 days.  Antihypertensive losartan  may need to be adjusted depending on renal function and her oral intake -Unfortunately due to advanced dementia her p.o. intake remains poor.  Encourage p.o. nutrition - Palliative care team to continue following  outpatient   UTI: Treated with antibiotics    Type 2 diabetes mellitus with hyperglycemia, POA:  A1c on 07/13/2024 was 7.7%.   Sliding scale and Accu-Chek and long-acting.  Adjust as necessary     COPD, POA:  - no current exacerbation.  Continue with albuterol  and Breztri . - hasn't had any albuterol  b/c not aware enough to ask for it.  Lungs sound good, no evidence of exacerbation currently.    Alzheimer's dementia, POA: Continue with Aricept  and Namenda  - advanced, complicating care   Hyperlipidemia, POA:  Continue with statin   Hypertension, POA:  Continue with amlodipine .  Holding losartan  in the setting of acute kidney injury. IV as needed  Seen by palliative care service   DVT prophylaxis: Subcu heparin     Code Status: Full Code Family Communication:   Status is: Inpatient Remains inpatient appropriate because: Discharge    PT Follow up Recs: Skilled Nursing-Short Term Rehab (<3 Hours/Day)10/24/2024 1116      Discharge Diagnoses:  Principal Problem:   Multifocal pneumonia Active Problems:   Protein-calorie malnutrition, severe      Discharge Exam: Vitals:   10/29/24 0413 10/29/24 0856  BP: 111/69   Pulse: 81   Resp: 18   Temp: 97.8 F (36.6 C)   SpO2: 98% 100%   Vitals:   10/28/24 2244 10/29/24 0123 10/29/24 0413 10/29/24 0856  BP: 105/65 119/66 111/69   Pulse: 95 86 81   Resp: 19 18 18    Temp: 98.9 F (37.2 C) 98.1 F (36.7 C) 97.8 F (36.6 C)   TempSrc: Oral Oral Oral   SpO2: 98% 96% 98% 100%  Weight:      Height:          Discharge Instructions   Allergies as of 10/29/2024  Reactions   Lipitor [atorvastatin ] Other (See Comments)   Constipation    Soma [carisoprodol] Nausea And Vomiting        Medication List     STOP taking these medications    risperiDONE  0.5 MG tablet Commonly known as: RISPERDAL        TAKE these medications    albuterol  (2.5 MG/3ML) 0.083% nebulizer solution Commonly known as:  PROVENTIL  Take 3 mLs (2.5 mg total) by nebulization every 6 (six) hours as needed for wheezing or shortness of breath.   amLODipine  10 MG tablet Commonly known as: NORVASC  Take 1 tablet (10 mg total) by mouth daily.   aspirin  EC 81 MG tablet Take 1 tablet (81 mg total) by mouth daily with breakfast. Swallow whole.   donepezil  10 MG tablet Commonly known as: ARICEPT  Take 1 tablet (10 mg total) by mouth at bedtime.   Glucose Instant Energy 4-6 GM-MG Chew chewable tablet Generic drug: glucose-Vitamin C Chew 1 tablet by mouth as needed for low blood sugar.   Lantus  SoloStar 100 UNIT/ML Solostar Pen Generic drug: insulin  glargine Inject 18 Units into the skin at bedtime.   losartan  100 MG tablet Commonly known as: COZAAR  Take 1 tablet (100 mg total) by mouth daily.   memantine  10 MG tablet Commonly known as: NAMENDA  Take 1 tablet (10 mg total) by mouth 2 (two) times daily.   pravastatin  40 MG tablet Commonly known as: PRAVACHOL  Take 1 tablet (40 mg total) by mouth daily.   Trelegy Ellipta  100-62.5-25 MCG/ACT Aepb Generic drug: Fluticasone -Umeclidin-Vilant One click each am        Contact information for after-discharge care     Destination     [facility] .   Service: Skilled Nursing Contact information: [address] [place]  [address] [phone]             Home Medical Care     [facility]) .   Service: Home Health Services Contact information: [address] [place]  [address] [phone]                    Allergies[1]  You were cared for by a hospitalist during your hospital stay. If you have any questions about your discharge medications or the care you received while you were in the hospital after you are discharged, you can call the unit and asked to speak with the hospitalist on call if the hospitalist that took  care of you is not available. Once you are discharged, your primary care physician will handle any further medical issues. Please note that no refills for any discharge medications will be authorized once you are discharged, as it is imperative that you return to your primary care physician (or establish a relationship with a primary care physician if you do not have one) for your aftercare needs so that they can reassess your need for medications and monitor your lab values.  You were cared for by a hospitalist during your hospital stay. If you have any questions about your discharge medications or the care you received while you were in the hospital after you are discharged, you can call the unit and asked to speak with the hospitalist on call if the hospitalist that took care of you is not available. Once you are discharged, your primary care physician will handle any further medical issues. Please note that NO REFILLS for any discharge medications will be  authorized once you are discharged, as it is imperative that you return to your primary care physician (or establish a relationship with a primary care physician if you do not have one) for your aftercare needs so that they can reassess your need for medications and monitor your lab values.  Please request your Prim.MD to go over all Hospital Tests and Procedure/Radiological results at the follow up, please get all Hospital records sent to your Prim MD by signing hospital release before you go home.  Get CBC, CMP, 2 view Chest X ray checked  by Primary MD during your next visit or SNF MD in 5-7 days ( we routinely change or add medications that can affect your baseline labs and fluid status, therefore we recommend that you get the mentioned basic workup next visit with your PCP, your PCP may decide not to get them or add new tests based on their clinical decision)  On your next visit with your primary care physician please Get Medicines reviewed and  adjusted.  If you experience worsening of your admission symptoms, develop shortness of breath, life threatening emergency, suicidal or homicidal thoughts you must seek medical attention immediately by calling 911 or calling your MD immediately  if symptoms less severe.  You Must read complete instructions/literature along with all the possible adverse reactions/side effects for all the Medicines you take and that have been prescribed to you. Take any new Medicines after you have completely understood and accpet all the possible adverse reactions/side effects.   Do not drive, operate heavy machinery, perform activities at heights, swimming or participation in water activities or provide baby sitting services if your were admitted for syncope or siezures until you have seen by Primary MD or a Neurologist and advised to do so again.  Do not drive when taking Pain medications.   Procedures/Studies: CT Cervical Spine Wo Contrast Result Date: 10/20/2024 EXAM: CT CERVICAL SPINE WITHOUT CONTRAST 10/20/2024 08:45:38 PM TECHNIQUE: CT of the cervical spine was performed without the administration of intravenous contrast. Multiplanar reformatted images are provided for review. Automated exposure control, iterative reconstruction, and/or weight based adjustment of the mA/kV was utilized to reduce the radiation dose to as low as reasonably achievable. COMPARISON: 11/23/2023 CLINICAL HISTORY: Neck trauma (Age >= 65y) FINDINGS: BONES AND ALIGNMENT: No acute fracture or traumatic malalignment. DEGENERATIVE CHANGES: Prior ACDF at C6-C7. Diffuse degenerative disc and facet disease. SOFT TISSUES: No prevertebral soft tissue swelling. IMPRESSION: 1. No acute findings. 2. Postoperative and degenerative changes. Electronically signed by: Franky Crease MD MD 10/20/2024 09:08 PM EST RP Workstation: [id number]   CT Head Wo Contrast Result Date: 10/20/2024 EXAM: CT HEAD WITHOUT CONTRAST 10/20/2024 08:45:38 PM TECHNIQUE: CT of the  head was performed without the administration of intravenous contrast. Automated exposure control, iterative reconstruction, and/or weight based adjustment of the mA/kV was utilized to reduce the radiation dose to as low as reasonably achievable. COMPARISON: 11/23/2023 CLINICAL HISTORY: Mental status change, unknown cause. FINDINGS: BRAIN AND VENTRICLES: No acute hemorrhage. No evidence of acute infarct. No hydrocephalus. No extra-axial collection. No mass effect or midline shift. Atrophy and chronic small vessel disease throughout the deep white matter. ORBITS: No acute abnormality. SINUSES: No acute abnormality. SOFT TISSUES AND SKULL: No acute soft tissue abnormality. No skull fracture. IMPRESSION: 1. No acute intracranial abnormality. 2. Atrophy, chronic small vessel disease. Electronically signed by: Franky Crease MD MD 10/20/2024 09:06 PM EST RP Workstation: [id number]   CT CHEST ABDOMEN PELVIS WO CONTRAST Result Date: 10/20/2024 EXAM: CT CHEST,  ABDOMEN AND PELVIS WITHOUT CONTRAST 10/20/2024 08:45:38 PM TECHNIQUE: CT of the chest, abdomen and pelvis was performed without the administration of intravenous contrast. Multiplanar reformatted images are provided for review. Automated exposure control, iterative reconstruction, and/or weight based adjustment of the mA/kV was utilized to reduce the radiation dose to as low as reasonably achievable. COMPARISON: Chest radiograph 10/20/2024, CT abdomen and pelvis 12/31/2014, CT chest 08/25/2013, and CT thoracolumbar spine 11/23/2023. CLINICAL HISTORY: Polytrauma, blunt. Weakness and altered mental status. FINDINGS: CHEST: MEDIASTINUM AND LYMPH NODES: Heart and pericardium are unremarkable. Normal heart size. No pericardial effusion. The central airways are clear. No mediastinal, hilar or axillary lymphadenopathy. The thyroid  gland is unremarkable. The esophagus is decompressed. Calcification of the aorta and coronary arteries. No aortic aneurysm. LUNGS AND PLEURA:  Emphysematous changes diffusely throughout the lungs. Bronchiectasis with bronchial wall thickening. Multiple nodular peribronchial infiltrates bilaterally. These changes are likely to represent chronic bronchitis with acute multifocal bronchopneumonia versus aspiration. No pleural effusion or pneumothorax. ABDOMEN AND PELVIS: LIMITATIONS/ARTIFACTS: Streak artifact from arms and hardware-limited evaluation of the abdomen and pelvis. LIVER: As visualized, the liver is unremarkable. GALLBLADDER AND BILE DUCTS: As visualized, the gallbladder is unremarkable. No biliary ductal dilatation. SPLEEN: As visualized, the spleen is unremarkable. PANCREAS: As visualized, the pancreas is unremarkable. ADRENAL GLANDS: As visualized, the adrenal glands are unremarkable. KIDNEYS, URETERS AND BLADDER: As visualized, the kidneys and ureters are unremarkable. No stones in the kidneys or ureters. No hydronephrosis. No perinephric or periureteral stranding. Gas in the bladder. This could arise from catheterization if performed recently or from infection. Correlate with urinalysis. GI AND BOWEL: Stomach, small bowel, and the colon are mostly decompressed. There is no bowel obstruction. REPRODUCTIVE ORGANS: As visualized, the uterus and ovaries are unremarkable. PERITONEUM AND RETROPERITONEUM: No ascites. No free air. VASCULATURE: Aorta is normal in caliber. Dense calcification of the abdominal aorta. No aneurysm. ABDOMINAL AND PELVIS LYMPH NODES: No lymphadenopathy. BONES AND SOFT TISSUES: Postoperative posterior rod and screw fixation from T12 through L4. Anterior fixation of the cervical spine. Degenerative changes throughout the lumbar spine and cervical spine with narrowed interspaces and endplate osteophyte formation. Visualized surgical hardware appears intact. No acute bony abnormalities. Infiltration in the anterior abdominal wall subcutaneous fat consistent with injection sites. Circumscribed cystic lesion in the left pelvic  subcutaneous fat measuring 2.2 cm in diameter, likely a sebaceous cyst. Calcified phleboliths in the pelvis. IMPRESSION: 1. No evidence of acute traumatic injury. 2. Multifocal bronchopneumonia versus aspiration. Chronic bronchitic changes with bronchiectasis. 3. Gas in the urinary bladder, which may be related to recent catheterization or infection; recommend urinalysis. Electronically signed by: Elsie Gravely MD 10/20/2024 08:56 PM EST RP Workstation: [id number]   DG Chest Port 1 View Result Date: 10/20/2024 EXAM: 1 VIEW(S) XRAY OF THE CHEST 10/20/2024 07:52:00 PM COMPARISON: CT of the chest 08/25/2013. CLINICAL HISTORY: Questionable sepsis - evaluate for abnormality. FINDINGS: LUNGS AND PLEURA: Lungs are hyperinflated, likely related to emphysema. A small nodular density projects over the left lower lung measuring 5 mm which is indeterminate. No pleural effusion. No pneumothorax. HEART AND MEDIASTINUM: Atherosclerotic calcifications of the aorta. No acute abnormality of the cardiac and mediastinal silhouettes. BONES AND SOFT TISSUES: Thoracic and cervical spinal fusion hardware present. No acute osseous abnormality. IMPRESSION: 1. No acute findings. 2. Hyperinflated lungs, likely related to emphysema. 3. Small indeterminate 5 mm nodular density projecting over the left lower lung. Follow-up chest CT recommended. Electronically signed by: Greig Pique MD MD 10/20/2024 07:59 PM EST RP Workstation: [id number]  The results of significant diagnostics from this hospitalization (including imaging, microbiology, ancillary and laboratory) are listed below for reference.     Microbiology: Recent Results (from the past 240 hours)  Resp panel by RT-PCR (RSV, Flu A&B, Covid) Anterior Nasal Swab     Status: Abnormal   Collection Time: 10/20/24  7:36 PM   Specimen: Anterior Nasal Swab  Result Value Ref Range Status   SARS Coronavirus 2 by RT PCR NEGATIVE NEGATIVE Final    Comment: (NOTE) SARS-CoV-2 target  nucleic acids are NOT DETECTED.  The SARS-CoV-2 RNA is generally detectable in upper respiratory specimens during the acute phase of infection. The lowest concentration of SARS-CoV-2 viral copies this assay can detect is 138 copies/mL. A negative result does not preclude SARS-Cov-2 infection and should not be used as the sole basis for treatment or other patient management decisions. A negative result may occur with  improper specimen collection/handling, submission of specimen other than nasopharyngeal swab, presence of viral mutation(s) within the areas targeted by this assay, and inadequate number of viral copies(<138 copies/mL). A negative result must be combined with clinical observations, patient history, and epidemiological information. The expected result is Negative.  Fact Sheet for Patients:  bloggercourse.com  Fact Sheet for Healthcare Providers:  seriousbroker.it  This test is no t yet approved or cleared by the [place]  FDA and  has been authorized for detection and/or diagnosis of SARS-CoV-2 by FDA under an Emergency Use Authorization (EUA). This EUA will remain  in effect (meaning this test can be used) for the duration of the COVID-19 declaration under Section 564(b)(1) of the Act, 21 U.S.C.section 360bbb-3(b)(1), unless the authorization is terminated  or revoked sooner.       Influenza A by PCR POSITIVE (A) NEGATIVE Final   Influenza B by PCR NEGATIVE NEGATIVE Final    Comment: (NOTE) The Xpert Xpress SARS-CoV-2/FLU/RSV plus assay is intended as an aid in the diagnosis of influenza from Nasopharyngeal swab specimens and should not be used as a sole basis for treatment. Nasal washings and aspirates are unacceptable for Xpert Xpress SARS-CoV-2/FLU/RSV testing.  Fact Sheet for Patients: bloggercourse.com  Fact Sheet for Healthcare  Providers: seriousbroker.it  This test is not yet approved or cleared by the [place]  FDA and has been authorized for detection and/or diagnosis of SARS-CoV-2 by FDA under an Emergency Use Authorization (EUA). This EUA will remain in effect (meaning this test can be used) for the duration of the COVID-19 declaration under Section 564(b)(1) of the Act, 21 U.S.C. section 360bbb-3(b)(1), unless the authorization is terminated or revoked.     Resp Syncytial Virus by PCR NEGATIVE NEGATIVE Final    Comment: (NOTE) Fact Sheet for Patients: bloggercourse.com  Fact Sheet for Healthcare Providers: seriousbroker.it  This test is not yet approved or cleared by the [place]  FDA and has been authorized for detection and/or diagnosis of SARS-CoV-2 by FDA under an Emergency Use Authorization (EUA). This EUA will remain in effect (meaning this test can be used) for the duration of the COVID-19 declaration under Section 564(b)(1) of the Act, 21 U.S.C. section 360bbb-3(b)(1), unless the authorization is terminated or revoked.  Performed at [facility], [address] [place], [place] [address]   Urine Culture     Status: Abnormal   Collection Time: 10/20/24  7:36 PM   Specimen: Urine, Random  Result Value Ref Range Status   Specimen Description   Final    URINE, RANDOM Performed at [facility], [address] [place],  [place] [address]    Special Requests   Final    NONE Reflexed from [id number] Performed at [facility], [address] [place], [place] [address]    Culture >=100,000 COLONIES/mL ESCHERICHIA COLI (A)  Final   Report Status 10/23/2024 FINAL  Final   Organism ID, Bacteria ESCHERICHIA COLI (A)  Final      Susceptibility   Escherichia coli - MIC*    AMPICILLIN 4 SENSITIVE Sensitive     CEFAZOLIN  (URINE) Value in next row Sensitive      2 SENSITIVEThis is a modified FDA-approved test that has  been validated and its performance characteristics determined by the reporting laboratory.  This laboratory is certified under the Clinical Laboratory Improvement Amendments CLIA as qualified to perform high complexity clinical laboratory testing.    CEFEPIME  Value in next row Sensitive      2 SENSITIVEThis is a modified FDA-approved test that has been validated and its performance characteristics determined by the reporting laboratory.  This laboratory is certified under the Clinical Laboratory Improvement Amendments CLIA as qualified to perform high complexity clinical laboratory testing.    ERTAPENEM Value in next row Sensitive      2 SENSITIVEThis is a modified FDA-approved test that has been validated and its performance characteristics determined by the reporting laboratory.  This laboratory is certified under the Clinical Laboratory Improvement Amendments CLIA as qualified to perform high complexity clinical laboratory testing.    CEFTRIAXONE  Value in next row Sensitive      2 SENSITIVEThis is a modified FDA-approved test that has been validated and its performance characteristics determined by the reporting laboratory.  This laboratory is certified under the Clinical Laboratory Improvement Amendments CLIA as qualified to perform high complexity clinical laboratory testing.    CIPROFLOXACIN  Value in next row Sensitive      2 SENSITIVEThis is a modified FDA-approved test that has been validated and its performance characteristics determined by the reporting laboratory.  This laboratory is certified under the Clinical Laboratory Improvement Amendments CLIA as qualified to perform high complexity clinical laboratory testing.    GENTAMICIN Value in next row Sensitive      2 SENSITIVEThis is a modified FDA-approved test that has been validated and its performance characteristics determined by the reporting laboratory.  This laboratory is certified under the Clinical Laboratory Improvement Amendments CLIA  as qualified to perform high complexity clinical laboratory testing.    NITROFURANTOIN Value in next row Sensitive      2 SENSITIVEThis is a modified FDA-approved test that has been validated and its performance characteristics determined by the reporting laboratory.  This laboratory is certified under the Clinical Laboratory Improvement Amendments CLIA as qualified to perform high complexity clinical laboratory testing.    TRIMETH /SULFA  Value in next row Sensitive      2 SENSITIVEThis is a modified FDA-approved test that has been validated and its performance characteristics determined by the reporting laboratory.  This laboratory is certified under the Clinical Laboratory Improvement Amendments CLIA as qualified to perform high complexity clinical laboratory testing.    AMPICILLIN/SULBACTAM Value in next row Sensitive      2 SENSITIVEThis is a modified FDA-approved test that has been validated and its performance characteristics determined by the reporting laboratory.  This laboratory is certified under the Clinical Laboratory Improvement Amendments CLIA as qualified to perform high complexity clinical laboratory testing.    PIP/TAZO Value in next row Sensitive      <=4 SENSITIVEThis is a modified FDA-approved test that has been validated  and its performance characteristics determined by the reporting laboratory.  This laboratory is certified under the Clinical Laboratory Improvement Amendments CLIA as qualified to perform high complexity clinical laboratory testing.    MEROPENEM Value in next row Sensitive      <=4 SENSITIVEThis is a modified FDA-approved test that has been validated and its performance characteristics determined by the reporting laboratory.  This laboratory is certified under the Clinical Laboratory Improvement Amendments CLIA as qualified to perform high complexity clinical laboratory testing.    * >=100,000 COLONIES/mL ESCHERICHIA COLI  MRSA Next Gen by PCR, Nasal     Status: None    Collection Time: 10/21/24  1:20 AM   Specimen: Nasal Mucosa; Nasal Swab  Result Value Ref Range Status   MRSA by PCR Next Gen NOT DETECTED NOT DETECTED Final    Comment: (NOTE) The GeneXpert MRSA Assay (FDA approved for NASAL specimens only), is one component of a comprehensive MRSA colonization surveillance program. It is not intended to diagnose MRSA infection nor to guide or monitor treatment for MRSA infections. Test performance is not FDA approved in patients less than 29 years old. Performed at [facility], [address] [place], [place] [address]   Culture, blood (routine x 2) Call MD if unable to obtain prior to antibiotics being given     Status: None   Collection Time: 10/21/24  3:16 AM   Specimen: BLOOD  Result Value Ref Range Status   Specimen Description BLOOD BLOOD LEFT ARM  Final   Special Requests   Final    BOTTLES DRAWN AEROBIC AND ANAEROBIC Blood Culture adequate volume   Culture   Final    NO GROWTH 5 DAYS Performed at [facility], [address] [place], [place] [address]    Report Status 10/26/2024 FINAL  Final  Culture, blood (routine x 2) Call MD if unable to obtain prior to antibiotics being given     Status: None   Collection Time: 10/21/24  3:17 AM   Specimen: BLOOD  Result Value Ref Range Status   Specimen Description BLOOD BLOOD LEFT HAND  Final   Special Requests AEROBIC BOTTLE ONLY Blood Culture adequate volume  Final   Culture   Final    NO GROWTH 5 DAYS Performed at [facility], [address], [place], [place] [address]    Report Status 10/26/2024 FINAL  Final     Labs: BNP (last 3 results) No results for input(s): BNP in the last 8760 hours. Basic Metabolic Panel: Recent Labs  Lab 10/22/24 2150 10/23/24 0616 10/24/24 0438 10/24/24 1758 10/26/24 0555  NA 144 145 149* 143 139  K 3.7 3.3* 4.2 4.3 4.1  CL 109 109 114* 110 104  CO2 23 30 30  20* 27  GLUCOSE 412* 86 75 211* 202*  BUN 46* 35* 28* 24* 18  CREATININE 1.84* 1.51*  1.56* 1.39* 1.17*  CALCIUM  8.3* 8.3* 8.5* 8.5* 8.4*  MG  --   --   --   --  1.9  PHOS  --   --   --   --  2.5   Liver Function Tests: No results for input(s): AST, ALT, ALKPHOS, BILITOT, PROT, ALBUMIN  in the last 168 hours.  No results for input(s): LIPASE, AMYLASE in the last 168 hours. No results for input(s): AMMONIA in the last 168 hours. CBC: Recent Labs  Lab 10/23/24 0616 10/26/24 0555  WBC 6.1 8.1  HGB 12.2 11.5*  HCT 39.3 35.9*  MCV 85.1 83.5  PLT 174  227   Cardiac Enzymes: No results for input(s): CKTOTAL, CKMB, CKMBINDEX, TROPONINI in the last 168 hours. BNP: Invalid input(s): POCBNP CBG: Recent Labs  Lab 10/28/24 1118 10/28/24 1626 10/28/24 1639 10/28/24 1929 10/29/24 0735  GLUCAP 285* 64* 144* 239* 100*   D-Dimer No results for input(s): DDIMER in the last 72 hours. Hgb A1c No results for input(s): HGBA1C in the last 72 hours. Lipid Profile No results for input(s): CHOL, HDL, LDLCALC, TRIG, CHOLHDL, LDLDIRECT in the last 72 hours. Thyroid  function studies No results for input(s): TSH, T4TOTAL, T3FREE, THYROIDAB in the last 72 hours.  Invalid input(s): FREET3 Anemia work up No results for input(s): VITAMINB12, FOLATE, FERRITIN, TIBC, IRON, RETICCTPCT in the last 72 hours. Urinalysis    Component Value Date/Time   COLORURINE AMBER (A) 10/20/2024 1936   APPEARANCEUR CLOUDY (A) 10/20/2024 1936   APPEARANCEUR Cloudy (A) 08/20/2023 0839   LABSPEC 1.014 10/20/2024 1936   PHURINE 5.0 10/20/2024 1936   GLUCOSEU NEGATIVE 10/20/2024 1936   HGBUR SMALL (A) 10/20/2024 1936   BILIRUBINUR NEGATIVE 10/20/2024 1936   BILIRUBINUR Negative 08/20/2023 0839   KETONESUR NEGATIVE 10/20/2024 1936   PROTEINUR 100 (A) 10/20/2024 1936   UROBILINOGEN negative 01/11/2015 1442   UROBILINOGEN 0.2 12/31/2014 0409   NITRITE NEGATIVE 10/20/2024 1936   LEUKOCYTESUR LARGE (A) 10/20/2024 1936   Sepsis  Labs Recent Labs  Lab 10/23/24 0616 10/26/24 0555  WBC 6.1 8.1   Microbiology Recent Results (from the past 240 hours)  Resp panel by RT-PCR (RSV, Flu A&B, Covid) Anterior Nasal Swab     Status: Abnormal   Collection Time: 10/20/24  7:36 PM   Specimen: Anterior Nasal Swab  Result Value Ref Range Status   SARS Coronavirus 2 by RT PCR NEGATIVE NEGATIVE Final    Comment: (NOTE) SARS-CoV-2 target nucleic acids are NOT DETECTED.  The SARS-CoV-2 RNA is generally detectable in upper respiratory specimens during the acute phase of infection. The lowest concentration of SARS-CoV-2 viral copies this assay can detect is 138 copies/mL. A negative result does not preclude SARS-Cov-2 infection and should not be used as the sole basis for treatment or other patient management decisions. A negative result may occur with  improper specimen collection/handling, submission of specimen other than nasopharyngeal swab, presence of viral mutation(s) within the areas targeted by this assay, and inadequate number of viral copies(<138 copies/mL). A negative result must be combined with clinical observations, patient history, and epidemiological information. The expected result is Negative.  Fact Sheet for Patients:  bloggercourse.com  Fact Sheet for Healthcare Providers:  seriousbroker.it  This test is no t yet approved or cleared by the [place]  FDA and  has been authorized for detection and/or diagnosis of SARS-CoV-2 by FDA under an Emergency Use Authorization (EUA). This EUA will remain  in effect (meaning this test can be used) for the duration of the COVID-19 declaration under Section 564(b)(1) of the Act, 21 U.S.C.section 360bbb-3(b)(1), unless the authorization is terminated  or revoked sooner.       Influenza A by PCR POSITIVE (A) NEGATIVE Final   Influenza B by PCR NEGATIVE NEGATIVE Final    Comment: (NOTE) The Xpert Xpress  SARS-CoV-2/FLU/RSV plus assay is intended as an aid in the diagnosis of influenza from Nasopharyngeal swab specimens and should not be used as a sole basis for treatment. Nasal washings and aspirates are unacceptable for Xpert Xpress SARS-CoV-2/FLU/RSV testing.  Fact Sheet for Patients: bloggercourse.com  Fact Sheet for Healthcare Providers: seriousbroker.it  This test is not  yet approved or cleared by the [place]  FDA and has been authorized for detection and/or diagnosis of SARS-CoV-2 by FDA under an Emergency Use Authorization (EUA). This EUA will remain in effect (meaning this test can be used) for the duration of the COVID-19 declaration under Section 564(b)(1) of the Act, 21 U.S.C. section 360bbb-3(b)(1), unless the authorization is terminated or revoked.     Resp Syncytial Virus by PCR NEGATIVE NEGATIVE Final    Comment: (NOTE) Fact Sheet for Patients: bloggercourse.com  Fact Sheet for Healthcare Providers: seriousbroker.it  This test is not yet approved or cleared by the [place]  FDA and has been authorized for detection and/or diagnosis of SARS-CoV-2 by FDA under an Emergency Use Authorization (EUA). This EUA will remain in effect (meaning this test can be used) for the duration of the COVID-19 declaration under Section 564(b)(1) of the Act, 21 U.S.C. section 360bbb-3(b)(1), unless the authorization is terminated or revoked.  Performed at [facility], [address] [place], [place] [address]   Urine Culture     Status: Abnormal   Collection Time: 10/20/24  7:36 PM   Specimen: Urine, Random  Result Value Ref Range Status   Specimen Description   Final    URINE, RANDOM Performed at [facility], [address] [place], [place] [address]    Special Requests   Final    NONE Reflexed from [id number] Performed at [facility], [address] [place],  [place] [address]    Culture >=100,000 COLONIES/mL ESCHERICHIA COLI (A)  Final   Report Status 10/23/2024 FINAL  Final   Organism ID, Bacteria ESCHERICHIA COLI (A)  Final      Susceptibility   Escherichia coli - MIC*    AMPICILLIN 4 SENSITIVE Sensitive     CEFAZOLIN  (URINE) Value in next row Sensitive      2 SENSITIVEThis is a modified FDA-approved test that has been validated and its performance characteristics determined by the reporting laboratory.  This laboratory is certified under the Clinical Laboratory Improvement Amendments CLIA as qualified to perform high complexity clinical laboratory testing.    CEFEPIME  Value in next row Sensitive      2 SENSITIVEThis is a modified FDA-approved test that has been validated and its performance characteristics determined by the reporting laboratory.  This laboratory is certified under the Clinical Laboratory Improvement Amendments CLIA as qualified to perform high complexity clinical laboratory testing.    ERTAPENEM Value in next row Sensitive      2 SENSITIVEThis is a modified FDA-approved test that has been validated and its performance characteristics determined by the reporting laboratory.  This laboratory is certified under the Clinical Laboratory Improvement Amendments CLIA as qualified to perform high complexity clinical laboratory testing.    CEFTRIAXONE  Value in next row Sensitive      2 SENSITIVEThis is a modified FDA-approved test that has been validated and its performance characteristics determined by the reporting laboratory.  This laboratory is certified under the Clinical Laboratory Improvement Amendments CLIA as qualified to perform high complexity clinical laboratory testing.    CIPROFLOXACIN  Value in next row Sensitive      2 SENSITIVEThis is a modified FDA-approved test that has been validated and its performance characteristics determined by the reporting laboratory.  This laboratory is certified under the Clinical Laboratory Improvement  Amendments CLIA as qualified to perform high complexity clinical laboratory testing.    GENTAMICIN Value in next row Sensitive      2 SENSITIVEThis is a modified FDA-approved test that has  been validated and its performance characteristics determined by the reporting laboratory.  This laboratory is certified under the Clinical Laboratory Improvement Amendments CLIA as qualified to perform high complexity clinical laboratory testing.    NITROFURANTOIN Value in next row Sensitive      2 SENSITIVEThis is a modified FDA-approved test that has been validated and its performance characteristics determined by the reporting laboratory.  This laboratory is certified under the Clinical Laboratory Improvement Amendments CLIA as qualified to perform high complexity clinical laboratory testing.    TRIMETH /SULFA  Value in next row Sensitive      2 SENSITIVEThis is a modified FDA-approved test that has been validated and its performance characteristics determined by the reporting laboratory.  This laboratory is certified under the Clinical Laboratory Improvement Amendments CLIA as qualified to perform high complexity clinical laboratory testing.    AMPICILLIN/SULBACTAM Value in next row Sensitive      2 SENSITIVEThis is a modified FDA-approved test that has been validated and its performance characteristics determined by the reporting laboratory.  This laboratory is certified under the Clinical Laboratory Improvement Amendments CLIA as qualified to perform high complexity clinical laboratory testing.    PIP/TAZO Value in next row Sensitive      <=4 SENSITIVEThis is a modified FDA-approved test that has been validated and its performance characteristics determined by the reporting laboratory.  This laboratory is certified under the Clinical Laboratory Improvement Amendments CLIA as qualified to perform high complexity clinical laboratory testing.    MEROPENEM Value in next row Sensitive      <=4 SENSITIVEThis is a  modified FDA-approved test that has been validated and its performance characteristics determined by the reporting laboratory.  This laboratory is certified under the Clinical Laboratory Improvement Amendments CLIA as qualified to perform high complexity clinical laboratory testing.    * >=100,000 COLONIES/mL ESCHERICHIA COLI  MRSA Next Gen by PCR, Nasal     Status: None   Collection Time: 10/21/24  1:20 AM   Specimen: Nasal Mucosa; Nasal Swab  Result Value Ref Range Status   MRSA by PCR Next Gen NOT DETECTED NOT DETECTED Final    Comment: (NOTE) The GeneXpert MRSA Assay (FDA approved for NASAL specimens only), is one component of a comprehensive MRSA colonization surveillance program. It is not intended to diagnose MRSA infection nor to guide or monitor treatment for MRSA infections. Test performance is not FDA approved in patients less than 32 years old. Performed at [facility], [address], [place], [place] [address]   Culture, blood (routine x 2) Call MD if unable to obtain prior to antibiotics being given     Status: None   Collection Time: 10/21/24  3:16 AM   Specimen: BLOOD  Result Value Ref Range Status   Specimen Description BLOOD BLOOD LEFT ARM  Final   Special Requests   Final    BOTTLES DRAWN AEROBIC AND ANAEROBIC Blood Culture adequate volume   Culture   Final    NO GROWTH 5 DAYS Performed at [facility], [address], [place], [place] [address]    Report Status 10/26/2024 FINAL  Final  Culture, blood (routine x 2) Call MD if unable to obtain prior to antibiotics being given     Status: None   Collection Time: 10/21/24  3:17 AM   Specimen: BLOOD  Result Value Ref Range Status   Specimen Description BLOOD BLOOD LEFT HAND  Final   Special Requests AEROBIC BOTTLE ONLY Blood Culture adequate volume  Final   Culture  Final    NO GROWTH 5 DAYS Performed at [facility], [address] [place], [place] [address]    Report Status 10/26/2024 FINAL  Final     Time  coordinating discharge:  I have spent 35 minutes face to face with the patient and on the ward discussing the patients care, assessment, plan and disposition with other care givers. >50% of the time was devoted counseling the patient about the risks and benefits of treatment/Discharge disposition and coordinating care.   SIGNED:   Harlene RAYMOND Bowl, DO [facility] 10/29/2024, 11:12 AM   If 7PM-7AM, please contact night-coverage        [1]  Allergies Allergen Reactions   Lipitor [Atorvastatin ] Other (See Comments)    Constipation    Soma [Carisoprodol] Nausea And Vomiting

## 2024-10-29 NOTE — Plan of Care (Signed)
  Problem: Education: Goal: Knowledge of General Education information will improve Description: Including pain rating scale, medication(s)/side effects and non-pharmacologic comfort measures Outcome: Progressing   Problem: Clinical Measurements: Goal: Ability to maintain clinical measurements within normal limits will improve Outcome: Progressing Goal: Will remain free from infection Outcome: Progressing Goal: Diagnostic test results will improve Outcome: Progressing Goal: Respiratory complications will improve Outcome: Progressing Goal: Cardiovascular complication will be avoided Outcome: Progressing   Problem: Coping: Goal: Level of anxiety will decrease Outcome: Progressing   Problem: Elimination: Goal: Will not experience complications related to bowel motility Outcome: Progressing Goal: Will not experience complications related to urinary retention Outcome: Progressing   Problem: Safety: Goal: Ability to remain free from injury will improve Outcome: Progressing

## 2024-10-29 NOTE — Plan of Care (Signed)
" °  Problem: Education: Goal: Knowledge of General Education information will improve Description: Including pain rating scale, medication(s)/side effects and non-pharmacologic comfort measures Outcome: Progressing   Problem: Clinical Measurements: Goal: Ability to maintain clinical measurements within normal limits will improve Outcome: Progressing Goal: Respiratory complications will improve Outcome: Progressing   Problem: Activity: Goal: Risk for activity intolerance will decrease Outcome: Progressing   Problem: Safety: Goal: Ability to remain free from injury will improve Outcome: Progressing   Problem: Skin Integrity: Goal: Risk for impaired skin integrity will decrease Outcome: Progressing   Problem: Activity: Goal: Ability to tolerate increased activity will improve Outcome: Progressing   Problem: Metabolic: Goal: Ability to maintain appropriate glucose levels will improve Outcome: Progressing   "

## 2024-10-29 NOTE — Care Management Important Message (Signed)
 Important Message  Patient Details  Name: Jamie [name] MRN: [id number] Date of Birth: 01/23/1951   Important Message Given:  Yes - Medicare IM     Whitley Patchen L Roben Schliep 10/29/2024, 10:09 AM

## 2024-10-29 NOTE — TOC Progression Note (Addendum)
 Transition of Care [facility]) - Progression Note    Patient Details  Name: Jamie [name] MRN: [id number] Date of Birth: [birth year]  [facility] CM/SW Contact  Noreen KATHEE [place], [place] Phone Number: 10/29/2024, 3:16 PM  Clinical Narrative:     CSW reached out to [facility] to see if they had a bed for patient today and Marval stated that she did not unfortunately. CSW spoke with patient step daughter twice to discuss that [facility] does not have any beds and asked if bed search could be expanded and daughter said only to [place] , [place] nowhere else. Referral sent to [facility] and [place].   Addendum 3:54 PM   CSW called step daughter to let her know that [facility] does not have any open beds and asked if she wanted to try [facility]- Step daughter declined. Will cancel patient auth.   Expected Discharge Plan: Skilled Nursing Facility Barriers to Discharge: No SNF bed  Expected Discharge Plan and Services In-house Referral: Clinical Social Work Discharge Planning Services: NA Post Acute Care Choice: Home Health Living arrangements for the past 2 months: Single Family Home Expected Discharge Date: 10/28/24               DME Arranged: N/A DME Agency: NA       HH Arranged: RN, PT, OT, Nurse's Aide HH Agency: [facility]         Social Drivers of Health (SDOH) Interventions SDOH Screenings   Food Insecurity: Patient Unable To Answer (10/21/2024)  Housing: Unknown (10/21/2024)  Transportation Needs: Patient Unable To Answer (10/21/2024)  Utilities: Patient Unable To Answer (10/21/2024)  Alcohol Screen: Low Risk (12/30/2022)  Depression (PHQ2-9): Low Risk (07/13/2024)  Financial Resource Strain: Low Risk (12/30/2022)  Physical Activity: Insufficiently Active (12/30/2022)  Social Connections: Patient Unable To Answer (10/21/2024)  Stress: No Stress Concern Present (12/30/2022)  Tobacco Use: High Risk (10/25/2024)    Readmission Risk Interventions    10/29/2024    3:15 PM 10/28/2024    2:32 PM  10/25/2024    9:04 AM  Readmission Risk Prevention Plan  Transportation Screening Complete Complete Complete  Home Care Screening Complete    Medication Review (RN CM) Complete    HRI or Home Care Consult  Complete Complete  Social Work Consult for Recovery Care Planning/Counseling  Complete Complete  Palliative Care Screening  Complete Not Applicable  Medication Review Oceanographer)  Complete Complete

## 2024-10-30 DIAGNOSIS — I10 Essential (primary) hypertension: Secondary | ICD-10-CM | POA: Diagnosis not present

## 2024-10-30 DIAGNOSIS — N179 Acute kidney failure, unspecified: Secondary | ICD-10-CM | POA: Diagnosis not present

## 2024-10-30 DIAGNOSIS — J449 Chronic obstructive pulmonary disease, unspecified: Secondary | ICD-10-CM | POA: Diagnosis not present

## 2024-10-30 DIAGNOSIS — N390 Urinary tract infection, site not specified: Secondary | ICD-10-CM | POA: Diagnosis not present

## 2024-10-30 DIAGNOSIS — J188 Other pneumonia, unspecified organism: Secondary | ICD-10-CM | POA: Diagnosis not present

## 2024-10-30 DIAGNOSIS — J101 Influenza due to other identified influenza virus with other respiratory manifestations: Secondary | ICD-10-CM | POA: Diagnosis not present

## 2024-10-30 DIAGNOSIS — E785 Hyperlipidemia, unspecified: Secondary | ICD-10-CM | POA: Diagnosis not present

## 2024-10-30 DIAGNOSIS — E1169 Type 2 diabetes mellitus with other specified complication: Secondary | ICD-10-CM | POA: Diagnosis not present

## 2024-10-30 DIAGNOSIS — E43 Unspecified severe protein-calorie malnutrition: Secondary | ICD-10-CM | POA: Diagnosis not present

## 2024-10-30 DIAGNOSIS — R319 Hematuria, unspecified: Secondary | ICD-10-CM | POA: Diagnosis not present

## 2024-10-30 LAB — GLUCOSE, CAPILLARY
Glucose-Capillary: 124 mg/dL — ABNORMAL HIGH (ref 70–99)
Glucose-Capillary: 311 mg/dL — ABNORMAL HIGH (ref 70–99)
Glucose-Capillary: 397 mg/dL — ABNORMAL HIGH (ref 70–99)
Glucose-Capillary: 263 mg/dL — ABNORMAL HIGH (ref 70–99)

## 2024-10-30 NOTE — Plan of Care (Signed)
" °  Problem: Education: Goal: Knowledge of General Education information will improve Description: Including pain rating scale, medication(s)/side effects and non-pharmacologic comfort measures Outcome: Progressing   Problem: Clinical Measurements: Goal: Ability to maintain clinical measurements within normal limits will improve Outcome: Progressing Goal: Will remain free from infection Outcome: Progressing Goal: Diagnostic test results will improve Outcome: Progressing Goal: Respiratory complications will improve Outcome: Progressing Goal: Cardiovascular complication will be avoided Outcome: Progressing   Problem: Coping: Goal: Level of anxiety will decrease Outcome: Progressing   Problem: Pain Managment: Goal: General experience of comfort will improve and/or be controlled Outcome: Progressing   Problem: Safety: Goal: Ability to remain free from injury will improve Outcome: Progressing   Problem: Respiratory: Goal: Ability to maintain adequate ventilation will improve Outcome: Progressing Goal: Ability to maintain a clear airway will improve Outcome: Progressing   Problem: Education: Goal: Ability to describe self-care measures that may prevent or decrease complications (Diabetes Survival Skills Education) will improve Outcome: Progressing Goal: Individualized Educational Video(s) Outcome: Progressing   Problem: Nutritional: Goal: Maintenance of adequate nutrition will improve Outcome: Progressing Goal: Progress toward achieving an optimal weight will improve Outcome: Progressing   "

## 2024-10-30 NOTE — Progress Notes (Signed)
 [facility] PROGRESS NOTE   SHAYLEA UCCI (DOB: [birth year])       FMW:[id number] PCP: Lavell Bari LABOR, FNP   Brief Narrative: Jamie [name] is a 74 y.o. female with a history of dementia, HTN, HLD, T2DM, COPD who presented to the ED on 10/20/2024 with weakness and worsening confusion, found to have multifocal pneumonia and influenza A infection complicated by hypernatremia, AKI, and UTI in a larger context of failure to thrive. Palliative care has been consulted, defining goals of care with the family. Continued medical therapies are given with clinical improvement. Goal of care for acute conditions is curative, the patient remains full code, and her deconditioning will be addressed with short term rehabilitation at SNF. She is stable for discharge, though will not have a bed available until 1/15.   Subjective: Patient has remained intermittently confused; not requiring oxygen supplementation and in no acute distress.  Per nursing staff eating/drinking spontaneously and hemodynamically stable.  Objective: General exam: Alert, awake, able to follow simple commands. Respiratory system: Clear to auscultation.  Good saturation on room air; no using accessory muscle. Cardiovascular system: Rate controlled, no rubs, no gallops, no JVD. Gastrointestinal system: Abdomen is nondistended, soft and nontender. No organomegaly or masses felt. Normal bowel sounds heard. Central nervous system: Disoriented.  No focal neurological deficits.  Generally weak. Extremities: No C/C/E, +pedal pulses Skin: No petechiae. Psychiatry: Judgement and insight appear impaired secondary to underlying dementia; flat affect.  Assessment & Plan: Multifocal pneumonia, influenza A infection:  -Patient has completed treatment with Tamiflu  and oral antibiotics - Demonstrating good oxygen saturation on room air - Not demonstrating any distress and no using accessory muscles.   E. coli UTI:  - Antibiotic therapy has been  completed - No fever, no dysuria - Continue to maintain adequate hydration.   Hypernatremia, AKI:  - Stable and improved - Continue minimizing nephrotoxic agent - Maintain adequate hydration - Follow renal function trend intermittently.   COPD:  - Stable - No acute exacerbation currently appreciated - Continue bronchodilator management.   Dementia:  -Continue home medications. Within the constraints of inpatient evaluation, she appears to be at reported baseline.    Deconditioning: Will need rehabilitation to return to PLOF, which is within the family's goals of care for her. Awaiting SNF bed availability (reportedly will be 11/02/24).   Time: 35 minutes  Eric Nunnery MD [phone]

## 2024-10-31 DIAGNOSIS — J188 Other pneumonia, unspecified organism: Secondary | ICD-10-CM | POA: Diagnosis not present

## 2024-10-31 LAB — GLUCOSE, CAPILLARY
Glucose-Capillary: 158 mg/dL — ABNORMAL HIGH (ref 70–99)
Glucose-Capillary: 372 mg/dL — ABNORMAL HIGH (ref 70–99)
Glucose-Capillary: 411 mg/dL — ABNORMAL HIGH (ref 70–99)
Glucose-Capillary: 170 mg/dL — ABNORMAL HIGH (ref 70–99)

## 2024-10-31 NOTE — Progress Notes (Signed)
 [facility] PROGRESS NOTE   Jamie [name] (DOB: September 19, 1951)       FMW:[id number] PCP: Lavell Bari LABOR, FNP   Brief Narrative: DELIYAH MUCKLE is a 74 y.o. female with a history of dementia, HTN, HLD, T2DM, COPD who presented to the ED on 10/20/2024 with weakness and worsening confusion, found to have multifocal pneumonia and influenza A infection complicated by hypernatremia, AKI, and UTI in a larger context of failure to thrive. Palliative care has been consulted, defining goals of care with the family. Continued medical therapies are given with clinical improvement. Goal of care for acute conditions is curative, the patient remains full code, and her deconditioning will be addressed with short term rehabilitation at SNF. She is stable for discharge, though will not have a bed available until 1/15.   Subjective: No overnight events; chronically ill in appearance.  Patient is afebrile, no chest pain, no nausea, no vomiting.  Objective: General exam: In no acute distress; no overnight events.  Intermittently following commands appropriately.  No nausea, no vomiting, no requiring oxygen supplementation at rest. Respiratory system: No wheezing, no crackles, normal respiratory effort. Cardiovascular system: Rate controlled, no rubs, no gallops, no JVD. Gastrointestinal system: Abdomen is nondistended, soft and nontender. No organomegaly or masses felt. Normal bowel sounds heard. Central nervous system: Generally weak.  No focal neurological deficits. Extremities: No cyanosis or clubbing. Skin: No petechiae. Psychiatry: Judgement and insight appear impaired secondary to underlying dementia.   Flat affect.  Assessment & Plan: Multifocal pneumonia, influenza A infection:  -Patient has completed treatment with Tamiflu  and oral antibiotics - Demonstrating good oxygen saturation on room air - Not demonstrating any distress and no using accessory muscles.   E. coli UTI:  - Antibiotic therapy has  been completed - No fever, no dysuria - Continue to maintain adequate hydration.   Hypernatremia, AKI:  - Stable and improved - Continue minimizing nephrotoxic agent - Maintain adequate hydration - Follow renal function trend intermittently.   COPD:  - Stable - No acute exacerbation currently appreciated - Continue bronchodilator management.   Dementia:  -Continue home medications. Within the constraints of inpatient evaluation, she appears to be at reported baseline.   Type 2 diabetes mellitus - Continue to follow CBG fluctuation - Most recent A1c 7.7 - Continue sliding scale insulin  and Lantus  as ordered - Adjust hypoglycemic regimen as needed.   Deconditioning: Will need rehabilitation to return to PLOF, which is within the family's goals of care for her. Awaiting SNF bed availability (reportedly will be 11/02/24).   Time: 35 minutes  Eric Nunnery MD [phone]

## 2024-10-31 NOTE — Progress Notes (Signed)
 Blood sugar 411 this evening. Notified MD and was given orders to give the 9 units per sliding scale.

## 2024-11-01 DIAGNOSIS — E43 Unspecified severe protein-calorie malnutrition: Secondary | ICD-10-CM

## 2024-11-01 DIAGNOSIS — E785 Hyperlipidemia, unspecified: Secondary | ICD-10-CM

## 2024-11-01 DIAGNOSIS — J449 Chronic obstructive pulmonary disease, unspecified: Secondary | ICD-10-CM

## 2024-11-01 DIAGNOSIS — N390 Urinary tract infection, site not specified: Secondary | ICD-10-CM

## 2024-11-01 DIAGNOSIS — E1169 Type 2 diabetes mellitus with other specified complication: Secondary | ICD-10-CM

## 2024-11-01 DIAGNOSIS — N179 Acute kidney failure, unspecified: Secondary | ICD-10-CM | POA: Diagnosis not present

## 2024-11-01 DIAGNOSIS — R319 Hematuria, unspecified: Secondary | ICD-10-CM

## 2024-11-01 DIAGNOSIS — J188 Other pneumonia, unspecified organism: Secondary | ICD-10-CM | POA: Diagnosis not present

## 2024-11-01 DIAGNOSIS — J101 Influenza due to other identified influenza virus with other respiratory manifestations: Secondary | ICD-10-CM | POA: Diagnosis not present

## 2024-11-01 DIAGNOSIS — I10 Essential (primary) hypertension: Secondary | ICD-10-CM

## 2024-11-01 LAB — GLUCOSE, CAPILLARY
Glucose-Capillary: 108 mg/dL — ABNORMAL HIGH (ref 70–99)
Glucose-Capillary: 263 mg/dL — ABNORMAL HIGH (ref 70–99)

## 2024-11-01 NOTE — Plan of Care (Signed)
   Problem: Education: Goal: Knowledge of General Education information will improve Description: Including pain rating scale, medication(s)/side effects and non-pharmacologic comfort measures Outcome: Not Progressing   Problem: Health Behavior/Discharge Planning: Goal: Ability to manage health-related needs will improve Outcome: Not Progressing

## 2024-11-01 NOTE — Plan of Care (Signed)
" °  Problem: Clinical Measurements: Goal: Ability to maintain clinical measurements within normal limits will improve Outcome: Progressing Goal: Will remain free from infection Outcome: Progressing Goal: Diagnostic test results will improve Outcome: Progressing Goal: Respiratory complications will improve Outcome: Progressing Goal: Cardiovascular complication will be avoided Outcome: Progressing   Problem: Activity: Goal: Risk for activity intolerance will decrease Outcome: Progressing   Problem: Respiratory: Goal: Ability to maintain adequate ventilation will improve Outcome: Progressing Goal: Ability to maintain a clear airway will improve Outcome: Progressing   "

## 2024-11-01 NOTE — TOC Progression Note (Signed)
 Transition of Care [facility]) - Progression Note    Patient Details  Name: Jamie [name] MRN: [id number] Date of Birth: June 04, 1951  Transition of Care [facility]) CM/SW Contact  Noreen KATHEE [place], [place] Phone Number: 11/01/2024, 11:08 AM  Clinical Narrative:     Patient step daughter etta called CSW to check on bed availability this morning and CSW had not heard back from [facility] or [facility]. CSW updated step daughter and made her aware that once [facility] and [facility] get back with clinical research associate she will ne notified.   Debbie with [facility] stated that she did not have a bed today , but would get back in contact with CSW if something opens up this afternoon. [facility] did say that she would have a open bed on Wednesday . CSW then checked with [facility] and [facility] shared that they do have a bed today. CSW had to restart auth since it expired. Daughter was updated on bed for [facility] and restart of auth. CSW did request a new note from PT to send auth , just in case it is needed.   Expected Discharge Plan: Skilled Nursing Facility Barriers to Discharge: Insurance Authorization  Expected Discharge Plan and Services In-house Referral: Clinical Social Work Discharge Planning Services: NA Post Acute Care Choice: Home Health Living arrangements for the past 2 months: Single Family Home Expected Discharge Date: 11/01/24               DME Arranged: N/A DME Agency: NA       HH Arranged: RN, PT, OT, Nurse's Aide HH Agency: [facility]         Social Drivers of Health (SDOH) Interventions SDOH Screenings   Food Insecurity: Patient Unable To Answer (10/21/2024)  Housing: Unknown (10/21/2024)  Transportation Needs: Patient Unable To Answer (10/21/2024)  Utilities: Patient Unable To Answer (10/21/2024)  Alcohol Screen: Low Risk (12/30/2022)  Depression (PHQ2-9): Low Risk (07/13/2024)  Financial Resource Strain: Low Risk (12/30/2022)  Physical Activity: Insufficiently Active (12/30/2022)  Social Connections: Patient Unable To Answer  (10/21/2024)  Stress: No Stress Concern Present (12/30/2022)  Tobacco Use: High Risk (10/25/2024)    Readmission Risk Interventions    11/01/2024   11:00 AM 10/29/2024    3:15 PM 10/28/2024    2:32 PM  Readmission Risk Prevention Plan  Transportation Screening Complete Complete Complete  Home Care Screening Complete Complete   Medication Review (RN CM) Complete Complete   HRI or Home Care Consult   Complete  Social Work Consult for Recovery Care Planning/Counseling   Complete  Palliative Care Screening   Complete  Medication Review Oceanographer)   Complete

## 2024-11-01 NOTE — TOC Transition Note (Signed)
 Transition of Care [facility]) - Discharge Note   Patient Details  Name: [name] MRN: [id number] Date of Birth: Dec 09, 1950  [facility]) CM/SW Contact:  Noreen KATHEE Cle[place]ilde [place] Phone Number: 11/01/2024, 3:33 PM   Clinical Narrative:     Patient Jamie [name] came back from insurance. Donald provided room and report number . Patient step daughter was made aware and nurse was given room and report number. Med necessity completed and EMS has been called. CSW signing off  Final next level of care: Skilled Nursing Facility Barriers to Discharge: Barriers Resolved   Patient Goals and CMS Choice Patient states their goals for this hospitalization and ongoing recovery are:: DC to facility for rehab CMS Medicare.gov Compare Post Acute Care list provided to:: Patient Represenative (must comment) (Step daughter GLENWOOD [name]) Choice offered to / list presented to : Adult Children, Spouse [place] ownership interest in [facility].provided to:: Adult Children    Discharge Placement              Patient chooses bed at: [facility] Patient to be transferred to facility by: [facility] Name of family member notified: [name]- Step daughter Patient and family notified of of transfer: 11/01/24  Discharge Plan and Services Additional resources added to the After Visit Summary for   In-house Referral: Clinical Social Work Discharge Planning Services: NA Post Acute Care Choice: Home Health          DME Arranged: N/A DME Agency: NA       HH Arranged: RN, PT, OT, Nurse's Aide HH Agency: [facility]        Social Drivers of Health (SDOH) Interventions SDOH Screenings   Food Insecurity: Patient Unable To Answer (10/21/2024)  Housing: Unknown (10/21/2024)  Transportation Needs: Patient Unable To Answer (10/21/2024)  Utilities: Patient Unable To Answer (10/21/2024)  Alcohol Screen: Low Risk (12/30/2022)  Depression (PHQ2-9): Low Risk (07/13/2024)  Financial Resource  Strain: Low Risk (12/30/2022)  Physical Activity: Insufficiently Active (12/30/2022)  Social Connections: Patient Unable To Answer (10/21/2024)  Stress: No Stress Concern Present (12/30/2022)  Tobacco Use: High Risk (10/25/2024)     Readmission Risk Interventions    11/01/2024   11:00 AM 10/29/2024    3:15 PM 10/28/2024    2:32 PM  Readmission Risk Prevention Plan  Transportation Screening Complete Complete Complete  Home Care Screening Complete Complete   Medication Review (RN CM) Complete Complete   HRI or Home Care Consult   Complete  Social Work Consult for Recovery Care Planning/Counseling   Complete  Palliative Care Screening   Complete  Medication Review Oceanographer)   Complete

## 2024-11-01 NOTE — Discharge Summary (Signed)
 " Physician Discharge Summary   Patient: Jamie [name] MRN: [id number] DOB: 14-Jun-1951  Admit date:     10/20/2024  Discharge date: 11/01/24  Discharge Physician: Eric Nunnery   PCP: Lavell Bari LABOR, FNP   Recommendations for Outpatient Follow-up:  Follow up with PCP in 1-2 weeks Please obtain BMP/CBC to follow electrolytes, renal function and hemoglobin trend/stability. Recommend outpatient follow-up with palliative care services for further goals of care discussion and advance care planning. Continue to reassess CBG fluctuation/A1c with further adjustment to hypoglycemic regimen as needed.       Brief Narrative:    74 y.o. female with medical history significant of hypertension, advanced dementia, hyperlipidemia, T2DM, COPD, dementia who presents to the emergency department from home via EMS due to generalized weakness and confusion.  Upon admission patient was found to have multifocal community-acquired pneumonia secondary to influenza A infection complicated by dehydration, hypernatremia, AKI, UTI and failure to thrive. Eventually she started slowly improving.  Due to her advanced dementia and comorbidities, had palliative care discussion and patient and family opted for to continue medical treatment.  PT OT recommended SNF  Discharge Diagnoses: Principal Problem:   Multifocal pneumonia Active Problems:   Protein-calorie malnutrition, severe   Assessment & Plan:   Multifocal bacterial community-acquired pneumonia, POA Influenza A  Improving.  finished. Augmentin .  Completed 5 days of Tamiflu .  Continue home bronchodilators.   Hypernatremia,POA Acute kidney injury with metabolic acidosis Adult failure to thrive with severe malnutrition - Advised oral hydration at home.  Recommended to recheck BMP outpatient in next 5 to 7 days.  Antihypertensive losartan  may need to be adjusted depending on renal function and her oral intake -Unfortunately due to advanced dementia her p.o.  intake remains poor.  Encourage p.o. nutrition - Palliative care team to continue following outpatient    UTI: -Treated with antibiotics - Maintain adequate hydration    Type 2 diabetes mellitus with hyperglycemia, POA:  -A1c on 07/13/2024 was 7.7%.   -Resume home hypoglycemic regimen.   COPD, POA:  - no current exacerbation.  Continue with albuterol  and Breztri . - hasn't had any albuterol  b/c not aware enough to ask for it.  Lungs sound good, no evidence of exacerbation currently.    Alzheimer's dementia, POA: Continue with Aricept  and Namenda  - Continue supportive care and assistance.   Hyperlipidemia, POA:  -Continue with statin   Hypertension, POA:  -Overall stable and well-controlled - Continue current antihypertensive agents - Follow heart healthy diet and maintain adequate hydration.  Severe protein calorie malnutrition - Continue feeding supplement -Body mass index is 15.28 kg/m.    Consultants: Palliative care Procedures performed: See below for x-ray reports. Disposition: Skilled nursing facility (short-term rehab). Diet recommendation: Heart healthy diet.  DISCHARGE MEDICATION: Allergies as of 11/01/2024       Reactions   Lipitor [atorvastatin ] Other (See Comments)   Constipation    Soma [carisoprodol] Nausea And Vomiting        Medication List     STOP taking these medications    risperiDONE  0.5 MG tablet Commonly known as: RISPERDAL        TAKE these medications    albuterol  (2.5 MG/3ML) 0.083% nebulizer solution Commonly known as: PROVENTIL  Take 3 mLs (2.5 mg total) by nebulization every 6 (six) hours as needed for wheezing or shortness of breath.   amLODipine  10 MG tablet Commonly known as: NORVASC  Take 1 tablet (10 mg total) by mouth daily.   aspirin  EC 81 MG tablet Take 1 tablet (81  mg total) by mouth daily with breakfast. Swallow whole.   donepezil  10 MG tablet Commonly known as: ARICEPT  Take 1 tablet (10 mg total) by mouth at  bedtime.   Glucose Instant Energy 4-6 GM-MG Chew chewable tablet Generic drug: glucose-Vitamin C Chew 1 tablet by mouth as needed for low blood sugar.   Lantus  SoloStar 100 UNIT/ML Solostar Pen Generic drug: insulin  glargine Inject 18 Units into the skin at bedtime.   losartan  100 MG tablet Commonly known as: COZAAR  Take 1 tablet (100 mg total) by mouth daily.   memantine  10 MG tablet Commonly known as: NAMENDA  Take 1 tablet (10 mg total) by mouth 2 (two) times daily.   pravastatin  40 MG tablet Commonly known as: PRAVACHOL  Take 1 tablet (40 mg total) by mouth daily.   Trelegy Ellipta  100-62.5-25 MCG/ACT Aepb Generic drug: Fluticasone -Umeclidin-Vilant One click each am        Contact information for after-discharge care     Home Medical Care     [facility]) .   Service: Home Health Services Contact information: [address] [place]  [address] [phone]                    Discharge Exam: Fredricka Weights   10/20/24 1913 10/21/24 0122  Weight: 39.9 kg 35.5 kg    General exam: In no acute distress; no overnight events.  Intermittently following commands appropriately.  No nausea, no vomiting, no requiring oxygen supplementation at rest. Respiratory system: No wheezing, no crackles, normal respiratory effort. Cardiovascular system: Rate controlled, no rubs, no gallops, no JVD. Gastrointestinal system: Abdomen is nondistended, soft and nontender. No organomegaly or masses felt. Normal bowel sounds heard. Central nervous system: Generally weak.  No focal neurological deficits. Extremities: No cyanosis or clubbing. Skin: No petechiae. Psychiatry: Judgement and insight appear impaired secondary to underlying dementia.   Flat affect.  Condition at discharge: Stable and improved.  The results of significant diagnostics from this hospitalization (including imaging, microbiology, ancillary and  laboratory) are listed below for reference.   Imaging Studies: CT Cervical Spine Wo Contrast Result Date: 10/20/2024 EXAM: CT CERVICAL SPINE WITHOUT CONTRAST 10/20/2024 08:45:38 PM TECHNIQUE: CT of the cervical spine was performed without the administration of intravenous contrast. Multiplanar reformatted images are provided for review. Automated exposure control, iterative reconstruction, and/or weight based adjustment of the mA/kV was utilized to reduce the radiation dose to as low as reasonably achievable. COMPARISON: 11/23/2023 CLINICAL HISTORY: Neck trauma (Age >= 65y) FINDINGS: BONES AND ALIGNMENT: No acute fracture or traumatic malalignment. DEGENERATIVE CHANGES: Prior ACDF at C6-C7. Diffuse degenerative disc and facet disease. SOFT TISSUES: No prevertebral soft tissue swelling. IMPRESSION: 1. No acute findings. 2. Postoperative and degenerative changes. Electronically signed by: Franky Crease MD MD 10/20/2024 09:08 PM EST RP Workstation: [id number]   CT Head Wo Contrast Result Date: 10/20/2024 EXAM: CT HEAD WITHOUT CONTRAST 10/20/2024 08:45:38 PM TECHNIQUE: CT of the head was performed without the administration of intravenous contrast. Automated exposure control, iterative reconstruction, and/or weight based adjustment of the mA/kV was utilized to reduce the radiation dose to as low as reasonably achievable. COMPARISON: 11/23/2023 CLINICAL HISTORY: Mental status change, unknown cause. FINDINGS: BRAIN AND VENTRICLES: No acute hemorrhage. No evidence of acute infarct. No hydrocephalus. No extra-axial collection. No mass effect or midline shift. Atrophy and chronic small vessel disease throughout the deep white matter. ORBITS: No acute abnormality. SINUSES: No acute abnormality. SOFT TISSUES AND SKULL: No acute soft tissue  abnormality. No skull fracture. IMPRESSION: 1. No acute intracranial abnormality. 2. Atrophy, chronic small vessel disease. Electronically signed by: Franky Crease MD MD 10/20/2024 09:06 PM  EST RP Workstation: [id number]   CT CHEST ABDOMEN PELVIS WO CONTRAST Result Date: 10/20/2024 EXAM: CT CHEST, ABDOMEN AND PELVIS WITHOUT CONTRAST 10/20/2024 08:45:38 PM TECHNIQUE: CT of the chest, abdomen and pelvis was performed without the administration of intravenous contrast. Multiplanar reformatted images are provided for review. Automated exposure control, iterative reconstruction, and/or weight based adjustment of the mA/kV was utilized to reduce the radiation dose to as low as reasonably achievable. COMPARISON: Chest radiograph 10/20/2024, CT abdomen and pelvis 12/31/2014, CT chest 08/25/2013, and CT thoracolumbar spine 11/23/2023. CLINICAL HISTORY: Polytrauma, blunt. Weakness and altered mental status. FINDINGS: CHEST: MEDIASTINUM AND LYMPH NODES: Heart and pericardium are unremarkable. Normal heart size. No pericardial effusion. The central airways are clear. No mediastinal, hilar or axillary lymphadenopathy. The thyroid  gland is unremarkable. The esophagus is decompressed. Calcification of the aorta and coronary arteries. No aortic aneurysm. LUNGS AND PLEURA: Emphysematous changes diffusely throughout the lungs. Bronchiectasis with bronchial wall thickening. Multiple nodular peribronchial infiltrates bilaterally. These changes are likely to represent chronic bronchitis with acute multifocal bronchopneumonia versus aspiration. No pleural effusion or pneumothorax. ABDOMEN AND PELVIS: LIMITATIONS/ARTIFACTS: Streak artifact from arms and hardware-limited evaluation of the abdomen and pelvis. LIVER: As visualized, the liver is unremarkable. GALLBLADDER AND BILE DUCTS: As visualized, the gallbladder is unremarkable. No biliary ductal dilatation. SPLEEN: As visualized, the spleen is unremarkable. PANCREAS: As visualized, the pancreas is unremarkable. ADRENAL GLANDS: As visualized, the adrenal glands are unremarkable. KIDNEYS, URETERS AND BLADDER: As visualized, the kidneys and ureters are unremarkable. No  stones in the kidneys or ureters. No hydronephrosis. No perinephric or periureteral stranding. Gas in the bladder. This could arise from catheterization if performed recently or from infection. Correlate with urinalysis. GI AND BOWEL: Stomach, small bowel, and the colon are mostly decompressed. There is no bowel obstruction. REPRODUCTIVE ORGANS: As visualized, the uterus and ovaries are unremarkable. PERITONEUM AND RETROPERITONEUM: No ascites. No free air. VASCULATURE: Aorta is normal in caliber. Dense calcification of the abdominal aorta. No aneurysm. ABDOMINAL AND PELVIS LYMPH NODES: No lymphadenopathy. BONES AND SOFT TISSUES: Postoperative posterior rod and screw fixation from T12 through L4. Anterior fixation of the cervical spine. Degenerative changes throughout the lumbar spine and cervical spine with narrowed interspaces and endplate osteophyte formation. Visualized surgical hardware appears intact. No acute bony abnormalities. Infiltration in the anterior abdominal wall subcutaneous fat consistent with injection sites. Circumscribed cystic lesion in the left pelvic subcutaneous fat measuring 2.2 cm in diameter, likely a sebaceous cyst. Calcified phleboliths in the pelvis. IMPRESSION: 1. No evidence of acute traumatic injury. 2. Multifocal bronchopneumonia versus aspiration. Chronic bronchitic changes with bronchiectasis. 3. Gas in the urinary bladder, which may be related to recent catheterization or infection; recommend urinalysis. Electronically signed by: Elsie Gravely MD 10/20/2024 08:56 PM EST RP Workstation: [id number]   DG Chest Port 1 View Result Date: 10/20/2024 EXAM: 1 VIEW(S) XRAY OF THE CHEST 10/20/2024 07:52:00 PM COMPARISON: CT of the chest 08/25/2013. CLINICAL HISTORY: Questionable sepsis - evaluate for abnormality. FINDINGS: LUNGS AND PLEURA: Lungs are hyperinflated, likely related to emphysema. A small nodular density projects over the left lower lung measuring 5 mm which is  indeterminate. No pleural effusion. No pneumothorax. HEART AND MEDIASTINUM: Atherosclerotic calcifications of the aorta. No acute abnormality of the cardiac and mediastinal silhouettes. BONES AND SOFT TISSUES: Thoracic and cervical spinal fusion hardware present. No acute osseous abnormality.  IMPRESSION: 1. No acute findings. 2. Hyperinflated lungs, likely related to emphysema. 3. Small indeterminate 5 mm nodular density projecting over the left lower lung. Follow-up chest CT recommended. Electronically signed by: Greig Pique MD MD 10/20/2024 07:59 PM EST RP Workstation: [id number]    Microbiology: Results for orders placed or performed during the hospital encounter of 10/20/24  Resp panel by RT-PCR (RSV, Flu A&B, Covid) Anterior Nasal Swab     Status: Abnormal   Collection Time: 10/20/24  7:36 PM   Specimen: Anterior Nasal Swab  Result Value Ref Range Status   SARS Coronavirus 2 by RT PCR NEGATIVE NEGATIVE Final    Comment: (NOTE) SARS-CoV-2 target nucleic acids are NOT DETECTED.  The SARS-CoV-2 RNA is generally detectable in upper respiratory specimens during the acute phase of infection. The lowest concentration of SARS-CoV-2 viral copies this assay can detect is 138 copies/mL. A negative result does not preclude SARS-Cov-2 infection and should not be used as the sole basis for treatment or other patient management decisions. A negative result may occur with  improper specimen collection/handling, submission of specimen other than nasopharyngeal swab, presence of viral mutation(s) within the areas targeted by this assay, and inadequate number of viral copies(<138 copies/mL). A negative result must be combined with clinical observations, patient history, and epidemiological information. The expected result is Negative.  Fact Sheet for Patients:  bloggercourse.com  Fact Sheet for Healthcare Providers:  seriousbroker.it  This test is  no t yet approved or cleared by the [place]  FDA and  has been authorized for detection and/or diagnosis of SARS-CoV-2 by FDA under an Emergency Use Authorization (EUA). This EUA will remain  in effect (meaning this test can be used) for the duration of the COVID-19 declaration under Section 564(b)(1) of the Act, 21 U.S.C.section 360bbb-3(b)(1), unless the authorization is terminated  or revoked sooner.       Influenza A by PCR POSITIVE (A) NEGATIVE Final   Influenza B by PCR NEGATIVE NEGATIVE Final    Comment: (NOTE) The Xpert Xpress SARS-CoV-2/FLU/RSV plus assay is intended as an aid in the diagnosis of influenza from Nasopharyngeal swab specimens and should not be used as a sole basis for treatment. Nasal washings and aspirates are unacceptable for Xpert Xpress SARS-CoV-2/FLU/RSV testing.  Fact Sheet for Patients: bloggercourse.com  Fact Sheet for Healthcare Providers: seriousbroker.it  This test is not yet approved or cleared by the [place]  FDA and has been authorized for detection and/or diagnosis of SARS-CoV-2 by FDA under an Emergency Use Authorization (EUA). This EUA will remain in effect (meaning this test can be used) for the duration of the COVID-19 declaration under Section 564(b)(1) of the Act, 21 U.S.C. section 360bbb-3(b)(1), unless the authorization is terminated or revoked.     Resp Syncytial Virus by PCR NEGATIVE NEGATIVE Final    Comment: (NOTE) Fact Sheet for Patients: bloggercourse.com  Fact Sheet for Healthcare Providers: seriousbroker.it  This test is not yet approved or cleared by the [place]  FDA and has been authorized for detection and/or diagnosis of SARS-CoV-2 by FDA under an Emergency Use Authorization (EUA). This EUA will remain in effect (meaning this test can be used) for the duration of the COVID-19 declaration under Section  564(b)(1) of the Act, 21 U.S.C. section 360bbb-3(b)(1), unless the authorization is terminated or revoked.  Performed at [facility], [address], [place], [place] [address]   Urine Culture     Status: Abnormal   Collection Time: 10/20/24  7:36 PM   Specimen: Urine,  Random  Result Value Ref Range Status   Specimen Description   Final    URINE, RANDOM Performed at [facility], [address], [place], [place] [address]    Special Requests   Final    NONE Reflexed from [id number] Performed at [facility], [address], [place], [place] [address]    Culture >=100,000 COLONIES/mL ESCHERICHIA COLI (A)  Final   Report Status 10/23/2024 FINAL  Final   Organism ID, Bacteria ESCHERICHIA COLI (A)  Final      Susceptibility   Escherichia coli - MIC*    AMPICILLIN 4 SENSITIVE Sensitive     CEFAZOLIN  (URINE) Value in next row Sensitive      2 SENSITIVEThis is a modified FDA-approved test that has been validated and its performance characteristics determined by the reporting laboratory.  This laboratory is certified under the Clinical Laboratory Improvement Amendments CLIA as qualified to perform high complexity clinical laboratory testing.    CEFEPIME  Value in next row Sensitive      2 SENSITIVEThis is a modified FDA-approved test that has been validated and its performance characteristics determined by the reporting laboratory.  This laboratory is certified under the Clinical Laboratory Improvement Amendments CLIA as qualified to perform high complexity clinical laboratory testing.    ERTAPENEM Value in next row Sensitive      2 SENSITIVEThis is a modified FDA-approved test that has been validated and its performance characteristics determined by the reporting laboratory.  This laboratory is certified under the Clinical Laboratory Improvement Amendments CLIA as qualified to perform high complexity clinical laboratory testing.    CEFTRIAXONE  Value in next row Sensitive      2 SENSITIVEThis is a  modified FDA-approved test that has been validated and its performance characteristics determined by the reporting laboratory.  This laboratory is certified under the Clinical Laboratory Improvement Amendments CLIA as qualified to perform high complexity clinical laboratory testing.    CIPROFLOXACIN  Value in next row Sensitive      2 SENSITIVEThis is a modified FDA-approved test that has been validated and its performance characteristics determined by the reporting laboratory.  This laboratory is certified under the Clinical Laboratory Improvement Amendments CLIA as qualified to perform high complexity clinical laboratory testing.    GENTAMICIN Value in next row Sensitive      2 SENSITIVEThis is a modified FDA-approved test that has been validated and its performance characteristics determined by the reporting laboratory.  This laboratory is certified under the Clinical Laboratory Improvement Amendments CLIA as qualified to perform high complexity clinical laboratory testing.    NITROFURANTOIN Value in next row Sensitive      2 SENSITIVEThis is a modified FDA-approved test that has been validated and its performance characteristics determined by the reporting laboratory.  This laboratory is certified under the Clinical Laboratory Improvement Amendments CLIA as qualified to perform high complexity clinical laboratory testing.    TRIMETH /SULFA  Value in next row Sensitive      2 SENSITIVEThis is a modified FDA-approved test that has been validated and its performance characteristics determined by the reporting laboratory.  This laboratory is certified under the Clinical Laboratory Improvement Amendments CLIA as qualified to perform high complexity clinical laboratory testing.    AMPICILLIN/SULBACTAM Value in next row Sensitive      2 SENSITIVEThis is a modified FDA-approved test that has been validated and its performance characteristics determined by the reporting laboratory.  This laboratory is certified  under the Clinical Laboratory Improvement Amendments CLIA as qualified to perform high complexity  clinical laboratory testing.    PIP/TAZO Value in next row Sensitive      <=4 SENSITIVEThis is a modified FDA-approved test that has been validated and its performance characteristics determined by the reporting laboratory.  This laboratory is certified under the Clinical Laboratory Improvement Amendments CLIA as qualified to perform high complexity clinical laboratory testing.    MEROPENEM Value in next row Sensitive      <=4 SENSITIVEThis is a modified FDA-approved test that has been validated and its performance characteristics determined by the reporting laboratory.  This laboratory is certified under the Clinical Laboratory Improvement Amendments CLIA as qualified to perform high complexity clinical laboratory testing.    * >=100,000 COLONIES/mL ESCHERICHIA COLI  MRSA Next Gen by PCR, Nasal     Status: None   Collection Time: 10/21/24  1:20 AM   Specimen: Nasal Mucosa; Nasal Swab  Result Value Ref Range Status   MRSA by PCR Next Gen NOT DETECTED NOT DETECTED Final    Comment: (NOTE) The GeneXpert MRSA Assay (FDA approved for NASAL specimens only), is one component of a comprehensive MRSA colonization surveillance program. It is not intended to diagnose MRSA infection nor to guide or monitor treatment for MRSA infections. Test performance is not FDA approved in patients less than 38 years old. Performed at [facility], [address] [place], [place] [address]   Culture, blood (routine x 2) Call MD if unable to obtain prior to antibiotics being given     Status: None   Collection Time: 10/21/24  3:16 AM   Specimen: BLOOD  Result Value Ref Range Status   Specimen Description BLOOD BLOOD LEFT ARM  Final   Special Requests   Final    BOTTLES DRAWN AEROBIC AND ANAEROBIC Blood Culture adequate volume   Culture   Final    NO GROWTH 5 DAYS Performed at [facility], [address]  [place], [place] [address]    Report Status 10/26/2024 FINAL  Final  Culture, blood (routine x 2) Call MD if unable to obtain prior to antibiotics being given     Status: None   Collection Time: 10/21/24  3:17 AM   Specimen: BLOOD  Result Value Ref Range Status   Specimen Description BLOOD BLOOD LEFT HAND  Final   Special Requests AEROBIC BOTTLE ONLY Blood Culture adequate volume  Final   Culture   Final    NO GROWTH 5 DAYS Performed at [facility], [address], [place], [place] [address]    Report Status 10/26/2024 FINAL  Final    Labs: CBC: Recent Labs  Lab 10/26/24 0555  WBC 8.1  HGB 11.5*  HCT 35.9*  MCV 83.5  PLT 227   Basic Metabolic Panel: Recent Labs  Lab 10/26/24 0555  NA 139  K 4.1  CL 104  CO2 27  GLUCOSE 202*  BUN 18  CREATININE 1.17*  CALCIUM  8.4*  MG 1.9  PHOS 2.5   CBG: Recent Labs  Lab 10/31/24 0733 10/31/24 1111 10/31/24 1645 10/31/24 1948 11/01/24 0706  GLUCAP 158* 372* 411* 170* 108*    Discharge time spent:  35 minutes.  Signed: Eric Nunnery, MD [facility] 11/01/2024 "

## 2024-11-01 NOTE — Plan of Care (Signed)
" °  Problem: Acute Rehab PT Goals(only PT should resolve) Goal: Pt Will Go Supine/Side To Sit Outcome: Progressing Flowsheets (Taken 11/01/2024 1225) Pt will go Supine/Side to Sit: with contact guard assist Goal: Patient Will Transfer Sit To/From Stand Outcome: Progressing Flowsheets (Taken 11/01/2024 1225) Patient will transfer sit to/from stand: with contact guard assist Goal: Pt Will Transfer Bed To Chair/Chair To Bed Outcome: Progressing Flowsheets (Taken 11/01/2024 1225) Pt will Transfer Bed to Chair/Chair to Bed:  with contact guard assist  with min assist Goal: Pt Will Ambulate Outcome: Progressing Flowsheets (Taken 11/01/2024 1225) Pt will Ambulate:  25 feet  with minimal assist  with rolling walker   12:25 PM, 11/01/24 Lynwood Music, MPT Physical Therapist with [facility] [phone] office [phone] mobile phone  "

## 2024-11-01 NOTE — Progress Notes (Signed)
 Physical Therapy Treatment Patient Details Name: Jamie [name] MRN: [id number] DOB: 1950-12-25 Today's Date: 11/01/2024   History of Present Illness Jamie [name] is a 74 y.o. female with medical history significant of hypertension, hyperlipidemia, T2DM, COPD, dementia who presents to the emergency department from home via EMS due to generalized weakness.  She lives with her husband (her provider) who was recently admitted to the hospital due to flu and just discharged home.  Daughter came to visit and noted that patient was weak and was more confused than usual, so she activated EMS, on arrival of EMS team, she was noted to be hypotensive, this later self- improved with IV hydration was provided PTA.    PT Comments  Patient agreeable for therapy. Patient demonstrates labored movement for sitting up at bedside, very unsteady on feet during transfer to chair and limited to a few steps at bedside due to c/o fatigue, weakness and poor standing balance. Patient tolerated sitting up in chair briefly before requesting to go back to bed due to fatigue. Patient will benefit from continued skilled physical therapy in hospital and recommended venue below to increase strength, balance, endurance for safe ADLs and gait.      If plan is discharge home, recommend the following: A lot of help with bathing/dressing/bathroom;A lot of help with walking and/or transfers;Help with stairs or ramp for entrance;Assist for transportation;Assistance with cooking/housework   Can travel by private vehicle     No  Equipment Recommendations  None recommended by PT    Recommendations for Other Services       Precautions / Restrictions Precautions Precautions: Fall Recall of Precautions/Restrictions: Impaired Restrictions Weight Bearing Restrictions Per Provider Order: No     Mobility  Bed Mobility Overal bed mobility: Needs Assistance Bed Mobility: Supine to Sit     Supine to sit: Min assist, Contact guard      General bed mobility comments: labored movement, increased time    Transfers Overall transfer level: Needs assistance Equipment used: Rolling walker (2 wheels), 1 person hand held assist Transfers: Sit to/from Stand, Bed to chair/wheelchair/BSC Sit to Stand: Mod assist   Step pivot transfers: Mod assist       General transfer comment: unsteady labored movement requring HHA  and leaning on armrest of chair during transfer without AD, safer using RW    Ambulation/Gait Ambulation/Gait assistance: Mod assist Gait Distance (Feet): 6 Feet Assistive device: Rolling walker (2 wheels) Gait Pattern/deviations: Decreased step length - right, Decreased step length - left, Decreased stride length, Trunk flexed Gait velocity: slow     General Gait Details: limited to a few slow labored unsteady steps at bedside with flexed trunk due to weakness and c/o fatigue   Stairs             Wheelchair Mobility     Tilt Bed    Modified Rankin (Stroke Patients Only)       Balance Overall balance assessment: Needs assistance Sitting-balance support: Feet supported, No upper extremity supported Sitting balance-Leahy Scale: Fair Sitting balance - Comments: seated at EOB   Standing balance support: During functional activity, Bilateral upper extremity supported, Reliant on assistive device for balance Standing balance-Leahy Scale: Poor Standing balance comment: using RW                            Communication Communication Communication: No apparent difficulties  Cognition Arousal: Alert Behavior During Therapy: Anxious  Following commands: Impaired Following commands impaired: Follows one step commands with increased time, Follows one step commands inconsistently    Cueing Cueing Techniques: Verbal cues, Tactile cues, Visual cues  Exercises      General Comments        Pertinent Vitals/Pain Pain Assessment Pain  Assessment: No/denies pain    Home Living                          Prior Function            PT Goals (current goals can now be found in the care plan section) Acute Rehab PT Goals Patient Stated Goal: return home PT Goal Formulation: With patient Potential to Achieve Goals: Good Progress towards PT goals: Progressing toward goals    Frequency    Min 3X/week      PT Plan      Co-evaluation              AM-PAC PT 6 Clicks Mobility   Outcome Measure  Help needed turning from your back to your side while in a flat bed without using bedrails?: None Help needed moving from lying on your back to sitting on the side of a flat bed without using bedrails?: A Little Help needed moving to and from a bed to a chair (including a wheelchair)?: A Lot Help needed standing up from a chair using your arms (e.g., wheelchair or bedside chair)?: A Lot Help needed to walk in hospital room?: A Lot Help needed climbing 3-5 steps with a railing? : A Lot 6 Click Score: 15    End of Session   Activity Tolerance: Patient tolerated treatment well;Patient limited by fatigue Patient left: in bed;with call bell/phone within reach;with bed alarm set Nurse Communication: Mobility status PT Visit Diagnosis: Unsteadiness on feet (R26.81);Repeated falls (R29.6);Muscle weakness (generalized) (M62.81)     Time: 8854-8793 PT Time Calculation (min) (ACUTE ONLY): 21 min  Charges:    $Therapeutic Activity: 8-22 mins PT General Charges $$ ACUTE PT VISIT: 1 Visit                     12:22 PM, 11/01/24 Lynwood Music, MPT Physical Therapist with [facility] 336 [phone] office [phone] mobile phone

## 2024-11-01 NOTE — Progress Notes (Signed)
 Mobility Specialist Progress Note:    11/01/24 1050  Mobility  Activity Pivoted/transferred to/from [facility]  Level of Assistance Minimal assist, patient does 75% or more  Assistive Device None  Distance Ambulated (ft) 4 ft  Range of Motion/Exercises Active;All extremities  Activity Response Tolerated well  Mobility Referral Yes  Mobility visit 1 Mobility  Mobility Specialist Start Time (ACUTE ONLY) 1050  Mobility Specialist Stop Time (ACUTE ONLY) 1107  Mobility Specialist Time Calculation (min) (ACUTE ONLY) 17 min   Pt received in bed, requesting assistance to [facility]. Required MinA to stand and transfer with no AD. Tolerated well, confused. Returned supine, alarm on. All needs met.  Derric Dealmeida Mobility Specialist Please contact via Special Educational Needs Teacher or  Rehab office at [phone]

## 2024-11-01 NOTE — Progress Notes (Signed)
 Palliative: Chart review completed.  Face-to-face discussion with transition of care team. Jamie [name] is to discharge to [facility] even today for short-term rehab with ultimate goal of returning home.  No needs at this time.  No charge Jamie Birkenhead, NP Palliative medicine team Team phone [phone]
